# Patient Record
Sex: Male | Born: 1960 | Race: Black or African American | Hispanic: No | Marital: Single | Smoking: Current every day smoker
Health system: Southern US, Community
[De-identification: ages and names within clinical notes are randomized; demographics above are authoritative.]

## PROBLEM LIST (undated history)

## (undated) DIAGNOSIS — G4733 Obstructive sleep apnea (adult) (pediatric): Secondary | ICD-10-CM

## (undated) DIAGNOSIS — B1920 Unspecified viral hepatitis C without hepatic coma: Secondary | ICD-10-CM

## (undated) DIAGNOSIS — D696 Thrombocytopenia, unspecified: Secondary | ICD-10-CM

## (undated) DIAGNOSIS — E872 Acidosis, unspecified: Secondary | ICD-10-CM

## (undated) DIAGNOSIS — J189 Pneumonia, unspecified organism: Secondary | ICD-10-CM

## (undated) DIAGNOSIS — Z720 Tobacco use: Secondary | ICD-10-CM

## (undated) DIAGNOSIS — K8020 Calculus of gallbladder without cholecystitis without obstruction: Secondary | ICD-10-CM

## (undated) DIAGNOSIS — N183 Chronic kidney disease, stage 3 unspecified: Secondary | ICD-10-CM

## (undated) DIAGNOSIS — E119 Type 2 diabetes mellitus without complications: Secondary | ICD-10-CM

## (undated) DIAGNOSIS — K862 Cyst of pancreas: Secondary | ICD-10-CM

## (undated) DIAGNOSIS — B20 Human immunodeficiency virus [HIV] disease: Secondary | ICD-10-CM

## (undated) DIAGNOSIS — J449 Chronic obstructive pulmonary disease, unspecified: Secondary | ICD-10-CM

## (undated) DIAGNOSIS — E1140 Type 2 diabetes mellitus with diabetic neuropathy, unspecified: Secondary | ICD-10-CM

## (undated) DIAGNOSIS — Z789 Other specified health status: Secondary | ICD-10-CM

## (undated) DIAGNOSIS — G939 Disorder of brain, unspecified: Secondary | ICD-10-CM

## (undated) DIAGNOSIS — R4182 Altered mental status, unspecified: Secondary | ICD-10-CM

## (undated) DIAGNOSIS — R2681 Unsteadiness on feet: Secondary | ICD-10-CM

## (undated) DIAGNOSIS — D490 Neoplasm of unspecified behavior of digestive system: Secondary | ICD-10-CM

## (undated) DIAGNOSIS — R972 Elevated prostate specific antigen [PSA]: Secondary | ICD-10-CM

## (undated) DIAGNOSIS — F1491 Cocaine use, unspecified, in remission: Secondary | ICD-10-CM

## (undated) DIAGNOSIS — Z87898 Personal history of other specified conditions: Secondary | ICD-10-CM

## (undated) DIAGNOSIS — E1110 Type 2 diabetes mellitus with ketoacidosis without coma: Secondary | ICD-10-CM

## (undated) DIAGNOSIS — Z978 Presence of other specified devices: Secondary | ICD-10-CM

## (undated) DIAGNOSIS — N179 Acute kidney failure, unspecified: Secondary | ICD-10-CM

## (undated) DIAGNOSIS — I10 Essential (primary) hypertension: Secondary | ICD-10-CM

## (undated) DIAGNOSIS — Z21 Asymptomatic human immunodeficiency virus [HIV] infection status: Secondary | ICD-10-CM

## (undated) DIAGNOSIS — D649 Anemia, unspecified: Secondary | ICD-10-CM

## (undated) HISTORY — PX: NO PAST SURGERIES: SHX2092

---

## 1997-06-11 ENCOUNTER — Encounter: Admission: RE | Admit: 1997-06-11 | Discharge: 1997-06-11 | Payer: Self-pay | Admitting: Infectious Diseases

## 1997-08-02 ENCOUNTER — Encounter: Admission: RE | Admit: 1997-08-02 | Discharge: 1997-08-02 | Payer: Self-pay | Admitting: Internal Medicine

## 1997-10-19 ENCOUNTER — Encounter: Admission: RE | Admit: 1997-10-19 | Discharge: 1997-10-19 | Payer: Self-pay | Admitting: Internal Medicine

## 1998-05-30 ENCOUNTER — Ambulatory Visit (HOSPITAL_COMMUNITY): Admission: RE | Admit: 1998-05-30 | Discharge: 1998-05-30 | Payer: Self-pay | Admitting: Internal Medicine

## 1998-07-11 ENCOUNTER — Encounter: Admission: RE | Admit: 1998-07-11 | Discharge: 1998-07-11 | Payer: Self-pay | Admitting: Internal Medicine

## 1998-08-08 ENCOUNTER — Encounter: Admission: RE | Admit: 1998-08-08 | Discharge: 1998-08-08 | Payer: Self-pay | Admitting: Internal Medicine

## 1998-08-17 ENCOUNTER — Encounter: Admission: RE | Admit: 1998-08-17 | Discharge: 1998-08-17 | Payer: Self-pay | Admitting: Infectious Diseases

## 1998-08-24 ENCOUNTER — Encounter: Admission: RE | Admit: 1998-08-24 | Discharge: 1998-08-24 | Payer: Self-pay | Admitting: Internal Medicine

## 1998-09-19 ENCOUNTER — Encounter: Admission: RE | Admit: 1998-09-19 | Discharge: 1998-09-19 | Payer: Self-pay | Admitting: Internal Medicine

## 1998-09-19 ENCOUNTER — Ambulatory Visit (HOSPITAL_COMMUNITY): Admission: RE | Admit: 1998-09-19 | Discharge: 1998-09-19 | Payer: Self-pay | Admitting: Internal Medicine

## 1998-10-03 ENCOUNTER — Encounter: Admission: RE | Admit: 1998-10-03 | Discharge: 1998-10-03 | Payer: Self-pay | Admitting: Internal Medicine

## 1998-11-14 ENCOUNTER — Ambulatory Visit (HOSPITAL_COMMUNITY): Admission: RE | Admit: 1998-11-14 | Discharge: 1998-11-14 | Payer: Self-pay | Admitting: Internal Medicine

## 1998-11-14 ENCOUNTER — Encounter: Admission: RE | Admit: 1998-11-14 | Discharge: 1998-11-14 | Payer: Self-pay | Admitting: Internal Medicine

## 1999-03-06 ENCOUNTER — Ambulatory Visit (HOSPITAL_COMMUNITY): Admission: RE | Admit: 1999-03-06 | Discharge: 1999-03-06 | Payer: Self-pay | Admitting: Internal Medicine

## 1999-03-06 ENCOUNTER — Encounter: Admission: RE | Admit: 1999-03-06 | Discharge: 1999-03-06 | Payer: Self-pay | Admitting: Internal Medicine

## 1999-05-22 ENCOUNTER — Ambulatory Visit (HOSPITAL_COMMUNITY): Admission: RE | Admit: 1999-05-22 | Discharge: 1999-05-22 | Payer: Self-pay | Admitting: Internal Medicine

## 1999-05-22 ENCOUNTER — Encounter: Admission: RE | Admit: 1999-05-22 | Discharge: 1999-05-22 | Payer: Self-pay | Admitting: Internal Medicine

## 1999-06-05 ENCOUNTER — Encounter: Admission: RE | Admit: 1999-06-05 | Discharge: 1999-06-05 | Payer: Self-pay | Admitting: Internal Medicine

## 1999-06-27 ENCOUNTER — Encounter: Admission: RE | Admit: 1999-06-27 | Discharge: 1999-06-27 | Payer: Self-pay | Admitting: Internal Medicine

## 1999-08-28 ENCOUNTER — Encounter: Admission: RE | Admit: 1999-08-28 | Discharge: 1999-08-28 | Payer: Self-pay | Admitting: Internal Medicine

## 2002-06-09 ENCOUNTER — Encounter: Admission: RE | Admit: 2002-06-09 | Discharge: 2002-06-09 | Payer: Self-pay | Admitting: Infectious Diseases

## 2002-06-09 ENCOUNTER — Encounter: Payer: Self-pay | Admitting: Infectious Diseases

## 2002-09-22 ENCOUNTER — Encounter: Payer: Self-pay | Admitting: Infectious Diseases

## 2002-09-22 ENCOUNTER — Encounter: Admission: RE | Admit: 2002-09-22 | Discharge: 2002-09-22 | Payer: Self-pay | Admitting: Infectious Diseases

## 2002-09-22 ENCOUNTER — Ambulatory Visit (HOSPITAL_COMMUNITY): Admission: RE | Admit: 2002-09-22 | Discharge: 2002-09-22 | Payer: Self-pay | Admitting: Infectious Diseases

## 2006-04-05 ENCOUNTER — Ambulatory Visit (HOSPITAL_COMMUNITY): Admission: RE | Admit: 2006-04-05 | Discharge: 2006-04-05 | Payer: Self-pay | Admitting: Family Medicine

## 2008-12-06 ENCOUNTER — Ambulatory Visit: Payer: Self-pay | Admitting: Urology

## 2008-12-21 ENCOUNTER — Ambulatory Visit: Payer: Self-pay | Admitting: Specialist

## 2009-05-21 ENCOUNTER — Emergency Department (HOSPITAL_COMMUNITY): Admission: EM | Admit: 2009-05-21 | Discharge: 2009-05-21 | Payer: Self-pay | Admitting: Emergency Medicine

## 2009-07-18 ENCOUNTER — Encounter (HOSPITAL_COMMUNITY): Admission: RE | Admit: 2009-07-18 | Discharge: 2009-08-17 | Payer: Self-pay | Admitting: Orthopaedic Surgery

## 2011-10-05 DIAGNOSIS — B1920 Unspecified viral hepatitis C without hepatic coma: Secondary | ICD-10-CM | POA: Insufficient documentation

## 2012-01-20 DIAGNOSIS — R4182 Altered mental status, unspecified: Secondary | ICD-10-CM

## 2012-01-20 HISTORY — DX: Altered mental status, unspecified: R41.82

## 2012-01-22 ENCOUNTER — Emergency Department (HOSPITAL_COMMUNITY): Payer: Medicaid Other

## 2012-01-22 ENCOUNTER — Inpatient Hospital Stay (HOSPITAL_COMMUNITY)
Admission: EM | Admit: 2012-01-22 | Discharge: 2012-01-29 | DRG: 974 | Disposition: A | Discharge status: Home Health | Payer: Medicaid Other | Attending: Internal Medicine | Admitting: Internal Medicine

## 2012-01-22 ENCOUNTER — Encounter (HOSPITAL_COMMUNITY): Payer: Self-pay

## 2012-01-22 DIAGNOSIS — R29898 Other symptoms and signs involving the musculoskeletal system: Secondary | ICD-10-CM | POA: Diagnosis present

## 2012-01-22 DIAGNOSIS — J189 Pneumonia, unspecified organism: Principal | ICD-10-CM | POA: Diagnosis present

## 2012-01-22 DIAGNOSIS — G9389 Other specified disorders of brain: Secondary | ICD-10-CM | POA: Diagnosis present

## 2012-01-22 DIAGNOSIS — Z91199 Patient's noncompliance with other medical treatment and regimen due to unspecified reason: Secondary | ICD-10-CM

## 2012-01-22 DIAGNOSIS — G9340 Encephalopathy, unspecified: Secondary | ICD-10-CM | POA: Diagnosis present

## 2012-01-22 DIAGNOSIS — Z9119 Patient's noncompliance with other medical treatment and regimen: Secondary | ICD-10-CM

## 2012-01-22 DIAGNOSIS — B20 Human immunodeficiency virus [HIV] disease: Secondary | ICD-10-CM | POA: Diagnosis present

## 2012-01-22 DIAGNOSIS — N179 Acute kidney failure, unspecified: Secondary | ICD-10-CM | POA: Diagnosis present

## 2012-01-22 DIAGNOSIS — N189 Chronic kidney disease, unspecified: Secondary | ICD-10-CM | POA: Diagnosis present

## 2012-01-22 DIAGNOSIS — E876 Hypokalemia: Secondary | ICD-10-CM | POA: Diagnosis present

## 2012-01-22 DIAGNOSIS — D649 Anemia, unspecified: Secondary | ICD-10-CM | POA: Diagnosis present

## 2012-01-22 DIAGNOSIS — R4182 Altered mental status, unspecified: Secondary | ICD-10-CM | POA: Diagnosis present

## 2012-01-22 DIAGNOSIS — E872 Acidosis, unspecified: Secondary | ICD-10-CM

## 2012-01-22 DIAGNOSIS — B351 Tinea unguium: Secondary | ICD-10-CM | POA: Diagnosis present

## 2012-01-22 DIAGNOSIS — Z21 Asymptomatic human immunodeficiency virus [HIV] infection status: Secondary | ICD-10-CM | POA: Diagnosis present

## 2012-01-22 DIAGNOSIS — E43 Unspecified severe protein-calorie malnutrition: Secondary | ICD-10-CM | POA: Diagnosis present

## 2012-01-22 DIAGNOSIS — G939 Disorder of brain, unspecified: Secondary | ICD-10-CM | POA: Diagnosis present

## 2012-01-22 DIAGNOSIS — Z681 Body mass index (BMI) 19 or less, adult: Secondary | ICD-10-CM

## 2012-01-22 HISTORY — DX: Human immunodeficiency virus (HIV) disease: B20

## 2012-01-22 HISTORY — DX: Asymptomatic human immunodeficiency virus (hiv) infection status: Z21

## 2012-01-22 HISTORY — DX: Altered mental status, unspecified: R41.82

## 2012-01-22 HISTORY — DX: Acute kidney failure, unspecified: N17.9

## 2012-01-22 LAB — COMPREHENSIVE METABOLIC PANEL
ALT: 24 U/L (ref 0–53)
AST: 50 U/L — ABNORMAL HIGH (ref 0–37)
Alkaline Phosphatase: 65 U/L (ref 39–117)
Chloride: 110 mEq/L (ref 96–112)
GFR calc non Af Amer: 47 mL/min — ABNORMAL LOW (ref >90)
Glucose, Bld: 93 mg/dL (ref 70–99)
Potassium: 3.6 mEq/L (ref 3.5–5.1)
Total Bilirubin: 0.1 mg/dL — ABNORMAL LOW (ref 0.3–1.2)

## 2012-01-22 LAB — POCT I-STAT, CHEM 8
BUN: 29 mg/dL — ABNORMAL HIGH (ref 6–23)
Calcium, Ion: 1.15 mmol/L (ref 1.12–1.23)
Chloride: 113 mEq/L — ABNORMAL HIGH (ref 96–112)
Glucose, Bld: 87 mg/dL (ref 70–99)
HCT: 27 % — ABNORMAL LOW (ref 39.0–52.0)
Potassium: 3.6 mEq/L (ref 3.5–5.1)
Sodium: 142 mEq/L (ref 135–145)
TCO2: 19 mmol/L (ref 0–100)

## 2012-01-22 LAB — CBC
HCT: 27.6 % — ABNORMAL LOW (ref 39.0–52.0)
Hemoglobin: 9.4 g/dL — ABNORMAL LOW (ref 13.0–17.0)
MCH: 29.8 pg (ref 26.0–34.0)
MCV: 87.6 fL (ref 78.0–100.0)
Platelets: 125 10*3/uL — ABNORMAL LOW (ref 150–400)
RBC: 3.15 MIL/uL — ABNORMAL LOW (ref 4.22–5.81)
WBC: 2.9 10*3/uL — ABNORMAL LOW (ref 4.0–10.5)

## 2012-01-22 LAB — DIFFERENTIAL
Basophils Absolute: 0 10*3/uL (ref 0.0–0.1)
Eosinophils Absolute: 0 10*3/uL (ref 0.0–0.7)
Eosinophils Relative: 0 % (ref 0–5)
Neutro Abs: 1.5 10*3/uL — ABNORMAL LOW (ref 1.7–7.7)
Neutrophils Relative %: 52 % (ref 43–77)

## 2012-01-22 LAB — RAPID URINE DRUG SCREEN, HOSP PERFORMED
Barbiturates: NOT DETECTED
Benzodiazepines: NOT DETECTED
Cocaine: NOT DETECTED
Opiates: NOT DETECTED

## 2012-01-22 LAB — LACTIC ACID, PLASMA: Lactic Acid, Venous: 1.3 mmol/L (ref 0.5–2.2)

## 2012-01-22 LAB — GLUCOSE, CAPILLARY: Glucose-Capillary: 84 mg/dL (ref 70–99)

## 2012-01-22 MED ORDER — SODIUM CHLORIDE 0.9 % IJ SOLN
3.0000 mL | Freq: Two times a day (BID) | INTRAMUSCULAR | Status: DC
  Administered 2012-01-22 – 2012-01-29 (×7): 3 mL via INTRAVENOUS

## 2012-01-22 MED ORDER — SODIUM CHLORIDE 0.9 % IV SOLN: INTRAVENOUS | Status: DC

## 2012-01-22 MED ORDER — SODIUM CHLORIDE 0.9 % IV SOLN
INTRAVENOUS | Status: AC
  Administered 2012-01-22: 23:00:00 via INTRAVENOUS

## 2012-01-22 MED ORDER — SULFAMETHOXAZOLE-TRIMETHOPRIM 400-80 MG/5ML IV SOLN
340.0000 mg | Freq: Three times a day (TID) | INTRAVENOUS | Status: DC
  Administered 2012-01-23 – 2012-01-24 (×4): 340 mg via INTRAVENOUS
  Filled 2012-01-22 (×9): qty 21.3

## 2012-01-22 MED ORDER — LABETALOL HCL 5 MG/ML IV SOLN
5.0000 mg | INTRAVENOUS | Status: DC | PRN
  Administered 2012-01-23 – 2012-01-24 (×2): 5 mg via INTRAVENOUS
  Filled 2012-01-22 (×2): qty 4

## 2012-01-22 MED ORDER — CLONIDINE HCL 0.1 MG PO TABS
0.1000 mg | ORAL_TABLET | Freq: Once | ORAL | Status: AC
  Administered 2012-01-22: 0.1 mg via ORAL
  Filled 2012-01-22: qty 1

## 2012-01-22 MED ORDER — LEVOFLOXACIN IN D5W 750 MG/150ML IV SOLN
750.0000 mg | INTRAVENOUS | Status: DC
  Administered 2012-01-22 – 2012-01-24 (×2): 750 mg via INTRAVENOUS
  Filled 2012-01-22 (×3): qty 150

## 2012-01-22 MED ORDER — EFAVIRENZ-EMTRICITAB-TENOFOVIR 600-200-300 MG PO TABS
1.0000 | ORAL_TABLET | Freq: Every day | ORAL | Status: DC
  Administered 2012-01-22: 1 via ORAL
  Filled 2012-01-22 (×2): qty 1

## 2012-01-22 NOTE — ED Notes (Signed)
Sister states when she checked on her brother Sunday after church he was confused and his mouth was drawn. Also, complain of pain in right ankle today without injury

## 2012-01-22 NOTE — ED Notes (Signed)
[facility] called and received report, ETA of 10-15 minutes

## 2012-01-22 NOTE — ED Notes (Signed)
I called [facility] with bed assignment ([id number]).  Will send a truck.

## 2012-01-22 NOTE — ED Notes (Signed)
Patient transported to X-ray 

## 2012-01-22 NOTE — ED Notes (Signed)
Pt's sister at bedside, states another sister reported that pt had been confused and mouth drawn to side after church on Sunday

## 2012-01-22 NOTE — Progress Notes (Signed)
Spoke with Dr. Ignacia Palma at [facility] ED regarding transfer of this patient to [facility].  51 year old male with a history of HIV, noncompliant with medications presents with two-day history of worsening leg weakness, right greater than left. The patient's sister was with the patient and claims that the patient is confused. However, the EDP stated that patient was awake, alert, and able to provide a history.  CT brain without contrast showed bilateral thalamic, basal ganglia and periventricular hypodensities. There was also focal area of decreased attenuation in the mid pons region. The EDP consulted with a teleneurologist whom advised admission for MRI brain and neurology consult and possible ID consult.  The teleneurologist did not feel that an LP was imminently necessary at this time.  Patient was alert and also able to speak with teleneurologist.  Patient was afebrile and hemodynamically stable. In fact, patient was hypertensive with a blood pressure 179/107. Chest x-ray showed bronchitic type changes with mild bilateral perihilar infiltrates.  No abx given.  Patient did not have dyspnea or hypoxemia. EKG showed sinus rhythm. Patient was accepted for telemetry bed.  DTat

## 2012-01-22 NOTE — Progress Notes (Signed)
Patient arrived via [facility] from [facility]. Patient resting comfortably in room.  MD paged and notified of patient's arrival to unit. Nolon Nations

## 2012-01-22 NOTE — H&P (Signed)
Chief Complaint:  ams  HPI: 51 yo male hiv positive bought to [employer] ed for ams by his sister.  Pt mental status normal now.  He denies any fevers.  Has been having nonprod cough for several days.  No rashes.  No n/v/d.  He is complaining of mild le ankle edema that waxes/wanes but is resolved now.  No significant calf swelling.  No cp.  No sob.  Says he takes his hiv meds and is compliant but there is some mention elsewhere in chart that this is not the case.  Transferred here to ED with ct head showing multiple hypodense lesions and cxr with bilateral infiltrates.    Review of Systems:  O/w neg  Past Medical History: Past Medical History  Diagnosis Date  . HIV (human immunodeficiency virus infection)    History reviewed. No pertinent past surgical history.  Medications: Prior to Admission medications   Medication Sig Start Date End Date Taking? Authorizing Provider  efavirenz-emtricitabine-tenofovir (ATRIPLA) 600-200-300 MG per tablet Take 1 tablet by mouth at bedtime.   Yes Historical Provider, MD    Allergies:  No Known Allergies  Social History:  reports that he has been smoking.  He does not have any smokeless tobacco history on file. He reports that he uses illicit drugs (Cocaine). He reports that he does not drink alcohol.  Family History: History reviewed. No pertinent family history.  Physical Exam: Filed Vitals:   01/22/12 1704 01/22/12 1730 01/22/12 1745 01/22/12 1856  BP: 190/113  169/104 174/107  Pulse: 80 80 81 74  Temp:    97.7 F (36.5 C)  TempSrc:    Oral  Resp:    14  Height:      Weight:      SpO2: 100% 100% 100% 100%   General appearance: alert, cooperative and no distress Neck: no JVD and supple, symmetrical, trachea midline Lungs: clear to auscultation bilaterally Heart: regular rate and rhythm, S1, S2 normal, no murmur, click, rub or gallop Abdomen: soft, non-tender; bowel sounds normal; no masses,  no organomegaly Extremities: extremities  normal, atraumatic, no cyanosis or edema Pulses: 2+ and symmetric Skin: Skin color, texture, turgor normal. No rashes or lesions Neurologic: Grossly normal    Labs on Admission:   [facility] 01/22/12 1406 01/22/12 1335  NA 142 136  K 3.6 3.6  CL 113* 110  CO2 -- 21  GLUCOSE 87 93  BUN 29* 28*  CREATININE 1.60* 1.66*  CALCIUM -- 7.8*  MG -- --  PHOS -- --    Basename 01/22/12 1335  AST 50*  ALT 24  ALKPHOS 65  BILITOT 0.1*  PROT 7.9  ALBUMIN 1.6*     Basename 01/22/12 1335  CKTOTAL --  CKMB --  CKMBINDEX --  TROPONINI <0.30    Radiological Exams on Admission: Dg Chest 2 View  01/22/2012  *RADIOLOGY REPORT*  Clinical Data: Altered mental status, right leg weakness, history HIV  CHEST - 2 VIEW  Comparison: None  Findings: Borderline enlargement of cardiac silhouette. Mediastinal contours normal. Peribronchial thickening with suspected mild perihilar infiltrates. No segmental consolidation or pneumothorax. Tiny bibasilar effusions. Bones unremarkable.  IMPRESSION: Bronchitic changes with suspected mild bilateral perihilar infiltrates.   Original Report Authenticated By: Ulyses Southward, M.D.    Ct Head Wo Contrast  01/22/2012  *RADIOLOGY REPORT*  Clinical Data: Confusion.  Altered mental status. Symptoms began Sunday.  HIV positive.  Right leg weakness.  CT HEAD WITHOUT CONTRAST  Technique:  Contiguous axial images were obtained  from the base of the skull through the vertex without contrast.  Comparison: None.  Findings: There is no evidence for acute infarction, intracranial hemorrhage,   hydrocephalus, or extra-axial fluid. Premature atrophy affects the cerebral hemispheres and cerebellar white matter.  Hypodensities are noted in the bilateral thalami, left greater than right basal ganglia, and periventricular white matter. Cryptococcal infection of the brain can be associated with a somewhat similar pattern, although multiple lacunar infarcts are certainly a more common  occurrence in non-HIV patients.  There is a rectangular like area of decreased attenuation in the mid pons of uncertain significance.  Osmotic demyelination syndrome could explain this finding in the appropriate clinical setting versus an inflammatory, infectious, or ischemic etiology.    If there are no contraindications, MRI brain without and with contrast could be helpful in further evaluation.  Calvarium intact.  Clear sinuses and mastoids.  Dense lenticular opacities without visible thickening of the retina/choroid.  IMPRESSION: Bilateral thalamic, basal ganglia, and periventricular hypodensities in this HIV positive patient; opportunistic infection not excluded.  Focal area of decreased attenuation in the mid pons, also of uncertain significance.  See discussion above.  Premature cerebral and cerebellar atrophy.   Original Report Authenticated By: Davonna Belling, M.D.     Assessment/Plan  51 yo male hiv + with pna and multiple brain lesions  Principal Problem:  *PNA (pneumonia) Active Problems:  HIV (human immunodeficiency virus infection)  Brain lesion  Acute renal failure  Anemia  Altered mental status  Spoke to ID dr Orvan Falconer and will place pt on iv septra and levaquin tonight.  Will ck ua and get one set of blood cultures.  Obtain mri of brain to better look at lesions.  ivf overnight and repeat cr in am no prior baseline.  Obtain cd4 count and hiv viral load.  Anemia panel also.  Vss.  Id will see in am.  Mental status is clear now and no focal neurological deficits.  Full code.     Felisa Zechman A 01/22/2012, 9:15 PM

## 2012-01-22 NOTE — ED Provider Notes (Signed)
History   This chart was scribed for Carleene Cooper III, MD by Sofie Rower, ED Scribe. The patient was seen in room [facility] and the patient's care was started at 1:09PM.     CSN: [id number]  Arrival date & time 01/22/12  1221   First MD Initiated Contact with Patient 01/22/12 1309      Chief Complaint  Patient presents with  . Altered Mental Status    (Consider location/radiation/quality/duration/timing/severity/associated sxs/prior treatment) The history is provided by the patient and a relative. No language interpreter was used.    Jay Flores is a 51 y.o. male , with a hx of HIV, who presents to the Emergency Department complaining of sudden, progressively worsening, altered mental status, onset two days ago (01/20/12).  Associated symptoms include ankle pain located at the right ankle. The pt reports he has been having trouble ambulating and experiencing sudden episodes of his right leg and right ankle giving out from under him. The pt usually uses a walker to assist with his ambulation at home. The pt's relative informs she is concerned to due to the possibility of the pt having experienced a stroke. The pt reports he has experienced similar symptoms in the past, when he was diagnosed with HIV. The pt does have Medications to manage his HIV, however, he has been abstaining from taking his scheduled dosage.   The pt denies rash, difficulty urinating, and dysuria. Furthermore, the pt denies any pertinent surgical hx and allergies to medications.   The pt is a current everyday smoker, however, he does not drink alcohol. The pt does use cocaine.   Past Medical History  Diagnosis Date  . HIV (human immunodeficiency virus infection)     History reviewed. No pertinent past surgical history.  No family history on file.  History  Substance Use Topics  . Smoking status: Current Every Day Smoker  . Smokeless tobacco: Not on file  . Alcohol Use: No      Review of Systems   Genitourinary: Negative for dysuria and difficulty urinating.  Skin: Negative for rash.  Psychiatric/Behavioral: Positive for confusion.  All other systems reviewed and are negative.    Allergies  Review of patient's allergies indicates no known allergies.  Home Medications   Current Outpatient Rx  Name  Route  Sig  Dispense  Refill  . EFAVIRENZ-EMTRICITAB-TENOFOVIR 600-200-300 MG PO TABS   Oral   Take 1 tablet by mouth at bedtime.           BP 174/103  Pulse 88  Temp 97.6 F (36.4 C) (Oral)  Resp 21  Ht 5\' 11"  (1.803 m)  Wt 147 lb (66.679 kg)  BMI 20.50 kg/m2  SpO2 100%  Physical Exam  Nursing note and vitals reviewed. Constitutional: He is oriented to person, place, and time. He appears well-developed and well-nourished. No distress.       Emaciated.   HENT:  Head: Normocephalic and atraumatic.  Right Ear: Tympanic membrane and external ear normal.  Left Ear: Tympanic membrane and external ear normal.  Nose: Nose normal.  Eyes: Conjunctivae normal and EOM are normal. Pupils are equal, round, and reactive to light.  Neck: Normal range of motion. Neck supple. No tracheal deviation present.  Cardiovascular: Normal rate, regular rhythm and normal heart sounds.   Pulmonary/Chest: Effort normal and breath sounds normal. No respiratory distress.  Abdominal: Soft. Bowel sounds are normal. He exhibits no distension and no mass. There is no tenderness.  Musculoskeletal: Normal range of motion. He  exhibits no edema.       Onychomycosis of all toe nails, severe. Pt has bilateral, symmetric weakness in the lower extremities.   Neurological: He is alert and oriented to person, place, and time.  Skin: Skin is warm and dry.  Psychiatric: He has a normal mood and affect. His behavior is normal.    ED Course  Procedures (including critical care time)  DIAGNOSTIC STUDIES: Oxygen Saturation is 100% on room air, normal by my interpretation.    COORDINATION OF CARE:  1:34  PM- Treatment plan discussed with patient. Pt agrees with treatment.  1:52 PM- Recheck. Treatment plan concerning evaluation by neurologist discussed with patients sister. Pt's sister agrees with treatment.   2:24 PM Spoke to teleneurogist about Jay Flores.  2:54 PM  Results for orders placed during the hospital encounter of 01/22/12  PROTIME-INR      Component Value Range   Prothrombin Time 12.8  11.6 - 15.2 seconds   INR 0.97  0.00 - 1.49  APTT      Component Value Range   aPTT 43 (*) 24 - 37 seconds  CBC      Component Value Range   WBC 2.9 (*) 4.0 - 10.5 K/uL   RBC 3.15 (*) 4.22 - 5.81 MIL/uL   Hemoglobin 9.4 (*) 13.0 - 17.0 g/dL   HCT 16.1 (*) 09.6 - 04.5 %   MCV 87.6  78.0 - 100.0 fL   MCH 29.8  26.0 - 34.0 pg   MCHC 34.1  30.0 - 36.0 g/dL   RDW 40.9  81.1 - 91.4 %   Platelets 125 (*) 150 - 400 K/uL  DIFFERENTIAL      Component Value Range   Neutrophils Relative 52  43 - 77 %   Neutro Abs 1.5 (*) 1.7 - 7.7 K/uL   Lymphocytes Relative 37  12 - 46 %   Lymphs Abs 1.1  0.7 - 4.0 K/uL   Monocytes Relative 11  3 - 12 %   Monocytes Absolute 0.3  0.1 - 1.0 K/uL   Eosinophils Relative 0  0 - 5 %   Eosinophils Absolute 0.0  0.0 - 0.7 K/uL   Basophils Relative 0  0 - 1 %   Basophils Absolute 0.0  0.0 - 0.1 K/uL  COMPREHENSIVE METABOLIC PANEL      Component Value Range   Sodium 136  135 - 145 mEq/L   Potassium 3.6  3.5 - 5.1 mEq/L   Chloride 110  96 - 112 mEq/L   CO2 21  19 - 32 mEq/L   Glucose, Bld 93  70 - 99 mg/dL   BUN 28 (*) 6 - 23 mg/dL   Creatinine, Ser 7.82 (*) 0.50 - 1.35 mg/dL   Calcium 7.8 (*) 8.4 - 10.5 mg/dL   Total Protein 7.9  6.0 - 8.3 g/dL   Albumin 1.6 (*) 3.5 - 5.2 g/dL   AST 50 (*) 0 - 37 U/L   ALT 24  0 - 53 U/L   Alkaline Phosphatase 65  39 - 117 U/L   Total Bilirubin 0.1 (*) 0.3 - 1.2 mg/dL   GFR calc non Af Amer 47 (*) >90 mL/min   GFR calc Af Amer 54 (*) >90 mL/min  TROPONIN I      Component Value Range   Troponin I <0.30  <0.30 ng/mL   POCT I-STAT, CHEM 8      Component Value Range   Sodium 142  135 - 145 mEq/L  Potassium 3.6  3.5 - 5.1 mEq/L   Chloride 113 (*) 96 - 112 mEq/L   BUN 29 (*) 6 - 23 mg/dL   Creatinine, Ser 1.61 (*) 0.50 - 1.35 mg/dL   Glucose, Bld 87  70 - 99 mg/dL   Calcium, Ion 0.96  0.45 - 1.23 mmol/L   TCO2 19  0 - 100 mmol/L   Hemoglobin 9.2 (*) 13.0 - 17.0 g/dL   HCT 40.9 (*) 81.1 - 91.4 %  GLUCOSE, CAPILLARY      Component Value Range   Glucose-Capillary 84  70 - 99 mg/dL   Dg Chest 2 View  78/29/5621  *RADIOLOGY REPORT*  Clinical Data: Altered mental status, right leg weakness, history HIV  CHEST - 2 VIEW  Comparison: None  Findings: Borderline enlargement of cardiac silhouette. Mediastinal contours normal. Peribronchial thickening with suspected mild perihilar infiltrates. No segmental consolidation or pneumothorax. Tiny bibasilar effusions. Bones unremarkable.  IMPRESSION: Bronchitic changes with suspected mild bilateral perihilar infiltrates.   Original Report Authenticated By: Ulyses Southward, M.D.    Ct Head Wo Contrast  01/22/2012  *RADIOLOGY REPORT*  Clinical Data: Confusion.  Altered mental status. Symptoms began Sunday.  HIV positive.  Right leg weakness.  CT HEAD WITHOUT CONTRAST  Technique:  Contiguous axial images were obtained from the base of the skull through the vertex without contrast.  Comparison: None.  Findings: There is no evidence for acute infarction, intracranial hemorrhage,   hydrocephalus, or extra-axial fluid. Premature atrophy affects the cerebral hemispheres and cerebellar white matter.  Hypodensities are noted in the bilateral thalami, left greater than right basal ganglia, and periventricular white matter. Cryptococcal infection of the brain can be associated with a somewhat similar pattern, although multiple lacunar infarcts are certainly a more common occurrence in non-HIV patients.  There is a rectangular like area of decreased attenuation in the mid pons of uncertain  significance.  Osmotic demyelination syndrome could explain this finding in the appropriate clinical setting versus an inflammatory, infectious, or ischemic etiology.    If there are no contraindications, MRI brain without and with contrast could be helpful in further evaluation.  Calvarium intact.  Clear sinuses and mastoids.  Dense lenticular opacities without visible thickening of the retina/choroid.  IMPRESSION: Bilateral thalamic, basal ganglia, and periventricular hypodensities in this HIV positive patient; opportunistic infection not excluded.  Focal area of decreased attenuation in the mid pons, also of uncertain significance.  See discussion above.  Premature cerebral and cerebellar atrophy.   Original Report Authenticated By: Davonna Belling, M.D.        2:54 PM Awaiting teleneurology evaluation note.  Lab tests show low WBC of 2900 with normal diff, mild anemia with Hb 9,4 and Hct 27.6,mildly abnormal renal function.  3:07 PM Teleneurology consultant recommends admission, MRI brain with and without gadolinium.  Will call [facility] to admit him.  3:20PM- Phone consultation with Dr. Doristine Section, whom recommends the pt be evaluated by infectious disease.  3:33PM- Recheck. Treatment plan concerning transfer to [facility] discussed with pt. Pt agrees with treatment.   4:50 PM Case discussed with Catarina Hartshorn, M.D., who accepts pt in transfer to [facility] to a telemetry bed.   Will give Clonidine 0.1 mg po because of BP high, with diastolic 117 when I discussed his transfer with him.      1. Lower extremity weakness   2. HIV (human immunodeficiency virus infection)   3. Acute renal failure   4. Altered mental status   5.  Anemia   6. Brain lesion   7. PNA (pneumonia)     I personally performed the services described in this documentation, which was scribed in my presence. The recorded information has been reviewed and is accurate.  Osvaldo Human,  MD      Carleene Cooper III, MD 01/23/12 [phone]

## 2012-01-22 NOTE — ED Notes (Signed)
Patient transported to CT 

## 2012-01-22 NOTE — ED Notes (Signed)
MD at bedside. 

## 2012-01-22 NOTE — Progress Notes (Signed)
ANTIBIOTIC CONSULT NOTE - INITIAL  Pharmacy Consult for septra and levofloxacin Indication: PCP PNA with brain lesions  No Known Allergies  Patient Measurements: Height: 5\' 11"  (180.3 cm) Weight: 147 lb (66.679 kg) IBW/kg (Calculated) : 75.3    Vital Signs: Temp: 97.7 F (36.5 C) (12/17 1856) Temp src: Oral (12/17 1856) BP: 174/107 mmHg (12/17 1856) Pulse Rate: 74  (12/17 1856) Intake/Output from previous day:   Intake/Output from this shift:    Labs:  Basename 01/22/12 1406 01/22/12 1335  WBC -- 2.9*  HGB 9.2* 9.4*  PLT -- 125*  LABCREA -- --  CREATININE 1.60* 1.66*   Estimated Creatinine Clearance: 52.1 ml/min (by C-G formula based on Cr of 1.6). No results found for this basename: VANCOTROUGH:2,VANCOPEAK:2,VANCORANDOM:2,GENTTROUGH:2,GENTPEAK:2,GENTRANDOM:2,TOBRATROUGH:2,TOBRAPEAK:2,TOBRARND:2,AMIKACINPEAK:2,AMIKACINTROU:2,AMIKACIN:2, in the last 72 hours   Microbiology: No results found for this or any previous visit (from the past 720 hour(s)).  Medical History: Past Medical History  Diagnosis Date  . HIV (human immunodeficiency virus infection)     Medications:  Prescriptions prior to admission  Medication Sig Dispense Refill  . efavirenz-emtricitabine-tenofovir (ATRIPLA) 600-200-300 MG per tablet Take 1 tablet by mouth at bedtime.       Assessment: 51 yo M transferred from [facility] with PCP PNA with brain lesions, h/o HIV, noncompliant with meds.  Presented with 2 day history of worsening leg weakness.   Goal of Therapy: Appropriate dosing.  Plan:  - Bactrim ~5 mg/kg IV q8h --> 340 mg IV q8h - Levofloxacin 750 mg IV q24h - Follow up SCr, UOP, cultures, clinical course and adjust as clinically indicated  Kelsei Defino L. Illene Bolus, PharmD, BCPS Clinical Pharmacist Pager: [phone] Pharmacy: [phone] 01/22/2012 9:43 PM

## 2012-01-23 ENCOUNTER — Inpatient Hospital Stay (HOSPITAL_COMMUNITY): Payer: Medicaid Other

## 2012-01-23 ENCOUNTER — Encounter (HOSPITAL_COMMUNITY): Payer: Self-pay | Admitting: Internal Medicine

## 2012-01-23 DIAGNOSIS — E876 Hypokalemia: Secondary | ICD-10-CM | POA: Diagnosis present

## 2012-01-23 DIAGNOSIS — J189 Pneumonia, unspecified organism: Secondary | ICD-10-CM

## 2012-01-23 LAB — BASIC METABOLIC PANEL
BUN: 29 mg/dL — ABNORMAL HIGH (ref 6–23)
CO2: 18 mEq/L — ABNORMAL LOW (ref 19–32)
Calcium: 8 mg/dL — ABNORMAL LOW (ref 8.4–10.5)
Chloride: 106 mEq/L (ref 96–112)
Creatinine, Ser: 1.54 mg/dL — ABNORMAL HIGH (ref 0.50–1.35)

## 2012-01-23 LAB — CBC
HCT: 27 % — ABNORMAL LOW (ref 39.0–52.0)
MCH: 29.4 pg (ref 26.0–34.0)
MCHC: 33.7 g/dL (ref 30.0–36.0)
MCV: 87.4 fL (ref 78.0–100.0)
RDW: 14.5 % (ref 11.5–15.5)

## 2012-01-23 LAB — URINALYSIS, ROUTINE W REFLEX MICROSCOPIC
Bilirubin Urine: NEGATIVE
Glucose, UA: NEGATIVE mg/dL
Ketones, ur: NEGATIVE mg/dL
Protein, ur: >300 mg/dL — AB

## 2012-01-23 LAB — POCT I-STAT TROPONIN I: Troponin i, poc: 0.12 ng/mL (ref 0.00–0.08)

## 2012-01-23 LAB — IRON AND TIBC
Iron: 67 ug/dL (ref 42–135)
TIBC: 186 ug/dL — ABNORMAL LOW (ref 215–435)

## 2012-01-23 LAB — CRYPTOCOCCAL ANTIGEN

## 2012-01-23 LAB — LACTATE DEHYDROGENASE: LDH: 240 U/L (ref 94–250)

## 2012-01-23 LAB — VITAMIN B12: Vitamin B-12: 522 pg/mL (ref 211–911)

## 2012-01-23 LAB — FOLATE: Folate: >20 ng/mL

## 2012-01-23 LAB — FERRITIN: Ferritin: 209 ng/mL (ref 22–322)

## 2012-01-23 LAB — URINE MICROSCOPIC-ADD ON

## 2012-01-23 MED ORDER — LORAZEPAM 2 MG/ML IJ SOLN
2.0000 mg | Freq: Once | INTRAMUSCULAR | Status: DC
  Filled 2012-01-23: qty 1

## 2012-01-23 MED ORDER — GADOBENATE DIMEGLUMINE 529 MG/ML IV SOLN
11.0000 mL | Freq: Once | INTRAVENOUS | Status: AC
  Administered 2012-01-23: 11 mL via INTRAVENOUS

## 2012-01-23 MED ORDER — NICOTINE 14 MG/24HR TD PT24
14.0000 mg | MEDICATED_PATCH | TRANSDERMAL | Status: DC
  Administered 2012-01-23 – 2012-01-28 (×6): 14 mg via TRANSDERMAL
  Filled 2012-01-23 (×7): qty 1

## 2012-01-23 MED ORDER — AMLODIPINE BESYLATE 5 MG PO TABS
5.0000 mg | ORAL_TABLET | Freq: Every day | ORAL | Status: DC
  Administered 2012-01-23 – 2012-01-29 (×6): 5 mg via ORAL
  Filled 2012-01-23 (×8): qty 1

## 2012-01-23 MED ORDER — LORAZEPAM 2 MG/ML IJ SOLN
1.0000 mg | Freq: Once | INTRAMUSCULAR | Status: AC
  Administered 2012-01-23: 1 mg via INTRAVENOUS

## 2012-01-23 MED ORDER — POTASSIUM CHLORIDE CRYS ER 20 MEQ PO TBCR
40.0000 meq | EXTENDED_RELEASE_TABLET | Freq: Once | ORAL | Status: AC
  Administered 2012-01-23: 40 meq via ORAL
  Filled 2012-01-23: qty 2

## 2012-01-23 NOTE — Progress Notes (Signed)
[facility] PROGRESS NOTE  Jay Flores ZOX:[id number] DOB: 11/29/1960 DOA: 01/22/2012 PCP: No primary provider on file.  Assessment/Plan PNA (pneumonia) : per chest xray. Afebrile. WC 3.4. Non-toxic appearing. Blood cultures pending. Continue IV septra and levaquin day #2 per ID. Confirming ID consultation. Appreciate their assistance Active Problems:  HIV (human immunodeficiency virus infection) : Per ID Appreciate assistance. CD4 and hiv viral load pending.  Brain lesion : MRI brain pending.  Acute renal failure : likely related to decreased po intake. Creatinine trending down this am.  Appears somewhat dry. Will continue with gentle hydration. Hold any nephrotoxins. Recheck in am.  HTN: poor control. Unclear if on home meds. Given clonidine last evening. Will start norvasc 5 mg and monitor.  Anemia : likely related to #2. TIBC 186, folate and ferritin WNL. No s/sx active bleeding. Will monitor Encephalopathy: infectious vs. Inflammatory vs infarct. Somewhat improved this am. MRI pending. Continue antibiotics,  cryptococcal antigen pending.  Hypokalemia: mild. Will replete and recheck in am.   Code Status: full Family Communication:  Disposition Plan: home with family when stable   Consultants:  ID  Procedures:  none  Antibiotics:  Septra 01/22/12>>>  levaquin 01/22/12>>>  HPI/Subjective: Awake alert oriented to self only. Cooperative. Eating breakfast  Objective: Filed Vitals:   01/23/12 0000 01/23/12 0200 01/23/12 0400 01/23/12 0800  BP: 138/123 168/98 151/108 167/104  Pulse: 86 80  91  Temp:  98.1 F (36.7 C)  97.7 F (36.5 C)  TempSrc:      Resp:    18  Height:      Weight:   56.382 kg (124 lb 4.8 oz)   SpO2: 100% 100% 100% 95%    Intake/Output Summary (Last 24 hours) at 01/23/12 0857 Last data filed at 01/23/12 0826  Gross per 24 hour  Intake    605 ml  Output    801 ml  Net   -196 ml   Filed Weights   01/22/12 1225 01/23/12 0400   Weight: 66.679 kg (147 lb) 56.382 kg (124 lb 4.8 oz)    Exam:   General:  Awake alert NAD  Cardiovascular: RRR No MGR No LEE PPP  Respiratory: normal effort. Rhonchi right base and diminished on left base. No wheeze/rale  Abdomen: soft +BS non-tender to palpaation  Neuro: alert oriented to self only. Speech clear facial symmetry. Cranial nerve II-XII intact  Data Reviewed: Basic Metabolic Panel:  Lab 01/23/12 8119 01/22/12 1406 01/22/12 1335  NA 136 142 136  K 3.3* 3.6 3.6  CL 106 113* 110  CO2 18* -- 21  GLUCOSE 116* 87 93  BUN 29* 29* 28*  CREATININE 1.54* 1.60* 1.66*  CALCIUM 8.0* -- 7.8*  MG -- -- --  PHOS -- -- --   Liver Function Tests:  Lab 01/22/12 1335  AST 50*  ALT 24  ALKPHOS 65  BILITOT 0.1*  PROT 7.9  ALBUMIN 1.6*   No results found for this basename: LIPASE:5,AMYLASE:5 in the last 168 hours No results found for this basename: AMMONIA:5 in the last 168 hours CBC:  Lab 01/23/12 0420 01/22/12 1406 01/22/12 1335  WBC 3.4* -- 2.9*  NEUTROABS -- -- 1.5*  HGB 9.1* 9.2* 9.4*  HCT 27.0* 27.0* 27.6*  MCV 87.4 -- 87.6  PLT 120* -- 125*   Cardiac Enzymes:  Lab 01/22/12 1335  CKTOTAL --  CKMB --  CKMBINDEX --  TROPONINI <0.30   BNP (last 3 results) No results found for this basename: PROBNP:3 in the  last 8760 hours CBG:  Lab 01/23/12 0551 01/22/12 1428  GLUCAP 104* 84    No results found for this or any previous visit (from the past 240 hour(s)).   Studies: Dg Chest 2 View  01/22/2012  *RADIOLOGY REPORT*  Clinical Data: Altered mental status, right leg weakness, history HIV  CHEST - 2 VIEW  Comparison: None  Findings: Borderline enlargement of cardiac silhouette. Mediastinal contours normal. Peribronchial thickening with suspected mild perihilar infiltrates. No segmental consolidation or pneumothorax. Tiny bibasilar effusions. Bones unremarkable.  IMPRESSION: Bronchitic changes with suspected mild bilateral perihilar infiltrates.   Original  Report Authenticated By: Ulyses Southward, M.D.    Ct Head Wo Contrast  01/22/2012  *RADIOLOGY REPORT*  Clinical Data: Confusion.  Altered mental status. Symptoms began Sunday.  HIV positive.  Right leg weakness.  CT HEAD WITHOUT CONTRAST  Technique:  Contiguous axial images were obtained from the base of the skull through the vertex without contrast.  Comparison: None.  Findings: There is no evidence for acute infarction, intracranial hemorrhage,   hydrocephalus, or extra-axial fluid. Premature atrophy affects the cerebral hemispheres and cerebellar white matter.  Hypodensities are noted in the bilateral thalami, left greater than right basal ganglia, and periventricular white matter. Cryptococcal infection of the brain can be associated with a somewhat similar pattern, although multiple lacunar infarcts are certainly a more common occurrence in non-HIV patients.  There is a rectangular like area of decreased attenuation in the mid pons of uncertain significance.  Osmotic demyelination syndrome could explain this finding in the appropriate clinical setting versus an inflammatory, infectious, or ischemic etiology.    If there are no contraindications, MRI brain without and with contrast could be helpful in further evaluation.  Calvarium intact.  Clear sinuses and mastoids.  Dense lenticular opacities without visible thickening of the retina/choroid.  IMPRESSION: Bilateral thalamic, basal ganglia, and periventricular hypodensities in this HIV positive patient; opportunistic infection not excluded.  Focal area of decreased attenuation in the mid pons, also of uncertain significance.  See discussion above.  Premature cerebral and cerebellar atrophy.   Original Report Authenticated By: Davonna Belling, M.D.     Scheduled Meds:   . amLODipine  5 mg Oral Daily  . efavirenz-emtricitabine-tenofovir  1 tablet Oral QHS  . levofloxacin (LEVAQUIN) IV  750 mg Intravenous Q24H  . potassium chloride  40 mEq Oral Once  . sodium  chloride  3 mL Intravenous Q12H  . sulfamethoxazole-trimethoprim  340 mg Intravenous Q8H   Continuous Infusions:   . sodium chloride 75 mL/hr at 01/22/12 2256    Principal Problem:  *PNA (pneumonia) Active Problems:  HIV (human immunodeficiency virus infection)  Brain lesion  Acute renal failure  Anemia  Altered mental status    Time spent: 40 minutes    [facility]  [facility]  If 8PM-8AM, please contact night-coverage at [email or web address], password [facility] 01/23/2012, 8:57 AM  LOS: 1 day

## 2012-01-23 NOTE — Progress Notes (Signed)
Pt bp elevated. Pt asymptomatic. Np on call made aware. No new orders received. Will cont to monitor pt.

## 2012-01-23 NOTE — Consult Note (Signed)
[facility]    Date of Admission:  01/22/2012  Date of Consult:  01/23/2012  Reason for Consult: HIV/AIDS concern for CNS infection, PCP pneumonia Referring Physician: Dr. Ardyth Harps   HPI: Jay Flores is an 51 y.o. 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 Ninetta Lights 2004. The patient presented to [facility] after a two-week history of cough dyspnea on exertion and worsening weakness in his right lower extremity with foot drop and difficulty walking. He also had apparently become abruptly confused.  He was seen in emergent department at [facility] where CT scan of the brain WITHOUT contrast was performed and showed:  IMPRESSION:  Bilateral thalamic, basal ganglia, and periventricular  hypodensities i Focal area of decreased attenuation in the mid pons,    He also had CXR which showed  Bronchitic changes with suspected mild bilateral perihilar  infiltrates.   He was started on IV trimethoprim sulfamethoxazole as well as levofloxacin and to treat possible PCP pneumonia and/or any acquired pneumonia/bronchitis. He was transferred to [facility] for further workup and MRI which is pending. Other data we have so far shows a serum cryptococcal antigen which is negative.  Meds the patient has been out of care for nearly a decade. His CD4 dear was 120 previously his CD4 count when last checked at count was 200. I have little doubt that he has a very very low CD4 count this point in time. This up as an door for a myriad of infections or malignancies and I'm quite concerned about CNS pathology here. I spent greater than 60 minutes with the patient including greater than 50% of time in face to face counsel of the patient and in coordination of their care.    Past Medical History  Diagnosis Date  . HIV (human immunodeficiency virus infection)   . Altered mental status 01/20/2012  . Acute renal  failure     Past Surgical History  Procedure Date  . No past surgeries   ergies:   No Known Allergies   Medications: I have reviewed patients current medications as documented in Epic Anti-infectives     Start     Dose/Rate Route Frequency Ordered Stop   01/22/12 2300  sulfamethoxazole-trimethoprim (BACTRIM) 340 mg in dextrose 5 % 500 mL IVPB       340 mg 347.5 mL/hr over 90 Minutes Intravenous 3 times per day 01/22/12 2201     01/22/12 2300   levofloxacin (LEVAQUIN) IVPB 750 mg        750 mg 100 mL/hr over 90 Minutes Intravenous Every 24 hours 01/22/12 2201     01/22/12 2200   efavirenz-emtricitabine-tenofovir (ATRIPLA) 600-200-300 MG per tablet 1 tablet  Status:  Discontinued        1 tablet Oral Daily at bedtime 01/22/12 2118 01/23/12 1320          Social History:  reports that he has been smoking Cigarettes.  He has a 34 pack-year smoking history. He has never used smokeless tobacco. He reports that he uses illicit drugs (Cocaine). He reports that he does not drink alcohol.  History reviewed. No pertinent family history.  As in HPI and primary teams notes otherwise 12 point review of systems is negative  Blood pressure 167/104, pulse 91, temperature 97.7 F (36.5 C), temperature source Oral, resp. rate 18, height 5\' 11"  (1.803 m), weight 124 lb 4.8 oz (56.382 kg),  SpO2 95.00%. General: Alert and awake, oriented x3, not in any acute distress. HEENT: anicteric sclera, pupils reactive to light and accommodation, EOMI, oropharynx some coating on the tongue CVS regular rate, normal r,  no murmur rubs or gallops Chest: Few crackles at the bases but otherwise clear to auscultation bilaterally, Abdomen: soft nontender, nondistended, normal bowel sounds, Extremities: no  clubbing or edema noted bilaterally Skin: no rashes Neuro: Patient has 4 or 5 strength on the right hip flexion as well as flexion extension around the knee. Dorsiflexion is also diminished at 3-4/5 flexion 4-5  in the right strength on the left side is 5 out of 5 throughout the left lower tremor the. Upper x-ray strength is symmetrical. Cranial nerves II through XII are grossly intact and cerebellar function is intact.   Results for orders placed during the hospital encounter of 01/22/12 (from the past 48 hour(s))  PROTIME-INR     Status: Normal   Collection Time   01/22/12  1:35 PM      Component Value Range Comment   Prothrombin Time 12.8  11.6 - 15.2 seconds    INR 0.97  0.00 - 1.49   APTT     Status: Abnormal   Collection Time   01/22/12  1:35 PM      Component Value Range Comment   aPTT 43 (*) 24 - 37 seconds   CBC     Status: Abnormal   Collection Time   01/22/12  1:35 PM      Component Value Range Comment   WBC 2.9 (*) 4.0 - 10.5 K/uL    RBC 3.15 (*) 4.22 - 5.81 MIL/uL    Hemoglobin 9.4 (*) 13.0 - 17.0 g/dL    HCT 84.6 (*) 96.2 - 52.0 %    MCV 87.6  78.0 - 100.0 fL    MCH 29.8  26.0 - 34.0 pg    MCHC 34.1  30.0 - 36.0 g/dL    RDW 95.2  84.1 - 32.4 %    Platelets 125 (*) 150 - 400 K/uL   DIFFERENTIAL     Status: Abnormal   Collection Time   01/22/12  1:35 PM      Component Value Range Comment   Neutrophils Relative 52  43 - 77 %    Neutro Abs 1.5 (*) 1.7 - 7.7 K/uL    Lymphocytes Relative 37  12 - 46 %    Lymphs Abs 1.1  0.7 - 4.0 K/uL    Monocytes Relative 11  3 - 12 %    Monocytes Absolute 0.3  0.1 - 1.0 K/uL    Eosinophils Relative 0  0 - 5 %    Eosinophils Absolute 0.0  0.0 - 0.7 K/uL    Basophils Relative 0  0 - 1 %    Basophils Absolute 0.0  0.0 - 0.1 K/uL   COMPREHENSIVE METABOLIC PANEL     Status: Abnormal   Collection Time   01/22/12  1:35 PM      Component Value Range Comment   Sodium 136  135 - 145 mEq/L    Potassium 3.6  3.5 - 5.1 mEq/L    Chloride 110  96 - 112 mEq/L    CO2 21  19 - 32 mEq/L    Glucose, Bld 93  70 - 99 mg/dL    BUN 28 (*) 6 - 23 mg/dL    Creatinine, Ser 4.01 (*) 0.50 - 1.35 mg/dL    Calcium 7.8 (*) 8.4 - 10.5  mg/dL    Total Protein  7.9  6.0 - 8.3 g/dL    Albumin 1.6 (*) 3.5 - 5.2 g/dL    AST 50 (*) 0 - 37 U/L    ALT 24  0 - 53 U/L    Alkaline Phosphatase 65  39 - 117 U/L    Total Bilirubin 0.1 (*) 0.3 - 1.2 mg/dL    GFR calc non Af Amer 47 (*) >90 mL/min    GFR calc Af Amer 54 (*) >90 mL/min   TROPONIN I     Status: Normal   Collection Time   01/22/12  1:35 PM      Component Value Range Comment   Troponin I <0.30  <0.30 ng/mL   POCT I-STAT, CHEM 8     Status: Abnormal   Collection Time   01/22/12  2:06 PM      Component Value Range Comment   Sodium 142  135 - 145 mEq/L    Potassium 3.6  3.5 - 5.1 mEq/L    Chloride 113 (*) 96 - 112 mEq/L    BUN 29 (*) 6 - 23 mg/dL    Creatinine, Ser 7.84 (*) 0.50 - 1.35 mg/dL    Glucose, Bld 87  70 - 99 mg/dL    Calcium, Ion 6.96  2.95 - 1.23 mmol/L    TCO2 19  0 - 100 mmol/L    Hemoglobin 9.2 (*) 13.0 - 17.0 g/dL    HCT 28.4 (*) 13.2 - 52.0 %   GLUCOSE, CAPILLARY     Status: Normal   Collection Time   01/22/12  2:28 PM      Component Value Range Comment   Glucose-Capillary 84  70 - 99 mg/dL   URINE RAPID DRUG SCREEN (HOSP PERFORMED)     Status: Normal   Collection Time   01/22/12  2:35 PM      Component Value Range Comment   Opiates NONE DETECTED  NONE DETECTED    Cocaine NONE DETECTED  NONE DETECTED    Benzodiazepines NONE DETECTED  NONE DETECTED    Amphetamines NONE DETECTED  NONE DETECTED    Tetrahydrocannabinol NONE DETECTED  NONE DETECTED    Barbiturates NONE DETECTED  NONE DETECTED   VITAMIN B12     Status: Normal   Collection Time   01/22/12  9:50 PM      Component Value Range Comment   Vitamin B-12 522  211 - 911 pg/mL   FOLATE     Status: Normal   Collection Time   01/22/12  9:50 PM      Component Value Range Comment   Folate >20.0     IRON AND TIBC     Status: Abnormal   Collection Time   01/22/12  9:50 PM      Component Value Range Comment   Iron 67  42 - 135 ug/dL    TIBC 440 (*) 102 - 725 ug/dL    Saturation Ratios 36  20 - 55 %    UIBC  119 (*) 125 - 400 ug/dL   FERRITIN     Status: Normal   Collection Time   01/22/12  9:50 PM      Component Value Range Comment   Ferritin 209  22 - 322 ng/mL   RETICULOCYTES     Status: Abnormal   Collection Time   01/22/12  9:50 PM      Component Value Range Comment   Retic Ct Pct 1.4  0.4 -  3.1 %    RBC. 3.10 (*) 4.22 - 5.81 MIL/uL    Retic Count, Manual 43.4  19.0 - 186.0 K/uL   LACTIC ACID, PLASMA     Status: Normal   Collection Time   01/22/12  9:51 PM      Component Value Range Comment   Lactic Acid, Venous 1.3  0.5 - 2.2 mmol/L   BASIC METABOLIC PANEL     Status: Abnormal   Collection Time   01/23/12  4:20 AM      Component Value Range Comment   Sodium 136  135 - 145 mEq/L    Potassium 3.3 (*) 3.5 - 5.1 mEq/L    Chloride 106  96 - 112 mEq/L    CO2 18 (*) 19 - 32 mEq/L    Glucose, Bld 116 (*) 70 - 99 mg/dL    BUN 29 (*) 6 - 23 mg/dL    Creatinine, Ser 4.09 (*) 0.50 - 1.35 mg/dL    Calcium 8.0 (*) 8.4 - 10.5 mg/dL    GFR calc non Af Amer 51 (*) >90 mL/min    GFR calc Af Amer 59 (*) >90 mL/min   CBC     Status: Abnormal   Collection Time   01/23/12  4:20 AM      Component Value Range Comment   WBC 3.4 (*) 4.0 - 10.5 K/uL    RBC 3.09 (*) 4.22 - 5.81 MIL/uL    Hemoglobin 9.1 (*) 13.0 - 17.0 g/dL    HCT 81.1 (*) 91.4 - 52.0 %    MCV 87.4  78.0 - 100.0 fL    MCH 29.4  26.0 - 34.0 pg    MCHC 33.7  30.0 - 36.0 g/dL    RDW 78.2  95.6 - 21.3 %    Platelets 120 (*) 150 - 400 K/uL   GLUCOSE, CAPILLARY     Status: Abnormal   Collection Time   01/23/12  5:51 AM      Component Value Range Comment   Glucose-Capillary 104 (*) 70 - 99 mg/dL   URINALYSIS, ROUTINE W REFLEX MICROSCOPIC     Status: Abnormal   Collection Time   01/23/12  6:02 AM      Component Value Range Comment   Color, Urine YELLOW  YELLOW    APPearance CLOUDY (*) CLEAR    Specific Gravity, Urine 1.015  1.005 - 1.030    pH 6.0  5.0 - 8.0    Glucose, UA NEGATIVE  NEGATIVE mg/dL    Hgb urine dipstick LARGE  (*) NEGATIVE    Bilirubin Urine NEGATIVE  NEGATIVE    Ketones, ur NEGATIVE  NEGATIVE mg/dL    Protein, ur >086 (*) NEGATIVE mg/dL    Urobilinogen, UA 1.0  0.0 - 1.0 mg/dL    Nitrite NEGATIVE  NEGATIVE    Leukocytes, UA TRACE (*) NEGATIVE   URINE MICROSCOPIC-ADD ON     Status: Abnormal   Collection Time   01/23/12  6:02 AM      Component Value Range Comment   Squamous Epithelial / LPF RARE  RARE    WBC, UA 11-20  <3 WBC/hpf    RBC / HPF 3-6  <3 RBC/hpf    Bacteria, UA MANY (*) RARE    Casts GRANULAR CAST (*) NEGATIVE HYALINE CASTS  CRYPTOCOCCAL ANTIGEN     Status: Normal   Collection Time   01/23/12  9:01 AM      Component Value Range Comment   Crypto Ag NEGATIVE  NEGATIVE    Cryptococcal Ag Titer NOT INDICATED  NOT INDICATED    No results found for this basename: sdes, specrequest, cult, reptstatus   Dg Chest 2 View  01/22/2012  *RADIOLOGY REPORT*  Clinical Data: Altered mental status, right leg weakness, history HIV  CHEST - 2 VIEW  Comparison: None  Findings: Borderline enlargement of cardiac silhouette. Mediastinal contours normal. Peribronchial thickening with suspected mild perihilar infiltrates. No segmental consolidation or pneumothorax. Tiny bibasilar effusions. Bones unremarkable.  IMPRESSION: Bronchitic changes with suspected mild bilateral perihilar infiltrates.   Original Report Authenticated By: Ulyses Southward, M.D.    Ct Head Wo Contrast  01/22/2012  *RADIOLOGY REPORT*  Clinical Data: Confusion.  Altered mental status. Symptoms began Sunday.  HIV positive.  Right leg weakness.  CT HEAD WITHOUT CONTRAST  Technique:  Contiguous axial images were obtained from the base of the skull through the vertex without contrast.  Comparison: None.  Findings: There is no evidence for acute infarction, intracranial hemorrhage,   hydrocephalus, or extra-axial fluid. Premature atrophy affects the cerebral hemispheres and cerebellar white matter.  Hypodensities are noted in the bilateral  thalami, left greater than right basal ganglia, and periventricular white matter. Cryptococcal infection of the brain can be associated with a somewhat similar pattern, although multiple lacunar infarcts are certainly a more common occurrence in non-HIV patients.  There is a rectangular like area of decreased attenuation in the mid pons of uncertain significance.  Osmotic demyelination syndrome could explain this finding in the appropriate clinical setting versus an inflammatory, infectious, or ischemic etiology.    If there are no contraindications, MRI brain without and with contrast could be helpful in further evaluation.  Calvarium intact.  Clear sinuses and mastoids.  Dense lenticular opacities without visible thickening of the retina/choroid.  IMPRESSION: Bilateral thalamic, basal ganglia, and periventricular hypodensities in this HIV positive patient; opportunistic infection not excluded.  Focal area of decreased attenuation in the mid pons, also of uncertain significance.  See discussion above.  Premature cerebral and cerebellar atrophy.   Original Report Authenticated By: Davonna Belling, M.D.      No results found for this or any previous visit (from the past 720 hour(s)).   Impression/Recommendation  79 old man with HIV and AIDS who is been out of care for nearly a decade now with new neurological findings with right leg and foot weakness new findings on noncontrasted CT of the brain and also with pulmonary complaints of dyspnea and cough currently being treated for PCP pneumonia/require pneumonia.  #1 brain lesions: This patient is certainly at risk for multiple opportunistic infections including cryptococcal meningitis though his coccal antigen is negative in serum. He is certain he also at risk for toxoplasmosis, CMV infection, p.m. L. due to JC virus as well as primary CNS lymphoma. Finally syphilis could also be the differential here.  --I. agree with an MRI of the brain with contrast as  this could be very informative --Patient will also ultimately need a lumbar puncture with the following to be sent:  --CSF cell count and differential  --CSF protein and glucose  --CSF stat cryptococcal antigen  --CSF for toxoplasma PCR --CSF for CMV by PCR --CSF for EBV virus by PCR --CSF for JC virus by PCR --CSF VDRL --CSF HSV and VZV PCR  #2 ? PCP vs CAP: --check LDH in serum and if pt makes sputum sputum for PCP DFA --agree with continue TMP/SMX and fluoroquinlone for now   #3 HIV/AIDS: --HIV genotype and viral load  CD4 count --EC Atripla as the patient was not taking this and it has a low barrier to resistance and would be undesirable antiviral in this particular patient --Also need to hold off on initiating antiretroviral therapy doing no if we are dealing with an opportunistic infection which might preclude immediate antiretroviral therapy. --Make sure he had [place] otherwise would need emergency ADAP   #4 OI: --pt will need weekly azithromycin for M avium prophylaxis as well  Thank you so much for this interesting consult  [facility] [phone] (pager) [phone] (office) 01/23/2012, 1:55 PM  Paulette Blanch Dam 01/23/2012, 1:55 PM

## 2012-01-23 NOTE — Progress Notes (Signed)
Utilization review completed.  P.J. Ardie Mclennan,RN,BSN Case Manager [phone]  

## 2012-01-23 NOTE — Progress Notes (Signed)
Patient seen and examined. Agree with note by Toya Smothers, NP. Patient was admitted with some mild confusion. Does have a h/o HIV. CT showed some hypodense lesions that may represent opportunistic infection (MRI pending to further characterize). CXR with possible PNA. Agree with levaquin and septra (?PCP) for now. Await results of MRI, CD4 count and viral load. Check PCP smear. ID to see in consultation. ARF improving with IVF. Has been started on norvasc for elevated BPs. Will continue to follow.  Peggye Pitt, MD [facility] Pager: [phone]

## 2012-01-23 NOTE — Progress Notes (Signed)
Pt woke up and is refusing  Antibiotics, vitals to be taken and refusing to wear his tele box. Pt was explained the importance of these interventions. Np on call made aware. Will cont to monitor pt.

## 2012-01-23 NOTE — Evaluation (Signed)
Physical Therapy Evaluation Patient Details Name: Jay Flores MRN: [id number] DOB: 1960/08/15 Today's Date: 01/23/2012 Time: 4098-1191 PT Time Calculation (min): 34 min  PT Assessment / Plan / Recommendation Clinical Impression  pt admitted with progressive weakness.  Determined to have PNA and MR showing wome supiciou density changes suggestive of infarct vs infection.  Pt can benefit from PT to maximize I.    PT Assessment  Patient needs continued PT services    Follow Up Recommendations  CIR    Does the patient have the potential to tolerate intense rehabilitation      Barriers to Discharge        Equipment Recommendations  Other (comment) (TBD post acute)    Recommendations for Other Services Rehab consult   Frequency Min 3X/week    Precautions / Restrictions     Pertinent Vitals/Pain       Mobility  Bed Mobility Bed Mobility: Rolling Right;Right Sidelying to Sit;Sitting - Scoot to [place] of Bed;Sit to Supine Rolling Right: 5: Supervision Right Sidelying to Sit: 5: Supervision;HOB flat Sitting - Scoot to Edge of Bed: 5: Supervision Sit to Supine: 5: Supervision;HOB flat Details for Bed Mobility Assistance: struggle to get legs onto the floor while coordinating trunK Transfers Transfers: Sit to Stand;Stand to Sit Sit to Stand: 3: Mod assist;With upper extremity assist;From bed;From toilet Stand to Sit: 4: Min assist;With upper extremity assist;To bed Details for Transfer Assistance: vc's for safe hand placement; truncal support and lifting assist to come to full stand Ambulation/Gait Ambulation/Gait Assistance: 4: Min assist;3: Mod assist Ambulation Distance (Feet): 25 Feet Assistive device: Rolling walker Ambulation/Gait Assistance Details: HP gait on R with shuffle, significantly decr heel/toe patten.  tendency toward retropulsion. Gait Pattern: Step-to pattern;Step-through pattern;Decreased step length - right;Decreased stance time - right;Decreased  stride length;Shuffle;Narrow base of support;Trunk flexed Gait velocity: slow, deliberate cadence Wheelchair Mobility Wheelchair Mobility: No    Shoulder Instructions     Exercises     PT Diagnosis: Difficulty walking;Generalized weakness;Acute pain (hemiparesis)  PT Problem List: Decreased strength;Decreased activity tolerance;Decreased balance;Decreased mobility;Decreased coordination;Decreased knowledge of use of DME;Impaired sensation PT Treatment Interventions: DME instruction;Gait training;Stair training;Functional mobility training;Therapeutic activities;Balance training;Neuromuscular re-education;Patient/family education   PT Goals Acute Rehab PT Goals PT Goal Formulation: With patient Time For Goal Achievement: 01/23/12 Potential to Achieve Goals: Good Pt will go Supine/Side to Sit: Independently PT Goal: Supine/Side to Sit - Progress: Goal set today Pt will go Sit to Stand: with supervision PT Goal: Sit to Stand - Progress: Goal set today Pt will Transfer Bed to Chair/Chair to Bed: with supervision PT Transfer Goal: Bed to Chair/Chair to Bed - Progress: Goal set today Pt will Ambulate: 51 - 150 feet;with supervision;with least restrictive assistive device PT Goal: Ambulate - Progress: Goal set today  Visit Information  Last PT Received On: 01/23/12 Assistance Needed: +1    Subjective Data  Subjective: I wasn't too bad more than 2 weeks ago. Patient Stated Goal: back home with sisters help as needed   Prior Functioning  Home Living Lives With: Other (Comment) (sister who works, another sis next door can assist) Available Help at Discharge: Family Type of Home: House Home Access: Stairs to enter Secretary/administrator of Steps: several Entrance Stairs-Rails: Left;Right Home Layout: Two level Alternate Level Stairs-Number of Steps: flight Alternate Level Stairs-Rails: Left;Right Bathroom Shower/Tub: Engineer, manufacturing systems: Standard Home Adaptive  Equipment: Paediatric nurse with back;Straight cane Prior Function Level of Independence: Independent with assistive device(s) Able to Take Stairs?: Yes Driving:  No Communication Communication: No difficulties    Cognition  Arousal/Alertness: Awake/alert Orientation Level: Appears intact for tasks assessed Behavior During Session: [facility] for tasks performed    Extremity/Trunk Assessment Right Lower Extremity Assessment RLE ROM/Strength/Tone: Deficits RLE ROM/Strength/Tone Deficits: muscles are slow to contract to command or stimulus.  grossly extension is 4-/5 and flextion is 3+5 RLE Sensation:  (generally WFL) RLE Coordination: Deficits RLE Coordination Deficits: Slow to respond--paretic in nature Left Lower Extremity Assessment LLE ROM/Strength/Tone: [facility] for tasks assessed;Deficits LLE ROM/Strength/Tone Deficits: generally weak at 4/5 LLE Sensation: Deficits LLE Sensation Deficits: light touch distally diminished LLE Coordination: Deficits LLE Coordination Deficits: slower to respond to command, but more coordinated than RLE Trunk Assessment Trunk Assessment: Other exceptions Trunk Exceptions: Pt unable to hold midline against a perturbation forw, back or l/R.  Has difficulty returning to midline without UE assist   Balance Balance Balance Assessed: Yes Static Sitting Balance Static Sitting - Balance Support: No upper extremity supported;Feet supported Static Sitting - Level of Assistance: 4: Min assist;5: Stand by assistance Static Sitting - Comment/# of Minutes: working on managing challenges to balance Dynamic Sitting Balance Dynamic Sitting - Balance Support: During functional activity;Feet supported;No upper extremity supported Dynamic Sitting - Level of Assistance: 4: Min assist Dynamic Standing Balance Dynamic Standing - Balance Support: During functional activity;Bilateral upper extremity supported Dynamic Standing - Level of Assistance: 4: Min assist  End of Session  PT - End of Session Equipment Utilized During Treatment: Gait belt Activity Tolerance: Patient tolerated treatment well;Patient limited by fatigue Patient left: in bed;with call bell/phone within reach Nurse Communication: Mobility status  GP     Josiephine Simao, [name] 01/23/2012, 6:55 PM  01/23/2012  Greenwood Bing, PT [phone] (pager)

## 2012-01-24 DIAGNOSIS — G039 Meningitis, unspecified: Secondary | ICD-10-CM

## 2012-01-24 LAB — BASIC METABOLIC PANEL
Calcium: 7.9 mg/dL — ABNORMAL LOW (ref 8.4–10.5)
GFR calc non Af Amer: 39 mL/min — ABNORMAL LOW (ref >90)
Glucose, Bld: 97 mg/dL (ref 70–99)
Sodium: 135 mEq/L (ref 135–145)

## 2012-01-24 LAB — HIV-1 RNA ULTRAQUANT REFLEX TO GENTYP+
HIV 1 RNA Quant: 47313 copies/mL — ABNORMAL HIGH (ref <20)
HIV-1 RNA Quant, Log: 4.67 {Log} — ABNORMAL HIGH (ref <1.30)

## 2012-01-24 LAB — URINE CULTURE

## 2012-01-24 LAB — CBC
MCH: 28.8 pg (ref 26.0–34.0)
Platelets: 128 10*3/uL — ABNORMAL LOW (ref 150–400)
RBC: 3.19 MIL/uL — ABNORMAL LOW (ref 4.22–5.81)

## 2012-01-24 LAB — RPR: RPR Ser Ql: NONREACTIVE

## 2012-01-24 LAB — HIV-1 RNA QUANT-NO REFLEX-BLD: HIV-1 RNA Quant, Log: 4.66 {Log} — ABNORMAL HIGH (ref <1.30)

## 2012-01-24 MED ORDER — AZITHROMYCIN 600 MG PO TABS
1200.0000 mg | ORAL_TABLET | ORAL | Status: DC
  Administered 2012-01-24: 1200 mg via ORAL
  Filled 2012-01-24: qty 2

## 2012-01-24 MED ORDER — LEVOFLOXACIN IN D5W 750 MG/150ML IV SOLN
750.0000 mg | INTRAVENOUS | Status: AC
  Administered 2012-01-25: 750 mg via INTRAVENOUS
  Filled 2012-01-24: qty 150

## 2012-01-24 MED ORDER — DEXTROSE 5 % IV SOLN
320.0000 mg | Freq: Three times a day (TID) | INTRAVENOUS | Status: DC
  Administered 2012-01-24 – 2012-01-25 (×4): 320 mg via INTRAVENOUS
  Filled 2012-01-24 (×9): qty 20

## 2012-01-24 MED ORDER — LIDOCAINE HCL (PF) 1 % IJ SOLN
30.0000 mL | Freq: Once | INTRAMUSCULAR | Status: AC
  Administered 2012-01-24: 15 mL via INTRADERMAL
  Filled 2012-01-24: qty 30

## 2012-01-24 MED ORDER — MORPHINE SULFATE 2 MG/ML IJ SOLN
2.0000 mg | Freq: Once | INTRAMUSCULAR | Status: AC
  Administered 2012-01-24: 4 mg via INTRAVENOUS
  Filled 2012-01-24: qty 2

## 2012-01-24 MED ORDER — SODIUM CHLORIDE 0.9 % IV SOLN
INTRAVENOUS | Status: DC
  Administered 2012-01-24: 12:00:00 via INTRAVENOUS

## 2012-01-24 NOTE — Progress Notes (Signed)
[facility] PROGRESS NOTE  Jay Flores BJY:[id number] DOB: [birth year] DOA: 01/22/2012 PCP: No primary provider on file.  Assessment/Plan: PNA (pneumonia) : per chest xray. Afebrile. WC 3.4. Non-toxic appearing. Blood cultures pending. Continue IV septra and levaquin day #3 per ID.  Appreciate their assistance  Active Problems:  HIV (human immunodeficiency virus infection)/AIDS : Per ID. Appreciate assistance. CD4 level 80.  hiv viral load pending. Cryptococcal antigen negative. Will add azithromycin 1200mg  weekly.   Brain lesion : MRI brain report pending.   Acute renal failure : Possibly more of chronic component related to uncontrolled HTN. Creatinine trending up this am.  Hold any nephrotoxins. Recheck in am.   HTN: poor control. Slight improvement with norvasc. Will continue to monitor.   Anemia : likely related to #2. TIBC 186, folate and ferritin WNL. No s/sx active bleeding. Will monitor   Encephalopathy: infectious vs. Inflammatory vs infarct. Improved.  MRI results pending. Continue antibiotics, cryptococcal antigen negative  Hypokalemia: mild. Will repleted and resolved this am. Will monitor  Code Status: full Family Communication:  Disposition Plan: home when ready   Consultants:  ID  Procedures:  none  Antibiotics: Septra 01/22/12>>>  levaquin 01/22/12>>> Azithromycin 01/23/12>>>>> weekly  HPI/Subjective: Sitting up in bed eating breakfast. Oriented to self/place. Somewhat sob with eating. Denies pain/discomfort  Objective: Filed Vitals:   01/23/12 2217 01/24/12 0000 01/24/12 0004 01/24/12 0400  BP: 158/96 174/113 172/114 152/89  Pulse:  95  87  Temp:  97.8 F (36.6 C)  97.5 F (36.4 C)  TempSrc:  Oral  Oral  Resp:  18  20  Height:      Weight:    56.019 kg (123 lb 8 oz)  SpO2:  100%  96%    Intake/Output Summary (Last 24 hours) at 01/24/12 0759 Last data filed at 01/24/12 0865  Gross per 24 hour  Intake 2907.5 ml  Output   1101 ml   Net 1806.5 ml   Filed Weights   01/22/12 1225 01/23/12 0400 01/24/12 0400  Weight: 66.679 kg (147 lb) 56.382 kg (124 lb 4.8 oz) 56.019 kg (123 lb 8 oz)    Exam:   General:  Awake alert NAD  Cardiovascular: RRR No MGR No LEE PPP  Respiratory: mild increased work of breathing with small activity i.e. Eating,standing. BS with rhonchi right and diminished on left. No wheeze/rale.  Abdomen: flat soft +BS non-tender to palpation  Neuro: speech clear. Cranial nerve II-XII intact. UE strength 5/5. Lower extremity strength right 4/5 left 5/5. Moderate foot drop  Data Reviewed: Basic Metabolic Panel:  Lab 01/24/12 7846 01/23/12 0420 01/22/12 1406 01/22/12 1335  NA 135 136 142 136  K 4.0 3.3* 3.6 3.6  CL 110 106 113* 110  CO2 18* 18* -- 21  GLUCOSE 97 116* 87 93  BUN 23 29* 29* 28*  CREATININE 1.92* 1.54* 1.60* 1.66*  CALCIUM 7.9* 8.0* -- 7.8*  MG -- -- -- --  PHOS -- -- -- --   Liver Function Tests:  Lab 01/22/12 1335  AST 50*  ALT 24  ALKPHOS 65  BILITOT 0.1*  PROT 7.9  ALBUMIN 1.6*   No results found for this basename: LIPASE:5,AMYLASE:5 in the last 168 hours No results found for this basename: AMMONIA:5 in the last 168 hours CBC:  Lab 01/24/12 0607 01/23/12 0420 01/22/12 1406 01/22/12 1335  WBC 4.6 3.4* -- 2.9*  NEUTROABS -- -- -- 1.5*  HGB 9.2* 9.1* 9.2* 9.4*  HCT 27.5* 27.0* 27.0* 27.6*  MCV  86.2 87.4 -- 87.6  PLT 128* 120* -- 125*   Cardiac Enzymes:  Lab 01/22/12 1335  CKTOTAL --  CKMB --  CKMBINDEX --  TROPONINI <0.30   BNP (last 3 results) No results found for this basename: PROBNP:3 in the last 8760 hours CBG:  Lab 01/23/12 0551 01/22/12 1428  GLUCAP 104* 84    No results found for this or any previous visit (from the past 240 hour(s)).   Studies: Dg Chest 2 View  01/22/2012  *RADIOLOGY REPORT*  Clinical Data: Altered mental status, right leg weakness, history HIV  CHEST - 2 VIEW  Comparison: None  Findings: Borderline enlargement of  cardiac silhouette. Mediastinal contours normal. Peribronchial thickening with suspected mild perihilar infiltrates. No segmental consolidation or pneumothorax. Tiny bibasilar effusions. Bones unremarkable.  IMPRESSION: Bronchitic changes with suspected mild bilateral perihilar infiltrates.   Original Report Authenticated By: Ulyses Southward, M.D.    Ct Head Wo Contrast  01/22/2012  *RADIOLOGY REPORT*  Clinical Data: Confusion.  Altered mental status. Symptoms began Sunday.  HIV positive.  Right leg weakness.  CT HEAD WITHOUT CONTRAST  Technique:  Contiguous axial images were obtained from the base of the skull through the vertex without contrast.  Comparison: None.  Findings: There is no evidence for acute infarction, intracranial hemorrhage,   hydrocephalus, or extra-axial fluid. Premature atrophy affects the cerebral hemispheres and cerebellar white matter.  Hypodensities are noted in the bilateral thalami, left greater than right basal ganglia, and periventricular white matter. Cryptococcal infection of the brain can be associated with a somewhat similar pattern, although multiple lacunar infarcts are certainly a more common occurrence in non-HIV patients.  There is a rectangular like area of decreased attenuation in the mid pons of uncertain significance.  Osmotic demyelination syndrome could explain this finding in the appropriate clinical setting versus an inflammatory, infectious, or ischemic etiology.    If there are no contraindications, MRI brain without and with contrast could be helpful in further evaluation.  Calvarium intact.  Clear sinuses and mastoids.  Dense lenticular opacities without visible thickening of the retina/choroid.  IMPRESSION: Bilateral thalamic, basal ganglia, and periventricular hypodensities in this HIV positive patient; opportunistic infection not excluded.  Focal area of decreased attenuation in the mid pons, also of uncertain significance.  See discussion above.  Premature  cerebral and cerebellar atrophy.   Original Report Authenticated By: Davonna Belling, M.D.     Scheduled Meds:   . amLODipine  5 mg Oral Daily  . levofloxacin (LEVAQUIN) IV  750 mg Intravenous Q24H  . nicotine  14 mg Transdermal Q24H  . sodium chloride  3 mL Intravenous Q12H  . sulfamethoxazole-trimethoprim  340 mg Intravenous Q8H   Continuous Infusions:   Principal Problem:  *PNA (pneumonia) Active Problems:  HIV (human immunodeficiency virus infection)  Brain lesion  Acute renal failure  Anemia  Altered mental status  Hypokalemia    Time spent: 30 minutes    [facility] M  [facility]  If 8PM-8AM, please contact night-coverage at [email or web address], password [facility] 01/24/2012, 7:59 AM  LOS: 2 days

## 2012-01-24 NOTE — Progress Notes (Signed)
Rehab Admissions Coordinator Note:  Patient was screened by Trish Mage for appropriateness for an Inpatient Acute Rehab Consult.  At this time, we are recommending Inpatient Rehab consult.  Trish Mage 01/24/2012, 9:01 AM  I can be reached at [phone].

## 2012-01-24 NOTE — Clinical Social Work Placement (Signed)
     Clinical Social Work Department CLINICAL SOCIAL WORK PLACEMENT NOTE 01/24/2012  Patient:  Jay Flores, Jay Flores  Account Number:  [id number] Admit date:  01/22/2012  Clinical Social Worker:  Margaree Mackintosh  Date/time:  01/24/2012 12:00 M  Clinical Social Work is seeking post-discharge placement for this patient at the following level of care:   SKILLED NURSING   (*CSW will update this form in Epic as items are completed)   01/24/2012  Patient/family provided with [facility] Department of Clinical Social Works list of facilities offering this level of care within the geographic area requested by the patient (or if unable, by the patients family).  01/24/2012  Patient/family informed of their freedom to choose among providers that offer the needed level of care, that participate in Medicare, Medicaid or managed care program needed by the patient, have an available bed and are willing to accept the patient.  01/24/2012  Patient/family informed of [facility] ownership interest in [facility], as well as of the fact that they are under no obligation to receive care at this facility.  PASARR submitted to EDS on 01/24/2012 PASARR number received from EDS on 01/24/2012  FL2 transmitted to all facilities in geographic area requested by pt/family on  01/24/2012 FL2 transmitted to all facilities within larger geographic area on   Patient informed that his/her managed care company has contracts with or will negotiate with  certain facilities, including the following:     Patient/family informed of bed offers received:   Patient chooses bed at  Physician recommends and patient chooses bed at    Patient to be transferred to  on   Patient to be transferred to facility by   The following physician request were entered in Epic:   Additional Comments:

## 2012-01-24 NOTE — Progress Notes (Addendum)
Patient seen and examined. Agree with note by Toya Smothers, NP. Patient with HIV/AIDS, CD4 count of 80. MRI Brain with multiple lesions that look suspicious for CMV or crypto (serum crypto Ag was negative). Lesions look less typical for toxo and CNS lymphoma. ID will attempt LP today to send for various serologies. Continue Bactrim/Levaquin for coverage of CAP/?PCP. Appreciate ID input. I have added 1200 mg/week of azithromycin for MAC prophylaxis.  ARF is worsened today. Will place on IVF. Consider further workup if no improvement with hydration.  PT has recommended CIR. Will hold on CIR consult for now as patient is not medically ready for DC at this point, but will order as we get closer to medical stability.  Peggye Pitt, MD [facility] Pager: [phone]

## 2012-01-24 NOTE — Clinical Social Work Psychosocial (Signed)
     Clinical Social Work Department BRIEF PSYCHOSOCIAL ASSESSMENT 01/24/2012  Patient:  Jay Flores, Jay Flores     Account Number:  [id number]     Admit date:  01/22/2012  Clinical Social Worker:  Margaree Mackintosh  Date/Time:  01/24/2012 09:49 AM  Referred by:  CSW  Date Referred:  01/24/2012 Referred for  SNF Placement   Other Referral:   Interview type:  Family Other interview type:    PSYCHOSOCIAL DATA Living Status:  FAMILY Admitted from facility:   Level of care:   Primary support name:  Jay Flores:[id number] Primary support relationship to patient:  SIBLING Degree of support available:   Adequate.    CURRENT CONCERNS Current Concerns  Post-Acute Placement   Other Concerns:    SOCIAL WORK ASSESSMENT / PLAN Clinical Social Worker staffed case during progresion.  CSW noted pt currently not fully alert and oriented.  CIR is currently being considered for dc options.  CSW phoned pt's sister who is listed in chart.  CSW introduced self, explained role, and provided support.  CSW provided active listening.  Sister reports the plan is CIR or home as pt has another sister who is a CNA and is planning on staying wiht pt.  Sister states she will call CSW if any questions or concerns arise.  CSW to sign off at this time, please re consult if needed.   Assessment/plan status:  Information/Referral to [facility] Other assessment/ plan:   Information/referral to community resources:   SNF  CIR  HH    PATIENTS/FAMILYS RESPONSE TO PLAN OF CARE: Pt currently unable to fully participate due to medical condition.  Sister was pleasant and engaged in conversation.  Sister thanked CSW for intervention.

## 2012-01-24 NOTE — Evaluation (Signed)
Occupational Therapy Evaluation Patient Details Name: Jay Flores MRN: [id number] DOB: November 29, 1960 Today's Date: 01/24/2012 Time: 4098-1191 OT Time Calculation (min): 19 min  OT Assessment / Plan / Recommendation Clinical Impression  51 yo male HIV positive bought to [employer] ed for AMS by his sister. Pt with pneumonia. MRI performed and revealed: Multiple foci of restricted diffusion as described within the white matter regions, of the thalami, and the left paracentral pons. The differential diagnosis includes focal ischemic lesions, septic emboli, or a typical infection including cryptococcus or CMV. Pt presents as mildly confused and with strength, sensation, balance and cognitive deficits which impact pt's safety and independence with ADLs. Pt will benefit from skilled OT in the acute setting to maximize I with ADL and ADL mobility prior to d/c. Recommend CIR for d/c plan.    OT Assessment  Patient needs continued OT Services    Follow Up Recommendations  CIR    Barriers to Discharge      Equipment Recommendations   (TBD)    Recommendations for Other Services Rehab consult  Frequency  Min 2X/week    Precautions / Restrictions Precautions Precautions: Fall   Pertinent Vitals/Pain Pt denies any pain but reports SOB. Pt 02 sats remained stable (97%-100%). HR did increase from 90's to 110's with activity.    ADL  Grooming: Wash/dry face;Minimal assistance Where Assessed - Grooming: Supported standing (UE support on chair) Upper Body Bathing: Minimal assistance Where Assessed - Upper Body Bathing: Supported sitting Lower Body Bathing: Moderate assistance Where Assessed - Lower Body Bathing: Supported sit to stand;Supported standing Lower Body Dressing: Moderate assistance Where Assessed - Lower Body Dressing: Supported sit to Pharmacist, hospital: Minimal assistance Statistician Method: Sit to Barista: Bedside commode Toileting - Clothing  Manipulation and Hygiene: Maximal assistance Where Assessed - Engineer, mining and Hygiene: Standing Equipment Used: Gait belt Transfers/Ambulation Related to ADLs: Min A sit to stand from bed x2. Mod A to weight shift lt/rt as pt with strong right lateral lean. Unsafe to attempt ambulation with +1 this eval ADL Comments: Pt with strong right lean. Required Mod/Max assist to weight shift left, pt unable to maintain greater than 5sec.     OT Diagnosis: Generalized weakness;Cognitive deficits  OT Problem List: Decreased activity tolerance;Decreased strength;Impaired balance (sitting and/or standing);Decreased coordination;Decreased cognition;Decreased safety awareness;Decreased knowledge of use of DME or AE;Decreased knowledge of precautions;Impaired UE functional use;Impaired sensation OT Treatment Interventions: Self-care/ADL training;DME and/or AE instruction;Therapeutic activities;Cognitive remediation/compensation;Patient/family education;Balance training   OT Goals Acute Rehab OT Goals OT Goal Formulation: With patient Time For Goal Achievement: 02/07/12 Potential to Achieve Goals: Good ADL Goals Pt Will Perform Grooming: Standing at sink;Sitting at sink;with supervision ADL Goal: Grooming - Progress: Goal set today Pt Will Perform Upper Body Dressing: Independently;Sitting, bed;Sitting, chair ADL Goal: Upper Body Dressing - Progress: Goal set today Pt Will Perform Lower Body Dressing: with supervision;Sit to stand from chair;Sit to stand from bed ADL Goal: Lower Body Dressing - Progress: Goal set today Pt Will Transfer to Toilet: with supervision;Ambulation;with DME ADL Goal: Toilet Transfer - Progress: Goal set today Pt Will Perform Toileting - Clothing Manipulation: with supervision;Standing ADL Goal: Toileting - Clothing Manipulation - Progress: Goal set today Pt Will Perform Toileting - Hygiene: Independently;Sitting on 3-in-1 or toilet ADL Goal: Toileting - Hygiene  - Progress: Goal set today Additional ADL Goal #1: Pt will perform dynamic standing balance activities with supervision greater than in prep for ADLs. ADL Goal: Additional Goal #  1 - Progress: Goal set today  Visit Information  Last OT Received On: 01/24/12 Assistance Needed: +2    Subjective Data  Subjective: My legs are feeling pretty weak today Patient Stated Goal: Return home   Prior Functioning     Home Living Lives With: Other (Comment) (sister who works, another sis next door can assist) Available Help at Discharge: Family Type of Home: House Home Access: Stairs to enter Secretary/administrator of Steps: several Entrance Stairs-Rails: Left;Right Home Layout: Two level Alternate Level Stairs-Number of Steps: flight Alternate Level Stairs-Rails: Left;Right Bathroom Shower/Tub: Engineer, manufacturing systems: Standard Home Adaptive Equipment: Paediatric nurse with back;Straight cane Prior Function Level of Independence: Independent with assistive device(s) Able to Take Stairs?: Yes Driving: No Communication Communication: No difficulties Dominant Hand: Left         Vision/Perception     Cognition  Arousal/Alertness: Awake/alert Orientation Level: Disoriented to;Time Behavior During Session: WFL for tasks performed    Extremity/Trunk Assessment Right Upper Extremity Assessment RUE ROM/Strength/Tone: Deficits RUE ROM/Strength/Tone Deficits: grossly 4/5; slow to respond to command Left Upper Extremity Assessment LUE ROM/Strength/Tone: Deficits LUE ROM/Strength/Tone Deficits: grossly 4+/5 LUE Sensation Deficits: denies any change in sensation     Mobility Bed Mobility Bed Mobility: Supine to Sit Supine to Sit: 4: Min guard;With rails;HOB elevated Details for Bed Mobility Assistance: difficulty bringing legs to floor while coordinating trunk      Shoulder Instructions     Exercise     Balance Static Standing Balance Static Standing - Balance  Support: Bilateral upper extremity supported Static Standing - Level of Assistance: 2: Max assist;3: Mod assist Static Standing - Comment/# of Minutes: pt with strong right lateral lean. Mod/Max to bring to midline and to increase LLE weight bearing.    End of Session OT - End of Session Equipment Utilized During Treatment: Gait belt Activity Tolerance: Patient limited by fatigue Patient left: in bed;with call bell/phone within reach;with bed alarm set Nurse Communication: Mobility status  GO     Nashiya Disbrow 01/24/2012, 12:08 PM

## 2012-01-24 NOTE — Procedures (Signed)
    [facility]   Risks and benefits of the procedure were explained to the patient and he consented to a lumbar puncture.  Patient was positioned in appropriate "fetal position" L5 vertebra and L4 vertebra and L3 identified  Area was prepped and draped in usual sterile fashion. Topical Betadine was applied to clean the area  1% lidocaine was obliterated to the skin and subcutaneous and deeper tissues to achieve anesthesia  2 mg morphine was given at beginning of procedure  Approximately 8 different attempts were made to obtain spinal fluid using his anatomy as guide. None of these attempts were successful in removing spinal fluid.  Additional 2 mg of morphine was given during the procedure  Estimated low blood loss minimal  Complications none  Last interventional radiology to perform a fluoroscopically guided lumbar puncture with approximately 28-30 mL of spinal fluid obtained and sent for studies listed in my note today. Also like an opening pressure in closing pressure documented.

## 2012-01-24 NOTE — Progress Notes (Signed)
[facility]  Day # 3 antibiotics Day # 3 levaquin Day # 3 IV TMP/SMX  Subjective: No new complaints   Antibiotics:  Anti-infectives     Start     Dose/Rate Route Frequency Ordered Stop   01/24/12 1000   azithromycin (ZITHROMAX) tablet 1,200 mg        1,200 mg Oral Weekly 01/24/12 0813     01/22/12 2300  sulfamethoxazole-trimethoprim (BACTRIM) 340 mg in dextrose 5 % 500 mL IVPB       340 mg 347.5 mL/hr over 90 Minutes Intravenous 3 times per day 01/22/12 2201     01/22/12 2300   levofloxacin (LEVAQUIN) IVPB 750 mg        750 mg 100 mL/hr over 90 Minutes Intravenous Every 24 hours 01/22/12 2201     01/22/12 2200   efavirenz-emtricitabine-tenofovir (ATRIPLA) 600-200-300 MG per tablet 1 tablet  Status:  Discontinued        1 tablet Oral Daily at bedtime 01/22/12 2118 01/23/12 1320          Medications: Scheduled Meds:   . amLODipine  5 mg Oral Daily  . azithromycin  1,200 mg Oral Weekly  . levofloxacin (LEVAQUIN) IV  750 mg Intravenous Q24H  . lidocaine  30 mL Intradermal Once  .  morphine injection  2-4 mg Intravenous Once  . nicotine  14 mg Transdermal Q24H  . sodium chloride  3 mL Intravenous Q12H  . sulfamethoxazole-trimethoprim  340 mg Intravenous Q8H   Continuous Infusions:  PRN Meds:.labetalol   Objective: Weight change: -23 lb 8 oz (-10.66 kg)  Intake/Output Summary (Last 24 hours) at 01/24/12 1011 Last data filed at 01/24/12 7829  Gross per 24 hour  Intake 2547.5 ml  Output    700 ml  Net 1847.5 ml   Blood pressure 152/89, pulse 87, temperature 97.5 F (36.4 C), temperature source Oral, resp. rate 20, height 5\' 11"  (1.803 m), weight 123 lb 8 oz (56.019 kg), SpO2 97.00%. Temp:  [97.5 F (36.4 C)-98.3 F (36.8 C)] 97.5 F (36.4 C) (12/19 0400) Pulse Rate:  [87-99] 87  (12/19 0400) Resp:  [18-20] 20  (12/19 0400) BP: (110-177)/(71-114) 152/89 mmHg (12/19 0400) SpO2:  [96 %-100 %] 97 % (12/19 0800) Weight:  [123 lb 8  oz (56.019 kg)] 123 lb 8 oz (56.019 kg) (12/19 0400)  Physical Exam: General: Alert and awake, oriented x3, not in any acute distress.  HEENT: anicteric sclera, pupils reactive to light and accommodation, EOMI, oropharynx some coating on the tongue CVS regular rate, normal r, no murmur rubs or gallops  Chest: Few crackles at the bases but otherwise clear to auscultation bilaterally, Abdomen: soft nontender, nondistended, normal bowel sounds,  Extremities: no clubbing or edema noted bilaterally  Skin: no rashes  Neuro: Patient has 4 or 5 strength on the right hip flexion as well as flexion extension around the knee. Dorsiflexion is also diminished at 3-4/5 flexion 4-5 in the right strength on the left side is 5 out of 5 throughout the left lower tremor the. Upper x-ray strength is symmetrical. Cranial nerves II through XII are grossly intact and cerebellar function is intact.  Lab Results:  [facility] 01/24/12 0607 01/23/12 0420  WBC 4.6 3.4*  HGB 9.2* 9.1*  HCT 27.5* 27.0*  PLT 128* 120*    BMET  Basename 01/24/12 0607 01/23/12 0420  NA 135 136  K 4.0 3.3*  CL 110 106  CO2 18* 18*  GLUCOSE 97 116*  BUN 23 29*  CREATININE 1.92* 1.54*  CALCIUM 7.9* 8.0*    Micro Results: Recent Results (from the past 240 hour(s))  CULTURE, BLOOD (ROUTINE X 2)     Status: Normal (Preliminary result)   Collection Time   01/22/12  9:42 PM      Component Value Range Status Comment   Specimen Description BLOOD RIGHT HAND   Final    Special Requests BOTTLES DRAWN AEROBIC AND ANAEROBIC 10CC   Final    Culture  Setup Time 01/23/2012 04:17   Final    Culture     Final    Value:        BLOOD CULTURE RECEIVED NO GROWTH TO DATE CULTURE WILL BE HELD FOR 5 DAYS BEFORE ISSUING A FINAL NEGATIVE REPORT   Report Status PENDING   Incomplete   CULTURE, BLOOD (ROUTINE X 2)     Status: Normal (Preliminary result)   Collection Time   01/22/12  9:52 PM      Component Value Range Status Comment   Specimen  Description BLOOD LEFT FOREARM   Final    Special Requests BOTTLES DRAWN AEROBIC AND ANAEROBIC 10CC   Final    Culture  Setup Time 01/23/2012 04:17   Final    Culture     Final    Value:        BLOOD CULTURE RECEIVED NO GROWTH TO DATE CULTURE WILL BE HELD FOR 5 DAYS BEFORE ISSUING A FINAL NEGATIVE REPORT   Report Status PENDING   Incomplete     Studies/Results: Dg Chest 2 View  01/22/2012  *RADIOLOGY REPORT*  Clinical Data: Altered mental status, right leg weakness, history HIV  CHEST - 2 VIEW  Comparison: None  Findings: Borderline enlargement of cardiac silhouette. Mediastinal contours normal. Peribronchial thickening with suspected mild perihilar infiltrates. No segmental consolidation or pneumothorax. Tiny bibasilar effusions. Bones unremarkable.  IMPRESSION: Bronchitic changes with suspected mild bilateral perihilar infiltrates.   Original Report Authenticated By: Ulyses Southward, M.D.    Ct Head Wo Contrast  01/22/2012  *RADIOLOGY REPORT*  Clinical Data: Confusion.  Altered mental status. Symptoms began Sunday.  HIV positive.  Right leg weakness.  CT HEAD WITHOUT CONTRAST  Technique:  Contiguous axial images were obtained from the base of the skull through the vertex without contrast.  Comparison: None.  Findings: There is no evidence for acute infarction, intracranial hemorrhage,   hydrocephalus, or extra-axial fluid. Premature atrophy affects the cerebral hemispheres and cerebellar white matter.  Hypodensities are noted in the bilateral thalami, left greater than right basal ganglia, and periventricular white matter. Cryptococcal infection of the brain can be associated with a somewhat similar pattern, although multiple lacunar infarcts are certainly a more common occurrence in non-HIV patients.  There is a rectangular like area of decreased attenuation in the mid pons of uncertain significance.  Osmotic demyelination syndrome could explain this finding in the appropriate clinical setting versus an  inflammatory, infectious, or ischemic etiology.    If there are no contraindications, MRI brain without and with contrast could be helpful in further evaluation.  Calvarium intact.  Clear sinuses and mastoids.  Dense lenticular opacities without visible thickening of the retina/choroid.  IMPRESSION: Bilateral thalamic, basal ganglia, and periventricular hypodensities in this HIV positive patient; opportunistic infection not excluded.  Focal area of decreased attenuation in the mid pons, also of uncertain significance.  See discussion above.  Premature cerebral and cerebellar atrophy.   Original Report Authenticated By: Davonna Belling, M.D.    Mr Maxine Glenn  Head Wo Contrast  01/24/2012  *RADIOLOGY REPORT*  Clinical Data:  HIV.  Altered mental status.  Abnormal CT of the head.  MRI HEAD WITHOUT AND WITH CONTRAST MRA HEAD WITHOUT CONTRAST  Technique:  Multiplanar, multiecho pulse sequences of the brain and surrounding structures were obtained without and with intravenous contrast.  Angiographic images of the head were obtained using MRA technique without contrast.  Contrast: 11mL MULTIHANCE GADOBENATE DIMEGLUMINE 529 MG/ML IV SOLN  Comparison:  CT head without contrast 01/22/2012.  MRI HEAD WITHOUT AND WITH CONTRAST  Findings:  The diffusion weighted images demonstrate multiple foci of restricted diffusion within the thalamus on the right, the posterior internal capsule on the left, the left splenium of the corpus callosum and the posterior right periventricular white matter. A punctate diffusion abnormalities present within the left paracentral pons.  There is a focus of T1 shortening along the posterior sylvian fissure compatible with focal hemorrhage.  An adjacent area of more remote hemorrhage is noted as well.  The patient has extensive atrophy and white matter disease, advanced for age. Multiple T2 hyperintensities are present within the basal ganglia bilaterally, in addition to the areas of restricted diffusion.  The  postcontrast images demonstrate no pathologic enhancement.  T1 shortening is noted in the same area of the posterior right sylvian fissure. The ventricles are of normal size.  No fluid levels are evident.  Flow is present in the major intracranial arteries.  The globes and orbits are intact.  Mild mucosal thickening is present in the anterior ethmoid air cells with minimal mucosal thickening in the frontal and maxillary sinuses bilaterally.  The sphenoid sinuses are clear.  The mastoid air cells are mostly clear as well.  IMPRESSION:  1.  Multiple foci of restricted diffusion as described within the white matter regions, of the thalami, and the left paracentral pons. The differential diagnosis includes focal ischemic lesions, septic emboli, or a typical infection including cryptococcus or CMV.  Cryptococcal or CMV infection should be highly considered in this patient with depressed means system.  The lack of enhancement argues against septic emboli, fungal infection, or primary CNS lymphoma.  These are atypical for primary CNS lymphoma lesions as well.  2.  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 3.  Focal T1 shortening along the right posterior sylvian fissure compatible with focal hemorrhage.  An adjacent area of more remote hemorrhage is noted. 4.  Minimal sinus disease.  MRA HEAD  Findings: Mild irregularities noted throughout the internal carotid arteries bilaterally without a focal stenosis.  Although this may in part be exaggerated by patient motion, this suggests some degree of vasculitis.  The right A1 segment is dominant.  Small and medium vessel irregularity is noted bilaterally.  There is narrowing of the inferior M3 branches bilaterally.  The left vertebral artery is the dominant vessel.  A moderate focal stenosis is present at the dural margin of the left vertebral artery.  The left posterior cerebral artery is of fetal type. Similar medium and small vessel  irregularity is present.  IMPRESSION:  1.  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 2.  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  Calcifications are present on the CT scan.  This could be the source of embolic disease or ischemic disease. 3.  Moderate stenoses within the inferior M3 three branches bilaterally.   Original Report Authenticated By: Marin Roberts, M.D.  Mr Laqueta Jean Wo Contrast  01/24/2012  *RADIOLOGY REPORT*  Clinical Data:  HIV.  Altered mental status.  Abnormal CT of the head.  MRI HEAD WITHOUT AND WITH CONTRAST MRA HEAD WITHOUT CONTRAST  Technique:  Multiplanar, multiecho pulse sequences of the brain and surrounding structures were obtained without and with intravenous contrast.  Angiographic images of the head were obtained using MRA technique without contrast.  Contrast: 11mL MULTIHANCE GADOBENATE DIMEGLUMINE 529 MG/ML IV SOLN  Comparison:  CT head without contrast 01/22/2012.  MRI HEAD WITHOUT AND WITH CONTRAST  Findings:  The diffusion weighted images demonstrate multiple foci of restricted diffusion within the thalamus on the right, the posterior internal capsule on the left, the left splenium of the corpus callosum and the posterior right periventricular white matter. A punctate diffusion abnormalities present within the left paracentral pons.  There is a focus of T1 shortening along the posterior sylvian fissure compatible with focal hemorrhage.  An adjacent area of more remote hemorrhage is noted as well.  The patient has extensive atrophy and white matter disease, advanced for age. Multiple T2 hyperintensities are present within the basal ganglia bilaterally, in addition to the areas of restricted diffusion.  The postcontrast images demonstrate no pathologic enhancement.  T1 shortening is noted in the same area of the posterior right sylvian fissure. The ventricles are of normal size.  No fluid  levels are evident.  Flow is present in the major intracranial arteries.  The globes and orbits are intact.  Mild mucosal thickening is present in the anterior ethmoid air cells with minimal mucosal thickening in the frontal and maxillary sinuses bilaterally.  The sphenoid sinuses are clear.  The mastoid air cells are mostly clear as well.  IMPRESSION:  1.  Multiple foci of restricted diffusion as described within the white matter regions, of the thalami, and the left paracentral pons. The differential diagnosis includes focal ischemic lesions, septic emboli, or a typical infection including cryptococcus or CMV.  Cryptococcal or CMV infection should be highly considered in this patient with depressed means system.  The lack of enhancement argues against septic emboli, fungal infection, or primary CNS lymphoma.  These are atypical for primary CNS lymphoma lesions as well.  2.  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 3.  Focal T1 shortening along the right posterior sylvian fissure compatible with focal hemorrhage.  An adjacent area of more remote hemorrhage is noted. 4.  Minimal sinus disease.  MRA HEAD  Findings: Mild irregularities noted throughout the internal carotid arteries bilaterally without a focal stenosis.  Although this may in part be exaggerated by patient motion, this suggests some degree of vasculitis.  The right A1 segment is dominant.  Small and medium vessel irregularity is noted bilaterally.  There is narrowing of the inferior M3 branches bilaterally.  The left vertebral artery is the dominant vessel.  A moderate focal stenosis is present at the dural margin of the left vertebral artery.  The left posterior cerebral artery is of fetal type. Similar medium and small vessel irregularity is present.  IMPRESSION:  1.  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 2.  Moderate stenosis of the dominant left vertebral  artery at the dural margin.  Calcifications are present on the CT scan.  This could be the source of embolic disease or ischemic disease. 3.  Moderate stenoses within the inferior M3 three branches bilaterally.   Original Report Authenticated By: Marin Roberts, M.D.  Assessment/Plan: DAEKWON BESWICK is a 51 y.o. 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    #1 brain lesions: This patient is certainly at risk for multiple opportunistic infections including cryptococcal meningitis though his coccal antigen is negative in serum. He is certain he also at risk for toxoplasmosis, CMV infection, p.m. L. due to JC virus as well as primary CNS lymphoma. Finally syphilis could also be the differential here.  MRI sounds less like classic Toxo or primary CNS lymphoma and lesions could even be related to ischemia. He needs ID workup for above and will need LP with large volume LP . I will come back and attempt at approx 130 to 2pm this afternoon  With LP for:  --CSF cell count and differential  --CSF protein and glucose  --CSF stat cryptococcal antigen  --CSF for toxoplasma PCR  --CSF for CMV by PCR  --CSF for EBV virus by PCR  --CSF for JC virus by PCR  --CSF VDRL  --CSF HSV and VZV PCR   #2 ? PCP vs CAP: LDH normal. Ch --check PCP DFA  --agree with continue TMP/SMX and fluoroquinlone for now  --check legionella ag  #3 HIV/AIDS: CD4 80 --HIV genotype and viral load  --DC'd Atripla as the patient was not taking this and it has a low barrier to resistance and would be undesirable antiviral in this particular patient  --Also need to hold off on initiating antiretroviral therapy doing no if we are dealing with an opportunistic infection  which might preclude immediate antiretroviral therapy.  --Make sure he [place] otherwise would need emergency ADAP   #4 OI:  --pt will need weekly azithromycin for M avium prophylaxis as well  Thank you so much for this interesting consult     LOS: 2 days   Acey Lav 01/24/2012, 10:11 AM

## 2012-01-24 NOTE — Progress Notes (Signed)
Pharmacy Consult for septra and levofloxacin  Indication: PCP PNA with brain lesions   Assessment:  51 yo M with HIV, on D#2 bactrim and levaquin for PNA vs. PCP PNA. AFR, scr up 1.54 > 1.92, est. crcl 36 ml/min. Bactrim 5mg /kg Q8, which is appropriate for renal function. Levaquin needs to be renally adjusted. CT showed some hypodense lesions, CD4 count = 80, crypto antigen neg, viral load. PCP smear, toxo antibody are pending. added azithro for MAC prophylaxis. Plan for LP today.   Goal of Therapy: Appropriate dosing.   Plan:  - Continue Bactrim ~5 mg/kg IV q8h --> 340 mg IV q8h  - Change Levofloxacin to 750 mg IV q48h  - Follow up SCr, UOP, cultures, clinical course and adjust as clinically indicated

## 2012-01-25 ENCOUNTER — Inpatient Hospital Stay (HOSPITAL_COMMUNITY): Payer: Medicaid Other

## 2012-01-25 LAB — BASIC METABOLIC PANEL
BUN: 17 mg/dL (ref 6–23)
CO2: 17 mEq/L — ABNORMAL LOW (ref 19–32)
Calcium: 7.8 mg/dL — ABNORMAL LOW (ref 8.4–10.5)
Creatinine, Ser: 1.84 mg/dL — ABNORMAL HIGH (ref 0.50–1.35)
GFR calc non Af Amer: 41 mL/min — ABNORMAL LOW (ref >90)
Glucose, Bld: 98 mg/dL (ref 70–99)
Sodium: 134 mEq/L — ABNORMAL LOW (ref 135–145)

## 2012-01-25 LAB — CBC
HCT: 26.7 % — ABNORMAL LOW (ref 39.0–52.0)
Hemoglobin: 9.3 g/dL — ABNORMAL LOW (ref 13.0–17.0)
MCH: 29.9 pg (ref 26.0–34.0)
MCHC: 34.8 g/dL (ref 30.0–36.0)
MCV: 85.9 fL (ref 78.0–100.0)
RBC: 3.11 MIL/uL — ABNORMAL LOW (ref 4.22–5.81)

## 2012-01-25 LAB — CSF CELL COUNT WITH DIFFERENTIAL
Tube #: 3
WBC, CSF: 2 /mm3 (ref 0–5)

## 2012-01-25 LAB — PROTEIN AND GLUCOSE, CSF
Glucose, CSF: 47 mg/dL (ref 43–76)
Total  Protein, CSF: 38 mg/dL (ref 15–45)

## 2012-01-25 LAB — GRAM STAIN

## 2012-01-25 LAB — LEGIONELLA ANTIGEN, URINE: Legionella Antigen, Urine: NEGATIVE

## 2012-01-25 MED ORDER — SULFAMETHOXAZOLE-TMP DS 800-160 MG PO TABS
2.0000 | ORAL_TABLET | Freq: Two times a day (BID) | ORAL | Status: DC
  Administered 2012-01-25 – 2012-01-29 (×8): 2 via ORAL
  Filled 2012-01-25 (×9): qty 2

## 2012-01-25 NOTE — Progress Notes (Addendum)
Patient seen and examined. Agree with note by Toya Smothers, NP. Await LP results. Appreciate ID assistance. Await labs today to follow renal function.  Peggye Pitt, MD [facility] Pager: [phone]

## 2012-01-25 NOTE — Progress Notes (Signed)
OT Cancellation Note  Patient Details Name: Jay Flores MRN: [id number] DOB: [birth year]   Cancelled Treatment:   Attempted to see patient for OT this PM.  Patient with bed rest order therefore, will attempt to see again early next week.  Veda Arrellano 01/25/2012, 1:58 PM

## 2012-01-25 NOTE — Progress Notes (Signed)
[facility]  Day # 4 antibiotics Day # 4 levaquin Day # 4 IV TMP/SMX  Subjective: No new complaints   Antibiotics:  Anti-infectives     Start     Dose/Rate Route Frequency Ordered Stop   01/25/12 2200   levofloxacin (LEVAQUIN) IVPB 750 mg        750 mg 100 mL/hr over 90 Minutes Intravenous Every 48 hours 01/24/12 1256 01/27/12 2159   01/25/12 2200  sulfamethoxazole-trimethoprim (BACTRIM DS) 800-160 MG per tablet 2 tablet       2 tablet Oral Every 12 hours 01/25/12 1546     01/24/12 1415  sulfamethoxazole-trimethoprim (BACTRIM) 320 mg in dextrose 5 % 500 mL IVPB       320 mg 346.7 mL/hr over 90 Minutes Intravenous 3 times per day 01/24/12 1403     01/24/12 1000   azithromycin (ZITHROMAX) tablet 1,200 mg        1,200 mg Oral Weekly 01/24/12 0813     01/22/12 2300   sulfamethoxazole-trimethoprim (BACTRIM) 340 mg in dextrose 5 % 500 mL IVPB  Status:  Discontinued        340 mg 347.5 mL/hr over 90 Minutes Intravenous 3 times per day 01/22/12 2201 01/24/12 1403   01/22/12 2300   levofloxacin (LEVAQUIN) IVPB 750 mg  Status:  Discontinued        750 mg 100 mL/hr over 90 Minutes Intravenous Every 24 hours 01/22/12 2201 01/24/12 1256   01/22/12 2200   efavirenz-emtricitabine-tenofovir (ATRIPLA) 600-200-300 MG per tablet 1 tablet  Status:  Discontinued        1 tablet Oral Daily at bedtime 01/22/12 2118 01/23/12 1320          Medications: Scheduled Meds:    . amLODipine  5 mg Oral Daily  . azithromycin  1,200 mg Oral Weekly  . levofloxacin (LEVAQUIN) IV  750 mg Intravenous Q48H  . nicotine  14 mg Transdermal Q24H  . sodium chloride  3 mL Intravenous Q12H  . sulfamethoxazole-trimethoprim  2 tablet Oral Q12H  . sulfamethoxazole-trimethoprim  320 mg Intravenous Q8H   Continuous Infusions:    . sodium chloride 75 mL/hr at 01/24/12 1140   PRN Meds:.labetalol   Objective: Weight change: -1 lb 8 oz (-0.68 kg)  Intake/Output Summary (Last  24 hours) at 01/25/12 1546 Last data filed at 01/25/12 1300  Gross per 24 hour  Intake   3370 ml  Output   3475 ml  Net   -105 ml   Blood pressure 137/86, pulse 89, temperature 98.6 F (37 C), temperature source Oral, resp. rate 18, height 5\' 11"  (1.803 m), weight 122 lb (55.339 kg), SpO2 98.00%. Temp:  [97.3 F (36.3 C)-98.6 F (37 C)] 98.6 F (37 C) (12/20 1413) Pulse Rate:  [89-97] 89  (12/20 1413) Resp:  [18] 18  (12/20 1413) BP: (137-157)/(82-99) 137/86 mmHg (12/20 1413) SpO2:  [97 %-98 %] 98 % (12/20 1413) Weight:  [122 lb (55.339 kg)] 122 lb (55.339 kg) (12/20 0500)  Physical Exam: General: Alert and awake, oriented x3, not in any acute distress.  HEENT: anicteric sclera, pupils reactive to light and accommodation, EOMI, oropharynx some coating on the tongue CVS regular rate, normal r, no murmur rubs or gallops  Chest: Few crackles at the bases but otherwise clear to auscultation bilaterally, Abdomen: soft nontender, nondistended, normal bowel sounds,  Extremities: no clubbing or edema noted bilaterally  Skin: no rashes  Neuro: Patient has 4 or 5 strength on the  right hip flexion as well as flexion extension around the knee. Dorsiflexion is also diminished at 3-4/5 flexion 4-5 in the right strength on the left side is 5 out of 5 throughout the left lower tremor the. Upper x-ray strength is symmetrical. Cranial nerves II through XII are grossly intact and cerebellar function is intact.  Lab Results:  Basename 01/25/12 0800 01/24/12 0607  WBC 4.7 4.6  HGB 9.3* 9.2*  HCT 26.7* 27.5*  PLT 125* 128*    BMET  Basename 01/25/12 0800 01/24/12 0607  NA 134* 135  K 4.1 4.0  CL 107 110  CO2 17* 18*  GLUCOSE 98 97  BUN 17 23  CREATININE 1.84* 1.92*  CALCIUM 7.8* 7.9*    Micro Results: Recent Results (from the past 240 hour(s))  CULTURE, BLOOD (ROUTINE X 2)     Status: Normal (Preliminary result)   Collection Time   01/22/12  9:42 PM      Component Value Range Status  Comment   Specimen Description BLOOD RIGHT HAND   Final    Special Requests BOTTLES DRAWN AEROBIC AND ANAEROBIC 10CC   Final    Culture  Setup Time 01/23/2012 04:17   Final    Culture     Final    Value:        BLOOD CULTURE RECEIVED NO GROWTH TO DATE CULTURE WILL BE HELD FOR 5 DAYS BEFORE ISSUING A FINAL NEGATIVE REPORT   Report Status PENDING   Incomplete   CULTURE, BLOOD (ROUTINE X 2)     Status: Normal (Preliminary result)   Collection Time   01/22/12  9:52 PM      Component Value Range Status Comment   Specimen Description BLOOD LEFT FOREARM   Final    Special Requests BOTTLES DRAWN AEROBIC AND ANAEROBIC 10CC   Final    Culture  Setup Time 01/23/2012 04:17   Final    Culture     Final    Value:        BLOOD CULTURE RECEIVED NO GROWTH TO DATE CULTURE WILL BE HELD FOR 5 DAYS BEFORE ISSUING A FINAL NEGATIVE REPORT   Report Status PENDING   Incomplete   URINE CULTURE     Status: Normal   Collection Time   01/23/12  6:02 AM      Component Value Range Status Comment   Specimen Description URINE, RANDOM   Final    Special Requests NONE   Final    Culture  Setup Time 01/23/2012 20:19   Final    Colony Count 20,OOO COLONIES/ML   Final    Culture     Final    Value: Multiple bacterial morphotypes present, none predominant. Suggest appropriate recollection if clinically indicated.   Report Status 01/24/2012 FINAL   Final   GRAM STAIN     Status: Normal   Collection Time   01/25/12  9:30 AM      Component Value Range Status Comment   Specimen Description CSF   Final    Special Requests Immunocompromised   Final    Gram Stain     Final    Value: CYTOSPIN PREP     WBC PRESENT, PREDOMINANTLY MONONUCLEAR     NO ORGANISMS SEEN   Report Status 01/25/2012 FINAL   Final     Studies/Results: Mr Jay Flores Wo Contrast  01/24/2012  *RADIOLOGY REPORT*  Clinical Data:  HIV.  Altered mental status.  Abnormal CT of the head.  MRI HEAD WITHOUT AND WITH  CONTRAST MRA HEAD WITHOUT CONTRAST   Technique:  Multiplanar, multiecho pulse sequences of the brain and surrounding structures were obtained without and with intravenous contrast.  Angiographic images of the head were obtained using MRA technique without contrast.  Contrast: 11mL MULTIHANCE GADOBENATE DIMEGLUMINE 529 MG/ML IV SOLN  Comparison:  CT head without contrast 01/22/2012.  MRI HEAD WITHOUT AND WITH CONTRAST  Findings:  The diffusion weighted images demonstrate multiple foci of restricted diffusion within the thalamus on the right, the posterior internal capsule on the left, the left splenium of the corpus callosum and the posterior right periventricular white matter. A punctate diffusion abnormalities present within the left paracentral pons.  There is a focus of T1 shortening along the posterior sylvian fissure compatible with focal hemorrhage.  An adjacent area of more remote hemorrhage is noted as well.  The patient has extensive atrophy and white matter disease, advanced for age. Multiple T2 hyperintensities are present within the basal ganglia bilaterally, in addition to the areas of restricted diffusion.  The postcontrast images demonstrate no pathologic enhancement.  T1 shortening is noted in the same area of the posterior right sylvian fissure. The ventricles are of normal size.  No fluid levels are evident.  Flow is present in the major intracranial arteries.  The globes and orbits are intact.  Mild mucosal thickening is present in the anterior ethmoid air cells with minimal mucosal thickening in the frontal and maxillary sinuses bilaterally.  The sphenoid sinuses are clear.  The mastoid air cells are mostly clear as well.  IMPRESSION:  1.  Multiple foci of restricted diffusion as described within the white matter regions, of the thalami, and the left paracentral pons. The differential diagnosis includes focal ischemic lesions, septic emboli, or a typical infection including cryptococcus or CMV.  Cryptococcal or CMV infection should  be highly considered in this patient with depressed means system.  The lack of enhancement argues against septic emboli, fungal infection, or primary CNS lymphoma.  These are atypical for primary CNS lymphoma lesions as well.  2.  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 3.  Focal T1 shortening along the right posterior sylvian fissure compatible with focal hemorrhage.  An adjacent area of more remote hemorrhage is noted. 4.  Minimal sinus disease.  MRA HEAD  Findings: Mild irregularities noted throughout the internal carotid arteries bilaterally without a focal stenosis.  Although this may in part be exaggerated by patient motion, this suggests some degree of vasculitis.  The right A1 segment is dominant.  Small and medium vessel irregularity is noted bilaterally.  There is narrowing of the inferior M3 branches bilaterally.  The left vertebral artery is the dominant vessel.  A moderate focal stenosis is present at the dural margin of the left vertebral artery.  The left posterior cerebral artery is of fetal type. Similar medium and small vessel irregularity is present.  IMPRESSION:  1.  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 2.  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  Calcifications are present on the CT scan.  This could be the source of embolic disease or ischemic disease. 3.  Moderate stenoses within the inferior M3 three branches bilaterally.   Original Report Authenticated By: Marin Roberts, M.D.    Mr Laqueta Jean Wo Contrast  01/24/2012  *RADIOLOGY REPORT*  Clinical Data:  HIV.  Altered mental status.  Abnormal CT of the head.  MRI HEAD  WITHOUT AND WITH CONTRAST MRA HEAD WITHOUT CONTRAST  Technique:  Multiplanar, multiecho pulse sequences of the brain and surrounding structures were obtained without and with intravenous contrast.  Angiographic images of the head were  obtained using MRA technique without contrast.  Contrast: 11mL MULTIHANCE GADOBENATE DIMEGLUMINE 529 MG/ML IV SOLN  Comparison:  CT head without contrast 01/22/2012.  MRI HEAD WITHOUT AND WITH CONTRAST  Findings:  The diffusion weighted images demonstrate multiple foci of restricted diffusion within the thalamus on the right, the posterior internal capsule on the left, the left splenium of the corpus callosum and the posterior right periventricular white matter. A punctate diffusion abnormalities present within the left paracentral pons.  There is a focus of T1 shortening along the posterior sylvian fissure compatible with focal hemorrhage.  An adjacent area of more remote hemorrhage is noted as well.  The patient has extensive atrophy and white matter disease, advanced for age. Multiple T2 hyperintensities are present within the basal ganglia bilaterally, in addition to the areas of restricted diffusion.  The postcontrast images demonstrate no pathologic enhancement.  T1 shortening is noted in the same area of the posterior right sylvian fissure. The ventricles are of normal size.  No fluid levels are evident.  Flow is present in the major intracranial arteries.  The globes and orbits are intact.  Mild mucosal thickening is present in the anterior ethmoid air cells with minimal mucosal thickening in the frontal and maxillary sinuses bilaterally.  The sphenoid sinuses are clear.  The mastoid air cells are mostly clear as well.  IMPRESSION:  1.  Multiple foci of restricted diffusion as described within the white matter regions, of the thalami, and the left paracentral pons. The differential diagnosis includes focal ischemic lesions, septic emboli, or a typical infection including cryptococcus or CMV.  Cryptococcal or CMV infection should be highly considered in this patient with depressed means system.  The lack of enhancement argues against septic emboli, fungal infection, or primary CNS lymphoma.  These are  atypical for primary CNS lymphoma lesions as well.  2.  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 3.  Focal T1 shortening along the right posterior sylvian fissure compatible with focal hemorrhage.  An adjacent area of more remote hemorrhage is noted. 4.  Minimal sinus disease.  MRA HEAD  Findings: Mild irregularities noted throughout the internal carotid arteries bilaterally without a focal stenosis.  Although this may in part be exaggerated by patient motion, this suggests some degree of vasculitis.  The right A1 segment is dominant.  Small and medium vessel irregularity is noted bilaterally.  There is narrowing of the inferior M3 branches bilaterally.  The left vertebral artery is the dominant vessel.  A moderate focal stenosis is present at the dural margin of the left vertebral artery.  The left posterior cerebral artery is of fetal type. Similar medium and small vessel irregularity is present.  IMPRESSION:  1.  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 2.  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  Calcifications are present on the CT scan.  This could be the source of embolic disease or ischemic disease. 3.  Moderate stenoses within the inferior M3 three branches bilaterally.   Original Report Authenticated By: Marin Roberts, M.D.    Dg Fluoro Guide Lumbar Puncture  01/25/2012  *RADIOLOGY REPORT*  Clinical Data:  CNS infection.  DIAGNOSTIC LUMBAR PUNCTURE UNDER FLUOROSCOPIC GUIDANCE  Fluoroscopy  time:  0.42 minutes.  Technique:  Informed consent was obtained from the patient prior to the procedure, including potential complications of headache, allergy, and pain.   With the patient prone, the lower back was prepped with Betadine.  1% Lidocaine was used for local anesthesia. Lumbar puncture was performed at the L2-3 level using a 20 gauge needle with return of slightly cloudy  CSF.  21 ml of CSF were obtained for laboratory studies.  The patient tolerated the procedure well and there were no apparent complications.  IMPRESSION: Fluoroscopic guided lumbar puncture with 21 ml of slightly cloudy CSF obtained for laboratory evaluation.   Original Report Authenticated By: Rudie Meyer, M.D.       Assessment/Plan: Jay Flores is a 51 y.o. 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    #1 brain lesions: This patient is certainly at risk for multiple opportunistic infections including cryptococcal meningitis though his coccal antigen is negative in serum. He is certain he also at risk for toxoplasmosis, CMV infection, p.m. L. due to JC virus as well as primary CNS lymphoma. Finally syphilis could also be the differential here.  MRI sounds less like classic Toxo or primary CNS lymphoma and lesions could even be related to ischemia. LP with many RBC few WBC. I do not see CSF protein or glucose. His CSF crypto ag negative. CSF for  for toxoplasma PCR , CMV by PCR  EBV virus by PCR  JC virus by PCR -CSF VDRL  CSF HSV and VZV PCR ALL pending  I think he will need placement in Rehab vs SNF due to his leg weakness for PT and further care.    #2 ? PCP vs CAP: LDH normal. Ch --check PCP DFA  --agree with continue TMP/SMX BUT CHANGE TO ORAL --give last dose of levaquin tonight   #3 HIV/AIDS: CD4 80 --HIV genotype pending --I would like to make sure that he does NOT have an obvious CNS OI before starting ARVs. --I would like to place him on Tivicay and either truvada or epzicom. His renal function has been worsening since admission but is stablizing   --Make sure he had [place]icaid otherwise would need emergency ADAP   #4 OI:  contnue azithromycin weekly for prophlaxis.  Dr. Orvan Falconer available on the weekend for questions.      LOS: 3 days   Acey Lav 01/25/2012, 3:46 PM

## 2012-01-25 NOTE — Progress Notes (Signed)
Physical Therapy Treatment Patient Details Name: Jay Flores MRN: [id number] DOB: 07-Nov-1960 Today's Date: 01/25/2012 Time: 2130-8657 PT Time Calculation (min): 24 min  PT Assessment / Plan / Recommendation Comments on Treatment Session  Gait more ataxic and halted today with pt not as able to control his balance.     Follow Up Recommendations  CIR     Does the patient have the potential to tolerate intense rehabilitation     Barriers to Discharge        Equipment Recommendations       Recommendations for Other Services    Frequency Min 3X/week   Plan Discharge plan remains appropriate;Frequency remains appropriate    Precautions / Restrictions Precautions Precautions: Fall   Pertinent Vitals/Pain     Mobility  Bed Mobility Bed Mobility: Supine to Sit;Sit to Supine Rolling Right: 5: Supervision Supine to Sit: 6: Modified independent (Device/Increase time);HOB elevated Sit to Supine: 6: Modified independent (Device/Increase time) Transfers Transfers: Sit to Stand;Stand to Sit Sit to Stand: 4: Min assist Stand to Sit: 4: Min assist Details for Transfer Assistance: vc's for safe hand placement; truncal support and lifting assist Ambulation/Gait Ambulation/Gait Assistance: 3: Mod assist Ambulation Distance (Feet): 40 Feet Assistive device: 1 person hand held assist (iv pole) Ambulation/Gait Assistance Details: ataxic gait with narrowed BOS (stepping on his feet).  Not as able to do heel/toe pattern Gait Pattern: Step-to pattern;Step-through pattern;Decreased step length - right;Decreased step length - left;Decreased stride length;Ataxic Wheelchair Mobility Wheelchair Mobility: No    Exercises     PT Diagnosis:    PT Problem List:   PT Treatment Interventions:     PT Goals Acute Rehab PT Goals PT Goal Formulation: With patient Potential to Achieve Goals: Good PT Goal: Supine/Side to Sit - Progress: Progressing toward goal PT Goal: Sit to Stand - Progress:  Progressing toward goal PT Transfer Goal: Bed to Chair/Chair to Bed - Progress: Progressing toward goal PT Goal: Ambulate - Progress: Not met  Visit Information  Last PT Received On: 01/25/12 Assistance Needed: +2 (safety)    Subjective Data  Subjective: It hard today, I must be stiff   Cognition  Overall Cognitive Status: Appears within functional limits for tasks assessed/performed Arousal/Alertness: Awake/alert Behavior During Session: [facility] for tasks performed    Balance  Static Sitting Balance Static Sitting - Balance Support: No upper extremity supported;Feet supported Static Sitting - Level of Assistance: 5: Stand by assistance Static Standing Balance Static Standing - Balance Support: Right upper extremity supported;Left upper extremity supported;During functional activity Static Standing - Level of Assistance: 3: Mod assist  End of Session PT - End of Session Equipment Utilized During Treatment: Gait belt Activity Tolerance: Patient tolerated treatment well;Patient limited by fatigue Patient left: in bed;with call bell/phone within reach Nurse Communication: Mobility status   GP     [name], [name] 01/25/2012, 4:54 PM  01/25/2012  [name] Bing, PT [phone] (pager)

## 2012-01-25 NOTE — Progress Notes (Signed)
[facility] PROGRESS NOTE  Jay Flores AOZ:[id number] DOB: 1960-08-05 DOA: 01/22/2012 PCP: No primary provider on file.  Assessment/Plan: PNA (pneumonia) : per chest xray. CAP/?PCP. Pt remains afebrile. WC 4.6 Non-toxic appearing. Blood cultures no growth to date.  Continue IV septra and levaquin day #4 per ID. Appreciate their assistance  Active Problems:   HIV (human immunodeficiency virus infection)/AIDS : Per ID. Appreciate assistance. CD4 level 80. MRI brain with several lesions suspicious for CMV or crypto (serum crypto Ag negative) Planning LP per IR today. continue azithromycin 1200mg  weekly for MAC prophylaxis.    Brain lesion : see #2.   Acute renal failure : bmet pending.  Hold any nephrotoxins. If no improvement will work up further.    HTN:  Slight improvement with norvasc. SBP ranger 149-160. DBP ranger 82-102. Will continue to monitor.   Anemia : likely related to #2. TIBC 186, folate and ferritin WNL. No s/sx active bleeding. Will monitor   Encephalopathy: infectious vs. Inflammatory vs infarct. Improved. MRI with lesions in brain. See #2. Continue antibiotics, cryptococcal antigen negative . Pt cooperative, follows commands. Stable  Hypokalemia: mild. Repleted and resolved. bmet pending today.   Code Status: full Family Communication:  Disposition Plan: ? CIR when medically stable.   Consultants:  ID  Procedures:  LP 01/24/12  Antibiotics: Septra 01/22/12>>>  levaquin 01/22/12>>>  Azithromycin 01/23/12>>>>> weekly     HPI/Subjective: Lying in bed eyes closed. Easily aroused. NAD Objective: Filed Vitals:   01/24/12 1438 01/24/12 1524 01/24/12 2100 01/25/12 0500  BP: 149/97 160/102 156/97 148/82  Pulse:  107 96 97  Temp:  97.8 F (36.6 C) 97.7 F (36.5 C) 97.3 F (36.3 C)  TempSrc:  Oral Oral Oral  Resp:  18    Height:      Weight:    55.339 kg (122 lb)  SpO2:  96% 97% 98%    Intake/Output Summary (Last 24 hours) at 01/25/12  0710 Last data filed at 01/25/12 9629  Gross per 24 hour  Intake 2903.75 ml  Output   2775 ml  Net 128.75 ml   Filed Weights   01/23/12 0400 01/24/12 0400 01/25/12 0500  Weight: 56.382 kg (124 lb 4.8 oz) 56.019 kg (123 lb 8 oz) 55.339 kg (122 lb)    Exam:   General:  Easily aroused. Oriented to self and place  Cardiovascular: RRR No MGR No LEE  Respiratory: mild increased work of breathing with exertion. BS with diffuse rhonchi. No wheeze/crackles  Abdomen: soft + BS Non-tender to palpation  Data Reviewed: Basic Metabolic Panel:  Lab 01/24/12 5284 01/23/12 0420 01/22/12 1406 01/22/12 1335  NA 135 136 142 136  K 4.0 3.3* 3.6 3.6  CL 110 106 113* 110  CO2 18* 18* -- 21  GLUCOSE 97 116* 87 93  BUN 23 29* 29* 28*  CREATININE 1.92* 1.54* 1.60* 1.66*  CALCIUM 7.9* 8.0* -- 7.8*  MG -- -- -- --  PHOS -- -- -- --   Liver Function Tests:  Lab 01/22/12 1335  AST 50*  ALT 24  ALKPHOS 65  BILITOT 0.1*  PROT 7.9  ALBUMIN 1.6*   No results found for this basename: LIPASE:5,AMYLASE:5 in the last 168 hours No results found for this basename: AMMONIA:5 in the last 168 hours CBC:  Lab 01/24/12 0607 01/23/12 0420 01/22/12 1406 01/22/12 1335  WBC 4.6 3.4* -- 2.9*  NEUTROABS -- -- -- 1.5*  HGB 9.2* 9.1* 9.2* 9.4*  HCT 27.5* 27.0* 27.0* 27.6*  MCV  86.2 87.4 -- 87.6  PLT 128* 120* -- 125*   Cardiac Enzymes:  Lab 01/22/12 1335  CKTOTAL --  CKMB --  CKMBINDEX --  TROPONINI <0.30   BNP (last 3 results) No results found for this basename: PROBNP:3 in the last 8760 hours CBG:  Lab 01/23/12 0551 01/22/12 1428  GLUCAP 104* 84    Recent Results (from the past 240 hour(s))  CULTURE, BLOOD (ROUTINE X 2)     Status: Normal (Preliminary result)   Collection Time   01/22/12  9:42 PM      Component Value Range Status Comment   Specimen Description BLOOD RIGHT HAND   Final    Special Requests BOTTLES DRAWN AEROBIC AND ANAEROBIC 10CC   Final    Culture  Setup Time  01/23/2012 04:17   Final    Culture     Final    Value:        BLOOD CULTURE RECEIVED NO GROWTH TO DATE CULTURE WILL BE HELD FOR 5 DAYS BEFORE ISSUING A FINAL NEGATIVE REPORT   Report Status PENDING   Incomplete   CULTURE, BLOOD (ROUTINE X 2)     Status: Normal (Preliminary result)   Collection Time   01/22/12  9:52 PM      Component Value Range Status Comment   Specimen Description BLOOD LEFT FOREARM   Final    Special Requests BOTTLES DRAWN AEROBIC AND ANAEROBIC 10CC   Final    Culture  Setup Time 01/23/2012 04:17   Final    Culture     Final    Value:        BLOOD CULTURE RECEIVED NO GROWTH TO DATE CULTURE WILL BE HELD FOR 5 DAYS BEFORE ISSUING A FINAL NEGATIVE REPORT   Report Status PENDING   Incomplete   URINE CULTURE     Status: Normal   Collection Time   01/23/12  6:02 AM      Component Value Range Status Comment   Specimen Description URINE, RANDOM   Final    Special Requests NONE   Final    Culture  Setup Time 01/23/2012 20:19   Final    Colony Count 20,OOO COLONIES/ML   Final    Culture     Final    Value: Multiple bacterial morphotypes present, none predominant. Suggest appropriate recollection if clinically indicated.   Report Status 01/24/2012 FINAL   Final      Studies: Mr Jay Flores UJ Contrast  01/24/2012  *RADIOLOGY REPORT*  Clinical Data:  HIV.  Altered mental status.  Abnormal CT of the head.  MRI HEAD WITHOUT AND WITH CONTRAST MRA HEAD WITHOUT CONTRAST  Technique:  Multiplanar, multiecho pulse sequences of the brain and surrounding structures were obtained without and with intravenous contrast.  Angiographic images of the head were obtained using MRA technique without contrast.  Contrast: 11mL MULTIHANCE GADOBENATE DIMEGLUMINE 529 MG/ML IV SOLN  Comparison:  CT head without contrast 01/22/2012.  MRI HEAD WITHOUT AND WITH CONTRAST  Findings:  The diffusion weighted images demonstrate multiple foci of restricted diffusion within the thalamus on the right, the posterior  internal capsule on the left, the left splenium of the corpus callosum and the posterior right periventricular white matter. A punctate diffusion abnormalities present within the left paracentral pons.  There is a focus of T1 shortening along the posterior sylvian fissure compatible with focal hemorrhage.  An adjacent area of more remote hemorrhage is noted as well.  The patient has extensive atrophy and white matter  disease, advanced for age. Multiple T2 hyperintensities are present within the basal ganglia bilaterally, in addition to the areas of restricted diffusion.  The postcontrast images demonstrate no pathologic enhancement.  T1 shortening is noted in the same area of the posterior right sylvian fissure. The ventricles are of normal size.  No fluid levels are evident.  Flow is present in the major intracranial arteries.  The globes and orbits are intact.  Mild mucosal thickening is present in the anterior ethmoid air cells with minimal mucosal thickening in the frontal and maxillary sinuses bilaterally.  The sphenoid sinuses are clear.  The mastoid air cells are mostly clear as well.  IMPRESSION:  1.  Multiple foci of restricted diffusion as described within the white matter regions, of the thalami, and the left paracentral pons. The differential diagnosis includes focal ischemic lesions, septic emboli, or a typical infection including cryptococcus or CMV.  Cryptococcal or CMV infection should be highly considered in this patient with depressed means system.  The lack of enhancement argues against septic emboli, fungal infection, or primary CNS lymphoma.  These are atypical for primary CNS lymphoma lesions as well.  2.  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 3.  Focal T1 shortening along the right posterior sylvian fissure compatible with focal hemorrhage.  An adjacent area of more remote hemorrhage is noted. 4.  Minimal sinus disease.  MRA HEAD  Findings: Mild  irregularities noted throughout the internal carotid arteries bilaterally without a focal stenosis.  Although this may in part be exaggerated by patient motion, this suggests some degree of vasculitis.  The right A1 segment is dominant.  Small and medium vessel irregularity is noted bilaterally.  There is narrowing of the inferior M3 branches bilaterally.  The left vertebral artery is the dominant vessel.  A moderate focal stenosis is present at the dural margin of the left vertebral artery.  The left posterior cerebral artery is of fetal type. Similar medium and small vessel irregularity is present.  IMPRESSION:  1.  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 2.  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  Calcifications are present on the CT scan.  This could be the source of embolic disease or ischemic disease. 3.  Moderate stenoses within the inferior M3 three branches bilaterally.   Original Report Authenticated By: Marin Roberts, M.D.    Mr Laqueta Jean Wo Contrast  01/24/2012  *RADIOLOGY REPORT*  Clinical Data:  HIV.  Altered mental status.  Abnormal CT of the head.  MRI HEAD WITHOUT AND WITH CONTRAST MRA HEAD WITHOUT CONTRAST  Technique:  Multiplanar, multiecho pulse sequences of the brain and surrounding structures were obtained without and with intravenous contrast.  Angiographic images of the head were obtained using MRA technique without contrast.  Contrast: 11mL MULTIHANCE GADOBENATE DIMEGLUMINE 529 MG/ML IV SOLN  Comparison:  CT head without contrast 01/22/2012.  MRI HEAD WITHOUT AND WITH CONTRAST  Findings:  The diffusion weighted images demonstrate multiple foci of restricted diffusion within the thalamus on the right, the posterior internal capsule on the left, the left splenium of the corpus callosum and the posterior right periventricular white matter. A punctate diffusion abnormalities present within the left  paracentral pons.  There is a focus of T1 shortening along the posterior sylvian fissure compatible with focal hemorrhage.  An adjacent area of more remote hemorrhage is noted as well.  The patient has extensive atrophy  and white matter disease, advanced for age. Multiple T2 hyperintensities are present within the basal ganglia bilaterally, in addition to the areas of restricted diffusion.  The postcontrast images demonstrate no pathologic enhancement.  T1 shortening is noted in the same area of the posterior right sylvian fissure. The ventricles are of normal size.  No fluid levels are evident.  Flow is present in the major intracranial arteries.  The globes and orbits are intact.  Mild mucosal thickening is present in the anterior ethmoid air cells with minimal mucosal thickening in the frontal and maxillary sinuses bilaterally.  The sphenoid sinuses are clear.  The mastoid air cells are mostly clear as well.  IMPRESSION:  1.  Multiple foci of restricted diffusion as described within the white matter regions, of the thalami, and the left paracentral pons. The differential diagnosis includes focal ischemic lesions, septic emboli, or a typical infection including cryptococcus or CMV.  Cryptococcal or CMV infection should be highly considered in this patient with depressed means system.  The lack of enhancement argues against septic emboli, fungal infection, or primary CNS lymphoma.  These are atypical for primary CNS lymphoma lesions as well.  2.  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 3.  Focal T1 shortening along the right posterior sylvian fissure compatible with focal hemorrhage.  An adjacent area of more remote hemorrhage is noted. 4.  Minimal sinus disease.  MRA HEAD  Findings: Mild irregularities noted throughout the internal carotid arteries bilaterally without a focal stenosis.  Although this may in part be exaggerated by patient motion, this suggests some  degree of vasculitis.  The right A1 segment is dominant.  Small and medium vessel irregularity is noted bilaterally.  There is narrowing of the inferior M3 branches bilaterally.  The left vertebral artery is the dominant vessel.  A moderate focal stenosis is present at the dural margin of the left vertebral artery.  The left posterior cerebral artery is of fetal type. Similar medium and small vessel irregularity is present.  IMPRESSION:  1.  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 2.  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  Calcifications are present on the CT scan.  This could be the source of embolic disease or ischemic disease. 3.  Moderate stenoses within the inferior M3 three branches bilaterally.   Original Report Authenticated By: Marin Roberts, M.D.     Scheduled Meds:   . amLODipine  5 mg Oral Daily  . azithromycin  1,200 mg Oral Weekly  . levofloxacin (LEVAQUIN) IV  750 mg Intravenous Q48H  . nicotine  14 mg Transdermal Q24H  . sodium chloride  3 mL Intravenous Q12H  . sulfamethoxazole-trimethoprim  320 mg Intravenous Q8H   Continuous Infusions:   . sodium chloride 75 mL/hr at 01/24/12 1140    Principal Problem:  *PNA (pneumonia) Active Problems:  HIV (human immunodeficiency virus infection)  Brain lesion  Acute renal failure  Anemia  Altered mental status  Hypokalemia    Time spent: 30 minutes    [facility] M  [facility]  If 8PM-8AM, please contact night-coverage at [email or web address], password [facility] 01/25/2012, 7:10 AM  LOS: 3 days

## 2012-01-26 LAB — CBC
MCH: 29.4 pg (ref 26.0–34.0)
MCHC: 33.8 g/dL (ref 30.0–36.0)
MCV: 86.9 fL (ref 78.0–100.0)
Platelets: 123 10*3/uL — ABNORMAL LOW (ref 150–400)
RBC: 3.13 MIL/uL — ABNORMAL LOW (ref 4.22–5.81)

## 2012-01-26 LAB — BASIC METABOLIC PANEL
CO2: 16 mEq/L — ABNORMAL LOW (ref 19–32)
Calcium: 7.9 mg/dL — ABNORMAL LOW (ref 8.4–10.5)
Chloride: 108 mEq/L (ref 96–112)
Glucose, Bld: 81 mg/dL (ref 70–99)
Potassium: 4.4 mEq/L (ref 3.5–5.1)
Sodium: 134 mEq/L — ABNORMAL LOW (ref 135–145)

## 2012-01-26 NOTE — Progress Notes (Signed)
Pt has been refusing telemetry monitoring for most of the night. When the monitor is on, pt in NSR in the 90's. MD notified that pt is refusing tele at this time.  Harless Litten, RN 01/26/12

## 2012-01-26 NOTE — Progress Notes (Signed)
[facility]             Progress Note   Subjective: No complaints.  Objective: Vital signs in last 24 hours: Temp:  [98.3 F (36.8 C)-98.6 F (37 C)] 98.3 F (36.8 C) (12/20 2100) Pulse Rate:  [89-94] 94  (12/20 2100) Resp:  [18] 18  (12/20 1413) BP: (130-149)/(80-105) 130/80 mmHg (12/21 1024) SpO2:  [98 %-100 %] 100 % (12/20 2100) Weight:  [55.112 kg (121 lb 8 oz)] 55.112 kg (121 lb 8 oz) (12/20 2100) Weight change: -0.227 kg (-8 oz) Last BM Date: 01/23/12  Intake/Output from previous day: 12/20 0701 - 12/21 0700 In: 240 [P.O.:240] Out: 700 [Urine:700] Total I/O In: 200 [P.O.:200] Out: 700 [Urine:700]   Physical Exam: General: Alert, awake, pleasant. HEENT: No bruits, no goiter. Heart: Regular rate and rhythm, without murmurs, rubs, gallops. Lungs: Clear to auscultation bilaterally. Abdomen: Soft, nontender, nondistended, positive bowel sounds. Extremities: No clubbing cyanosis or edema with positive pedal pulses.    Lab Results: Basic Metabolic Panel:  Basename 01/26/12 0513 01/25/12 0800  NA 134* 134*  K 4.4 4.1  CL 108 107  CO2 16* 17*  GLUCOSE 81 98  BUN 23 17  CREATININE 2.00* 1.84*  CALCIUM 7.9* 7.8*  MG -- --  PHOS -- --   CBC:  Basename 01/26/12 0513 01/25/12 0800  WBC 4.1 4.7  NEUTROABS -- --  HGB 9.2* 9.3*  HCT 27.2* 26.7*  MCV 86.9 85.9  PLT 123* 125*   Urine Drug Screen: Drugs of Abuse     Component Value Date/Time   LABOPIA NONE DETECTED 01/22/2012 1435   COCAINSCRNUR NONE DETECTED 01/22/2012 1435   LABBENZ NONE DETECTED 01/22/2012 1435   AMPHETMU NONE DETECTED 01/22/2012 1435   THCU NONE DETECTED 01/22/2012 1435   LABBARB NONE DETECTED 01/22/2012 1435     Recent Results (from the past 240 hour(s))  CULTURE, BLOOD (ROUTINE X 2)     Status: Normal (Preliminary result)   Collection Time   01/22/12  9:42 PM      Component Value Range Status Comment   Specimen Description BLOOD RIGHT HAND   Final    Special  Requests BOTTLES DRAWN AEROBIC AND ANAEROBIC 10CC   Final    Culture  Setup Time 01/23/2012 04:17   Final    Culture     Final    Value:        BLOOD CULTURE RECEIVED NO GROWTH TO DATE CULTURE WILL BE HELD FOR 5 DAYS BEFORE ISSUING A FINAL NEGATIVE REPORT   Report Status PENDING   Incomplete   CULTURE, BLOOD (ROUTINE X 2)     Status: Normal (Preliminary result)   Collection Time   01/22/12  9:52 PM      Component Value Range Status Comment   Specimen Description BLOOD LEFT FOREARM   Final    Special Requests BOTTLES DRAWN AEROBIC AND ANAEROBIC 10CC   Final    Culture  Setup Time 01/23/2012 04:17   Final    Culture     Final    Value:        BLOOD CULTURE RECEIVED NO GROWTH TO DATE CULTURE WILL BE HELD FOR 5 DAYS BEFORE ISSUING A FINAL NEGATIVE REPORT   Report Status PENDING   Incomplete   URINE CULTURE     Status: Normal   Collection Time   01/23/12  6:02 AM      Component Value Range Status Comment   Specimen Description URINE, RANDOM   Final  Special Requests NONE   Final    Culture  Setup Time 01/23/2012 20:19   Final    Colony Count 20,OOO COLONIES/ML   Final    Culture     Final    Value: Multiple bacterial morphotypes present, none predominant. Suggest appropriate recollection if clinically indicated.   Report Status 01/24/2012 FINAL   Final   CSF CULTURE     Status: Normal (Preliminary result)   Collection Time   01/25/12  9:30 AM      Component Value Range Status Comment   Specimen Description CSF   Final    Special Requests Immunocompromised   Final    Gram Stain     Final    Value: MODERATE WBC PRESENT, PREDOMINANTLY MONONUCLEAR     NO ORGANISMS SEEN   Culture PENDING   Incomplete    Report Status PENDING   Incomplete   GRAM STAIN     Status: Normal   Collection Time   01/25/12  9:30 AM      Component Value Range Status Comment   Specimen Description CSF   Final    Special Requests Immunocompromised   Final    Gram Stain     Final    Value: CYTOSPIN PREP      WBC PRESENT, PREDOMINANTLY MONONUCLEAR     NO ORGANISMS SEEN   Report Status 01/25/2012 FINAL   Final     Studies/Results: Dg Fluoro Guide Lumbar Puncture  01/25/2012  *RADIOLOGY REPORT*  Clinical Data:  CNS infection.  DIAGNOSTIC LUMBAR PUNCTURE UNDER FLUOROSCOPIC GUIDANCE  Fluoroscopy time:  0.42 minutes.  Technique:  Informed consent was obtained from the patient prior to the procedure, including potential complications of headache, allergy, and pain.   With the patient prone, the lower back was prepped with Betadine.  1% Lidocaine was used for local anesthesia. Lumbar puncture was performed at the L2-3 level using a 20 gauge needle with return of slightly cloudy CSF.  21 ml of CSF were obtained for laboratory studies.  The patient tolerated the procedure well and there were no apparent complications.  IMPRESSION: Fluoroscopic guided lumbar puncture with 21 ml of slightly cloudy CSF obtained for laboratory evaluation.   Original Report Authenticated By: Rudie Meyer, M.D.     Medications: Scheduled Meds:   . amLODipine  5 mg Oral Daily  . azithromycin  1,200 mg Oral Weekly  . nicotine  14 mg Transdermal Q24H  . sodium chloride  3 mL Intravenous Q12H  . sulfamethoxazole-trimethoprim  2 tablet Oral Q12H   Continuous Infusions:   . sodium chloride 75 mL/hr at 01/24/12 1140   PRN Meds:.labetalol  Assessment/Plan:  Principal Problem:  *PNA (pneumonia) Active Problems:  HIV (human immunodeficiency virus infection)  Brain lesion  Acute renal failure  Anemia  Altered mental status  Hypokalemia   CAP/?PCP PNA -Continue levaquin/Bactrim. -Stop Levaquin after 5 days. (today) -PCP smear ordered.  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 -Initial LP studies look ok, but need to await final serologies. -Appreciate ID input.  HIV/AIDS -CD4 count 80. -HAART management as per ID. -On weekly azithro for MAC prophylaxis.  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 -Will continue to follow.  Disposition -Pt/OT recs CIR. -Will order consult. -He is not medically ready for DC yet.   Time spent coordinating care: 25 minutes.   LOS: 4 days   HERNANDEZ ACOSTA,ESTELA  [facility] Pager: [phone] 01/26/2012, 11:12 AM

## 2012-01-27 DIAGNOSIS — E872 Acidosis: Secondary | ICD-10-CM

## 2012-01-27 LAB — HERPES SIMPLEX VIRUS(HSV) DNA BY PCR
HSV 1 DNA: NOT DETECTED
HSV 2 DNA: NOT DETECTED

## 2012-01-27 LAB — BASIC METABOLIC PANEL
Calcium: 8 mg/dL — ABNORMAL LOW (ref 8.4–10.5)
GFR calc Af Amer: 44 mL/min — ABNORMAL LOW (ref >90)
GFR calc non Af Amer: 38 mL/min — ABNORMAL LOW (ref >90)
Potassium: 4.4 mEq/L (ref 3.5–5.1)
Sodium: 133 mEq/L — ABNORMAL LOW (ref 135–145)

## 2012-01-27 NOTE — Progress Notes (Signed)
[facility]             Progress Note   Subjective: No complaints.  Objective: Vital signs in last 24 hours: Temp:  [97.5 F (36.4 C)-97.6 F (36.4 C)] 97.6 F (36.4 C) (12/22 0500) Pulse Rate:  [60-85] 78  (12/22 0500) Resp:  [12-18] 16  (12/22 0500) BP: (107-149)/(70-99) 130/70 mmHg (12/22 0952) SpO2:  [94 %-100 %] 97 % (12/22 0500) Weight:  [54.386 kg (119 lb 14.4 oz)] 54.386 kg (119 lb 14.4 oz) (12/22 0500) Weight change: -0.726 kg (-1 lb 9.6 oz) Last BM Date: 01/26/12  Intake/Output from previous day: 12/21 0701 - 12/22 0700 In: 800 [P.O.:800] Out: 1300 [Urine:1300] Total I/O In: -  Out: 300 [Urine:300]   Physical Exam: General: Alert, awake, pleasant. HEENT: No bruits, no goiter. Heart: Regular rate and rhythm, without murmurs, rubs, gallops. Lungs: Clear to auscultation bilaterally. Abdomen: Soft, nontender, nondistended, positive bowel sounds. Extremities: No clubbing cyanosis or edema with positive pedal pulses.    Lab Results: Basic Metabolic Panel:  Basename 01/27/12 0450 01/26/12 0513  NA 133* 134*  K 4.4 4.4  CL 107 108  CO2 16* 16*  GLUCOSE 81 81  BUN 25* 23  CREATININE 1.97* 2.00*  CALCIUM 8.0* 7.9*  MG -- --  PHOS -- --   CBC:  Basename 01/26/12 0513 01/25/12 0800  WBC 4.1 4.7  NEUTROABS -- --  HGB 9.2* 9.3*  HCT 27.2* 26.7*  MCV 86.9 85.9  PLT 123* 125*   Urine Drug Screen: Drugs of Abuse     Component Value Date/Time   LABOPIA NONE DETECTED 01/22/2012 1435   COCAINSCRNUR NONE DETECTED 01/22/2012 1435   LABBENZ NONE DETECTED 01/22/2012 1435   AMPHETMU NONE DETECTED 01/22/2012 1435   THCU NONE DETECTED 01/22/2012 1435   LABBARB NONE DETECTED 01/22/2012 1435     Recent Results (from the past 240 hour(s))  CULTURE, BLOOD (ROUTINE X 2)     Status: Normal (Preliminary result)   Collection Time   01/22/12  9:42 PM      Component Value Range Status Comment   Specimen Description BLOOD RIGHT HAND   Final    Special Requests BOTTLES DRAWN AEROBIC AND ANAEROBIC 10CC   Final    Culture  Setup Time 01/23/2012 04:17   Final    Culture     Final    Value:        BLOOD CULTURE RECEIVED NO GROWTH TO DATE CULTURE WILL BE HELD FOR 5 DAYS BEFORE ISSUING A FINAL NEGATIVE REPORT   Report Status PENDING   Incomplete   CULTURE, BLOOD (ROUTINE X 2)     Status: Normal (Preliminary result)   Collection Time   01/22/12  9:52 PM      Component Value Range Status Comment   Specimen Description BLOOD LEFT FOREARM   Final    Special Requests BOTTLES DRAWN AEROBIC AND ANAEROBIC 10CC   Final    Culture  Setup Time 01/23/2012 04:17   Final    Culture     Final    Value:        BLOOD CULTURE RECEIVED NO GROWTH TO DATE CULTURE WILL BE HELD FOR 5 DAYS BEFORE ISSUING A FINAL NEGATIVE REPORT   Report Status PENDING   Incomplete   URINE CULTURE     Status: Normal   Collection Time   01/23/12  6:02 AM      Component Value Range Status Comment   Specimen Description URINE, RANDOM  Final    Special Requests NONE   Final    Culture  Setup Time 01/23/2012 20:19   Final    Colony Count 20,OOO COLONIES/ML   Final    Culture     Final    Value: Multiple bacterial morphotypes present, none predominant. Suggest appropriate recollection if clinically indicated.   Report Status 01/24/2012 FINAL   Final   CSF CULTURE     Status: Normal (Preliminary result)   Collection Time   01/25/12  9:30 AM      Component Value Range Status Comment   Specimen Description CSF   Final    Special Requests Immunocompromised   Final    Gram Stain     Final    Value: MODERATE WBC PRESENT, PREDOMINANTLY MONONUCLEAR     NO ORGANISMS SEEN   Culture NO GROWTH 1 DAY   Final    Report Status PENDING   Incomplete   GRAM STAIN     Status: Normal   Collection Time   01/25/12  9:30 AM      Component Value Range Status Comment   Specimen Description CSF   Final    Special Requests Immunocompromised   Final    Gram Stain     Final    Value: CYTOSPIN  PREP     WBC PRESENT, PREDOMINANTLY MONONUCLEAR     NO ORGANISMS SEEN   Report Status 01/25/2012 FINAL   Final     Studies/Results: No results found.  Medications: Scheduled Meds:    . amLODipine  5 mg Oral Daily  . azithromycin  1,200 mg Oral Weekly  . nicotine  14 mg Transdermal Q24H  . sodium chloride  3 mL Intravenous Q12H  . sulfamethoxazole-trimethoprim  2 tablet Oral Q12H   Continuous Infusions:    . sodium chloride 75 mL/hr at 01/24/12 1140   PRN Meds:.labetalol  Assessment/Plan:  Principal Problem:  *PNA (pneumonia) Active Problems:  HIV (human immunodeficiency virus infection)  Brain lesion  Acute renal failure  Anemia  Altered mental status  Hypokalemia   CAP/?PCP PNA -Has completed a course of levaquin for CAP. -Remains on bactrim for ?PCP. -PCP smear ordered. -Rest of cx data remains negative to date.  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 -Initial LP studies look ok, but need to await final serologies. -Neg studies so far: crypto CSF, VDRL CSF, HSV CSF, toxo IgM low, IgG high (does not indicate active infection). -Appreciate ID input. -Discussed case today with Dr. Orvan Falconer. Will likely need to be started on HAART soon, but will need placement.  HIV/AIDS -CD4 count 80. -Not on HAART yet. -On weekly azithro for MAC prophylaxis. -Is also on therapeutic bactrim to cover possibly PCP and toxo.  ARF/?CKD/Metabolic Acidosis -I am starting to believe he may have CKD from his HIV as his Cr seems to be hanging around 1.8-2.0. -Will continue to follow. -Will need OP renal follow up.  Disposition -Pt/OT recs CIR. -Will order consult. -Will also ask SW to start working on SNF in case CIR fails.   Time spent coordinating care: 35 minutes.   LOS: 5 days   [facility] Pager: [phone] 01/27/2012, 10:53 AM

## 2012-01-28 LAB — BASIC METABOLIC PANEL
Chloride: 108 mEq/L (ref 96–112)
GFR calc Af Amer: 41 mL/min — ABNORMAL LOW (ref >90)
GFR calc non Af Amer: 35 mL/min — ABNORMAL LOW (ref >90)
Potassium: 4.9 mEq/L (ref 3.5–5.1)
Sodium: 131 mEq/L — ABNORMAL LOW (ref 135–145)

## 2012-01-28 LAB — CBC
HCT: 29.7 % — ABNORMAL LOW (ref 39.0–52.0)
Hemoglobin: 10 g/dL — ABNORMAL LOW (ref 13.0–17.0)
MCHC: 33.7 g/dL (ref 30.0–36.0)
WBC: 4.1 10*3/uL (ref 4.0–10.5)

## 2012-01-28 LAB — CSF CULTURE W GRAM STAIN: Culture: NO GROWTH

## 2012-01-28 MED ORDER — SULFAMETHOXAZOLE-TMP DS 800-160 MG PO TABS: 2.0000 | ORAL_TABLET | Freq: Two times a day (BID) | ORAL | Status: DC

## 2012-01-28 MED ORDER — AZITHROMYCIN 600 MG PO TABS: 1200.0000 mg | ORAL_TABLET | ORAL | Status: DC

## 2012-01-28 MED ORDER — ABACAVIR SULFATE-LAMIVUDINE 600-300 MG PO TABS: 1.0000 | ORAL_TABLET | Freq: Every day | ORAL | Status: DC

## 2012-01-28 MED ORDER — AMLODIPINE BESYLATE 5 MG PO TABS: 5.0000 mg | ORAL_TABLET | Freq: Every day | ORAL | Status: DC

## 2012-01-28 MED ORDER — EMTRICITABINE-TENOFOVIR DF 200-300 MG PO TABS
1.0000 | ORAL_TABLET | ORAL | Status: DC
  Administered 2012-01-28: 1 via ORAL
  Filled 2012-01-28: qty 1

## 2012-01-28 MED ORDER — DOLUTEGRAVIR SODIUM 50 MG PO TABS
50.0000 mg | ORAL_TABLET | Freq: Every day | ORAL | Status: DC
  Administered 2012-01-28 – 2012-01-29 (×2): 50 mg via ORAL
  Filled 2012-01-28 (×2): qty 1

## 2012-01-28 NOTE — Progress Notes (Signed)
[facility]  Day # 7 antibiotics Day #7  TMP/SMX  Subjective: No new complaints   Antibiotics:  Anti-infectives     Start     Dose/Rate Route Frequency Ordered Stop   01/28/12 1100   emtricitabine-tenofovir (TRUVADA) 200-300 MG per tablet 1 tablet        1 tablet Oral Every 48 hours 01/28/12 0955     01/28/12 1100   Dolutegravir Sodium TABS 50 mg        50 mg Oral Daily 01/28/12 1017     01/28/12 1000   abacavir-lamiVUDine (EPZICOM) 600-300 MG per tablet 1 tablet  Status:  Discontinued        1 tablet Oral Daily 01/28/12 1000 01/28/12 1000   01/28/12 0000   azithromycin (ZITHROMAX) 600 MG tablet        1,200 mg Oral Weekly 01/28/12 0948     01/28/12 0000  sulfamethoxazole-trimethoprim (BACTRIM DS) 800-160 MG per tablet       2 tablet Oral Every 12 hours 01/28/12 0948     01/25/12 2200   levofloxacin (LEVAQUIN) IVPB 750 mg        750 mg 100 mL/hr over 90 Minutes Intravenous Every 48 hours 01/24/12 1256 01/25/12 2352   01/25/12 2200  sulfamethoxazole-trimethoprim (BACTRIM DS) 800-160 MG per tablet 2 tablet       2 tablet Oral Every 12 hours 01/25/12 1546     01/24/12 1415   sulfamethoxazole-trimethoprim (BACTRIM) 320 mg in dextrose 5 % 500 mL IVPB  Status:  Discontinued        320 mg 346.7 mL/hr over 90 Minutes Intravenous 3 times per day 01/24/12 1403 01/25/12 1559   01/24/12 1000   azithromycin (ZITHROMAX) tablet 1,200 mg        1,200 mg Oral Weekly 01/24/12 0813     01/22/12 2300   sulfamethoxazole-trimethoprim (BACTRIM) 340 mg in dextrose 5 % 500 mL IVPB  Status:  Discontinued        340 mg 347.5 mL/hr over 90 Minutes Intravenous 3 times per day 01/22/12 2201 01/24/12 1403   01/22/12 2300   levofloxacin (LEVAQUIN) IVPB 750 mg  Status:  Discontinued        750 mg 100 mL/hr over 90 Minutes Intravenous Every 24 hours 01/22/12 2201 01/24/12 1256   01/22/12 2200   efavirenz-emtricitabine-tenofovir (ATRIPLA) 600-200-300 MG per tablet 1  tablet  Status:  Discontinued        1 tablet Oral Daily at bedtime 01/22/12 2118 01/23/12 1320          Medications: Scheduled Meds:    . amLODipine  5 mg Oral Daily  . azithromycin  1,200 mg Oral Weekly  . Dolutegravir Sodium  50 mg Oral Daily  . emtricitabine-tenofovir  1 tablet Oral Q48H  . nicotine  14 mg Transdermal Q24H  . sodium chloride  3 mL Intravenous Q12H  . sulfamethoxazole-trimethoprim  2 tablet Oral Q12H   Continuous Infusions:    . sodium chloride 75 mL/hr at 01/24/12 1140   PRN Meds:.labetalol   Objective: Weight change: 1.6 oz (0.045 kg)  Intake/Output Summary (Last 24 hours) at 01/28/12 1629 Last data filed at 01/28/12 1328  Gross per 24 hour  Intake    240 ml  Output    500 ml  Net   -260 ml   Blood pressure 108/68, pulse 95, temperature 98.3 F (36.8 C), temperature source Oral, resp. rate 16, height 5\' 11"  (1.803 m), weight 120 lb (  54.432 kg), SpO2 99.00%. Temp:  [97.7 F (36.5 C)-98.3 F (36.8 C)] 98.3 F (36.8 C) (12/23 1325) Pulse Rate:  [62-95] 95  (12/23 1545) Resp:  [16] 16  (12/23 1325) BP: (108-134)/(59-80) 108/68 mmHg (12/23 1325) SpO2:  [96 %-99 %] 99 % (12/23 1545) Weight:  [120 lb (54.432 kg)] 120 lb (54.432 kg) (12/23 0500)  Physical Exam: General: Alert and awake, oriented x3, not in any acute distress.  HEENT: anicteric sclera, pupils reactive to light and accommodation, EOMI, oropharynx some coating on the tongue CVS regular rate, normal r, no murmur rubs or gallops  Chest: Few crackles at the bases but otherwise clear to auscultation bilaterally, Abdomen: soft nontender, nondistended, normal bowel sounds,  Extremities: no clubbing or edema noted bilaterally  Skin: no rashes  Neuro: Patient has 4 or 5 strength on the right hip flexion as well as flexion extension around the knee. Dorsiflexion is also diminished at 3-4/5 flexion 4-5 in the right strength on the left side is 5 out of 5 throughout the left lower tremor the.  Upper x-ray strength is symmetrical. Cranial nerves II through XII are grossly intact and cerebellar function is intact.  Lab Results:  [facility] 01/28/12 0530 01/26/12 0513  WBC 4.1 4.1  HGB 10.0* 9.2*  HCT 29.7* 27.2*  PLT 138* 123*    BMET  Basename 01/28/12 0530 01/27/12 0450  NA 131* 133*  K 4.9 4.4  CL 108 107  CO2 15* 16*  GLUCOSE 87 81  BUN 29* 25*  CREATININE 2.08* 1.97*  CALCIUM 8.5 8.0*    Micro Results: Recent Results (from the past 240 hour(s))  CULTURE, BLOOD (ROUTINE X 2)     Status: Normal (Preliminary result)   Collection Time   01/22/12  9:42 PM      Component Value Range Status Comment   Specimen Description BLOOD RIGHT HAND   Final    Special Requests BOTTLES DRAWN AEROBIC AND ANAEROBIC 10CC   Final    Culture  Setup Time 01/23/2012 04:17   Final    Culture     Final    Value:        BLOOD CULTURE RECEIVED NO GROWTH TO DATE CULTURE WILL BE HELD FOR 5 DAYS BEFORE ISSUING A FINAL NEGATIVE REPORT   Report Status PENDING   Incomplete   CULTURE, BLOOD (ROUTINE X 2)     Status: Normal (Preliminary result)   Collection Time   01/22/12  9:52 PM      Component Value Range Status Comment   Specimen Description BLOOD LEFT FOREARM   Final    Special Requests BOTTLES DRAWN AEROBIC AND ANAEROBIC 10CC   Final    Culture  Setup Time 01/23/2012 04:17   Final    Culture     Final    Value:        BLOOD CULTURE RECEIVED NO GROWTH TO DATE CULTURE WILL BE HELD FOR 5 DAYS BEFORE ISSUING A FINAL NEGATIVE REPORT   Report Status PENDING   Incomplete   URINE CULTURE     Status: Normal   Collection Time   01/23/12  6:02 AM      Component Value Range Status Comment   Specimen Description URINE, RANDOM   Final    Special Requests NONE   Final    Culture  Setup Time 01/23/2012 20:19   Final    Colony Count 20,OOO COLONIES/ML   Final    Culture     Final    Value:  Multiple bacterial morphotypes present, none predominant. Suggest appropriate recollection if clinically  indicated.   Report Status 01/24/2012 FINAL   Final   CSF CULTURE     Status: Normal   Collection Time   01/25/12  9:30 AM      Component Value Range Status Comment   Specimen Description CSF   Final    Special Requests Immunocompromised   Final    Gram Stain     Final    Value: MODERATE WBC PRESENT, PREDOMINANTLY MONONUCLEAR     NO ORGANISMS SEEN   Culture NO GROWTH 3 DAYS   Final    Report Status 01/28/2012 FINAL   Final   GRAM STAIN     Status: Normal   Collection Time   01/25/12  9:30 AM      Component Value Range Status Comment   Specimen Description CSF   Final    Special Requests Immunocompromised   Final    Gram Stain     Final    Value: CYTOSPIN PREP     WBC PRESENT, PREDOMINANTLY MONONUCLEAR     NO ORGANISMS SEEN   Report Status 01/25/2012 FINAL   Final     Studies/Results: No results found.    Assessment/Plan: Jay MINNER is a 51 y.o. 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    #1 brain lesions: This patient is certainly at risk for multiple opportunistic infections including cryptococcal meningitis though his coccal antigen is negative in serum. He is certain he also at risk for toxoplasmosis, CMV infection, p.m. L. due to JC virus as well as primary CNS lymphoma.MRI sounds less like classic Toxo or primary CNS lymphoma and lesions could even be related to ischemia. LP with many RBC few WBC. I do not see CSF protein or glucose. His CSF crypto ag, HSV VDRL negative.  Serum Toxo IgG positive (IgM fwiw --not much negative),   CSF for  for toxoplasma PCR pending , VZV PCR pending  I DONT SEE THAT CMV by PCR is being run    --apparently he was evaluated for Rehab and now is refusing. I think pt should go to some  type of structured care such as SNF if he refuses rehab inpatient   #2 ? PCP vs CAP: LDH normal. Ch --continue bactrim and complete 21 day course then one tablet daily for prophylaxis   #3 HIV/AIDS: CD4 80 --HIV genotype pending as is HLA [id number]  It appears pt has been started on daily Tivicay and QOD truvada --he will need close followup as an outpt in [facility], He is at risk for IRIS   --Make sure he had [place]icaid otherwise would need emergency ADAP   #4 OI: contnue azithromycin weekly for prophlaxis.  Dr. Orvan Falconer available on the weekend for questions.      LOS: 6 days   Acey Lav 01/28/2012, 4:29 PM

## 2012-01-28 NOTE — Consult Note (Signed)
Physical Medicine and Rehabilitation Consult Reason for Consult: Pneumonia/HIV Referring Physician: [facility]   HPI: Jay Flores is a 51 y.o. right-handed male with history of HIV had not been seen for nearly a decade having last seen Dr. Ninetta Lights in 2004. Patient independent with a cane prior to admission living with his sister. Patient presented [facility] 01/22/2012 with a two-week history of cough, dyspnea on exertion and  weakness in the lower extremity with foot drop as well as altered mental status. Cranial CT scan showed multiple hypodense lesions in chest x-ray with bilateral infiltrates. Patient was placed on broad-spectrum antibiotics. There was questionable compliance to HIV meds prior to admission. CD4 count of 80 on admission. MRI of the brain later completed showing multiple foci of her prescription to diffusion within the white matter regions of the thalami and left paracentral pons. MRA of the head with moderate stenosis of the dominant left vertebral reactive dural margin. Infectious disease was consulted. Lumbar puncture completed with full results pending.HAART to be resumed per infectious disease. Patient did complete a course of Levaquin for CAP. Close monitoring of renal function at baseline creatinine 1.66-2.00. Physical and occupational therapy evaluations completed ongoing patient noted to have ataxic gait. Recommendations have been made for physical medicine rehabilitation consult to consider inpatient rehabilitation services  Patient states that he doesn't feel much different than usual. He states his virus has caused some leg problems for him and uses a cane at home. He is with his sister except when she works she goes to his mother's place  Review of Systems  Constitutional: Positive for chills.  Respiratory: Positive for cough.   Musculoskeletal: Positive for myalgias and back pain.  Neurological: Positive for weakness.  All other systems reviewed and are negative.   Past  Medical History  Diagnosis Date  . HIV (human immunodeficiency virus infection)   . Altered mental status 01/20/2012  . Acute renal failure    Past Surgical History  Procedure Date  . No past surgeries    History reviewed. No pertinent family history. Social History:  reports that he has been smoking Cigarettes.  He has a 34 pack-year smoking history. He has never used smokeless tobacco. He reports that he uses illicit drugs (Cocaine). He reports that he does not drink alcohol. Allergies: No Known Allergies Medications Prior to Admission  Medication Sig Dispense Refill  . efavirenz-emtricitabine-tenofovir (ATRIPLA) 600-200-300 MG per tablet Take 1 tablet by mouth at bedtime.        Home: Home Living Lives With: Other (Comment) (sister who works, another sis next door can assist) Available Help at Discharge: Family Type of Home: House Home Access: Stairs to enter Secretary/administrator of Steps: several Entrance Stairs-Rails: Left;Right Home Layout: Two level Alternate Level Stairs-Number of Steps: flight Alternate Level Stairs-Rails: Left;Right Bathroom Shower/Tub: Engineer, manufacturing systems: Standard Home Adaptive Equipment: Paediatric nurse with back;Straight cane  Functional History: Prior Function Able to Take Stairs?: Yes Driving: No Functional Status:  Mobility: Bed Mobility Bed Mobility: Supine to Sit;Sit to Supine Rolling Right: 5: Supervision Right Sidelying to Sit: 5: Supervision;HOB flat Supine to Sit: 6: Modified independent (Device/Increase time);HOB elevated Sitting - Scoot to Edge of Bed: 5: Supervision Sit to Supine: 6: Modified independent (Device/Increase time) Transfers Transfers: Sit to Stand;Stand to Sit Sit to Stand: 4: Min assist Stand to Sit: 4: Min assist Ambulation/Gait Ambulation/Gait Assistance: 3: Mod assist Ambulation Distance (Feet): 40 Feet Assistive device: 1 person hand held assist (iv pole) Ambulation/Gait Assistance Details:  ataxic  gait with narrowed BOS (stepping on his feet).  Not as able to do heel/toe pattern Gait Pattern: Step-to pattern;Step-through pattern;Decreased step length - right;Decreased step length - left;Decreased stride length;Ataxic Gait velocity: slow, deliberate cadence Wheelchair Mobility Wheelchair Mobility: No  ADL: ADL Grooming: Wash/dry face;Minimal assistance Where Assessed - Grooming: Supported standing (UE support on chair) Upper Body Bathing: Minimal assistance Where Assessed - Upper Body Bathing: Supported sitting Lower Body Bathing: Moderate assistance Where Assessed - Lower Body Bathing: Supported sit to stand;Supported standing Lower Body Dressing: Moderate assistance Where Assessed - Lower Body Dressing: Supported sit to Pharmacist, hospital: Minimal assistance Toilet Transfer Method: Sit to Barista: Bedside commode Equipment Used: Gait belt Transfers/Ambulation Related to ADLs: Min A sit to stand from bed x2. Mod A to weight shift lt/rt as pt with strong right lateral lean. Unsafe to attempt ambulation with +1 this eval ADL Comments: Pt with strong right lean. Required Mod/Max assist to weight shift left, pt unable to maintain greater than 5sec.   Cognition: Cognition Arousal/Alertness: Awake/alert Orientation Level: Oriented X4 Cognition Overall Cognitive Status: Appears within functional limits for tasks assessed/performed Arousal/Alertness: Awake/alert Orientation Level: Disoriented to;Time Behavior During Session: WFL for tasks performed  Blood pressure 134/80, pulse 81, temperature 98.1 F (36.7 C), temperature source Oral, resp. rate 16, height 5\' 11"  (1.803 m), weight 54.432 kg (120 lb), SpO2 97.00%. Physical Exam  Vitals reviewed. Constitutional: He is oriented to person, place, and time.       51 year old black male appearing older than stated age.  HENT:  Head: Normocephalic.  Eyes:       Pupils round and reactive to light   Neck: Neck supple. No thyromegaly present.  Cardiovascular: Normal rate and regular rhythm.   Pulmonary/Chest:       Decreased breath sounds at the bases but clear to auscultation  Abdominal: Soft. Bowel sounds are normal. He exhibits no distension.  Neurological: He is alert and oriented to person, place, and time.       Follows three-step commands  Skin: Skin is warm and dry.  Psychiatric: He has a normal mood and affect.  Motor strength is 4/5 in bilateral deltoid, biceps, triceps, grip, hip flexor, and ankle dorsiflexor plantar flexor knee extensors are 4 minus do to bilateral hamstring contractures Sensation is reported as normal to light touch in both lower extremity is Extremities are cachectic Standing balance dynamic not tested patient refuses to walk. Sit to stand is supervision needs to hold onto IV pole to maintain balance  No results found for this or any previous visit (from the past 24 hour(s)). No results found.  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 1. Does the need for close, 24 hr/day medical supervision in concert with the patient's rehab needs make it unreasonable for this patient to be served in a less intensive setting? Potentially 2. Co-Morbidities requiring supervision/potential complications: Pneumonia, HIV, acute renal failure 3. Due to bladder management, bowel management, safety, skin/wound care, disease management, medication administration and patient education, does the patient require 24 hr/day rehab nursing? Potentially 4. Does the patient require coordinated care of a physician, rehab nurse, PT (1-2 hrs/day, 5 days/week) and OT (1-2 hrs/day, 5 days/week) to address physical and functional deficits in the context of the above medical diagnosis(es)? Potentially Addressing deficits in the following areas: balance, endurance, locomotion, strength, transferring, bathing, dressing and  toileting 5. Can the patient actively participate in an intensive therapy program of  at least 3 hrs of therapy per day at least 5 days per week? patient states that he does not want to do any walking 6. The potential for patient to make measurable gains while on inpatient rehab is poor 7. Anticipated functional outcomes upon discharge from inpatient rehab are Supervision to minimal cyst with PT, Supervision min with OT, Not applicable with SLP. 8. Estimated rehab length of stay to reach the above functional goals is: Not applicable 9. Does the patient have adequate social supports to accommodate these discharge functional goals? Yes 10. Anticipated D/C setting: Home 11. Anticipated post D/C treatments: HH therapy 12. Overall Rehab/Functional Prognosis: fair  RECOMMENDATIONS: This patient's condition is appropriate for continued rehabilitative care in the following setting: [facility] Patient has agreed to participate in recommended program. No Note that insurance prior authorization may be required for reimbursement for recommended care.  Comment:Patient is refusing therapy program. He may be close to his baseline level. He does have evidence of chronic knee contractures do to chronic immobility    01/28/2012

## 2012-01-28 NOTE — Progress Notes (Signed)
Occupational Therapy Treatment Patient Details Name: Jay Flores MRN: [id number] DOB: 27-Feb-1960 Today's Date: 01/28/2012 Time: 4098-1191 OT Time Calculation (min): 20 min  OT Assessment / Plan / Recommendation Comments on Treatment Session Pt still with increased LE weakness and decreased balance.  Currently needs use of a RW for increased support and min assist.  He has refused CIR so definately feel if he goes home he will need 24 hour supervision and continued HHOT.    Follow Up Recommendations  Home health OT    Barriers to Discharge       Equipment Recommendations  Other (comment) (RW)    Recommendations for Other Services    Frequency Min 2X/week   Plan Discharge plan needs to be updated    Precautions / Restrictions Precautions Precautions: Fall Restrictions Weight Bearing Restrictions: No   Pertinent Vitals/Pain HR 95 BPM, O2 sats 98% on room air    ADL  Lower Body Dressing: Performed;Other (comment);Minimal assistance (Pt donned shoes in sitting.) Where Assessed - Lower Body Dressing: Supported sit to stand Toilet Transfer: Mining engineer Method: Other (comment) (ambulating with RW) Toilet Transfer Equipment: Other (comment) (to bedside chair and to EOB) Transfers/Ambulation Related to ADLs: Pt able to perform functional transfers and sit to stand with min assist using a RW. ADL Comments: Pt with decreased efficiency with mobility.  Demonstrates increased difficulty advancing his LEs for adequate step length as well.  Pt declined any selfcare tasks at the sink or toileting tasks.  Agreed to get up and walk with therapy this session.    OT Goals ADL Goals ADL Goal: Lower Body Dressing - Progress: Progressing toward goals ADL Goal: Toilet Transfer - Progress: Progressing toward goals ADL Goal: Toileting - Clothing Manipulation - Progress: Progressing toward goals ADL Goal: Toileting - Hygiene - Progress: Progressing toward  goals ADL Goal: Additional Goal #1 - Progress: Progressing toward goals  Visit Information  Last OT Received On: 01/28/12 Assistance Needed: +1    Subjective Data  Subjective: Pt reports his legs being weak. Patient Stated Goal: To go home      Cognition  Overall Cognitive Status: Appears within functional limits for tasks assessed/performed Arousal/Alertness: Awake/alert Orientation Level: Appears intact for tasks assessed Behavior During Session: [facility] for tasks performed    Mobility  Shoulder Instructions Bed Mobility Bed Mobility: Supine to Sit;Sit to Supine Supine to Sit: 5: Supervision;HOB elevated Sit to Supine: 5: Supervision;HOB flat Transfers Transfers: Sit to Stand Sit to Stand: 4: Min assist;With upper extremity assist;From bed Stand to Sit: 4: Min assist;Without upper extremity assist;To bed          Balance Balance Balance Assessed: Yes Dynamic Sitting Balance Dynamic Sitting - Level of Assistance: 4: Min assist Dynamic Standing Balance Dynamic Standing - Balance Support: Right upper extremity supported;Left upper extremity supported Dynamic Standing - Level of Assistance: 4: Min assist;Other (comment) (with functional use of the RW)   End of Session OT - End of Session Equipment Utilized During Treatment: Gait belt Activity Tolerance: Patient limited by fatigue Patient left: in bed;with call bell/phone within reach    [facility] OTR/L Pager number [phone] 01/28/2012, 4:00 PM

## 2012-01-28 NOTE — Progress Notes (Signed)
Physical Therapy Treatment Patient Details Name: Jay Flores MRN: [id number] DOB: January 11, 1961 Today's Date: 01/28/2012 Time: 9629-5284 PT Time Calculation (min): 17 min  PT Assessment / Plan / Recommendation Comments on Treatment Session  Better balance today, requiring less assist with RW.    Follow Up Recommendations  Home health PT;Supervision/Assistance - 24 hour     Does the patient have the potential to tolerate intense rehabilitation     Barriers to Discharge        Equipment Recommendations  Rolling walker with 5" wheels    Recommendations for Other Services    Frequency Min 3X/week   Plan Discharge plan needs to be updated;Frequency remains appropriate;Equipment recommendations need to be updated    Precautions / Restrictions Precautions Precautions: Fall Restrictions Weight Bearing Restrictions: No   Pertinent Vitals/Pain     Mobility  Bed Mobility Bed Mobility: Not assessed Supine to Sit: 5: Supervision;HOB elevated Sit to Supine: 5: Supervision;HOB flat Transfers Transfers: Sit to Stand;Stand to Sit Sit to Stand: 4: Min guard;With upper extremity assist;With armrests;From chair/3-in-1 Stand to Sit: 4: Min guard;With upper extremity assist;To bed Details for Transfer Assistance: Verbal cues for safe hand placement.  Assist for safety only. Ambulation/Gait Ambulation/Gait Assistance: 4: Min guard Ambulation Distance (Feet): 175 Feet Assistive device: Rolling walker Ambulation/Gait Assistance Details: Verbal cues to stay close to RW and stand upright.  Difficulty with swing phase of RLE - dragging leg to advance.  Better gait pattern with RW.  Gait Pattern: Step-to pattern;Decreased step length - left;Decreased stance time - right;Ataxic;Trunk flexed Gait velocity: slow, deliberate cadence      PT Goals Acute Rehab PT Goals PT Goal: Sit to Stand - Progress: Progressing toward goal PT Transfer Goal: Bed to Chair/Chair to Bed - Progress: Progressing  toward goal PT Goal: Ambulate - Progress: Progressing toward goal  Visit Information  Last PT Received On: 01/28/12 Assistance Needed: +1    Subjective Data  Subjective: "This (walking) makes me tired"   Cognition  Overall Cognitive Status: Appears within functional limits for tasks assessed/performed Arousal/Alertness: Awake/alert Orientation Level: Appears intact for tasks assessed Behavior During Session: [facility] for tasks performed    Balance  Balance Balance Assessed: Yes Dynamic Sitting Balance Dynamic Sitting - Level of Assistance: 4: Min assist Dynamic Standing Balance Dynamic Standing - Balance Support: Right upper extremity supported;Left upper extremity supported Dynamic Standing - Level of Assistance: 4: Min assist;Other (comment) (with functional use of the RW)  End of Session PT - End of Session Equipment Utilized During Treatment: Gait belt Activity Tolerance: Patient tolerated treatment well Patient left: in bed;with call bell/phone within reach (sitting at EOB) Nurse Communication: Mobility status   GP     Vena Austria 01/28/2012, 4:44 PM Durenda Hurt. Renaldo Fiddler, [facility] Acute Rehab Services Pager [phone]

## 2012-01-28 NOTE — Progress Notes (Signed)
Clinical Social Worker met with pt at bedside.  CSW reviewed dc options-pt to dc home with home health.  CSW to sign off, please re consult if needed.   Angelia Mould, MSW, [place] [phone]

## 2012-01-28 NOTE — Progress Notes (Signed)
Patient does not want inpt rehab at this time. I will alert RN CM. [phone]

## 2012-01-28 NOTE — Discharge Summary (Addendum)
Physician Discharge Summary  GARRISON MICHIE ZOX:[id number] DOB: September 06, 1960 DOA: 01/22/2012  PCP: No primary provider on file.  Admit date: 01/22/2012 Discharge date: 01/29/2012  Time spent: 60 minutes  Recommendations for Outpatient Follow-up:  Follow up with renal in 3 weeks.  Discharge Diagnoses:  Principal Problem:  *PNA (pneumonia) Active Problems:  HIV (human immunodeficiency virus infection)  Brain lesion  Acute renal failure  Anemia  Altered mental status  Hypokalemia  Metabolic acidosis Severe protein malnutrition  Discharge Condition: stable.  Diet recommendation: regular diet  Filed Weights   01/27/12 0500 01/28/12 0500 01/29/12 8119  Weight: 54.386 kg (119 lb 14.4 oz) 54.432 kg (120 lb) 51.256 kg (113 lb)    History of present illness:  51 yo male hiv positive bought to [facility] ed for ams by his sister. Pt mental status normal now. He denies any fevers. Has been having nonprod cough for several days. No rashes. No n/v/d. He is complaining of mild le ankle edema that waxes/wanes but is resolved now. No significant calf swelling. No cp. No sob. Says he takes his hiv meds and is compliant but there is some mention elsewhere in chart that this is not the case. Transferred here to ED with ct head showing multiple hypodense lesions and cxr with bilateral infiltrates.    Hospital Course:  PNA (pneumonia) (01/22/2012) - Completed a course of levaquin for CAP.  - Remains on empiric treatment bactrim for PCP.  - PCP smear ordered.  - Rest of cx data remains negative to date.   HIV (human immunodeficiency virus infection) (01/22/2012) - resume HAART.  - genotyping pending follow up as an outpatient.   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  - Initial LP studies look : traumatic, 2 WBC, glucose 47 and protein 38.  - Neg studies Serology: crypto CSF, VDRL CSF, HSV CSF, toxo IgM low, IgG high.  - Will likely need  need placement.  Patient refused CIR and SNF. Will like to go home.  ARF/CKD/Metabolic Acidosis  - ? CKD from his HIV as his Cr seems to be hanging around 1.8-2.0, despite IV fluids. No previous Cr to compsre.  - Will need OP renal follow up as an outpatient.   Hypokalemia (01/23/2012) - resolved. With replete ? Decrease intravascular vol   Procedures:  none  Consultations:  ID  Discharge Exam: Filed Vitals:   01/28/12 1325 01/28/12 1545 01/28/12 2035 01/29/12 0613  BP: 108/68  134/76 130/74  Pulse: 94 95 81 80  Temp: 98.3 F (36.8 C)  97.8 F (36.6 C) 97.9 F (36.6 C)  TempSrc: Oral  Oral Oral  Resp: 16  18   Height:      Weight:    51.256 kg (113 lb)  SpO2: 99% 99% 97% 99%    General: A & O x3, feels great Cardiovascular: RRR Respiratory: good air movement CTA  Discharge Instructions      Discharge Orders    Future Orders Please Complete By Expires   Diet - low sodium heart healthy      Increase activity slowly          Medication List     As of 01/29/2012  8:11 AM    STOP taking these medications         efavirenz-emtricitabine-tenofovir 600-200-300 MG per tablet   Commonly known as: ATRIPLA      TAKE these medications         amLODipine 5  MG tablet   Commonly known as: NORVASC   Take 1 tablet (5 mg total) by mouth daily.      azithromycin 600 MG tablet   Commonly known as: ZITHROMAX   Take 2 tablets (1,200 mg total) by mouth once a week.      Dolutegravir Sodium 50 MG Tabs   Take 1 tablet (50 mg total) by mouth daily.      emtricitabine-tenofovir 200-300 MG per tablet   Commonly known as: TRUVADA   Take 1 tablet by mouth every other day.      sulfamethoxazole-trimethoprim 800-160 MG per tablet   Commonly known as: BACTRIM DS   Take 2 tablets by mouth every 12 (twelve) hours.         Follow-up Information    Follow up with Acey Lav, MD. In 1 week. (hospital follow up)    Contact information:   [address] [place] [address] [phone]           The results of significant diagnostics from this hospitalization (including imaging, microbiology, ancillary and laboratory) are listed below for reference.    Significant Diagnostic Studies: Dg Chest 2 View  01/22/2012  *RADIOLOGY REPORT*  Clinical Data: Altered mental status, right leg weakness, history HIV  CHEST - 2 VIEW  Comparison: None  Findings: Borderline enlargement of cardiac silhouette. Mediastinal contours normal. Peribronchial thickening with suspected mild perihilar infiltrates. No segmental consolidation or pneumothorax. Tiny bibasilar effusions. Bones unremarkable.  IMPRESSION: Bronchitic changes with suspected mild bilateral perihilar infiltrates.   Original Report Authenticated By: Ulyses Southward, M.D.    Ct Head Wo Contrast  01/22/2012  *RADIOLOGY REPORT*  Clinical Data: Confusion.  Altered mental status. Symptoms began Sunday.  HIV positive.  Right leg weakness.  CT HEAD WITHOUT CONTRAST  Technique:  Contiguous axial images were obtained from the base of the skull through the vertex without contrast.  Comparison: None.  Findings: There is no evidence for acute infarction, intracranial hemorrhage,   hydrocephalus, or extra-axial fluid. Premature atrophy affects the cerebral hemispheres and cerebellar white matter.  Hypodensities are noted in the bilateral thalami, left greater than right basal ganglia, and periventricular white matter. Cryptococcal infection of the brain can be associated with a somewhat similar pattern, although multiple lacunar infarcts are certainly a more common occurrence in non-HIV patients.  There is a rectangular like area of decreased attenuation in the mid pons of uncertain significance.  Osmotic demyelination syndrome could explain this finding in the appropriate clinical setting versus an inflammatory, infectious, or ischemic etiology.    If there are no contraindications, MRI brain  without and with contrast could be helpful in further evaluation.  Calvarium intact.  Clear sinuses and mastoids.  Dense lenticular opacities without visible thickening of the retina/choroid.  IMPRESSION: Bilateral thalamic, basal ganglia, and periventricular hypodensities in this HIV positive patient; opportunistic infection not excluded.  Focal area of decreased attenuation in the mid pons, also of uncertain significance.  See discussion above.  Premature cerebral and cerebellar atrophy.   Original Report Authenticated By: Davonna Belling, M.D.    Mr [facility] Wo Contrast  01/24/2012  *RADIOLOGY REPORT*  Clinical Data:  HIV.  Altered mental status.  Abnormal CT of the head.  MRI HEAD WITHOUT AND WITH CONTRAST MRA HEAD WITHOUT CONTRAST  Technique:  Multiplanar, multiecho pulse sequences of the brain and surrounding structures were obtained without and with intravenous contrast.  Angiographic images of the head were obtained using  MRA technique without contrast.  Contrast: 11mL MULTIHANCE GADOBENATE DIMEGLUMINE 529 MG/ML IV SOLN  Comparison:  CT head without contrast 01/22/2012.  MRI HEAD WITHOUT AND WITH CONTRAST  Findings:  The diffusion weighted images demonstrate multiple foci of restricted diffusion within the thalamus on the right, the posterior internal capsule on the left, the left splenium of the corpus callosum and the posterior right periventricular white matter. A punctate diffusion abnormalities present within the left paracentral pons.  There is a focus of T1 shortening along the posterior sylvian fissure compatible with focal hemorrhage.  An adjacent area of more remote hemorrhage is noted as well.  The patient has extensive atrophy and white matter disease, advanced for age. Multiple T2 hyperintensities are present within the basal ganglia bilaterally, in addition to the areas of restricted diffusion.  The postcontrast images demonstrate no pathologic enhancement.  T1 shortening is noted in the same  area of the posterior right sylvian fissure. The ventricles are of normal size.  No fluid levels are evident.  Flow is present in the major intracranial arteries.  The globes and orbits are intact.  Mild mucosal thickening is present in the anterior ethmoid air cells with minimal mucosal thickening in the frontal and maxillary sinuses bilaterally.  The sphenoid sinuses are clear.  The mastoid air cells are mostly clear as well.  IMPRESSION:  1.  Multiple foci of restricted diffusion as described within the white matter regions, of the thalami, and the left paracentral pons. The differential diagnosis includes focal ischemic lesions, septic emboli, or a typical infection including cryptococcus or CMV.  Cryptococcal or CMV infection should be highly considered in this patient with depressed means system.  The lack of enhancement argues against septic emboli, fungal infection, or primary CNS lymphoma.  These are atypical for primary CNS lymphoma lesions as well.  2.  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 3.  Focal T1 shortening along the right posterior sylvian fissure compatible with focal hemorrhage.  An adjacent area of more remote hemorrhage is noted. 4.  Minimal sinus disease.  MRA HEAD  Findings: Mild irregularities noted throughout the internal carotid arteries bilaterally without a focal stenosis.  Although this may in part be exaggerated by patient motion, this suggests some degree of vasculitis.  The right A1 segment is dominant.  Small and medium vessel irregularity is noted bilaterally.  There is narrowing of the inferior M3 branches bilaterally.  The left vertebral artery is the dominant vessel.  A moderate focal stenosis is present at the dural margin of the left vertebral artery.  The left posterior cerebral artery is of fetal type. Similar medium and small vessel irregularity is present.  IMPRESSION:  1.  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 2.  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  Calcifications are present on the CT scan.  This could be the source of embolic disease or ischemic disease. 3.  Moderate stenoses within the inferior M3 three branches bilaterally.   Original Report Authenticated By: Marin Roberts, M.D.    Mr [facility] Wo Contrast  01/24/2012  *RADIOLOGY REPORT*  Clinical Data:  HIV.  Altered mental status.  Abnormal CT of the head.  MRI HEAD WITHOUT AND WITH CONTRAST MRA HEAD WITHOUT CONTRAST  Technique:  Multiplanar, multiecho pulse sequences of the brain and surrounding structures were obtained without and with intravenous contrast.  Angiographic images of the head  were obtained using MRA technique without contrast.  Contrast: 11mL MULTIHANCE GADOBENATE DIMEGLUMINE 529 MG/ML IV SOLN  Comparison:  CT head without contrast 01/22/2012.  MRI HEAD WITHOUT AND WITH CONTRAST  Findings:  The diffusion weighted images demonstrate multiple foci of restricted diffusion within the thalamus on the right, the posterior internal capsule on the left, the left splenium of the corpus callosum and the posterior right periventricular white matter. A punctate diffusion abnormalities present within the left paracentral pons.  There is a focus of T1 shortening along the posterior sylvian fissure compatible with focal hemorrhage.  An adjacent area of more remote hemorrhage is noted as well.  The patient has extensive atrophy and white matter disease, advanced for age. Multiple T2 hyperintensities are present within the basal ganglia bilaterally, in addition to the areas of restricted diffusion.  The postcontrast images demonstrate no pathologic enhancement.  T1 shortening is noted in the same area of the posterior right sylvian fissure. The ventricles are of normal size.  No fluid levels are evident.  Flow is present in the major intracranial arteries.  The globes and orbits  are intact.  Mild mucosal thickening is present in the anterior ethmoid air cells with minimal mucosal thickening in the frontal and maxillary sinuses bilaterally.  The sphenoid sinuses are clear.  The mastoid air cells are mostly clear as well.  IMPRESSION:  1.  Multiple foci of restricted diffusion as described within the white matter regions, of the thalami, and the left paracentral pons. The differential diagnosis includes focal ischemic lesions, septic emboli, or a typical infection including cryptococcus or CMV.  Cryptococcal or CMV infection should be highly considered in this patient with depressed means system.  The lack of enhancement argues against septic emboli, fungal infection, or primary CNS lymphoma.  These are atypical for primary CNS lymphoma lesions as well.  2.  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 3.  Focal T1 shortening along the right posterior sylvian fissure compatible with focal hemorrhage.  An adjacent area of more remote hemorrhage is noted. 4.  Minimal sinus disease.  MRA HEAD  Findings: Mild irregularities noted throughout the internal carotid arteries bilaterally without a focal stenosis.  Although this may in part be exaggerated by patient motion, this suggests some degree of vasculitis.  The right A1 segment is dominant.  Small and medium vessel irregularity is noted bilaterally.  There is narrowing of the inferior M3 branches bilaterally.  The left vertebral artery is the dominant vessel.  A moderate focal stenosis is present at the dural margin of the left vertebral artery.  The left posterior cerebral artery is of fetal type. Similar medium and small vessel irregularity is present.  IMPRESSION:  1.  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 2.  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  Calcifications are present on the CT scan.  This could be the source  of embolic disease or ischemic disease. 3.  Moderate stenoses within the inferior M3 three branches bilaterally.   Original Report Authenticated By: Marin Roberts, M.D.    Dg Fluoro Guide Lumbar Puncture  01/25/2012  *RADIOLOGY REPORT*  Clinical Data:  CNS infection.  DIAGNOSTIC LUMBAR PUNCTURE UNDER FLUOROSCOPIC GUIDANCE  Fluoroscopy time:  0.42 minutes.  Technique:  Informed consent was obtained from the patient prior to the procedure, including potential complications of headache, allergy, and pain.   With the patient prone, the lower  back was prepped with Betadine.  1% Lidocaine was used for local anesthesia. Lumbar puncture was performed at the L2-3 level using a 20 gauge needle with return of slightly cloudy CSF.  21 ml of CSF were obtained for laboratory studies.  The patient tolerated the procedure well and there were no apparent complications.  IMPRESSION: Fluoroscopic guided lumbar puncture with 21 ml of slightly cloudy CSF obtained for laboratory evaluation.   Original Report Authenticated By: Rudie Meyer, M.D.     Microbiology: Recent Results (from the past 240 hour(s))  CULTURE, BLOOD (ROUTINE X 2)     Status: Normal   Collection Time   01/22/12  9:42 PM      Component Value Range Status Comment   Specimen Description BLOOD RIGHT HAND   Final    Special Requests BOTTLES DRAWN AEROBIC AND ANAEROBIC 10CC   Final    Culture  Setup Time 01/23/2012 04:17   Final    Culture NO GROWTH 5 DAYS   Final    Report Status 01/29/2012 FINAL   Final   CULTURE, BLOOD (ROUTINE X 2)     Status: Normal   Collection Time   01/22/12  9:52 PM      Component Value Range Status Comment   Specimen Description BLOOD LEFT FOREARM   Final    Special Requests BOTTLES DRAWN AEROBIC AND ANAEROBIC 10CC   Final    Culture  Setup Time 01/23/2012 04:17   Final    Culture NO GROWTH 5 DAYS   Final    Report Status 01/29/2012 FINAL   Final   URINE CULTURE     Status: Normal   Collection Time   01/23/12   6:02 AM      Component Value Range Status Comment   Specimen Description URINE, RANDOM   Final    Special Requests NONE   Final    Culture  Setup Time 01/23/2012 20:19   Final    Colony Count 20,OOO COLONIES/ML   Final    Culture     Final    Value: Multiple bacterial morphotypes present, none predominant. Suggest appropriate recollection if clinically indicated.   Report Status 01/24/2012 FINAL   Final   CSF CULTURE     Status: Normal   Collection Time   01/25/12  9:30 AM      Component Value Range Status Comment   Specimen Description CSF   Final    Special Requests Immunocompromised   Final    Gram Stain     Final    Value: MODERATE WBC PRESENT, PREDOMINANTLY MONONUCLEAR     NO ORGANISMS SEEN   Culture NO GROWTH 3 DAYS   Final    Report Status 01/28/2012 FINAL   Final   GRAM STAIN     Status: Normal   Collection Time   01/25/12  9:30 AM      Component Value Range Status Comment   Specimen Description CSF   Final    Special Requests Immunocompromised   Final    Gram Stain     Final    Value: CYTOSPIN PREP     WBC PRESENT, PREDOMINANTLY MONONUCLEAR     NO ORGANISMS SEEN   Report Status 01/25/2012 FINAL   Final      Labs: Basic Metabolic Panel:  Lab 01/28/12 1610 01/27/12 0450 01/26/12 0513 01/25/12 0800 01/24/12 0607  NA 131* 133* 134* 134* 135  K 4.9 4.4 4.4 4.1 4.0  CL 108 107 108 107 110  CO2 15* 16* 16* 17* 18*  GLUCOSE 87 81 81 98 97  BUN 29* 25* 23 17 23   CREATININE 2.08* 1.97* 2.00* 1.84* 1.92*  CALCIUM 8.5 8.0* 7.9* 7.8* 7.9*  MG -- -- -- -- --  PHOS -- -- -- -- --   Liver Function Tests:  Lab 01/22/12 1335  AST 50*  ALT 24  ALKPHOS 65  BILITOT 0.1*  PROT 7.9  ALBUMIN 1.6*   No results found for this basename: LIPASE:5,AMYLASE:5 in the last 168 hours No results found for this basename: AMMONIA:5 in the last 168 hours CBC:  Lab 01/28/12 0530 01/26/12 0513 01/25/12 0800 01/24/12 0607 01/23/12 0420 01/22/12 1335  WBC 4.1 4.1 4.7 4.6 3.4* --   NEUTROABS -- -- -- -- -- 1.5*  HGB 10.0* 9.2* 9.3* 9.2* 9.1* --  HCT 29.7* 27.2* 26.7* 27.5* 27.0* --  MCV 87.4 86.9 85.9 86.2 87.4 --  PLT 138* 123* 125* 128* 120* --   Cardiac Enzymes:  Lab 01/22/12 1335  CKTOTAL --  CKMB --  CKMBINDEX --  TROPONINI <0.30   BNP: BNP (last 3 results) No results found for this basename: PROBNP:3 in the last 8760 hours CBG:  Lab 01/23/12 0551 01/22/12 1428  GLUCAP 104* 84    Signed:  FELIZ ORTIZ, ABRAHAM  [facility] 01/29/2012, 8:11 AM

## 2012-01-28 NOTE — Progress Notes (Addendum)
[facility] PROGRESS NOTE Interim History: 51 yo male hiv positive bought to [employer] [facility] ed for ams by his sister. Pt mental status normal now. He denies any fevers. Has been having nonprod cough for several days. No rashes. No n/v/d. He is complaining of mild le ankle edema that waxes/wanes but is resolved now. No significant calf swelling. No cp. No sob. Says he takes his hiv meds and is compliant but there is some mention elsewhere in chart that this is not the case. Transferred here to ED with ct head showing multiple hypodense lesions and cxr with bilateral infiltrates.   Assessment/Plan: PNA (pneumonia) (01/22/2012) - Completed a course of levaquin for CAP.  - Remains on empiric treatment bactrim for PCP.  - PCP smear ordered.  -Rest of cx data remains negative to date.  HIV (human immunodeficiency virus infection) (01/22/2012) - resume HAART. - genotyping pending follow up as an outpatient.  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 - Initial LP studies look : traumatic, 2 WBC, glucose 47 and protein 38. - Neg studies Serology: crypto CSF, VDRL CSF, HSV CSF, toxo IgM low, IgG high. - Will likely need need placement. - CIR vs SNF.  ARF/CKD/Metabolic Acidosis  - ? CKD from his HIV as his Cr seems to be hanging around 1.8-2.0, despite IV fluids. No previous Cr to compsre. - Will need OP renal follow up as an outpatient.  Hypokalemia (01/23/2012) - resolved. With replete ? Decrease intravascular vol.   Code Status: full Disposition Plan: home 1 day.   Consultants:  ID  Procedures:  LP as above.  Antibiotics: Day  6 levaquin  Day  4 oral TMP/SMX   HPI/Subjective: No complains  Objective: Filed Vitals:   01/27/12 0952 01/27/12 1400 01/27/12 2100 01/28/12 0500  BP: 130/70 123/66 115/59 134/80  Pulse:  82 62 81  Temp:  98.2 F (36.8 C) 97.7 F (36.5 C) 98.1 F (36.7 C)  TempSrc:      Resp:  17 16 16    Height:      Weight:    54.432 kg (120 lb)  SpO2:  100% 96% 97%    Intake/Output Summary (Last 24 hours) at 01/28/12 0854 Last data filed at 01/28/12 0842  Gross per 24 hour  Intake    120 ml  Output    500 ml  Net   -380 ml   Filed Weights   01/25/12 2100 01/27/12 0500 01/28/12 0500  Weight: 55.112 kg (121 lb 8 oz) 54.386 kg (119 lb 14.4 oz) 54.432 kg (120 lb)    Exam:  General: Alert, awake, oriented x3, in no acute distress.  HEENT: No bruits, no goiter.  Heart: Regular rate and rhythm, without murmurs, rubs, gallops.  Lungs: Good air movement, clear to auscultation Abdomen: Soft, nontender, nondistended, positive bowel sounds.    Data Reviewed: Basic Metabolic Panel:  Lab 01/28/12 8657 01/27/12 0450 01/26/12 0513 01/25/12 0800 01/24/12 0607  NA 131* 133* 134* 134* 135  K 4.9 4.4 4.4 4.1 4.0  CL 108 107 108 107 110  CO2 15* 16* 16* 17* 18*  GLUCOSE 87 81 81 98 97  BUN 29* 25* 23 17 23   CREATININE 2.08* 1.97* 2.00* 1.84* 1.92*  CALCIUM 8.5 8.0* 7.9* 7.8* 7.9*  MG -- -- -- -- --  PHOS -- -- -- -- --   Liver Function Tests:  Lab 01/22/12 1335  AST 50*  ALT 24  ALKPHOS 65  BILITOT 0.1*  PROT 7.9  ALBUMIN 1.6*   No results found for this basename: LIPASE:5,AMYLASE:5 in the last 168 hours No results found for this basename: AMMONIA:5 in the last 168 hours CBC:  Lab 01/28/12 0530 01/26/12 0513 01/25/12 0800 01/24/12 0607 01/23/12 0420 01/22/12 1335  WBC 4.1 4.1 4.7 4.6 3.4* --  NEUTROABS -- -- -- -- -- 1.5*  HGB 10.0* 9.2* 9.3* 9.2* 9.1* --  HCT 29.7* 27.2* 26.7* 27.5* 27.0* --  MCV 87.4 86.9 85.9 86.2 87.4 --  PLT 138* 123* 125* 128* 120* --   Cardiac Enzymes:  Lab 01/22/12 1335  CKTOTAL --  CKMB --  CKMBINDEX --  TROPONINI <0.30   BNP (last 3 results) No results found for this basename: PROBNP:3 in the last 8760 hours CBG:  Lab 01/23/12 0551 01/22/12 1428  GLUCAP 104* 84    Recent Results (from the past 240 hour(s))  CULTURE, BLOOD  (ROUTINE X 2)     Status: Normal (Preliminary result)   Collection Time   01/22/12  9:42 PM      Component Value Range Status Comment   Specimen Description BLOOD RIGHT HAND   Final    Special Requests BOTTLES DRAWN AEROBIC AND ANAEROBIC 10CC   Final    Culture  Setup Time 01/23/2012 04:17   Final    Culture     Final    Value:        BLOOD CULTURE RECEIVED NO GROWTH TO DATE CULTURE WILL BE HELD FOR 5 DAYS BEFORE ISSUING A FINAL NEGATIVE REPORT   Report Status PENDING   Incomplete   CULTURE, BLOOD (ROUTINE X 2)     Status: Normal (Preliminary result)   Collection Time   01/22/12  9:52 PM      Component Value Range Status Comment   Specimen Description BLOOD LEFT FOREARM   Final    Special Requests BOTTLES DRAWN AEROBIC AND ANAEROBIC 10CC   Final    Culture  Setup Time 01/23/2012 04:17   Final    Culture     Final    Value:        BLOOD CULTURE RECEIVED NO GROWTH TO DATE CULTURE WILL BE HELD FOR 5 DAYS BEFORE ISSUING A FINAL NEGATIVE REPORT   Report Status PENDING   Incomplete   URINE CULTURE     Status: Normal   Collection Time   01/23/12  6:02 AM      Component Value Range Status Comment   Specimen Description URINE, RANDOM   Final    Special Requests NONE   Final    Culture  Setup Time 01/23/2012 20:19   Final    Colony Count 20,OOO COLONIES/ML   Final    Culture     Final    Value: Multiple bacterial morphotypes present, none predominant. Suggest appropriate recollection if clinically indicated.   Report Status 01/24/2012 FINAL   Final   CSF CULTURE     Status: Normal (Preliminary result)   Collection Time   01/25/12  9:30 AM      Component Value Range Status Comment   Specimen Description CSF   Final    Special Requests Immunocompromised   Final    Gram Stain     Final    Value: MODERATE WBC PRESENT, PREDOMINANTLY MONONUCLEAR     NO ORGANISMS SEEN   Culture NO GROWTH 2 DAYS   Final    Report Status PENDING   Incomplete   GRAM STAIN     Status: Normal  Collection Time    01/25/12  9:30 AM      Component Value Range Status Comment   Specimen Description CSF   Final    Special Requests Immunocompromised   Final    Gram Stain     Final    Value: CYTOSPIN PREP     WBC PRESENT, PREDOMINANTLY MONONUCLEAR     NO ORGANISMS SEEN   Report Status 01/25/2012 FINAL   Final      Studies: No results found.  Scheduled Meds:   . amLODipine  5 mg Oral Daily  . azithromycin  1,200 mg Oral Weekly  . nicotine  14 mg Transdermal Q24H  . sodium chloride  3 mL Intravenous Q12H  . sulfamethoxazole-trimethoprim  2 tablet Oral Q12H   Continuous Infusions:   . sodium chloride 75 mL/hr at 01/24/12 1140     FELIZ Rosine Beat  [facility] Pager [phone]. If 8PM-8AM, please contact night-coverage at [email or web address], password [facility] 01/28/2012, 8:54 AM  LOS: 6 days

## 2012-01-29 DIAGNOSIS — J189 Pneumonia, unspecified organism: Secondary | ICD-10-CM

## 2012-01-29 DIAGNOSIS — E872 Acidosis: Secondary | ICD-10-CM

## 2012-01-29 DIAGNOSIS — B20 Human immunodeficiency virus [HIV] disease: Secondary | ICD-10-CM

## 2012-01-29 LAB — CULTURE, BLOOD (ROUTINE X 2): Culture: NO GROWTH

## 2012-01-29 LAB — JC VIRUS, PCR CSF: JC Virus PCR, CSF: NOT DETECTED

## 2012-01-29 LAB — HIV-1 GENOTYPR PLUS

## 2012-01-29 LAB — TOXOPLASMA GONDII, PCR

## 2012-01-29 MED ORDER — AZITHROMYCIN 600 MG PO TABS: 1200.0000 mg | ORAL_TABLET | ORAL | Status: DC

## 2012-01-29 MED ORDER — EMTRICITABINE-TENOFOVIR DF 200-300 MG PO TABS: 1.0000 | ORAL_TABLET | ORAL | Status: DC

## 2012-01-29 MED ORDER — SULFAMETHOXAZOLE-TMP DS 800-160 MG PO TABS: 2.0000 | ORAL_TABLET | Freq: Two times a day (BID) | ORAL | Status: DC

## 2012-01-29 MED ORDER — DOLUTEGRAVIR SODIUM 50 MG PO TABS: 50.0000 mg | ORAL_TABLET | Freq: Every day | ORAL | Status: DC

## 2012-01-29 NOTE — Progress Notes (Signed)
[facility]   Day # 8 antibiotics Day #8  TMP/SMX  Subjective: No new complaints   Antibiotics:  Anti-infectives     Start     Dose/Rate Route Frequency Ordered Stop   01/29/12 0000   Dolutegravir Sodium 50 MG TABS  Status:  Discontinued        50 mg Oral Daily 01/29/12 0803 01/29/12    01/29/12 0000   emtricitabine-tenofovir (TRUVADA) 200-300 MG per tablet  Status:  Discontinued        1 tablet Oral Every 48 hours 01/29/12 0803 01/29/12    01/29/12 0000   azithromycin (ZITHROMAX) 600 MG tablet        1,200 mg Oral Weekly 01/29/12 0807     01/29/12 0000   Dolutegravir Sodium 50 MG TABS        50 mg Oral Daily 01/29/12 0807     01/29/12 0000  sulfamethoxazole-trimethoprim (BACTRIM DS) 800-160 MG per tablet       2 tablet Oral Every 12 hours 01/29/12 0807     01/29/12 0000   emtricitabine-tenofovir (TRUVADA) 200-300 MG per tablet        1 tablet Oral Every 48 hours 01/29/12 0807     01/28/12 1100   emtricitabine-tenofovir (TRUVADA) 200-300 MG per tablet 1 tablet        1 tablet Oral Every 48 hours 01/28/12 0955     01/28/12 1100   Dolutegravir Sodium TABS 50 mg        50 mg Oral Daily 01/28/12 1017     01/28/12 1000   abacavir-lamiVUDine (EPZICOM) 600-300 MG per tablet 1 tablet  Status:  Discontinued        1 tablet Oral Daily 01/28/12 1000 01/28/12 1000   01/28/12 0000   azithromycin (ZITHROMAX) 600 MG tablet  Status:  Discontinued        1,200 mg Oral Weekly 01/28/12 0948 01/29/12    01/28/12 0000   sulfamethoxazole-trimethoprim (BACTRIM DS) 800-160 MG per tablet  Status:  Discontinued        2 tablet Oral Every 12 hours 01/28/12 0948 01/29/12    01/25/12 2200   levofloxacin (LEVAQUIN) IVPB 750 mg        750 mg 100 mL/hr over 90 Minutes Intravenous Every 48 hours 01/24/12 1256 01/25/12 2352   01/25/12 2200  sulfamethoxazole-trimethoprim (BACTRIM DS) 800-160 MG per tablet 2 tablet       2 tablet Oral Every 12 hours 01/25/12 1546     01/24/12 1415   sulfamethoxazole-trimethoprim (BACTRIM) 320 mg in dextrose 5 % 500 mL IVPB  Status:  Discontinued        320 mg 346.7 mL/hr over 90 Minutes Intravenous 3 times per day 01/24/12 1403 01/25/12 1559   01/24/12 1000   azithromycin (ZITHROMAX) tablet 1,200 mg        1,200 mg Oral Weekly 01/24/12 0813     01/22/12 2300   sulfamethoxazole-trimethoprim (BACTRIM) 340 mg in dextrose 5 % 500 mL IVPB  Status:  Discontinued        340 mg 347.5 mL/hr over 90 Minutes Intravenous 3 times per day 01/22/12 2201 01/24/12 1403   01/22/12 2300   levofloxacin (LEVAQUIN) IVPB 750 mg  Status:  Discontinued        750 mg 100 mL/hr over 90 Minutes Intravenous Every 24 hours 01/22/12 2201 01/24/12 1256   01/22/12 2200   efavirenz-emtricitabine-tenofovir (ATRIPLA) 600-200-300 MG per tablet 1 tablet  Status:  Discontinued  1 tablet Oral Daily at bedtime 01/22/12 2118 01/23/12 1320          Medications: Scheduled Meds:    . amLODipine  5 mg Oral Daily  . azithromycin  1,200 mg Oral Weekly  . Dolutegravir Sodium  50 mg Oral Daily  . emtricitabine-tenofovir  1 tablet Oral Q48H  . nicotine  14 mg Transdermal Q24H  . sodium chloride  3 mL Intravenous Q12H  . sulfamethoxazole-trimethoprim  2 tablet Oral Q12H   Continuous Infusions:    . sodium chloride 75 mL/hr at 01/24/12 1140   PRN Meds:.labetalol   Objective: Weight change: -7 lb (-3.175 kg)  Intake/Output Summary (Last 24 hours) at 01/29/12 1114 Last data filed at 01/29/12 0800  Gross per 24 hour  Intake    360 ml  Output    400 ml  Net    -40 ml   Blood pressure 130/74, pulse 80, temperature 97.9 F (36.6 C), temperature source Oral, resp. rate 18, height 5\' 11"  (1.803 m), weight 113 lb (51.256 kg), SpO2 99.00%. Temp:  [97.8 F (36.6 C)-98.3 F (36.8 C)] 97.9 F (36.6 C) (12/24 4098) Pulse Rate:  [80-95] 80  (12/24 0613) Resp:  [16-18] 18  (12/23 2035) BP: (108-134)/(68-76) 130/74 mmHg (12/24 0613) SpO2:  [97  %-99 %] 99 % (12/24 1191) Weight:  [113 lb (51.256 kg)] 113 lb (51.256 kg) (12/24 4782)  Physical Exam: General: Alert and awake, oriented x3, not in any acute distress.  HEENT: anicteric sclera, pupils reactive to light and accommodation, EOMI, oropharynx some coating on the tongue CVS regular rate, normal r, no murmur rubs or gallops  Chest: Few crackles at the bases but otherwise clear to auscultation bilaterally, Abdomen: soft nontender, nondistended, normal bowel sounds,  Extremities: no clubbing or edema noted bilaterally  Skin: no rashes  Neuro: Patient has 4 or 5 strength on the right hip flexion as well as flexion extension around the knee. Dorsiflexion is also diminished at 3-4/5 flexion 4-5 in the right strength on the left side is 5 out of 5 throughout the left lower tremor the. Upper x-ray strength is symmetrical. Cranial nerves II through XII are grossly intact and cerebellar function is intact.  Lab Results:  [facility] 01/28/12 0530  WBC 4.1  HGB 10.0*  HCT 29.7*  PLT 138*    BMET  Basename 01/28/12 0530 01/27/12 0450  NA 131* 133*  K 4.9 4.4  CL 108 107  CO2 15* 16*  GLUCOSE 87 81  BUN 29* 25*  CREATININE 2.08* 1.97*  CALCIUM 8.5 8.0*    Micro Results: Recent Results (from the past 240 hour(s))  CULTURE, BLOOD (ROUTINE X 2)     Status: Normal   Collection Time   01/22/12  9:42 PM      Component Value Range Status Comment   Specimen Description BLOOD RIGHT HAND   Final    Special Requests BOTTLES DRAWN AEROBIC AND ANAEROBIC 10CC   Final    Culture  Setup Time 01/23/2012 04:17   Final    Culture NO GROWTH 5 DAYS   Final    Report Status 01/29/2012 FINAL   Final   CULTURE, BLOOD (ROUTINE X 2)     Status: Normal   Collection Time   01/22/12  9:52 PM      Component Value Range Status Comment   Specimen Description BLOOD LEFT FOREARM   Final    Special Requests BOTTLES DRAWN AEROBIC AND ANAEROBIC 10CC   Final  Culture  Setup Time 01/23/2012 04:17   Final      Culture NO GROWTH 5 DAYS   Final    Report Status 01/29/2012 FINAL   Final   URINE CULTURE     Status: Normal   Collection Time   01/23/12  6:02 AM      Component Value Range Status Comment   Specimen Description URINE, RANDOM   Final    Special Requests NONE   Final    Culture  Setup Time 01/23/2012 20:19   Final    Colony Count 20,OOO COLONIES/ML   Final    Culture     Final    Value: Multiple bacterial morphotypes present, none predominant. Suggest appropriate recollection if clinically indicated.   Report Status 01/24/2012 FINAL   Final   CSF CULTURE     Status: Normal   Collection Time   01/25/12  9:30 AM      Component Value Range Status Comment   Specimen Description CSF   Final    Special Requests Immunocompromised   Final    Gram Stain     Final    Value: MODERATE WBC PRESENT, PREDOMINANTLY MONONUCLEAR     NO ORGANISMS SEEN   Culture NO GROWTH 3 DAYS   Final    Report Status 01/28/2012 FINAL   Final   GRAM STAIN     Status: Normal   Collection Time   01/25/12  9:30 AM      Component Value Range Status Comment   Specimen Description CSF   Final    Special Requests Immunocompromised   Final    Gram Stain     Final    Value: CYTOSPIN PREP     WBC PRESENT, PREDOMINANTLY MONONUCLEAR     NO ORGANISMS SEEN   Report Status 01/25/2012 FINAL   Final     Studies/Results: No results found.    Assessment/Plan: Jay Flores is a 51 y.o. 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    #1 brain lesions: This patient is certainly at risk for multiple opportunistic infections including cryptococcal meningitis though his coccal antigen is negative in serum. He is certain he also at risk for toxoplasmosis, CMV infection, p.m. L.  due to JC virus as well as primary CNS lymphoma.MRI sounds less like classic Toxo or primary CNS lymphoma and lesions could even be related to ischemia. LP with many RBC few WBC. I do not see CSF protein or glucose. His CSF crypto ag, HSV VDRL negative.  Serum Toxo IgG positive (IgM fwiw --not much negative),   CSF for  for toxoplasma PCR pending , VZV PCR pending  CMV was not run, I will have this added   --he is now apparently being dc to his sisters house, and is neurologically stable   #2 ? PCP vs CAP: LDH normal. Ch --continue bactrim and complete 21 day course then one tablet daily for prophylaxis he may have a bit of an RTA given bicarb now to 15   #3 HIV/AIDS: CD4 80 --HIV genotype pending as is HLA [id number]  -- has been started on daily Tivicay and QOD truvada --he will need close followup as an outpt in [facility], He is at risk for IRIS --I worry  GREATLY about his ability to adhere to meds  I spent greater than 45 minutes with the patient including greater than 50% of time in face to face counsel of the patient and in coordination of their care.  His contact number  Is [phone]     #4 OI: contnue azithromycin weekly for prophlaxis.      LOS: 7 days   Acey Lav 01/29/2012, 11:14 AM

## 2012-01-31 LAB — MISCELLANEOUS TEST: Miscellaneous Test Results: NOT DETECTED

## 2012-02-04 LAB — CMV (CYTOMEGALOVIRUS) DNA ULTRAQUANT, PCR: CMV DNA Quant: <200 copies/mL (ref <200)

## 2012-02-08 ENCOUNTER — Inpatient Hospital Stay: Payer: Medicaid Other | Admitting: Internal Medicine

## 2012-02-08 ENCOUNTER — Telehealth: Payer: Self-pay | Admitting: *Deleted

## 2012-02-08 LAB — HLA B*5701: HLA B 5701: NEGATIVE

## 2012-02-08 NOTE — Telephone Encounter (Signed)
Printed Demographic information, viral load and cd4 count for referral for [employer].  Hand delivered referral to [place].

## 2012-02-19 DIAGNOSIS — R531 Weakness: Secondary | ICD-10-CM | POA: Insufficient documentation

## 2012-02-19 DIAGNOSIS — I10 Essential (primary) hypertension: Secondary | ICD-10-CM | POA: Insufficient documentation

## 2013-10-30 ENCOUNTER — Ambulatory Visit (INDEPENDENT_AMBULATORY_CARE_PROVIDER_SITE_OTHER): Payer: Medicaid Other | Admitting: Podiatry

## 2013-10-30 ENCOUNTER — Encounter: Payer: Self-pay | Admitting: Podiatry

## 2013-10-30 VITALS — BP 122/77 | HR 76 | Resp 16 | Ht 70.0 in | Wt 155.0 lb

## 2013-10-30 DIAGNOSIS — B351 Tinea unguium: Secondary | ICD-10-CM

## 2013-10-30 DIAGNOSIS — L600 Ingrowing nail: Secondary | ICD-10-CM

## 2013-10-30 NOTE — Progress Notes (Signed)
   Subjective:    Patient ID: Jay Flores, male    DOB: 10-03-1960, 53 y.o.   MRN: [id number]  HPI Comments: i need my toenails trimmed. My toenails do not hurt. They are getting worse. No one takes care of my toenails.     Review of Systems  All other systems reviewed and are negative.      Objective:   Physical Exam        Assessment & Plan:

## 2013-10-30 NOTE — Progress Notes (Signed)
Subjective:     Patient ID: Jay Flores, male   DOB: [birth year], 53 y.o.   MRN: [id number]  HPI patient presents stating I have severely thickened nailbeds that I have not cut for years on both feet   Review of Systems  All other systems reviewed and are negative.      Objective:   Physical Exam  Nursing note and vitals reviewed. Constitutional: He is oriented to person, place, and time.  Cardiovascular: Intact distal pulses.   Musculoskeletal: Normal range of motion.  Neurological: He is oriented to person, place, and time.  Skin: Skin is warm and dry.   neurovascular status was found to be intact but diminished with dryness of the skin noted and diminished range of motion subtalar midtarsal joint with severe thickness of nailbeds 1 through 5 left over right with inability to wear shoe gear secondary to length of nails and pain     Assessment:     Severe mycotic nail infection 1-5 of both feet    Plan:     H&P and conditions discussed with him and caregiver. Debridement of nailbeds 1-5 both feet with no iatrogenic bleeding noted and will be repeated in 6 months earlier if necessary

## 2014-04-30 ENCOUNTER — Other Ambulatory Visit: Payer: Medicaid Other

## 2014-05-11 ENCOUNTER — Ambulatory Visit (INDEPENDENT_AMBULATORY_CARE_PROVIDER_SITE_OTHER): Payer: Medicaid Other | Admitting: Podiatry

## 2014-05-11 DIAGNOSIS — M79676 Pain in unspecified toe(s): Secondary | ICD-10-CM

## 2014-05-11 DIAGNOSIS — B351 Tinea unguium: Secondary | ICD-10-CM

## 2014-05-11 NOTE — Progress Notes (Signed)
Subjective: 54 y.o.-year-old male returns the office today for painful, elongated, thickened toenails which he is unable to cut himself. Denies any redness or drainage around the nails. Denies any acute changes since last appointment and no new complaints today. Denies any systemic complaints such as fevers, chills, nausea, vomiting.   Objective: AAO 3, NAD DP/PT pulses palpable, CRT less than 3 seconds Protective sensation intact with Simms Weinstein monofilament, Achilles tendon reflex intact.  Nails hypertrophic, dystrophic, elongated, brittle, discolored 10. There is tenderness overlying the nails 1-5 bilaterally. There is no surrounding erythema or drainage along the nail sites. No open lesions or pre-ulcerative lesions are identified. No other areas of tenderness bilateral lower extremities. No overlying edema, erythema, increased warmth. No pain with calf compression, swelling, warmth, erythema.  Assessment: Patient presents with symptomatic onychomycosis  Plan: -Treatment options including alternatives, risks, complications were discussed -Nails sharply debrided 10 without complication/bleeding. -Discussed daily foot inspection. If there are any changes, to call the office immediately.  -Follow-up in 3 months or sooner if any problems are to arise. In the meantime, encouraged to call the office with any questions, concerns, changes symptoms.

## 2014-08-17 ENCOUNTER — Ambulatory Visit: Payer: Medicaid Other | Admitting: Podiatry

## 2014-08-19 ENCOUNTER — Ambulatory Visit: Payer: Medicaid Other | Admitting: Podiatry

## 2014-09-02 ENCOUNTER — Ambulatory Visit (INDEPENDENT_AMBULATORY_CARE_PROVIDER_SITE_OTHER): Payer: Medicaid Other | Admitting: Podiatry

## 2014-09-02 DIAGNOSIS — M79676 Pain in unspecified toe(s): Secondary | ICD-10-CM | POA: Diagnosis not present

## 2014-09-02 DIAGNOSIS — B351 Tinea unguium: Secondary | ICD-10-CM

## 2014-09-02 NOTE — Progress Notes (Signed)
Subjective: 54 y.o.-year-old male returns the office today for painful, elongated, thickened toenails which he is unable to cut himself. Denies any redness or drainage around the nails. Denies any acute changes since last appointment and no new complaints today. Denies any systemic complaints such as fevers, chills, nausea, vomiting.   Objective: AAO 3, NAD DP/PT pulses palpable, CRT less than 3 seconds Protective sensation intact with Simms Weinstein monofilament, Achilles tendon reflex intact.  Nails hypertrophic, dystrophic, elongated, brittle, discolored 10. There is tenderness overlying the nails 1-5 bilaterally. There is no surrounding erythema or drainage along the nail sites. No open lesions or pre-ulcerative lesions are identified. No other areas of tenderness bilateral lower extremities. No overlying edema, erythema, increased warmth. No pain with calf compression, swelling, warmth, erythema.  Assessment: Patient presents with symptomatic onychomycosis  Plan: -Treatment options including alternatives, risks, complications were discussed -Nails sharply debrided 10 without complication/bleeding. -Discussed daily foot inspection. If there are any changes, to call the office immediately.  -Follow-up in 3 months or sooner if any problems are to arise. In the meantime, encouraged to call the office with any questions, concerns, changes symptoms.   Ovid Curd, DPM

## 2014-12-09 ENCOUNTER — Ambulatory Visit: Payer: Medicaid Other

## 2015-01-10 ENCOUNTER — Other Ambulatory Visit: Payer: Self-pay | Admitting: Infectious Diseases

## 2015-01-10 DIAGNOSIS — N183 Chronic kidney disease, stage 3 unspecified: Secondary | ICD-10-CM

## 2015-01-10 DIAGNOSIS — B182 Chronic viral hepatitis C: Secondary | ICD-10-CM

## 2015-01-10 DIAGNOSIS — B20 Human immunodeficiency virus [HIV] disease: Secondary | ICD-10-CM

## 2015-01-10 DIAGNOSIS — I10 Essential (primary) hypertension: Secondary | ICD-10-CM

## 2015-01-17 ENCOUNTER — Ambulatory Visit: Payer: Medicaid Other

## 2015-04-18 ENCOUNTER — Ambulatory Visit
Admission: RE | Admit: 2015-04-18 | Discharge: 2015-04-18 | Disposition: A | Discharge status: Home / Self Care | Payer: Medicaid Other | Source: Ambulatory Visit | Attending: Infectious Diseases | Admitting: Infectious Diseases

## 2015-04-18 DIAGNOSIS — Z9889 Other specified postprocedural states: Secondary | ICD-10-CM | POA: Insufficient documentation

## 2015-04-18 DIAGNOSIS — K8020 Calculus of gallbladder without cholecystitis without obstruction: Secondary | ICD-10-CM | POA: Diagnosis not present

## 2015-04-18 DIAGNOSIS — B20 Human immunodeficiency virus [HIV] disease: Secondary | ICD-10-CM

## 2015-04-18 DIAGNOSIS — I10 Essential (primary) hypertension: Secondary | ICD-10-CM | POA: Diagnosis present

## 2015-04-18 DIAGNOSIS — K760 Fatty (change of) liver, not elsewhere classified: Secondary | ICD-10-CM | POA: Insufficient documentation

## 2015-04-18 DIAGNOSIS — Z21 Asymptomatic human immunodeficiency virus [HIV] infection status: Secondary | ICD-10-CM | POA: Insufficient documentation

## 2015-04-18 DIAGNOSIS — N183 Chronic kidney disease, stage 3 unspecified: Secondary | ICD-10-CM

## 2015-04-18 DIAGNOSIS — B182 Chronic viral hepatitis C: Secondary | ICD-10-CM

## 2015-05-03 ENCOUNTER — Encounter: Payer: Self-pay | Admitting: Podiatry

## 2015-05-03 ENCOUNTER — Ambulatory Visit (INDEPENDENT_AMBULATORY_CARE_PROVIDER_SITE_OTHER): Payer: Medicaid Other | Admitting: Podiatry

## 2015-05-03 DIAGNOSIS — B351 Tinea unguium: Secondary | ICD-10-CM

## 2015-05-03 DIAGNOSIS — M79676 Pain in unspecified toe(s): Secondary | ICD-10-CM

## 2015-05-03 NOTE — Progress Notes (Signed)
Patient ID: Dianne DunBernard A Maharaj, male   DOB: 06/14/1960, 55 y.o.   MRN: [id number]  Subjective: 55 y.o.-year-old male returns the office today for painful, elongated, thickened toenails which he is unable to cut himself. Denies any redness or drainage around the nails. Denies any acute changes since last appointment and no new complaints today. Denies any systemic complaints such as fevers, chills, nausea, vomiting.   Objective: AAO 3, NAD DP/PT pulses palpable, CRT less than 3 seconds Protective sensation intact with Simms Weinstein monofilament, Achilles tendon reflex intact.  Nails are very hypertrophic, dystrophic, elongated, brittle, discolored 10. The nails are VERY LONG. There is tenderness overlying the nails 1-5 bilaterally. There is no surrounding erythema or drainage along the nail sites. No open lesions or pre-ulcerative lesions are identified. No other areas of tenderness bilateral lower extremities. No overlying edema, erythema, increased warmth. No pain with calf compression, swelling, warmth, erythema.  Assessment: Patient presents with symptomatic onychomycosis  Plan: -Treatment options including alternatives, risks, complications were discussed -Nails sharply debrided 10 without complication/bleeding. -Discussed daily foot inspection. If there are any changes, to call the office immediately.  -Follow-up in 3 months or sooner if any problems are to arise. In the meantime, encouraged to call the office with any questions, concerns, changes symptoms.   Ovid CurdMatthew Wagoner, DPM

## 2015-08-04 ENCOUNTER — Encounter: Payer: Self-pay | Admitting: Podiatry

## 2015-08-04 ENCOUNTER — Ambulatory Visit: Payer: Medicaid Other | Admitting: Podiatry

## 2015-12-19 DIAGNOSIS — M62838 Other muscle spasm: Secondary | ICD-10-CM | POA: Insufficient documentation

## 2015-12-19 DIAGNOSIS — R292 Abnormal reflex: Secondary | ICD-10-CM | POA: Insufficient documentation

## 2015-12-19 DIAGNOSIS — R262 Difficulty in walking, not elsewhere classified: Secondary | ICD-10-CM | POA: Insufficient documentation

## 2015-12-20 ENCOUNTER — Other Ambulatory Visit: Payer: Self-pay | Admitting: Infectious Diseases

## 2015-12-20 DIAGNOSIS — R9089 Other abnormal findings on diagnostic imaging of central nervous system: Secondary | ICD-10-CM

## 2015-12-20 DIAGNOSIS — R531 Weakness: Secondary | ICD-10-CM

## 2015-12-20 DIAGNOSIS — M62838 Other muscle spasm: Secondary | ICD-10-CM

## 2015-12-20 DIAGNOSIS — G939 Disorder of brain, unspecified: Secondary | ICD-10-CM

## 2016-01-03 ENCOUNTER — Ambulatory Visit
Admission: RE | Admit: 2016-01-03 | Discharge: 2016-01-03 | Disposition: A | Discharge status: Home / Self Care | Payer: Medicaid Other | Source: Ambulatory Visit | Attending: Infectious Diseases | Admitting: Infectious Diseases

## 2016-01-03 DIAGNOSIS — G939 Disorder of brain, unspecified: Secondary | ICD-10-CM

## 2016-01-03 DIAGNOSIS — R531 Weakness: Secondary | ICD-10-CM

## 2016-01-03 DIAGNOSIS — R9089 Other abnormal findings on diagnostic imaging of central nervous system: Secondary | ICD-10-CM | POA: Insufficient documentation

## 2016-01-03 DIAGNOSIS — M62838 Other muscle spasm: Secondary | ICD-10-CM | POA: Diagnosis not present

## 2016-01-03 DIAGNOSIS — G319 Degenerative disease of nervous system, unspecified: Secondary | ICD-10-CM | POA: Insufficient documentation

## 2016-01-03 MED ORDER — GADOBENATE DIMEGLUMINE 529 MG/ML IV SOLN
15.0000 mL | Freq: Once | INTRAVENOUS | Status: AC | PRN
  Administered 2016-01-03: 14 mL via INTRAVENOUS

## 2016-01-18 DIAGNOSIS — R29898 Other symptoms and signs involving the musculoskeletal system: Secondary | ICD-10-CM | POA: Insufficient documentation

## 2016-04-16 ENCOUNTER — Encounter: Payer: Self-pay | Admitting: Podiatry

## 2016-04-16 ENCOUNTER — Ambulatory Visit (INDEPENDENT_AMBULATORY_CARE_PROVIDER_SITE_OTHER): Payer: Medicaid Other | Admitting: Podiatry

## 2016-04-16 DIAGNOSIS — B351 Tinea unguium: Secondary | ICD-10-CM

## 2016-04-16 DIAGNOSIS — M79676 Pain in unspecified toe(s): Secondary | ICD-10-CM

## 2016-04-16 NOTE — Progress Notes (Signed)
Complaint:  Visit Type: Patient returns to my office for continued preventative foot care services. Complaint: Patient states" my nails have grown long and thick and become painful to walk and wear shoes" . The patient presents for preventative foot care services. No changes to ROS  Podiatric Exam: Vascular: dorsalis pedis and posterior tibial pulses are palpable bilateral. Capillary return is immediate. Temperature gradient is WNL. Skin turgor WNL  Sensorium: Normal Semmes Weinstein monofilament test. Normal tactile sensation bilaterally. Nail Exam: Pt has thick disfigured discolored nails with subungual debris noted bilateral entire nail hallux through fifth toenails Ulcer Exam: There is no evidence of ulcer or pre-ulcerative changes or infection. Orthopedic Exam: Muscle tone and strength are WNL. No limitations in general ROM. No crepitus or effusions noted. Foot type and digits show no abnormalities. Bony prominences are unremarkable. Skin: No Porokeratosis. No infection or ulcers  Diagnosis:  Onychomycosis, , Pain in right toe, pain in left toes  Treatment & Plan Procedures and Treatment: Consent by patient was obtained for treatment procedures. The patient understood the discussion of treatment and procedures well. All questions were answered thoroughly reviewed. Debridement of mycotic and hypertrophic toenails, 1 through 5 bilateral and clearing of subungual debris. No ulceration, no infection noted.  Return Visit-Office Procedure: Patient instructed to return to the office for a follow up visit 3 months   for continued evaluation and treatment.    Jacqueline Spofford DPM 

## 2016-07-23 ENCOUNTER — Ambulatory Visit (INDEPENDENT_AMBULATORY_CARE_PROVIDER_SITE_OTHER): Payer: Medicaid Other | Admitting: Podiatry

## 2016-07-23 DIAGNOSIS — M79676 Pain in unspecified toe(s): Secondary | ICD-10-CM

## 2016-07-23 DIAGNOSIS — B351 Tinea unguium: Secondary | ICD-10-CM | POA: Diagnosis not present

## 2016-07-23 DIAGNOSIS — Q828 Other specified congenital malformations of skin: Secondary | ICD-10-CM

## 2016-07-23 NOTE — Progress Notes (Signed)
Complaint:  Visit Type: Patient returns to my office for continued preventative foot care services. Complaint: Patient states" my nails have grown long and thick and become painful to walk and wear shoes"  The patient presents for preventative foot care services. No changes to ROS.  He says his calluses are very painful when he walks.  Podiatric Exam: Vascular: dorsalis pedis and posterior tibial pulses are palpable bilateral. Capillary return is immediate. Temperature gradient is WNL. Skin turgor WNL  Sensorium: Normal Semmes Weinstein monofilament test. Normal tactile sensation bilaterally. Nail Exam: Pt has thick disfigured discolored nails with subungual debris noted bilateral entire nail hallux through fifth toenails Ulcer Exam: There is no evidence of ulcer or pre-ulcerative changes or infection. Orthopedic Exam: Muscle tone and strength are WNL. No limitations in general ROM. No crepitus or effusions noted. Foot type and digits show no abnormalities. Bony prominences are unremarkable. 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 The patient understood the discussion of treatment and procedures well. All questions were answered thoroughly reviewed. Debridement of mycotic and hypertrophic toenails, 1 through 5 bilateral and clearing of subungual debris. No ulceration, no infection noted.  Return Visit-Office Procedure: Patient instructed to return to the office for a follow up visit 10 weeks for continued evaluation and treatment.    Helane GuntherGregory Tyaisha Cullom DPM

## 2016-09-07 DIAGNOSIS — B20 Human immunodeficiency virus [HIV] disease: Secondary | ICD-10-CM | POA: Insufficient documentation

## 2016-09-27 ENCOUNTER — Ambulatory Visit: Payer: Medicaid Other | Admitting: Podiatry

## 2016-10-11 ENCOUNTER — Ambulatory Visit (INDEPENDENT_AMBULATORY_CARE_PROVIDER_SITE_OTHER): Payer: Medicaid Other | Admitting: Podiatry

## 2016-10-11 DIAGNOSIS — M79675 Pain in left toe(s): Secondary | ICD-10-CM

## 2016-10-11 DIAGNOSIS — Q828 Other specified congenital malformations of skin: Secondary | ICD-10-CM

## 2016-10-11 DIAGNOSIS — M79674 Pain in right toe(s): Secondary | ICD-10-CM

## 2016-10-11 DIAGNOSIS — B351 Tinea unguium: Secondary | ICD-10-CM | POA: Diagnosis not present

## 2016-10-11 NOTE — Progress Notes (Signed)
Complaint:  Visit Type: Patient returns to my office for continued preventative foot care services. Complaint: Patient states" my nails have grown long and thick and become painful to walk and wear shoes"  The patient presents for preventative foot care services. No changes to ROS.  He says his calluses are very painful when he walks.  Podiatric Exam: Vascular: dorsalis pedis and posterior tibial pulses are palpable bilateral. Capillary return is immediate. Temperature gradient is WNL. Skin turgor WNL  Sensorium: Normal Semmes Weinstein monofilament test. Normal tactile sensation bilaterally. Nail Exam: Pt has thick disfigured discolored nails with subungual debris noted bilateral entire nail hallux through fifth toenails.  Nail self avulsed fourth toe left foot..  No infection noted. Ulcer Exam: There is no evidence of ulcer or pre-ulcerative changes or infection. Orthopedic Exam: Muscle tone and strength are WNL. No limitations in general ROM. No crepitus or effusions noted. Foot type and digits show no abnormalities. Bony prominences are unremarkable. 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 The patient understood the discussion of treatment and procedures well. All questions were answered thoroughly reviewed. Debridement of mycotic and hypertrophic toenails, 1 through 5 bilateral and clearing of subungual debris. No ulceration, no infection noted.  Return Visit-Office Procedure: Patient instructed to return to the office for a follow up visit 10 weeks for continued evaluation and treatment.    Helane GuntherGregory Chardae Mulkern DPM

## 2017-01-10 ENCOUNTER — Encounter: Payer: Self-pay | Admitting: Podiatry

## 2017-01-10 ENCOUNTER — Ambulatory Visit: Payer: Medicaid Other | Admitting: Podiatry

## 2017-01-10 DIAGNOSIS — M79674 Pain in right toe(s): Secondary | ICD-10-CM | POA: Diagnosis not present

## 2017-01-10 DIAGNOSIS — B351 Tinea unguium: Secondary | ICD-10-CM | POA: Diagnosis not present

## 2017-01-10 DIAGNOSIS — M79675 Pain in left toe(s): Secondary | ICD-10-CM

## 2017-01-10 NOTE — Progress Notes (Signed)
Complaint:  Visit Type: Patient returns to my office for continued preventative foot care services. Complaint: Patient states" my nails have grown long and thick and become painful to walk and wear shoes"  The patient presents for preventative foot care services. No changes to ROS.  He says his calluses are very painful when he walks.  Podiatric Exam: Vascular: dorsalis pedis and posterior tibial pulses are palpable bilateral. Capillary return is immediate. Temperature gradient is WNL. Skin turgor WNL  Sensorium: Normal Semmes Weinstein monofilament test. Normal tactile sensation bilaterally. Nail Exam: Pt has thick disfigured discolored nails with subungual debris noted bilateral entire nail hallux through fifth toenails.  Nail self avulsed fourth toe left foot..  No infection noted. Ulcer Exam: There is no evidence of ulcer or pre-ulcerative changes or infection. Orthopedic Exam: Muscle tone and strength are WNL. No limitations in general ROM. No crepitus or effusions noted. Foot type and digits show no abnormalities. Bony prominences are unremarkable. Skin:   Asymptomatic porokeratosis sub 5  B/L No infection or ulcers  Diagnosis:  Onychomycosis, , Pain in right toe, pain in left toes,    Treatment & Plan Procedures and Treatment: Consent by patient was obtained for treatment procedures. The patient understood the discussion of treatment and procedures well. All questions were answered thoroughly reviewed. Debridement of mycotic and hypertrophic toenails, 1 through 5 bilateral and clearing of subungual debris. No ulceration, no infection noted. DSD applied fourth toe left foot. Return Visit-Office Procedure: Patient instructed to return to the office for a follow up visit 10 weeks for continued evaluation and treatment.    Helane GuntherGregory Jaman Aro DPM

## 2017-03-21 ENCOUNTER — Ambulatory Visit (INDEPENDENT_AMBULATORY_CARE_PROVIDER_SITE_OTHER): Payer: Medicaid Other | Admitting: Podiatry

## 2017-03-21 ENCOUNTER — Encounter: Payer: Self-pay | Admitting: Podiatry

## 2017-03-21 DIAGNOSIS — M79675 Pain in left toe(s): Secondary | ICD-10-CM

## 2017-03-21 DIAGNOSIS — Q828 Other specified congenital malformations of skin: Secondary | ICD-10-CM

## 2017-03-21 DIAGNOSIS — M79674 Pain in right toe(s): Secondary | ICD-10-CM

## 2017-03-21 DIAGNOSIS — B351 Tinea unguium: Secondary | ICD-10-CM | POA: Diagnosis not present

## 2017-03-21 NOTE — Progress Notes (Signed)
Complaint:  Visit Type: Patient returns to my office for continued preventative foot care services. Complaint: Patient states" my nails have grown long and thick and become painful to walk and wear shoes"  The patient presents for preventative foot care services. No changes to ROS.  He says his calluses are very painful when he walks.  Podiatric Exam: Vascular: dorsalis pedis and posterior tibial pulses are palpable bilateral. Capillary return is immediate. Temperature gradient is WNL. Skin turgor WNL  Sensorium: Normal Semmes Weinstein monofilament test. Normal tactile sensation bilaterally. Nail Exam: Pt has thick disfigured discolored nails with subungual debris noted bilateral entire nail hallux through fifth toenails.  Nail self avulsed fourth toe left foot..  No infection noted. Ulcer Exam: There is no evidence of ulcer or pre-ulcerative changes or infection. Orthopedic Exam: Muscle tone and strength are WNL. No limitations in general ROM. No crepitus or effusions noted. Foot type and digits show no abnormalities. Bony prominences are unremarkable. Skin:   Asymptomatic porokeratosis sub 5  B/L No infection or ulcers  Diagnosis:  Onychomycosis, , Pain in right toe, pain in left toes,    Treatment & Plan Procedures and Treatment: Consent by patient was obtained for treatment procedures. The patient understood the discussion of treatment and procedures well. All questions were answered thoroughly reviewed. Debridement of mycotic and hypertrophic toenails, 1 through 5 bilateral and clearing of subungual debris. No ulceration, no infection noted.  Return Visit-Office Procedure: Patient instructed to return to the office for a follow up visit 10 weeks for continued evaluation and treatment.    Helane GuntherGregory Horatio Bertz DPM

## 2017-03-29 ENCOUNTER — Other Ambulatory Visit: Payer: Self-pay

## 2017-03-29 ENCOUNTER — Encounter: Payer: Self-pay | Admitting: Emergency Medicine

## 2017-03-29 ENCOUNTER — Inpatient Hospital Stay
Admission: EM | Admit: 2017-03-29 | Discharge: 2017-04-01 | DRG: 637 | Disposition: A | Discharge status: Home / Self Care | Payer: Medicaid Other | Attending: Internal Medicine | Admitting: Internal Medicine

## 2017-03-29 DIAGNOSIS — E871 Hypo-osmolality and hyponatremia: Secondary | ICD-10-CM | POA: Diagnosis present

## 2017-03-29 DIAGNOSIS — E1100 Type 2 diabetes mellitus with hyperosmolarity without nonketotic hyperglycemic-hyperosmolar coma (NKHHC): Secondary | ICD-10-CM | POA: Diagnosis present

## 2017-03-29 DIAGNOSIS — I129 Hypertensive chronic kidney disease with stage 1 through stage 4 chronic kidney disease, or unspecified chronic kidney disease: Secondary | ICD-10-CM | POA: Diagnosis present

## 2017-03-29 DIAGNOSIS — R809 Proteinuria, unspecified: Secondary | ICD-10-CM | POA: Diagnosis present

## 2017-03-29 DIAGNOSIS — N170 Acute kidney failure with tubular necrosis: Secondary | ICD-10-CM | POA: Diagnosis present

## 2017-03-29 DIAGNOSIS — E860 Dehydration: Secondary | ICD-10-CM | POA: Diagnosis present

## 2017-03-29 DIAGNOSIS — N183 Chronic kidney disease, stage 3 (moderate): Secondary | ICD-10-CM | POA: Diagnosis present

## 2017-03-29 DIAGNOSIS — R748 Abnormal levels of other serum enzymes: Secondary | ICD-10-CM | POA: Diagnosis present

## 2017-03-29 DIAGNOSIS — B20 Human immunodeficiency virus [HIV] disease: Secondary | ICD-10-CM | POA: Diagnosis present

## 2017-03-29 DIAGNOSIS — M6282 Rhabdomyolysis: Secondary | ICD-10-CM | POA: Diagnosis present

## 2017-03-29 DIAGNOSIS — E872 Acidosis, unspecified: Secondary | ICD-10-CM

## 2017-03-29 DIAGNOSIS — E1140 Type 2 diabetes mellitus with diabetic neuropathy, unspecified: Secondary | ICD-10-CM | POA: Diagnosis present

## 2017-03-29 DIAGNOSIS — E1110 Type 2 diabetes mellitus with ketoacidosis without coma: Secondary | ICD-10-CM | POA: Diagnosis present

## 2017-03-29 DIAGNOSIS — F17210 Nicotine dependence, cigarettes, uncomplicated: Secondary | ICD-10-CM | POA: Diagnosis present

## 2017-03-29 DIAGNOSIS — E876 Hypokalemia: Secondary | ICD-10-CM | POA: Diagnosis present

## 2017-03-29 DIAGNOSIS — B1920 Unspecified viral hepatitis C without hepatic coma: Secondary | ICD-10-CM | POA: Diagnosis present

## 2017-03-29 DIAGNOSIS — Z23 Encounter for immunization: Secondary | ICD-10-CM | POA: Diagnosis not present

## 2017-03-29 DIAGNOSIS — Z993 Dependence on wheelchair: Secondary | ICD-10-CM | POA: Diagnosis not present

## 2017-03-29 DIAGNOSIS — E875 Hyperkalemia: Secondary | ICD-10-CM | POA: Diagnosis present

## 2017-03-29 LAB — CBC WITH DIFFERENTIAL/PLATELET
Basophils Absolute: 0 10*3/uL (ref 0–0.1)
Basophils Relative: 0 %
EOS ABS: 0 10*3/uL (ref 0–0.7)
EOS PCT: 0 %
HCT: 45.8 % (ref 40.0–52.0)
Hemoglobin: 14.9 g/dL (ref 13.0–18.0)
LYMPHS ABS: 0.7 10*3/uL — AB (ref 1.0–3.6)
Lymphocytes Relative: 8 %
MCH: 33.4 pg (ref 26.0–34.0)
MCHC: 32.6 g/dL (ref 32.0–36.0)
MCV: 102.5 fL — ABNORMAL HIGH (ref 80.0–100.0)
Monocytes Absolute: 0.3 10*3/uL (ref 0.2–1.0)
Monocytes Relative: 3 %
Neutro Abs: 7.6 10*3/uL — ABNORMAL HIGH (ref 1.4–6.5)
Neutrophils Relative %: 89 %
Platelets: 180 10*3/uL (ref 150–440)
RBC: 4.47 MIL/uL (ref 4.40–5.90)
RDW: 13.2 % (ref 11.5–14.5)
WBC: 8.6 10*3/uL (ref 3.8–10.6)

## 2017-03-29 LAB — BASIC METABOLIC PANEL
ANION GAP: 16 — AB (ref 5–15)
Anion gap: 16 — ABNORMAL HIGH (ref 5–15)
BUN: 55 mg/dL — AB (ref 6–20)
BUN: 48 mg/dL — AB (ref 6–20)
CALCIUM: 8.4 mg/dL — AB (ref 8.9–10.3)
CALCIUM: 8.3 mg/dL — AB (ref 8.9–10.3)
CO2: 11 mmol/L — ABNORMAL LOW (ref 22–32)
CO2: 14 mmol/L — ABNORMAL LOW (ref 22–32)
CREATININE: 3.06 mg/dL — AB (ref 0.61–1.24)
Chloride: 104 mmol/L (ref 101–111)
Chloride: 111 mmol/L (ref 101–111)
Creatinine, Ser: 2.58 mg/dL — ABNORMAL HIGH (ref 0.61–1.24)
GFR calc Af Amer: 25 mL/min — ABNORMAL LOW (ref >60)
GFR calc Af Amer: 31 mL/min — ABNORMAL LOW (ref >60)
GFR calc non Af Amer: 26 mL/min — ABNORMAL LOW (ref >60)
GFR, EST NON AFRICAN AMERICAN: 21 mL/min — AB (ref >60)
GLUCOSE: 718 mg/dL — AB (ref 65–99)
GLUCOSE: 336 mg/dL — AB (ref 65–99)
Potassium: 4.5 mmol/L (ref 3.5–5.1)
Potassium: 3.2 mmol/L — ABNORMAL LOW (ref 3.5–5.1)
Sodium: 131 mmol/L — ABNORMAL LOW (ref 135–145)
Sodium: 141 mmol/L (ref 135–145)

## 2017-03-29 LAB — COMPREHENSIVE METABOLIC PANEL
ALK PHOS: 156 U/L — AB (ref 38–126)
ALT: 57 U/L (ref 17–63)
AST: 43 U/L — ABNORMAL HIGH (ref 15–41)
Albumin: 2.9 g/dL — ABNORMAL LOW (ref 3.5–5.0)
Anion gap: 20 — ABNORMAL HIGH (ref 5–15)
BUN: 56 mg/dL — ABNORMAL HIGH (ref 6–20)
CALCIUM: 8.4 mg/dL — AB (ref 8.9–10.3)
CO2: 8 mmol/L — AB (ref 22–32)
CREATININE: 3.24 mg/dL — AB (ref 0.61–1.24)
Chloride: 96 mmol/L — ABNORMAL LOW (ref 101–111)
GFR calc non Af Amer: 20 mL/min — ABNORMAL LOW (ref >60)
GFR, EST AFRICAN AMERICAN: 23 mL/min — AB (ref >60)
GLUCOSE: 1112 mg/dL — AB (ref 65–99)
Potassium: 5.6 mmol/L — ABNORMAL HIGH (ref 3.5–5.1)
SODIUM: 124 mmol/L — AB (ref 135–145)
Total Bilirubin: 1.3 mg/dL — ABNORMAL HIGH (ref 0.3–1.2)
Total Protein: 7.9 g/dL (ref 6.5–8.1)

## 2017-03-29 LAB — CBC
HCT: 45.3 % (ref 40.0–52.0)
Hemoglobin: 15.1 g/dL (ref 13.0–18.0)
MCH: 32.8 pg (ref 26.0–34.0)
MCHC: 33.3 g/dL (ref 32.0–36.0)
MCV: 98.6 fL (ref 80.0–100.0)
Platelets: 173 10*3/uL (ref 150–440)
RBC: 4.6 MIL/uL (ref 4.40–5.90)
RDW: 12.6 % (ref 11.5–14.5)
WBC: 9.5 10*3/uL (ref 3.8–10.6)

## 2017-03-29 LAB — URINALYSIS, COMPLETE (UACMP) WITH MICROSCOPIC
Bilirubin Urine: NEGATIVE
Glucose, UA: >=500 mg/dL — AB
Ketones, ur: 20 mg/dL — AB
Leukocytes, UA: NEGATIVE
NITRITE: NEGATIVE
Protein, ur: 100 mg/dL — AB
SPECIFIC GRAVITY, URINE: 1.022 (ref 1.005–1.030)
pH: 5 (ref 5.0–8.0)

## 2017-03-29 LAB — GLUCOSE, CAPILLARY
GLUCOSE-CAPILLARY: 188 mg/dL — AB (ref 65–99)
GLUCOSE-CAPILLARY: 189 mg/dL — AB (ref 65–99)
GLUCOSE-CAPILLARY: 247 mg/dL — AB (ref 65–99)
GLUCOSE-CAPILLARY: 355 mg/dL — AB (ref 65–99)
Glucose-Capillary: >600 mg/dL (ref 65–99)
Glucose-Capillary: >600 mg/dL (ref 65–99)
Glucose-Capillary: 525 mg/dL (ref 65–99)
Glucose-Capillary: 330 mg/dL — ABNORMAL HIGH (ref 65–99)

## 2017-03-29 LAB — BLOOD GAS, VENOUS
Acid-base deficit: 17.8 mmol/L — ABNORMAL HIGH (ref 0.0–2.0)
Bicarbonate: 8.4 mmol/L — ABNORMAL LOW (ref 20.0–28.0)
O2 Saturation: 79.2 %
PH VEN: 7.19 — AB (ref 7.250–7.430)
Patient temperature: 37
pCO2, Ven: 22 mmHg — ABNORMAL LOW (ref 44.0–60.0)
pO2, Ven: 55 mmHg — ABNORMAL HIGH (ref 32.0–45.0)

## 2017-03-29 LAB — INFLUENZA PANEL BY PCR (TYPE A & B)
INFLAPCR: NEGATIVE
Influenza B By PCR: NEGATIVE

## 2017-03-29 LAB — TROPONIN I: Troponin I: 0.03 ng/mL (ref <0.03)

## 2017-03-29 LAB — CK: Total CK: 2226 U/L — ABNORMAL HIGH (ref 49–397)

## 2017-03-29 LAB — MRSA PCR SCREENING: MRSA BY PCR: NEGATIVE

## 2017-03-29 MED ORDER — SODIUM CHLORIDE 0.9 % IV SOLN
INTRAVENOUS | Status: DC
  Administered 2017-03-29: 5.4 [IU]/h via INTRAVENOUS
  Filled 2017-03-29: qty 1

## 2017-03-29 MED ORDER — INSULIN ASPART 100 UNIT/ML ~~LOC~~ SOLN
5.0000 [IU] | Freq: Once | SUBCUTANEOUS | Status: AC
  Administered 2017-03-29: 5 [IU] via INTRAVENOUS
  Filled 2017-03-29: qty 1

## 2017-03-29 MED ORDER — SODIUM CHLORIDE 0.9 % IV SOLN
INTRAVENOUS | Status: DC
  Administered 2017-03-29: 18:00:00 via INTRAVENOUS

## 2017-03-29 MED ORDER — SODIUM BICARBONATE 8.4 % IV SOLN
50.0000 meq | Freq: Once | INTRAVENOUS | Status: AC
  Administered 2017-03-29: 50 meq via INTRAVENOUS
  Filled 2017-03-29: qty 50

## 2017-03-29 MED ORDER — SODIUM CHLORIDE 0.9 % IV BOLUS (SEPSIS)
1000.0000 mL | Freq: Once | INTRAVENOUS | Status: AC
  Administered 2017-03-29: 1000 mL via INTRAVENOUS

## 2017-03-29 MED ORDER — SODIUM CHLORIDE 0.9 % IV SOLN
INTRAVENOUS | Status: DC
  Administered 2017-03-29: 10.8 [IU]/h via INTRAVENOUS
  Administered 2017-03-30: 4.5 [IU]/h via INTRAVENOUS
  Filled 2017-03-29 (×4): qty 1

## 2017-03-29 MED ORDER — HEPARIN SODIUM (PORCINE) 5000 UNIT/ML IJ SOLN
5000.0000 [IU] | Freq: Three times a day (TID) | INTRAMUSCULAR | Status: DC
  Administered 2017-03-29 – 2017-04-01 (×9): 5000 [IU] via SUBCUTANEOUS
  Filled 2017-03-29 (×9): qty 1

## 2017-03-29 MED ORDER — POTASSIUM CHLORIDE 10 MEQ/100ML IV SOLN
10.0000 meq | INTRAVENOUS | Status: AC
  Filled 2017-03-29 (×2): qty 100

## 2017-03-29 MED ORDER — DEXTROSE-NACL 5-0.45 % IV SOLN
INTRAVENOUS | Status: DC
  Administered 2017-03-29 – 2017-03-30 (×2): via INTRAVENOUS

## 2017-03-29 MED ORDER — EMTRICITABINE-TENOFOVIR AF 200-25 MG PO TABS
1.0000 | ORAL_TABLET | Freq: Every day | ORAL | Status: DC
  Administered 2017-03-29 – 2017-04-01 (×4): 1 via ORAL
  Filled 2017-03-29 (×4): qty 1

## 2017-03-29 MED ORDER — AZITHROMYCIN 500 MG PO TABS
1250.0000 mg | ORAL_TABLET | ORAL | Status: DC
  Administered 2017-03-29: 1250 mg via ORAL
  Filled 2017-03-29: qty 2.5

## 2017-03-29 MED ORDER — SODIUM CHLORIDE 0.9 % IV SOLN: INTRAVENOUS | Status: AC

## 2017-03-29 MED ORDER — AMLODIPINE BESYLATE 5 MG PO TABS
5.0000 mg | ORAL_TABLET | Freq: Every day | ORAL | Status: DC
  Administered 2017-03-30 – 2017-03-31 (×2): 5 mg via ORAL
  Filled 2017-03-29 (×2): qty 1

## 2017-03-29 MED ORDER — HEPARIN SODIUM (PORCINE) 5000 UNIT/ML IJ SOLN
5000.0000 [IU] | Freq: Three times a day (TID) | INTRAMUSCULAR | Status: DC
  Administered 2017-03-29: 5000 [IU] via SUBCUTANEOUS
  Filled 2017-03-29: qty 1

## 2017-03-29 NOTE — ED Notes (Signed)
Date and time results received: 03/29/17 1440 (use smartphrase ".now" to insert current time)  Test: Troponin I, Glucose Critical Value: 0.03, 1112 respectively  Name of Provider Notified: Dr. Darnelle CatalanMalinda  Orders Received? Or Actions Taken?: Orders Received - See Orders for details

## 2017-03-29 NOTE — H&P (Signed)
[facility] - [place] at [facility]   PATIENT NAME: Jay Flores    MR#:  [id number]  DATE OF BIRTH:  Jun 22, 1960  DATE OF ADMISSION:  03/29/2017  PRIMARY CARE PHYSICIAN: Mick Sell, MD   REQUESTING/REFERRING PHYSICIAN:   CHIEF COMPLAINT:   Chief Complaint  Patient presents with  . Weakness    HISTORY OF PRESENT ILLNESS: Arling Cerone  is a 57 y.o. male with a known history of HIV disease, hepatitis C presented to the emergency room with generalized weakness.  Patient feels dry and dehydrated and he has pain in the lower extremities especially in the calves.  No complaints of any chest pain, shortness of breath.  He was evaluated in the emergency room blood sugars elevated, anion gap is also elevated.  Patient has not been taking his medication for diabetes.  Started on IV insulin drip in the emergency room and CK level was also elevated.  Patient started on IV fluids for rhabdomyolysis.  PAST MEDICAL HISTORY:   Past Medical History:  Diagnosis Date  . Acute renal failure (HCC)   . Altered mental status 01/20/2012  . HIV (human immunodeficiency virus infection) (HCC)     PAST SURGICAL HISTORY:  Past Surgical History:  Procedure Laterality Date  . NO PAST SURGERIES      SOCIAL HISTORY:  Social History   Tobacco Use  . Smoking status: Current Every Day Smoker    Packs/day: 1.00    Years: 34.00    Pack years: 34.00    Types: Cigarettes  . Smokeless tobacco: Never Used  Substance Use Topics  . Alcohol use: No    FAMILY HISTORY:  Family History  Problem Relation Age of Onset  . Diabetes Mellitus II Mother     DRUG ALLERGIES: No Known Allergies  REVIEW OF SYSTEMS:   CONSTITUTIONAL: No fever, has weakness.  EYES: No blurred or double vision.  EARS, NOSE, AND THROAT: No tinnitus or ear pain.  RESPIRATORY: No cough, shortness of breath, wheezing or hemoptysis.  CARDIOVASCULAR: No chest pain, orthopnea, edema.   GASTROINTESTINAL: No nausea, vomiting, diarrhea or abdominal pain.  GENITOURINARY: No dysuria, hematuria.  ENDOCRINE: No polyuria, nocturia,  HEMATOLOGY: No anemia, easy bruising or bleeding SKIN: No rash or lesion. MUSCULOSKELETAL: No joint pain or arthritis.   Has pain in legs NEUROLOGIC: No tingling, numbness, weakness.  PSYCHIATRY: No anxiety or depression.   MEDICATIONS AT HOME:  Prior to Admission medications   Medication Sig Start Date End Date Taking? Authorizing Provider  amLODipine (NORVASC) 5 MG tablet Take 1 tablet (5 mg total) by mouth daily. Patient not taking: Reported on 03/29/2017 01/28/12   Marinda Elk, MD  azithromycin (ZITHROMAX) 600 MG tablet Take 2 tablets (1,200 mg total) by mouth once a week. Patient not taking: Reported on 03/29/2017 01/29/12   Marinda Elk, MD  emtricitabine-tenofovir (TRUVADA) 200-300 MG per tablet Take 1 tablet by mouth every other day. Patient not taking: Reported on 03/29/2017 01/29/12   Marinda Elk, MD  sulfamethoxazole-trimethoprim (BACTRIM DS) 800-160 MG per tablet Take 2 tablets by mouth every 12 (twelve) hours. Patient not taking: Reported on 03/29/2017 01/29/12   Marinda Elk, MD      PHYSICAL EXAMINATION:   VITAL SIGNS: Blood pressure (!) 160/92, pulse 89, temperature 97.6 F (36.4 C), temperature source Oral, resp. rate 18, height 5\' 9"  (1.753 m), weight 68 kg (150 lb), SpO2 96 %.  GENERAL:  57 y.o.-year-old patient lying in the bed with  no acute distress.  EYES: Pupils equal, round, reactive to light and accommodation. No scleral icterus. Extraocular muscles intact.  HEENT: Head atraumatic, normocephalic. Oropharynx dry and nasopharynx clear.  NECK:  Supple, no jugular venous distention. No thyroid enlargement, no tenderness.  LUNGS: Normal breath sounds bilaterally, no wheezing, rales,rhonchi or crepitation. No use of accessory muscles of respiration.  CARDIOVASCULAR: S1, S2 normal. No murmurs,  rubs, or gallops.  ABDOMEN: Soft, nontender, nondistended. Bowel sounds present. No organomegaly or mass.  EXTREMITIES: No pedal edema, cyanosis, or clubbing.  NEUROLOGIC: Cranial nerves II through XII are intact. Muscle strength 5/5 in all extremities. Sensation intact. Gait not checked.  PSYCHIATRIC: The patient is alert and oriented x 3.  SKIN: No obvious rash, lesion, or ulcer.   LABORATORY PANEL:   CBC Recent Labs  Lab 03/29/17 1315  WBC 8.6  HGB 14.9  HCT 45.8  PLT 180  MCV 102.5*  MCH 33.4  MCHC 32.6  RDW 13.2  LYMPHSABS 0.7*  MONOABS 0.3  EOSABS 0.0  BASOSABS 0.0   ------------------------------------------------------------------------------------------------------------------  Chemistries  Recent Labs  Lab 03/29/17 1315  NA 124*  K 5.6*  CL 96*  CO2 8*  GLUCOSE 1,112*  BUN 56*  CREATININE 3.24*  CALCIUM 8.4*  AST 43*  ALT 57  ALKPHOS 156*  BILITOT 1.3*   ------------------------------------------------------------------------------------------------------------------ estimated creatinine clearance is 24.8 mL/min (A) (by C-G formula based on SCr of 3.24 mg/dL (H)). ------------------------------------------------------------------------------------------------------------------ No results for input(s): TSH, T4TOTAL, T3FREE, THYROIDAB in the last 72 hours.  Invalid input(s): FREET3   Coagulation profile No results for input(s): INR, PROTIME in the last 168 hours. ------------------------------------------------------------------------------------------------------------------- No results for input(s): DDIMER in the last 72 hours. -------------------------------------------------------------------------------------------------------------------  Cardiac Enzymes Recent Labs  Lab 03/29/17 1315  TROPONINI 0.03*   ------------------------------------------------------------------------------------------------------------------ Invalid input(s):  POCBNP  ---------------------------------------------------------------------------------------------------------------  Urinalysis    Component Value Date/Time   COLORURINE STRAW (A) 03/29/2017 1315   APPEARANCEUR CLEAR (A) 03/29/2017 1315   LABSPEC 1.022 03/29/2017 1315   PHURINE 5.0 03/29/2017 1315   GLUCOSEU >=500 (A) 03/29/2017 1315   HGBUR LARGE (A) 03/29/2017 1315   BILIRUBINUR NEGATIVE 03/29/2017 1315   KETONESUR 20 (A) 03/29/2017 1315   PROTEINUR 100 (A) 03/29/2017 1315   UROBILINOGEN 1.0 01/23/2012 0602   NITRITE NEGATIVE 03/29/2017 1315   LEUKOCYTESUR NEGATIVE 03/29/2017 1315     RADIOLOGY: No results found.  EKG: Orders placed or performed during the hospital encounter of 03/29/17  . ED EKG  . ED EKG    IMPRESSION AND PLAN: 57 year old male patient with history of HIV disease, hepatitis C, diabetes mellitus, renal insufficiency presented to the emergency room with elevated blood sugar and pain in the lower extremities.  Admitting diagnosis 1 diabetic ketoacidosis. 2.  Rhabdomyolysis 3.  Dehydration 4.  Hyperkalemia 5.  Chronic kidney disease 6.  HIV disease 7.  Hepatitis C Treatment plan Admit patient to ICU IV insulin drip for blood sugar control Aggressive IV fluid hydration Monitor electrolytes Monitor renal function Intensivist consult  All the records are reviewed and case discussed with ED provider. Management plans discussed with the patient, family and they are in agreement.  CODE STATUS:FULL CODE Code Status History    Date Active Date Inactive Code Status Order ID Comments User Context   01/22/2012 21:18 01/29/2012 16:08 Full Code [id number]  Tarry Kosavid, Rachal, MD Inpatient       TOTAL CRITICAL CARE TIME TAKING CARE OF THIS PATIENT: 57 minutes.    Ihor AustinPavan Chizaram Latino M.D on 03/29/2017 at 3:28 PM  Between 7am to 6pm - Pager - [phone]  After 6pm go to [email or web address] - password [email or web address] [facility]  [facility]  Office   [phone]  CC: Primary care physician; Mick Sell, MD

## 2017-03-29 NOTE — Consult Note (Signed)
[facility] [place] [facility] Consultation    ASSESSMENT/PLAN   DKA.  Placed on DKA protocol, insulin infusion, fluid resuscitation  HIV disease.  Patient is noted to be on Truvada but not actively taking.  Unknown infectious disease follow-up.  Unknown CD4 count and viral load will check  Hyponatremia.  Hyponatremia most likely secondary to elevated blood sugar, will improve as glucose is brought down  Renal insufficiency.  Renal failure multifactorial to include dehydration, rhabdomyolysis.  Being fluid rehydrated, will follow closely if does not improve will obtain renal ultrasound  Acid-base derangement.  Acid-base derangement anion gap of 20, delta gap of 8, calculated starting bicarb of 16 consistent with initial non-anion gap metabolic acidosis and subsequent anion gap metabolic acidosis, arterial blood gas not obtained for complete interpretation  Rhabdomyolysis.  CK is elevated at 2326.  Will place on volume resuscitation to include bicarbonate  Positive troponin.  Elevated troponin may be secondary to renal failure.  No ischemic changes noted on EKG, will obtain echocardiography   INTAKE / OUTPUT:  Intake/Output Summary (Last 24 hours) at 03/29/2017 1717 Last data filed at 03/29/2017 1542 Gross per 24 hour  Intake 1000 ml  Output -  Net 1000 ml    Name: Jay Flores MRN: [id number] DOB: 06/08/1960    ADMISSION DATE:  03/29/2017 CONSULTATION DATE: 03/29/2017  REFERRING MD :  Dr. Tobi Bastos  CHIEF COMPLAINT:     HISTORY OF PRESENT ILLNESS: Jay Flores is a 57 year old gentleman with a past medical history remarkable for renal insufficiency altered mental status, HIV disease, hepatitis C, presented to the emergency room with generalized weakness.  Patient feels dry and dehydrated. He was evaluated in the emergency room blood sugars elevated at 1112, elevated anion gap of 20, sodium is 124, BUN/creatinine is 56/3.24 with a CK of 2226. Patient was given saline  resuscitation, started on insulin infusion and transferred to the intensive care unit.  PAST MEDICAL HISTORY :  Past Medical History:  Diagnosis Date  . Acute renal failure (HCC)   . Altered mental status 01/20/2012  . HIV (human immunodeficiency virus infection) (HCC)    Past Surgical History:  Procedure Laterality Date  . NO PAST SURGERIES     Prior to Admission medications   Medication Sig Start Date End Date Taking? Authorizing Provider  amLODipine (NORVASC) 5 MG tablet Take 1 tablet (5 mg total) by mouth daily. Patient not taking: Reported on 03/29/2017 01/28/12   Marinda Elk, MD  azithromycin (ZITHROMAX) 600 MG tablet Take 2 tablets (1,200 mg total) by mouth once a week. Patient not taking: Reported on 03/29/2017 01/29/12   Marinda Elk, MD  emtricitabine-tenofovir (TRUVADA) 200-300 MG per tablet Take 1 tablet by mouth every other day. Patient not taking: Reported on 03/29/2017 01/29/12   Marinda Elk, MD  sulfamethoxazole-trimethoprim (BACTRIM DS) 800-160 MG per tablet Take 2 tablets by mouth every 12 (twelve) hours. Patient not taking: Reported on 03/29/2017 01/29/12   Marinda Elk, MD   No Known Allergies  FAMILY HISTORY:  Family History  Problem Relation Age of Onset  . Diabetes Mellitus II Mother    SOCIAL HISTORY:  reports that he has been smoking cigarettes.  He has a 34.00 pack-year smoking history. he has never used smokeless tobacco. He reports that he does not drink alcohol or use drugs.  REVIEW OF SYSTEMS:   Constitutional: Feels well. Cardiovascular: No chest pain.  Pulmonary: Denies dyspnea.   The remainder of systems were reviewed and were  found to be negative other than what is documented in the HPI.    VITAL SIGNS: Temp:  [97.6 F (36.4 C)-97.8 F (36.6 C)] 97.8 F (36.6 C) (02/22 1705) Pulse Rate:  [89-96] 89 (02/22 1705) Resp:  [16-22] 22 (02/22 1705) BP: (127-160)/(91-94) 127/91 (02/22 1705) SpO2:  [96 %-100 %]  100 % (02/22 1705) Weight:  [64.4 kg (141 lb 15.6 oz)-68 kg (150 lb)] 64.4 kg (141 lb 15.6 oz) (02/22 1700) HEMODYNAMICS:  INTAKE / OUTPUT:  Intake/Output Summary (Last 24 hours) at 03/29/2017 1717 Last data filed at 03/29/2017 1542 Gross per 24 hour  Intake 1000 ml  Output -  Net 1000 ml    Physical Examination:   VS: BP (!) 127/91 (BP Location: Right Arm)   Pulse 89   Temp 97.8 F (36.6 C) (Oral)   Resp (!) 22   Ht 5\' 9"  (1.753 m)   Wt 64.4 kg (141 lb 15.6 oz)   SpO2 100%   BMI 20.97 kg/m   General Appearance: No distress  Neuro:without focal findings, mental status, speech normal,. HEENT: Trachea is midline, no thyromegaly appreciated, no accessory muscle utilization noted Pulmonary: normal breath sounds., diaphragmatic excursion normal. CardiovascularNormal S1,S2.  No m/r/g.    Abdomen: Benign, Soft, non-tender, No masses, hepatosplenomegaly, No lymphadenopathy Skin:   warm, no rashes, no ecchymosis  Extremities: normal, no cyanosis, clubbing, no edema, warm with normal capillary refill.    LABS: Reviewed   LABORATORY PANEL:   CBC Recent Labs  Lab 03/29/17 1315  WBC 8.6  HGB 14.9  HCT 45.8  PLT 180    Chemistries  Recent Labs  Lab 03/29/17 1315  NA 124*  K 5.6*  CL 96*  CO2 8*  GLUCOSE 1,112*  BUN 56*  CREATININE 3.24*  CALCIUM 8.4*  AST 43*  ALT 57  ALKPHOS 156*  BILITOT 1.3*    Recent Labs  Lab 03/29/17 1325 03/29/17 1546 03/29/17 1659  GLUCAP >600* >600* >600*   No results for input(s): PHART, PCO2ART, PO2ART in the last 168 hours. Recent Labs  Lab 03/29/17 1315  AST 43*  ALT 57  ALKPHOS 156*  BILITOT 1.3*  ALBUMIN 2.9*    Cardiac Enzymes Recent Labs  Lab 03/29/17 1315  TROPONINI 0.03*    RADIOLOGY:  No results found.    Tora KindredJohn Meylin Stenzel, DO  03/29/2017, 5:17 PM

## 2017-03-29 NOTE — ED Provider Notes (Signed)
[facility] Emergency Department Provider Note   ____________________________________________   First MD Initiated Contact with Patient 03/29/17 1317     (approximate)  I have reviewed the triage vital signs and the nursing notes.   HISTORY  Chief Complaint Weakness    HPI Jay Flores is a 57 y.o. male Who complains of generalized weakness for 2 days. He denies any fever coughing like that. He does have a history of HIV hepatitis C hypertension. EMS report CBG was reading high. Patient does not have a history of diabetes that he knows about. He is taking all his medications including his HIV meds in his cell count is good. he just feels weak and tired. He says he is urinating a lot and his mouth feels dry.   Past Medical History:  Diagnosis Date  . Acute renal failure (HCC)   . Altered mental status 01/20/2012  . HIV (human immunodeficiency virus infection) [facility](HCC)     Patient Active Problem List   Diagnosis Date Noted  . Metabolic acidosis 01/27/2012  . Hypokalemia 01/23/2012  . PNA (pneumonia) 01/22/2012  . HIV (human immunodeficiency virus infection) (HCC) 01/22/2012  . Brain lesion 01/22/2012  . Acute renal failure (HCC) 01/22/2012  . Anemia 01/22/2012  . Altered mental status 01/22/2012    Past Surgical History:  Procedure Laterality Date  . NO PAST SURGERIES      Prior to Admission medications   Medication Sig Start Date End Date Taking? Authorizing Provider  abacavir-dolutegravir-lamiVUDine (TRIUMEQ) 600-50-300 MG tablet Take by mouth. 02/03/16   [provider]  amLODipine (NORVASC) 5 MG tablet Take 1 tablet (5 mg total) by mouth daily. 01/28/12   Marinda ElkFeliz Ortiz, Abraham, MD  azithromycin (ZITHROMAX) 600 MG tablet Take 2 tablets (1,200 mg total) by mouth once a week. 01/29/12   Marinda ElkFeliz Ortiz, Abraham, MD  buPROPion (ZYBAN) 150 MG 12 hr tablet Take by mouth.    [provider]  emtricitabine-tenofovir (TRUVADA)  200-300 MG per tablet Take 1 tablet by mouth every other day. 01/29/12   Marinda ElkFeliz Ortiz, Abraham, MD  sulfamethoxazole-trimethoprim (BACTRIM DS) 800-160 MG per tablet Take 2 tablets by mouth every 12 (twelve) hours. 01/29/12   Marinda ElkFeliz Ortiz, Abraham, MD    Allergies Patient has no known allergies.  History reviewed. No pertinent family history.  Social History Social History   Tobacco Use  . Smoking status: Current Every Day Smoker    Packs/day: 1.00    Years: 34.00    Pack years: 34.00    Types: Cigarettes  . Smokeless tobacco: Never Used  Substance Use Topics  . Alcohol use: No  . Drug use: No    Comment: 01/22/2012 "last used cocaine ~ 2012"    Review of Systems  Constitutional: No fever/chills Eyes: No visual changes. ENT: No sore throat. Cardiovascular: Denies chest pain. Respiratory: Denies shortness of breath. Gastrointestinal: No abdominal pain.  No nausea, no vomiting.  No diarrhea.  No constipation. Genitourinary: Negative for dysuria. Musculoskeletal: Negative for back pain. Skin: Negative for rash. Neurological: Negative for headaches, focal weakness   ____________________________________________   PHYSICAL EXAM:  VITAL SIGNS: ED Triage Vitals  Enc Vitals Group     BP 03/29/17 1311 (!) 160/92     Pulse Rate 03/29/17 1311 89     Resp 03/29/17 1311 18     Temp 03/29/17 1311 97.6 F (36.4 C)     Temp Source 03/29/17 1311 Oral     SpO2 03/29/17 1311 96 %  Weight 03/29/17 1312 150 lb (68 kg)     Height 03/29/17 1312 5\' 9"  (1.753 m)     Head Circumference --      Peak Flow --      Pain Score --      Pain Loc --      Pain Edu? --      Excl. in GC? --     Constitutional: Alert and oriented. Well appearing and in no acute distress. Eyes: Conjunctivae are normal.  Head: Atraumatic. Nose: No congestion/rhinnorhea. Mouth/Throat: Mucous membranes are moist.  Oropharynx non-erythematous. Neck: No stridor.   Cardiovascular: Normal rate, regular rhythm.  Grossly normal heart sounds.  Good peripheral circulation. Respiratory: Normal respiratory effort.  No retractions. Lungs CTAB. Gastrointestinal: Soft and nontender. No distention. No abdominal bruits. No CVA tenderness. Musculoskeletal: No lower extremity tenderness nor edema.  No joint effusions. Neurologic:  Normal speech and language. No gross focal neurologic deficits are appreciated. No gait instability. Skin:  Skin is warm, dry and intact. No rash noted. Psychiatric: Mood and affect are normal. Speech and behavior are normal.  ____________________________________________   LABS (all labs ordered are listed, but only abnormal results are displayed)  Labs Reviewed  URINALYSIS, COMPLETE (UACMP) WITH MICROSCOPIC - Abnormal; Notable for the following components:      Result Value   Color, Urine STRAW (*)    APPearance CLEAR (*)    Glucose, UA >=500 (*)    Hgb urine dipstick LARGE (*)    Ketones, ur 20 (*)    Protein, ur 100 (*)    Bacteria, UA RARE (*)    Squamous Epithelial / LPF 0-5 (*)    All other components within normal limits  COMPREHENSIVE METABOLIC PANEL - Abnormal; Notable for the following components:   Sodium 124 (*)    Potassium 5.6 (*)    Chloride 96 (*)    CO2 8 (*)    Glucose, Bld 1,112 (*)    BUN 56 (*)    Creatinine, Ser 3.24 (*)    Calcium 8.4 (*)    Albumin 2.9 (*)    AST 43 (*)    Alkaline Phosphatase 156 (*)    Total Bilirubin 1.3 (*)    GFR calc non Af Amer 20 (*)    GFR calc Af Amer 23 (*)    Anion gap 20 (*)    All other components within normal limits  TROPONIN I - Abnormal; Notable for the following components:   Troponin I 0.03 (*)    All other components within normal limits  CBC WITH DIFFERENTIAL/PLATELET - Abnormal; Notable for the following components:   MCV 102.5 (*)    Neutro Abs 7.6 (*)    Lymphs Abs 0.7 (*)    All other components within normal limits  BLOOD GAS, VENOUS - Abnormal; Notable for the following components:   pH, Ven  7.19 (*)    pCO2, Ven 22 (*)    pO2, Ven 55.0 (*)    Bicarbonate 8.4 (*)    Acid-base deficit 17.8 (*)    All other components within normal limits  CK - Abnormal; Notable for the following components:   Total CK 2,226 (*)    All other components within normal limits  GLUCOSE, CAPILLARY - Abnormal; Notable for the following components:   Glucose-Capillary >600 (*)    All other components within normal limits  INFLUENZA PANEL BY PCR (TYPE A & B)  CBG MONITORING, ED  CBG MONITORING, ED   ____________________________________________  EKG EKG read and interpreted by me shows normal sinus rhythm rate of 89 normal axis no acute ST-T wave changes  ____________________________________________  RADIOLOGY  ED MD interpretation:    Official radiology report(s): No results found.  ____________________________________________   PROCEDURES  Procedure(s) performed:  Procedures  Critical Care performed:  critical care time involving monitoring labs checking on the patient and discussing things with the patient one half hour ____________________________________________   INITIAL IMPRESSION / ASSESSMENT AND PLAN / ED COURSE  patient has CK greater than 2000. He has a a glucose greater than 1000 he is acidotic hyponatremic from the diabetes we will give him fluids him on an insulin drip and put him in the hospital as he is seriously ill.         ____________________________________________   FINAL CLINICAL IMPRESSION(S) / ED DIAGNOSES  Final diagnoses:  Hyperosmolar non-ketotic state in patient with type 2 diabetes mellitus (HCC)  Acidosis     ED Discharge Orders    None       Note:  This document was prepared using Dragon voice recognition software and may include unintentional dictation errors.    Arnaldo Natal, MD 03/29/17 1452

## 2017-03-29 NOTE — ED Triage Notes (Signed)
[facility] c/o generalized weakness x2 days, worsening today. EMS report CBG meter reading "high." Pt denies PMH diabetes. Does have hx, HIV, Hep C, HTN. EMS also report fruity odor to breath.

## 2017-03-30 LAB — BASIC METABOLIC PANEL
ANION GAP: 7 (ref 5–15)
Anion gap: 10 (ref 5–15)
Anion gap: 9 (ref 5–15)
Anion gap: 8 (ref 5–15)
BUN: 42 mg/dL — ABNORMAL HIGH (ref 6–20)
BUN: 29 mg/dL — ABNORMAL HIGH (ref 6–20)
BUN: 39 mg/dL — AB (ref 6–20)
BUN: 32 mg/dL — AB (ref 6–20)
CALCIUM: 8.2 mg/dL — AB (ref 8.9–10.3)
CALCIUM: 8.1 mg/dL — AB (ref 8.9–10.3)
CO2: 18 mmol/L — ABNORMAL LOW (ref 22–32)
CO2: 17 mmol/L — AB (ref 22–32)
CO2: 19 mmol/L — AB (ref 22–32)
CO2: 16 mmol/L — AB (ref 22–32)
CREATININE: 2.08 mg/dL — AB (ref 0.61–1.24)
Calcium: 8.3 mg/dL — ABNORMAL LOW (ref 8.9–10.3)
Calcium: 8.3 mg/dL — ABNORMAL LOW (ref 8.9–10.3)
Chloride: 115 mmol/L — ABNORMAL HIGH (ref 101–111)
Chloride: 114 mmol/L — ABNORMAL HIGH (ref 101–111)
Chloride: 115 mmol/L — ABNORMAL HIGH (ref 101–111)
Chloride: 114 mmol/L — ABNORMAL HIGH (ref 101–111)
Creatinine, Ser: 2.25 mg/dL — ABNORMAL HIGH (ref 0.61–1.24)
Creatinine, Ser: 2.01 mg/dL — ABNORMAL HIGH (ref 0.61–1.24)
Creatinine, Ser: 1.7 mg/dL — ABNORMAL HIGH (ref 0.61–1.24)
GFR calc Af Amer: 40 mL/min — ABNORMAL LOW (ref >60)
GFR calc Af Amer: 36 mL/min — ABNORMAL LOW (ref >60)
GFR calc Af Amer: 41 mL/min — ABNORMAL LOW (ref >60)
GFR calc non Af Amer: 44 mL/min — ABNORMAL LOW (ref >60)
GFR, EST AFRICAN AMERICAN: 51 mL/min — AB (ref >60)
GFR, EST NON AFRICAN AMERICAN: 31 mL/min — AB (ref >60)
GFR, EST NON AFRICAN AMERICAN: 34 mL/min — AB (ref >60)
GFR, EST NON AFRICAN AMERICAN: 36 mL/min — AB (ref >60)
GLUCOSE: 177 mg/dL — AB (ref 65–99)
GLUCOSE: 184 mg/dL — AB (ref 65–99)
GLUCOSE: 171 mg/dL — AB (ref 65–99)
Glucose, Bld: 171 mg/dL — ABNORMAL HIGH (ref 65–99)
Potassium: 2.8 mmol/L — ABNORMAL LOW (ref 3.5–5.1)
Potassium: 3.5 mmol/L (ref 3.5–5.1)
Potassium: 3.4 mmol/L — ABNORMAL LOW (ref 3.5–5.1)
Potassium: 3.1 mmol/L — ABNORMAL LOW (ref 3.5–5.1)
SODIUM: 139 mmol/L (ref 135–145)
Sodium: 142 mmol/L (ref 135–145)
Sodium: 142 mmol/L (ref 135–145)
Sodium: 139 mmol/L (ref 135–145)

## 2017-03-30 LAB — GLUCOSE, CAPILLARY
GLUCOSE-CAPILLARY: 136 mg/dL — AB (ref 65–99)
GLUCOSE-CAPILLARY: 164 mg/dL — AB (ref 65–99)
GLUCOSE-CAPILLARY: 152 mg/dL — AB (ref 65–99)
GLUCOSE-CAPILLARY: 288 mg/dL — AB (ref 65–99)
GLUCOSE-CAPILLARY: 161 mg/dL — AB (ref 65–99)
GLUCOSE-CAPILLARY: 164 mg/dL — AB (ref 65–99)
GLUCOSE-CAPILLARY: 148 mg/dL — AB (ref 65–99)
GLUCOSE-CAPILLARY: 130 mg/dL — AB (ref 65–99)
GLUCOSE-CAPILLARY: 202 mg/dL — AB (ref 65–99)
Glucose-Capillary: 147 mg/dL — ABNORMAL HIGH (ref 65–99)
Glucose-Capillary: 148 mg/dL — ABNORMAL HIGH (ref 65–99)
Glucose-Capillary: 163 mg/dL — ABNORMAL HIGH (ref 65–99)
Glucose-Capillary: 166 mg/dL — ABNORMAL HIGH (ref 65–99)
Glucose-Capillary: 172 mg/dL — ABNORMAL HIGH (ref 65–99)
Glucose-Capillary: 173 mg/dL — ABNORMAL HIGH (ref 65–99)
Glucose-Capillary: 179 mg/dL — ABNORMAL HIGH (ref 65–99)
Glucose-Capillary: 194 mg/dL — ABNORMAL HIGH (ref 65–99)
Glucose-Capillary: 232 mg/dL — ABNORMAL HIGH (ref 65–99)

## 2017-03-30 LAB — CBC
HEMATOCRIT: 38.7 % — AB (ref 40.0–52.0)
Hemoglobin: 13.3 g/dL (ref 13.0–18.0)
MCH: 32.9 pg (ref 26.0–34.0)
MCHC: 34.3 g/dL (ref 32.0–36.0)
MCV: 95.9 fL (ref 80.0–100.0)
Platelets: 149 10*3/uL — ABNORMAL LOW (ref 150–440)
RBC: 4.04 MIL/uL — ABNORMAL LOW (ref 4.40–5.90)
RDW: 12.5 % (ref 11.5–14.5)
WBC: 6.1 10*3/uL (ref 3.8–10.6)

## 2017-03-30 LAB — PHOSPHORUS: PHOSPHORUS: 2.7 mg/dL (ref 2.5–4.6)

## 2017-03-30 LAB — CK
CK TOTAL: 1091 U/L — AB (ref 49–397)
Total CK: 2123 U/L — ABNORMAL HIGH (ref 49–397)
Total CK: 1625 U/L — ABNORMAL HIGH (ref 49–397)

## 2017-03-30 LAB — MAGNESIUM: Magnesium: 1.9 mg/dL (ref 1.7–2.4)

## 2017-03-30 MED ORDER — SODIUM CHLORIDE 0.9 % IV SOLN
INTRAVENOUS | Status: DC
  Administered 2017-03-30 – 2017-04-01 (×2): via INTRAVENOUS

## 2017-03-30 MED ORDER — INFLUENZA VAC SPLIT QUAD 0.5 ML IM SUSY
0.5000 mL | PREFILLED_SYRINGE | INTRAMUSCULAR | Status: AC
  Administered 2017-03-31: 0.5 mL via INTRAMUSCULAR
  Filled 2017-03-30: qty 0.5

## 2017-03-30 MED ORDER — PNEUMOCOCCAL VAC POLYVALENT 25 MCG/0.5ML IJ INJ
0.5000 mL | INJECTION | INTRAMUSCULAR | Status: AC
Start: 2017-03-31 — End: 2017-03-31
  Administered 2017-03-31: 0.5 mL via INTRAMUSCULAR
  Filled 2017-03-30: qty 0.5

## 2017-03-30 MED ORDER — POTASSIUM CHLORIDE CRYS ER 20 MEQ PO TBCR
40.0000 meq | EXTENDED_RELEASE_TABLET | Freq: Two times a day (BID) | ORAL | Status: AC
  Administered 2017-03-30 (×2): 40 meq via ORAL
  Filled 2017-03-30 (×2): qty 2

## 2017-03-30 MED ORDER — LIVING WELL WITH DIABETES BOOK
Freq: Once | Status: AC
  Administered 2017-03-30: 15:00:00
  Filled 2017-03-30: qty 1

## 2017-03-30 MED ORDER — INSULIN ASPART 100 UNIT/ML ~~LOC~~ SOLN
0.0000 [IU] | Freq: Three times a day (TID) | SUBCUTANEOUS | Status: DC
  Administered 2017-03-30: 1 [IU] via SUBCUTANEOUS
  Administered 2017-03-31: 7 [IU] via SUBCUTANEOUS
  Administered 2017-03-31: 9 [IU] via SUBCUTANEOUS
  Administered 2017-03-31: 7 [IU] via SUBCUTANEOUS
  Administered 2017-04-01: 5 [IU] via SUBCUTANEOUS
  Administered 2017-04-01: 3 [IU] via SUBCUTANEOUS
  Filled 2017-03-30 (×6): qty 1

## 2017-03-30 MED ORDER — INSULIN DETEMIR 100 UNIT/ML ~~LOC~~ SOLN
8.0000 [IU] | Freq: Every day | SUBCUTANEOUS | Status: DC
  Administered 2017-03-30: 8 [IU] via SUBCUTANEOUS
  Filled 2017-03-30: qty 0.08

## 2017-03-30 MED ORDER — POTASSIUM CHLORIDE 10 MEQ/100ML IV SOLN
10.0000 meq | INTRAVENOUS | Status: AC
  Administered 2017-03-30 (×5): 10 meq via INTRAVENOUS
  Filled 2017-03-30 (×5): qty 100

## 2017-03-30 NOTE — Progress Notes (Signed)
[facility] Critical Care Medicine Progess Note    SYNOPSIS   Mr. Jay Flores is a 57 year old gentleman with a past medical history remarkable for renal insufficiency altered mental status, HIV disease, hepatitis C, presented to the emergency room with generalized weakness.He was evaluated in the emergency room blood sugars elevated at 1112, elevated anion gap of 20, sodium is 124, BUN/creatinine is 56/3.24 with a CK of 2226.   ASSESSMENT/PLAN   DKA.  Placed on DKA protocol, insulin infusion, fluid resuscitation  HIV disease.  Patient is noted to be on Truvada but not actively taking. Pending CD4 count and viral load  Renal insufficiency.  Renal failure multifactorial to include dehydration, rhabdomyolysis. Hopefully BUN/creatinine Plateau. Is on saline resuscitation, has received bicarbonate, pending CK from this morning  Rhabdomyolysis.  CK is elevated at 2326.  Pending am CK   INTAKE / OUTPUT:  Intake/Output Summary (Last 24 hours) at 03/30/2017 0926 Last data filed at 03/30/2017 0850 Gross per 24 hour  Intake 3783.4 ml  Output 1415 ml  Net 2368.4 ml     Name: Jay Flores A Irigoyen MRN: [id number] DOB: 05/02/1960    ADMISSION DATE:  03/29/2017  SUBJECTIVE:   Patient feels well, without any complaints, pending transition off of insulin infusion, oral diet.   VITAL SIGNS: Temp:  [97.5 F (36.4 C)-98.6 F (37 C)] 98.1 F (36.7 C) (02/23 0800) Pulse Rate:  [72-96] 78 (02/23 0800) Resp:  [14-29] 20 (02/23 0800) BP: (127-167)/(70-102) 167/91 (02/23 0800) SpO2:  [96 %-100 %] 100 % (02/23 0800) Weight:  [64.4 kg (141 lb 15.6 oz)-68 kg (150 lb)] 64.4 kg (141 lb 15.6 oz) (02/22 1700)  PHYSICAL EXAMINATION: Physical Examination:   VS: BP (!) 167/91 (BP Location: Right Arm)   Pulse 78   Temp 98.1 F (36.7 C) (Oral)   Resp 20   Ht 5\' 9"  (1.753 m)   Wt 64.4 kg (141 lb 15.6 oz)   SpO2 100%   BMI 20.97 kg/m   General Appearance: No distress  Neuro:without focal  findings, mental status normal. HEENT: PERRLA, EOM intact. Pulmonary: normal breath sounds   CardiovascularNormal S1,S2.  No m/r/g.   Abdomen: Benign, Soft, non-tender. Skin:   warm, no rashes, no ecchymosis  Extremities: normal, no cyanosis, clubbing.    LABORATORY PANEL:   CBC Recent Labs  Lab 03/30/17 0528  WBC 6.1  HGB 13.3  HCT 38.7*  PLT 149*    Chemistries  Recent Labs  Lab 03/29/17 1315  03/30/17 0528  NA 124*   < > 142  K 5.6*   < > 3.5  CL 96*   < > 114*  CO2 8*   < > 19*  GLUCOSE 1,112*   < > 177*  BUN 56*   < > 39*  CREATININE 3.24*   < > 2.25*  CALCIUM 8.4*   < > 8.3*  MG  --   --  1.9  PHOS  --   --  2.7  AST 43*  --   --   ALT 57  --   --   ALKPHOS 156*  --   --   BILITOT 1.3*  --   --    < > = values in this interval not displayed.    Recent Labs  Lab 03/30/17 0349 03/30/17 0450 03/30/17 0559 03/30/17 0702 03/30/17 0805 03/30/17 0907  GLUCAP 163* 166* 173* 152* 148* 194*   No results for input(s): PHART, PCO2ART, PO2ART in the last 168 hours. Recent Labs  Lab 03/29/17 1315  AST 43*  ALT 57  ALKPHOS 156*  BILITOT 1.3*  ALBUMIN 2.9*    Cardiac Enzymes Recent Labs  Lab 03/29/17 1315  TROPONINI 0.03*    RADIOLOGY:  No results found.   Tora Kindred, DO  03/30/2017

## 2017-03-30 NOTE — Plan of Care (Addendum)
Patient alert. Remains on Glucose stabilizer. Patient using BSC with assist. Very weak when standing. No acute concerns this shift. Able to make needs known.

## 2017-03-30 NOTE — Progress Notes (Signed)
[employer] [facility] [place] [facility]   PATIENT NAME: Jay PizzaBernard Courville    MR#:  [id number]  DATE OF BIRTH:  01/29/1961  SUBJECTIVE:  CHIEF COMPLAINT:   Chief Complaint  Patient presents with  . Weakness   - Came in with elevated blood sugars, new onset diabetes. Feels much better, remains on insulin drip. -Complains of neuropathy pain in both feet  REVIEW OF SYSTEMS:  Review of Systems  Constitutional: Negative for chills, fever and malaise/fatigue.  HENT: Negative for congestion, hearing loss and nosebleeds.   Eyes: Negative for blurred vision and double vision.  Respiratory: Negative for cough, shortness of breath and wheezing.   Cardiovascular: Negative for chest pain, palpitations and leg swelling.  Gastrointestinal: Positive for nausea. Negative for abdominal pain, constipation, diarrhea and vomiting.  Genitourinary: Negative for dysuria.  Musculoskeletal: Positive for myalgias.  Neurological: Positive for weakness. Negative for dizziness, speech change, seizures and headaches.  Psychiatric/Behavioral: Negative for depression.    DRUG ALLERGIES:  No Known Allergies  VITALS:  Blood pressure (!) 167/91, pulse 78, temperature 98.1 F (36.7 C), temperature source Oral, resp. rate 20, height 5\' 9"  (1.753 m), weight 64.4 kg (141 lb 15.6 oz), SpO2 100 %.  PHYSICAL EXAMINATION:  Physical Exam  GENERAL:  57 y.o.-year-old patient lying in the bed with no acute distress.  EYES: Pupils equal, round, reactive to light and accommodation. No scleral icterus. Extraocular muscles intact.  HEENT: Head atraumatic, normocephalic. Oropharynx and nasopharynx clear.  NECK:  Supple, no jugular venous distention. No thyroid enlargement, no tenderness.  LUNGS: Normal breath sounds bilaterally, no wheezing, rales,rhonchi or crepitation. No use of accessory muscles of respiration.  CARDIOVASCULAR: S1, S2 normal. No murmurs, rubs, or gallops.  ABDOMEN: Soft, nontender,  nondistended. Bowel sounds present. No organomegaly or mass.  EXTREMITIES: No pedal edema, cyanosis, or clubbing.  NEUROLOGIC: Cranial nerves II through XII are intact. Muscle strength 5/5 in all extremities. Sensation intact. Gait not checked.  PSYCHIATRIC: The patient is alert and oriented x 3.  SKIN: No obvious rash, lesion, or ulcer.    LABORATORY PANEL:   CBC Recent Labs  Lab 03/30/17 0528  WBC 6.1  HGB 13.3  HCT 38.7*  PLT 149*   ------------------------------------------------------------------------------------------------------------------  Chemistries  Recent Labs  Lab 03/29/17 1315  03/30/17 0528  NA 124*   < > 142  K 5.6*   < > 3.5  CL 96*   < > 114*  CO2 8*   < > 19*  GLUCOSE 1,112*   < > 177*  BUN 56*   < > 39*  CREATININE 3.24*   < > 2.25*  CALCIUM 8.4*   < > 8.3*  MG  --   --  1.9  AST 43*  --   --   ALT 57  --   --   ALKPHOS 156*  --   --   BILITOT 1.3*  --   --    < > = values in this interval not displayed.   ------------------------------------------------------------------------------------------------------------------  Cardiac Enzymes Recent Labs  Lab 03/29/17 1315  TROPONINI 0.03*   ------------------------------------------------------------------------------------------------------------------  RADIOLOGY:  No results found.  EKG:   Orders placed or performed during the hospital encounter of 03/29/17  . ED EKG  . ED EKG    ASSESSMENT AND PLAN:   57 year old male with past medical history significant for HIV on HAART, hepatitis C presents to hospital secondary to weakness and nausea and noted to have new onset diabetes  1.  New onset diabetes mellitus with ketoacidosis-on insulin drip, bicarbonate levels are still low. Anion gap is closed. -Transition to subcutaneous insulin. -a1c pending -Place on a low-carb diet when able to -Diabetes coordinator and education prior to discharge.  2. Acute renal failure-known history of CK  D, baseline creatinine of 1.7. Has CKD stage III. -pre renal causes and ATN on presentation. Now improving renal function. -If not improving, will check renal ultrasound. Urine with significant glucosuria and proteinuria -Start ARB or ACE inhibitor with his proteinuria  3. Hypertension-currently on Norvasc. Switch to oral prior to discharge due to diabetes  4. HIV- follows with ID specialist. On HAART Known history of HIV dementia, HIV encephalopathy with psychomotor slowing. Evaluated by neurology about a year ago for lower extremity weakness.  5. DVT prophylaxis on subcutaneous heparin  Patient is from a group home. Has had trouble with walking. In the process of getting a wheelchair as outpatient   All the records are reviewed and case discussed with Care Management/Social Workerr. Management plans discussed with the patient, family and they are in agreement.  CODE STATUS: Full code  TOTAL TIME TAKING CARE OF THIS PATIENT: 38 minutes.   POSSIBLE D/C IN 2 DAYS, DEPENDING ON CLINICAL CONDITION.   Enid Baas M.D on 03/30/2017 at 8:56 AM  Between 7am to 6pm - Pager - [phone]  After 6pm go to [email or web address] - Social research officer, government  [facility]  Office  [phone]  CC: Primary care physician; Mick Sell, MD

## 2017-03-30 NOTE — Progress Notes (Signed)
Patient continues on glucose stabilizer for DKA,  Anion gap 9, CO2 19.  Patient alert.  Good urine output. No s/s of hyperglycemia. Patient able to have drinks and food per NP.  BP up and down this shift. Will continue to monitor.

## 2017-03-30 NOTE — Progress Notes (Signed)
Inpatient Diabetes Program Recommendations  AACE/ADA: New Consensus Statement on Inpatient Glycemic Control (2015)  Target Ranges:  Prepandial:   less than 140 mg/dL      Peak postprandial:   less than 180 mg/dL (1-2 hours)      Critically ill patients:  140 - 180 mg/dL   Review of Glycemic Control  Diabetes history: New diagnosis this admission Current orders for Inpatient glycemic control: IV insulin gtt per DKA order set  Inpatient Diabetes Program Recommendations:    Consider c-peptide level due to patient physical presentation. A1c level pending, renal function still elevated but slowly improving, BMET due anytime now. Patient to transition off IV insulin when glucose and Anion Gap is WNL and acidosis is cleared with CO2 normalized.  When patient meets criteria to come off IV insulin Consider placing patient on Levemir 8 units to overlap with IV insulin 2 hours, and using the regular Glycemic Control AC/HS order set Novolog Sensitive Correction 0-9 units tid.  Will speak with patient when A1c results. Suggest ambulatory referral for the lifestyle center for further DM education. Will order the living well with Diabetes educational booklet.  RN staff to reinforce DM education survival skills as DM coordinator is not on campus over the weekend. Will touch base with patient over the phone this weekend as acidosis clears, A1c results, and as patient feels well enough to talk.  RN teach patient DM survival skills before discharge: s/s hypoglycemia and treatment s/s hyperglycemia and treatment A1c results, goal is 7% or less (150 avg glucose) New medications they will be on Check glucose and how often (1-2 times for oral meds, 3-4 times a day with insulin) Teach how to administer insulin with each insulin administration dose scheduled Watching carb intake (45-60 grams/meal, 15 g/snack for females, 60-75 g/meal, 15-30 g/snack for males), plate method, watching beverage  options. Exercising 30 minutes 5 days a week   Thanks,  Christena DeemShannon Tyliyah Mcmeekin RN, MSN, BC-ADM, [facility] Inpatient Diabetes Coordinator Team Pager [phone] (8a-5p)

## 2017-03-31 LAB — MAGNESIUM: MAGNESIUM: 1.7 mg/dL (ref 1.7–2.4)

## 2017-03-31 LAB — BASIC METABOLIC PANEL
ANION GAP: 9 (ref 5–15)
BUN: 25 mg/dL — ABNORMAL HIGH (ref 6–20)
CALCIUM: 8.1 mg/dL — AB (ref 8.9–10.3)
CO2: 16 mmol/L — AB (ref 22–32)
CREATININE: 1.66 mg/dL — AB (ref 0.61–1.24)
Chloride: 113 mmol/L — ABNORMAL HIGH (ref 101–111)
GFR calc Af Amer: 52 mL/min — ABNORMAL LOW (ref >60)
GFR calc non Af Amer: 45 mL/min — ABNORMAL LOW (ref >60)
Glucose, Bld: 355 mg/dL — ABNORMAL HIGH (ref 65–99)
POTASSIUM: 4.3 mmol/L (ref 3.5–5.1)
Sodium: 138 mmol/L (ref 135–145)

## 2017-03-31 LAB — GLUCOSE, CAPILLARY
GLUCOSE-CAPILLARY: 314 mg/dL — AB (ref 65–99)
Glucose-Capillary: 412 mg/dL — ABNORMAL HIGH (ref 65–99)
Glucose-Capillary: 339 mg/dL — ABNORMAL HIGH (ref 65–99)
Glucose-Capillary: 245 mg/dL — ABNORMAL HIGH (ref 65–99)

## 2017-03-31 LAB — CK: CK TOTAL: 507 U/L — AB (ref 49–397)

## 2017-03-31 LAB — PHOSPHORUS: PHOSPHORUS: 1.7 mg/dL — AB (ref 2.5–4.6)

## 2017-03-31 MED ORDER — INSULIN DETEMIR 100 UNIT/ML ~~LOC~~ SOLN
8.0000 [IU] | Freq: Two times a day (BID) | SUBCUTANEOUS | Status: DC
  Administered 2017-03-31: 8 [IU] via SUBCUTANEOUS
  Filled 2017-03-31 (×2): qty 0.08

## 2017-03-31 MED ORDER — INSULIN DETEMIR 100 UNIT/ML ~~LOC~~ SOLN: 15.0000 [IU] | Freq: Every day | SUBCUTANEOUS | Status: DC

## 2017-03-31 MED ORDER — INSULIN ASPART 100 UNIT/ML ~~LOC~~ SOLN
4.0000 [IU] | Freq: Three times a day (TID) | SUBCUTANEOUS | Status: DC
Start: 2017-03-31 — End: 2017-04-01
  Administered 2017-03-31 – 2017-04-01 (×5): 4 [IU] via SUBCUTANEOUS
  Filled 2017-03-31 (×5): qty 1

## 2017-03-31 MED ORDER — K PHOS MONO-SOD PHOS DI & MONO 155-852-130 MG PO TABS
500.0000 mg | ORAL_TABLET | Freq: Two times a day (BID) | ORAL | Status: DC
  Administered 2017-03-31 – 2017-04-01 (×3): 500 mg via ORAL
  Filled 2017-03-31 (×4): qty 2

## 2017-03-31 MED ORDER — INSULIN GLARGINE 100 UNIT/ML ~~LOC~~ SOLN
8.0000 [IU] | Freq: Once | SUBCUTANEOUS | Status: AC
  Administered 2017-03-31: 8 [IU] via SUBCUTANEOUS
  Filled 2017-03-31: qty 0.08

## 2017-03-31 MED ORDER — LOSARTAN POTASSIUM 50 MG PO TABS
50.0000 mg | ORAL_TABLET | Freq: Every day | ORAL | Status: DC
  Administered 2017-04-01: 50 mg via ORAL
  Filled 2017-03-31: qty 1

## 2017-03-31 NOTE — Clinical Social Work Note (Signed)
Clinical Social Work Assessment  Patient Details  Name: Jay Flores MRN: [id number] Date of Birth: [birth year]  Date of referral:  03/31/17               Reason for consult:  Discharge Planning                Permission sought to share information with:  Facility Art therapist granted to share information::  Yes, Verbal Permission Granted  Name::        Agency::     Relationship::     Contact Information:     Housing/Transportation Living arrangements for the past 2 months:  Group Home Source of Information:  Patient Patient Interpreter Needed:  None Criminal Activity/Legal Involvement Pertinent to Current Situation/Hospitalization:  No - Comment as needed Significant Relationships:  Siblings Lives with:  Facility Resident Do you feel safe going back to the place where you live?  Yes Need for family participation in patient care:  No (Coment)  Care giving concerns:  Patient resides at [facility] group home.   Social Worker assessment / plan:  CSW met with patient this morning and explained role and purpose of visit. Patient was alert and oriented X4 at time of assessment. Patient very willing to answer questions. He states that he has been at [facility] for 2 months. He states that he likes it there and wishes to return when time. Patient states that he uses a wheelchair but he also ambulates. Patient currently with no concerns or questions at this time. CSW will touch base with Lavada Mesi, the family care home owner.  Patient's nurse voiced to [place] that it did not appear that patient was on any HIV medications for his diagnosis. CSW advised that Dr. Ola Spurr is listed as his primary care physician and that a consult perhaps should be requested for Dr. Ola Spurr (also Infectious Disease physician).  Employment status:  Disabled (Comment on whether or not currently receiving Disability) Insurance information:  Medicaid In [place] [facility]  Recommendations:    Information / Referral to community resources:     Patient/Family's Response to care:  Patient expressed appreciation for CSW visit.  Patient/Family's Understanding of and Emotional Response to Diagnosis, Current Treatment, and Prognosis:  Patient is a very pleasant man who was engaged in conversation with CSW.   Emotional Assessment Appearance:  Appears stated age Attitude/Demeanor/Rapport:  (pleasant and cooperative) Affect (typically observed):  Calm, Pleasant, Happy Orientation:  Oriented to Self, Oriented to Place, Oriented to  Time, Oriented to Situation Alcohol / Substance use:  Not Applicable Psych involvement (Current and /or in the community):  No (Comment)  Discharge Needs  Concerns to be addressed:  No discharge needs identified Readmission within the last 30 days:  No Current discharge risk:  None Barriers to Discharge:  No Barriers Identified   Shela Leff, LCSW 03/31/2017, 10:25 AM

## 2017-03-31 NOTE — Progress Notes (Signed)
Inpatient Diabetes Program Recommendations  AACE/ADA: New Consensus Statement on Inpatient Glycemic Control (2015)  Target Ranges:  Prepandial:   less than 140 mg/dL      Peak postprandial:   less than 180 mg/dL (1-2 hours)      Critically ill patients:  140 - 180 mg/dL   Review of Glycemic Control  Diabetes history: New Diagnosis this admission Current orders for Inpatient glycemic control: Levemir 8 units BID, Novolog Sensitive Correction 0-9 units tid + Novolog 4 units tid meal coverage tid  Inpatient Diabetes Program Recommendations:    Agree with insulin increase as glucose trends increased this am to 400 range. A1c still pending. Will recheck back today to see if its resulted.   RN staff to continue with DM teaching per previous note yesterday 2/23.  DM survival skills, insulin administration and glucose testing. Patient should have the living well with diabetes educational booklet and be reviewing its contents.  Thanks,  Christena DeemShannon Tamana Hatfield RN, MSN, BC-ADM, [facility] Inpatient Diabetes Coordinator Team Pager [phone] (8a-5p)

## 2017-03-31 NOTE — Plan of Care (Signed)
Patient is alert and oriented. No longer on Glucose stabilizer.  CBGs AC/HS. No s/sx's of hyper/hypoglycemia noted.  All medications taken and tolerated without difficulty.  Able to make needs known.  [facility].  No acute distress noted.  Will continue to monitor.

## 2017-03-31 NOTE — Progress Notes (Signed)
[employer] [facility] [facility] [place] [facility]   PATIENT NAME: Jay Flores    MR#:  [id number]  DATE OF BIRTH:  1960-05-18  SUBJECTIVE:  CHIEF COMPLAINT:   Chief Complaint  Patient presents with  . Weakness   - Sugars remain elevated, off insulin drip and started on Lantus last night -Feeling better. Has neuropathy in both legs  REVIEW OF SYSTEMS:  Review of Systems  Constitutional: Negative for chills, fever and malaise/fatigue.  HENT: Negative for congestion, hearing loss and nosebleeds.   Eyes: Negative for blurred vision and double vision.  Respiratory: Negative for cough, shortness of breath and wheezing.   Cardiovascular: Negative for chest pain, palpitations and leg swelling.  Gastrointestinal: Negative for abdominal pain, constipation, diarrhea, nausea and vomiting.  Genitourinary: Negative for dysuria.  Musculoskeletal: Positive for myalgias.  Neurological: Positive for weakness. Negative for dizziness, speech change, seizures and headaches.  Psychiatric/Behavioral: Negative for depression.    DRUG ALLERGIES:  No Known Allergies  VITALS:  Blood pressure (!) 157/99, pulse (!) 108, temperature 98.9 F (37.2 C), temperature source Oral, resp. rate 18, height 5\' 9"  (1.753 m), weight 64.4 kg (141 lb 15.6 oz), SpO2 99 %.  PHYSICAL EXAMINATION:  Physical Exam  GENERAL:  57 y.o.-year-old patient lying in the bed with no acute distress.  EYES: Pupils equal, round, reactive to light and accommodation. No scleral icterus. Extraocular muscles intact.  HEENT: Head atraumatic, normocephalic. Oropharynx and nasopharynx clear.  NECK:  Supple, no jugular venous distention. No thyroid enlargement, no tenderness.  LUNGS: Normal breath sounds bilaterally, no wheezing, rales,rhonchi or crepitation. No use of accessory muscles of respiration.  CARDIOVASCULAR: S1, S2 normal. No murmurs, rubs, or gallops.  ABDOMEN: Soft, nontender, nondistended. Bowel sounds present. No  organomegaly or mass.  EXTREMITIES: No pedal edema, cyanosis, or clubbing.  NEUROLOGIC: Cranial nerves II through XII are intact. Muscle strength 5/5 in all extremities. Sensation intact. Gait not checked.  PSYCHIATRIC: The patient is alert and oriented x 3.  SKIN: No obvious rash, lesion, or ulcer.    LABORATORY PANEL:   CBC Recent Labs  Lab 03/30/17 0528  WBC 6.1  HGB 13.3  HCT 38.7*  PLT 149*   ------------------------------------------------------------------------------------------------------------------  Chemistries  Recent Labs  Lab 03/29/17 1315  03/31/17 0531  NA 124*   < > 138  K 5.6*   < > 4.3  CL 96*   < > 113*  CO2 8*   < > 16*  GLUCOSE 1,112*   < > 355*  BUN 56*   < > 25*  CREATININE 3.24*   < > 1.66*  CALCIUM 8.4*   < > 8.1*  MG  --    < > 1.7  AST 43*  --   --   ALT 57  --   --   ALKPHOS 156*  --   --   BILITOT 1.3*  --   --    < > = values in this interval not displayed.   ------------------------------------------------------------------------------------------------------------------  Cardiac Enzymes Recent Labs  Lab 03/29/17 1315  TROPONINI 0.03*   ------------------------------------------------------------------------------------------------------------------  RADIOLOGY:  No results found.  EKG:   Orders placed or performed during the hospital encounter of 03/29/17  . ED EKG  . ED EKG    ASSESSMENT AND PLAN:   57 year old male with past medical history significant for HIV on HAART, hepatitis C presents to hospital secondary to weakness and nausea and noted to have new onset diabetes  1. New onset diabetes mellitus  with ketoacidosis-on insulin drip, bicarbonate levels are still low. Anion gap is closed. -Transition to subcutaneous insulin. Started on Lantus twice a day and NovoLog pre-meals, also sliding scale insulin -a1c pending -Place on a low-carb diet  -Diabetes coordinator and education prior to discharge.  2. Acute  renal failure-known history of CK D, baseline creatinine of 1.7. Has CKD stage III. -pre renal causes and ATN on presentation. Now improving renal function. -Start ARB or ACE inhibitor with his proteinuria at discharge  3. Hypertension-currently on Norvasc. Switch to oral ARB or ACEI prior to discharge due to diabetes  4. HIV- follows with ID specialist. On HAART Known history of HIV dementia, HIV encephalopathy with psychomotor slowing. Evaluated by neurology about a year ago for lower extremity weakness.  5. DVT prophylaxis on subcutaneous heparin  Patient is from a group home. Has had trouble with walking. In the process of getting a wheelchair as outpatient   All the records are reviewed and case discussed with Care Management/Social Workerr. Management plans discussed with the patient, family and they are in agreement.  CODE STATUS: Full code  TOTAL TIME TAKING CARE OF THIS PATIENT: 36 minutes.   POSSIBLE D/C IN 1-2 DAYS, DEPENDING ON CLINICAL CONDITION.   Enid BaasKALISETTI,Alyria Krack M.D on 03/31/2017 at 3:27 PM  Between 7am to 6pm - Pager - [phone]  After 6pm go to [email or web address] - Social research officer, governmentpassword EPAS [facility]  [facility]  Office  [phone]  CC: Primary care physician; Mick SellFitzgerald, David P, MD

## 2017-03-31 NOTE — Progress Notes (Signed)
[facility]  Critical Care Medicine Progess Note    SYNOPSIS   Jay Flores is a 57 year old gentleman with a past medical history remarkable for renal insufficiency altered mental status, HIV disease, hepatitis C, presented to the emergency room with generalized weakness.He was evaluated in the emergency room blood sugars elevated at 1112, elevated anion gap of 20, sodium is 124, BUN/creatinine is 56/3.24 with a CK of 2226.   ASSESSMENT/PLAN   DKA.   has resolved, weaned off of IV insulin. Tolerating oral diet well. Anion gap of 9, blood sugar was elevated, given an additional dose of Lantus and changed every 12. Pending repeat blood sugar  HIV disease.   placed on ART. Pending CD4 count and viral load  Renal insufficiency.   has significantly improved   Rhabdomyolysis.  CK is elevated at 507.    Pending floor transfer  INTAKE / OUTPUT:  Intake/Output Summary (Last 24 hours) at 03/31/2017 1014 Last data filed at 03/31/2017 1012 Gross per 24 hour  Intake 3375.97 ml  Output 2240 ml  Net 1135.97 ml     Name: Jay Flores MRN: [id number] DOB: Dec 14, 1960    ADMISSION DATE:  03/29/2017  SUBJECTIVE:   Patient feels well, without any complaints, pending transition off of insulin infusion, oral diet.   VITAL SIGNS: Temp:  [98.2 F (36.8 C)-99.4 F (37.4 C)] 98.9 F (37.2 C) (02/24 0800) Pulse Rate:  [71-93] 93 (02/24 0800) Resp:  [14-25] 21 (02/24 0800) BP: (126-163)/(77-107) 156/96 (02/24 0800) SpO2:  [94 %-100 %] 100 % (02/24 0800)  PHYSICAL EXAMINATION: Physical Examination:   VS: BP (!) 156/96 (BP Location: Right Arm)   Pulse 93   Temp 98.9 F (37.2 C) (Oral)   Resp (!) 21   Ht 5\' 9"  (1.753 m)   Wt 64.4 kg (141 lb 15.6 oz)   SpO2 100%   BMI 20.97 kg/m   General Appearance: No distress  Neuro:without focal findings, mental status normal. HEENT: PERRLA, EOM intact. Pulmonary: normal breath sounds   CardiovascularNormal S1,S2.  No m/r/g.   Abdomen:  Benign, Soft, non-tender. Skin:   warm, no rashes, no ecchymosis  Extremities: normal, no cyanosis, clubbing.    LABORATORY PANEL:   CBC Recent Labs  Lab 03/30/17 0528  WBC 6.1  HGB 13.3  HCT 38.7*  PLT 149*    Chemistries  Recent Labs  Lab 03/29/17 1315  03/31/17 0531  NA 124*   < > 138  K 5.6*   < > 4.3  CL 96*   < > 113*  CO2 8*   < > 16*  GLUCOSE 1,112*   < > 355*  BUN 56*   < > 25*  CREATININE 3.24*   < > 1.66*  CALCIUM 8.4*   < > 8.1*  MG  --    < > 1.7  PHOS  --    < > 1.7*  AST 43*  --   --   ALT 57  --   --   ALKPHOS 156*  --   --   BILITOT 1.3*  --   --    < > = values in this interval not displayed.    Recent Labs  Lab 03/30/17 1411 03/30/17 1506 03/30/17 1611 03/30/17 1716 03/30/17 2139 03/31/17 0737  GLUCAP 172* 164* 148* 130* 202* 412*   No results for input(s): PHART, PCO2ART, PO2ART in the last 168 hours. Recent Labs  Lab 03/29/17 1315  AST 43*  ALT 57  ALKPHOS 156*  BILITOT 1.3*  ALBUMIN 2.9*    Cardiac Enzymes Recent Labs  Lab 03/29/17 1315  TROPONINI 0.03*    RADIOLOGY:  No results found.   Tora KindredJohn Ondria Oswald, DO  03/31/2017

## 2017-04-01 LAB — HELPER T-LYMPH-CD4 (ARMC ONLY)
% CD 4 POS. LYMPH.: 12.9 % — AB (ref 30.8–58.5)
ABSOLUTE CD 4 HELPER: 258 /uL — AB (ref 359–1519)
BASOS ABS: 0 10*3/uL (ref 0.0–0.2)
Basos: 0 %
EOS (ABSOLUTE): 0 10*3/uL (ref 0.0–0.4)
EOS: 0 %
Hematocrit: 44.8 % (ref 37.5–51.0)
Hemoglobin: 14.8 g/dL (ref 13.0–17.7)
IMMATURE GRANULOCYTES: 1 %
Immature Grans (Abs): 0.1 10*3/uL (ref 0.0–0.1)
Lymphocytes Absolute: 2 10*3/uL (ref 0.7–3.1)
Lymphs: 21 %
MCH: 32.7 pg (ref 26.6–33.0)
MCHC: 33 g/dL (ref 31.5–35.7)
MCV: 99 fL — ABNORMAL HIGH (ref 79–97)
MONOS ABS: 0.5 10*3/uL (ref 0.1–0.9)
Monocytes: 5 %
NEUTROS PCT: 73 %
Neutrophils Absolute: 7 10*3/uL (ref 1.4–7.0)
PLATELETS: 184 10*3/uL (ref 150–379)
RBC: 4.53 x10E6/uL (ref 4.14–5.80)
RDW: 13.4 % (ref 12.3–15.4)
WBC: 9.6 10*3/uL (ref 3.4–10.8)

## 2017-04-01 LAB — BASIC METABOLIC PANEL
ANION GAP: 6 (ref 5–15)
Anion gap: 7 (ref 5–15)
BUN: 21 mg/dL — ABNORMAL HIGH (ref 6–20)
BUN: 19 mg/dL (ref 6–20)
CHLORIDE: 106 mmol/L (ref 101–111)
CHLORIDE: 108 mmol/L (ref 101–111)
CO2: 18 mmol/L — AB (ref 22–32)
CO2: 17 mmol/L — ABNORMAL LOW (ref 22–32)
CREATININE: 1.5 mg/dL — AB (ref 0.61–1.24)
Calcium: 7.3 mg/dL — ABNORMAL LOW (ref 8.9–10.3)
Calcium: 7.4 mg/dL — ABNORMAL LOW (ref 8.9–10.3)
Creatinine, Ser: 1.47 mg/dL — ABNORMAL HIGH (ref 0.61–1.24)
GFR calc Af Amer: >60 mL/min (ref >60)
GFR calc non Af Amer: 51 mL/min — ABNORMAL LOW (ref >60)
GFR, EST AFRICAN AMERICAN: 59 mL/min — AB (ref >60)
GFR, EST NON AFRICAN AMERICAN: 52 mL/min — AB (ref >60)
GLUCOSE: 276 mg/dL — AB (ref 65–99)
Glucose, Bld: 268 mg/dL — ABNORMAL HIGH (ref 65–99)
POTASSIUM: 2.8 mmol/L — AB (ref 3.5–5.1)
POTASSIUM: 3.8 mmol/L (ref 3.5–5.1)
SODIUM: 131 mmol/L — AB (ref 135–145)
Sodium: 131 mmol/L — ABNORMAL LOW (ref 135–145)

## 2017-04-01 LAB — GLUCOSE, CAPILLARY
GLUCOSE-CAPILLARY: 269 mg/dL — AB (ref 65–99)
GLUCOSE-CAPILLARY: 198 mg/dL — AB (ref 65–99)
Glucose-Capillary: 214 mg/dL — ABNORMAL HIGH (ref 65–99)

## 2017-04-01 LAB — CBC WITH DIFFERENTIAL/PLATELET
Basophils Absolute: 0 10*3/uL (ref 0–0.1)
Basophils Relative: 0 %
EOS ABS: 0 10*3/uL (ref 0–0.7)
Eosinophils Relative: 0 %
HEMATOCRIT: 35.9 % — AB (ref 40.0–52.0)
HEMOGLOBIN: 12.3 g/dL — AB (ref 13.0–18.0)
LYMPHS ABS: 2.1 10*3/uL (ref 1.0–3.6)
LYMPHS PCT: 34 %
MCH: 32.7 pg (ref 26.0–34.0)
MCHC: 34.4 g/dL (ref 32.0–36.0)
MCV: 95.3 fL (ref 80.0–100.0)
MONOS PCT: 8 %
Monocytes Absolute: 0.5 10*3/uL (ref 0.2–1.0)
NEUTROS ABS: 3.4 10*3/uL (ref 1.4–6.5)
NEUTROS PCT: 58 %
Platelets: 104 10*3/uL — ABNORMAL LOW (ref 150–440)
RBC: 3.76 MIL/uL — AB (ref 4.40–5.90)
RDW: 12.7 % (ref 11.5–14.5)
WBC: 6 10*3/uL (ref 3.8–10.6)

## 2017-04-01 LAB — MISC LABCORP TEST (SEND OUT): LABCORP TEST CODE: 550630

## 2017-04-01 LAB — MAGNESIUM: Magnesium: 1.6 mg/dL — ABNORMAL LOW (ref 1.7–2.4)

## 2017-04-01 LAB — HEMOGLOBIN A1C
Hgb A1c MFr Bld: 11.2 % — ABNORMAL HIGH (ref 4.8–5.6)
Mean Plasma Glucose: 275 mg/dL

## 2017-04-01 LAB — PHOSPHORUS: Phosphorus: 3.1 mg/dL (ref 2.5–4.6)

## 2017-04-01 MED ORDER — AZITHROMYCIN 600 MG PO TABS: 1200.0000 mg | ORAL_TABLET | ORAL | 0 refills | Status: DC

## 2017-04-01 MED ORDER — INSULIN ASPART PROT & ASPART (70-30 MIX) 100 UNIT/ML ~~LOC~~ SUSP: 15.0000 [IU] | Freq: Two times a day (BID) | SUBCUTANEOUS | 11 refills | Status: DC

## 2017-04-01 MED ORDER — LOSARTAN POTASSIUM 50 MG PO TABS: 50.0000 mg | ORAL_TABLET | Freq: Every day | ORAL | 3 refills | Status: DC

## 2017-04-01 MED ORDER — ADULT MULTIVITAMIN W/MINERALS CH: 1.0000 | ORAL_TABLET | Freq: Every day | ORAL | Status: DC

## 2017-04-01 MED ORDER — PREMIER PROTEIN SHAKE: 11.0000 [oz_av] | Freq: Two times a day (BID) | ORAL | Status: DC

## 2017-04-01 MED ORDER — INSULIN DETEMIR 100 UNIT/ML ~~LOC~~ SOLN
12.0000 [IU] | Freq: Two times a day (BID) | SUBCUTANEOUS | Status: DC
  Administered 2017-04-01: 12 [IU] via SUBCUTANEOUS
  Filled 2017-04-01 (×3): qty 0.12

## 2017-04-01 MED ORDER — POTASSIUM CHLORIDE CRYS ER 20 MEQ PO TBCR
40.0000 meq | EXTENDED_RELEASE_TABLET | ORAL | Status: AC
  Administered 2017-04-01 (×2): 40 meq via ORAL
  Filled 2017-04-01 (×2): qty 2

## 2017-04-01 MED ORDER — ADULT MULTIVITAMIN W/MINERALS CH: 1.0000 | ORAL_TABLET | Freq: Every day | ORAL | 3 refills | Status: DC

## 2017-04-01 MED ORDER — INSULIN STARTER KIT- PEN NEEDLES (ENGLISH)
1.0000 | Freq: Once | Status: AC
  Administered 2017-04-01: 1
  Filled 2017-04-01: qty 1

## 2017-04-01 MED ORDER — INSULIN DETEMIR 100 UNIT/ML ~~LOC~~ SOLN: 11.0000 [IU] | Freq: Two times a day (BID) | SUBCUTANEOUS | Status: DC

## 2017-04-01 MED ORDER — ABACAVIR-DOLUTEGRAVIR-LAMIVUD 600-50-300 MG PO TABS: 1.0000 | ORAL_TABLET | Freq: Every day | ORAL | 3 refills | Status: DC

## 2017-04-01 MED ORDER — INSULIN DETEMIR 100 UNIT/ML ~~LOC~~ SOLN: 15.0000 [IU] | Freq: Two times a day (BID) | SUBCUTANEOUS | 11 refills | Status: DC

## 2017-04-01 MED ORDER — MAGNESIUM OXIDE 400 (241.3 MG) MG PO TABS
800.0000 mg | ORAL_TABLET | Freq: Once | ORAL | Status: AC
  Administered 2017-04-01: 800 mg via ORAL
  Filled 2017-04-01: qty 2

## 2017-04-01 MED ORDER — EMTRICITABINE-TENOFOVIR DF 200-300 MG PO TABS: 1.0000 | ORAL_TABLET | ORAL | 1 refills | Status: DC

## 2017-04-01 MED ORDER — BLOOD GLUCOSE MONITOR KIT: PACK | 0 refills | Status: DC

## 2017-04-01 MED ORDER — "INSULIN SYRINGE-NEEDLE U-100 31G X 15/64"" 0.5 ML MISC": 100.0000 | Freq: Two times a day (BID) | 3 refills | Status: DC

## 2017-04-01 NOTE — Progress Notes (Signed)
Initial Nutrition Assessment  DOCUMENTATION CODES:   Not applicable  INTERVENTION:   Premier Protein BID, each supplement provides 160 kcal and 30 grams of protein.   MVI daily   Liberalize diet   NUTRITION DIAGNOSIS:   Increased nutrient needs related to chronic illness(HIV) as evidenced by increased estimated needs from protein.  GOAL:   Patient will meet greater than or equal to 90% of their needs  MONITOR:   PO intake, Supplement acceptance, Weight trends, Labs, I & O's, Skin  REASON FOR ASSESSMENT:   Consult Diet education  ASSESSMENT:   57 year old male with past medical history significant for HIV on HAART, hepatitis C presents to hospital secondary to weakness and nausea and noted to have new onset diabetes   Met with pt in room today. Pt reports good appetite and oral intake at baseline. Pt lives in a group home where he is provided three meals a day. Pt reports that he is unable to choose what food items he receives at meals; he just gets served what's available. Pt reports that he does not get served sweets very often but he does get sodas. RD provided basic diabetes education today that mainly focused on limiting sweets and sodas. Encouraged pt to drink water instead of soda. Pt is currently eating 50-75% of meals. RD will order Premier Protein to help pt meet his estimated protein needs. Pt reports that he is weight stable; there is no recent weight history in chart to confirm. Pt noted to have severe muscle wasting in his BLE; pt is wheelchair bound at baseline. Pt noted to have low P and K today; monitor and replete per MD discretion.    Medications reviewed and include: azithromycin, heparin, insulin, K Phos, NaCl '@75ml' /hr   Labs reviewed: Na 131(L), K 2.8(L), BUN 21(H), creat 1.5(H), Ca 7.3(L) P 1.7(L), Mg 1.7 wnl- 2/24 CK 507(H)- 2/24 cbgs- 355, 269 x 24 hrs AIC 11.2(H)- 2/23  Nutrition-Focused physical exam completed. Findings are no fat depletion, no  muscle depletion, and no edema.   Diet Order:  Diet - low sodium heart healthy Diet Carb Modified Diet Carb Modified Fluid consistency: Thin; Room service appropriate? Yes  EDUCATION NEEDS:   Education needs have been addressed  Skin:  Reviewed RN Assessment  Last BM:  2/25- type 4  Height:   Ht Readings from Last 1 Encounters:  03/29/17 '5\' 9"'  (1.753 m)    Weight:   Wt Readings from Last 1 Encounters:  03/29/17 141 lb 15.6 oz (64.4 kg)    Ideal Body Weight:  72.7 kg  BMI:  Body mass index is 20.97 kg/m.  Estimated Nutritional Needs:   Kcal:  1900-2200kcal/day   Protein:  83-96g/day   Fluid:  >1.9L/day   Koleen Distance MS, RD, LDN Pager #[phone] After Hours Pager: [phone]

## 2017-04-01 NOTE — Progress Notes (Signed)
Pt cleared for discharge. IV removed, pt dressed, belongings gathered. Diabetes management education given. Using teachback method, pt verbalized signs that his blood sugar is high or low. Pt has successfully given himself 2 insulin injections with little help. AVS printed. Lawanda at Group home contacted. Pt will be picked up by Onalee Huaavid from their transportation service.

## 2017-04-01 NOTE — Progress Notes (Signed)
Patient pleasant resting overnight, no complaints or request for needs. Orders received for patient transfer, patient transported by wheelchair.  See CHL for further updates.

## 2017-04-01 NOTE — NC FL2 (Signed)
[place] LEVEL OF CARE SCREENING TOOL     IDENTIFICATION  Patient Name: Jay Flores Birthdate: 1960-10-17 Sex: male Admission Date (Current Location): 03/29/2017  [place] and [place] Number:  Engineering geologist and Address:  [facility], [address], [place], [place] [address]      Provider Number: [id number]  Attending Physician Name and Address:  Gladstone Lighter, MD  Relative Name and Phone Number:       Current Level of Care: Hospital Recommended Level of Care: [facility] Prior Approval Number:    Date Approved/Denied:   PASRR Number:    Discharge Plan: (group home)    Current Diagnoses: Patient Active Problem List   Diagnosis Date Noted  . DKA (diabetic ketoacidoses) ([place]) 03/29/2017  . Metabolic acidosis 91/47/8295  . Hypokalemia 01/23/2012  . PNA (pneumonia) 01/22/2012  . HIV (human immunodeficiency virus infection) ([place]) 01/22/2012  . Brain lesion 01/22/2012  . Acute renal failure ([place]) 01/22/2012  . Anemia 01/22/2012  . Altered mental status 01/22/2012    Orientation RESPIRATION BLADDER Height & Weight     Self, Time, Situation, Place  Normal Continent Weight: 141 lb 15.6 oz (64.4 kg) Height:  '5\' 9"'  (175.3 cm)  BEHAVIORAL SYMPTOMS/MOOD NEUROLOGICAL BOWEL NUTRITION STATUS  (none) (none) Continent Diet(heart healthy/carb modified)  AMBULATORY STATUS COMMUNICATION OF NEEDS Skin   Limited Assist(uses wheelchair as well) Verbally Normal                       Personal Care Assistance Level of Assistance              Functional Limitations Info  (no issues)          SPECIAL CARE FACTORS FREQUENCY                       Contractures Contractures Info: Not present    Additional Factors Info                  DISCHARGE MEDICATIONS:   Allergies as of 04/01/2017   No Known Allergies             Medication List     STOP taking these medications   amLODipine 5  MG tablet Commonly known as:  NORVASC   azithromycin 600 MG tablet Commonly known as:  ZITHROMAX   emtricitabine-tenofovir 200-300 MG tablet Commonly known as:  TRUVADA   sulfamethoxazole-trimethoprim 800-160 MG per tablet Commonly known as:  BACTRIM DS     TAKE these medications   abacavir-dolutegravir-lamiVUDine 600-50-300 MG tablet Commonly known as:  TRIUMEQ Take 1 tablet by mouth daily.   blood glucose meter kit and supplies Kit Dispense based on patient and insurance preference. Use up to two to four times daily as directed. (FOR ICD-9 250.00, 250.01). Fasting sugars and evening sugars   insulin aspart protamine- aspart (70-30) 100 UNIT/ML injection Commonly known as:  NOVOLOG MIX 70/30 Inject 0.15 mLs (15 Units total) into the skin 2 (two) times daily with a meal.   Insulin Syringe-Needle U-100 31G X 15/64" 0.5 ML Misc Commonly known as:  BD INSULIN SYRINGE ULTRAFINE 100 Devices by Does not apply route 2 (two) times daily. Use to inject insulin as directed   losartan 50 MG tablet Commonly known as:  COZAAR Take 1 tablet (50 mg total) by mouth daily. Start taking on:  04/02/2017   multivitamin with minerals Tabs tablet Take 1 tablet by mouth  daily.          Shela Leff, LCSW

## 2017-04-01 NOTE — Care Management (Signed)
Patient to discharge back to group home today.  Patient has RW and WC at the group home.  PT has assessed the patient and recommends home health PT. Patient agreeable to services and does not have preference of agency.  CSW has spoken with group home and their preference of agency is [facility].  Referral has been made to MalottKimberly with [facility].  Cala BradfordKimberly notified that patient would require education and reinforcement of diabetes information.  Requested home health orders from MD.  [facility] signing off.

## 2017-04-01 NOTE — Discharge Summary (Signed)
[place] at [place] NAME: Jay Flores    MR#:  [id number]  DATE OF BIRTH:  1960-08-05  DATE OF ADMISSION:  03/29/2017   ADMITTING PHYSICIAN: Saundra Shelling, MD  DATE OF DISCHARGE: 04/01/17  PRIMARY CARE PHYSICIAN: Leonel Ramsay, MD   ADMISSION DIAGNOSIS:   Acidosis [E87.2] Hyperosmolar non-ketotic state in patient with type 2 diabetes mellitus ([place]) [E11.01]  DISCHARGE DIAGNOSIS:   Active Problems:   DKA (diabetic ketoacidoses) ([place])   SECONDARY DIAGNOSIS:   Past Medical History:  Diagnosis Date  . Acute renal failure ([place])   . Altered mental status 01/20/2012  . HIV (human immunodeficiency virus infection) [facility])     HOSPITAL COURSE:   57 year old male with past medical history significant for HIV on HAART, hepatitis C presents to hospital secondary to weakness and nausea and noted to have new onset diabetes  1. New onset diabetes mellitus with ketoacidosis- was started on insulin drip- now converted to BID novolog based on diabetes coordinator input as it will give him basal coverage and meal coverage as well. -  sugars are in 200 range. -a1c of 11.2 - on a low-carb diet  -Diabetes coordinator and education prior to discharge.  2. Acute renal failure-known history of CKD, baseline creatinine of 1.7. Has CKD stage III. -pre renal causes and ATN on presentation. Now improved renal function. -Started  losartan with his proteinuria   3. Hypertension-on Norvasc and losartan  4. HIV- follows with ID specialist. On HAART- last CD4 count >300 Only on triumeq for HIV, not on any prophylaxis Treated for Hep C and last viral load was iundetectable Known history of HIV dementia, HIV encephalopathy with psychomotor slowing. Evaluated by neurology about a year ago for lower extremity weakness. -Continue outpatient follow-up with ID  5. Hypokalemia-replaced prior to discharge  Patient is from a group home.  Wheelchair bound at baseline   DISCHARGE CONDITIONS:   Guarded  CONSULTS OBTAINED:   Treatment Team:  Hermelinda Dellen, DO Leonel Ramsay, MD  DRUG ALLERGIES:   No Known Allergies DISCHARGE MEDICATIONS:   Allergies as of 04/01/2017   No Known Allergies     Medication List    STOP taking these medications   amLODipine 5 MG tablet Commonly known as:  NORVASC   azithromycin 600 MG tablet Commonly known as:  ZITHROMAX   emtricitabine-tenofovir 200-300 MG tablet Commonly known as:  TRUVADA   sulfamethoxazole-trimethoprim 800-160 MG per tablet Commonly known as:  BACTRIM DS     TAKE these medications   abacavir-dolutegravir-lamiVUDine 600-50-300 MG tablet Commonly known as:  TRIUMEQ Take 1 tablet by mouth daily.   blood glucose meter kit and supplies Kit Dispense based on patient and insurance preference. Use up to two to four times daily as directed. (FOR ICD-9 250.00, 250.01). Fasting sugars and evening sugars   insulin aspart protamine- aspart (70-30) 100 UNIT/ML injection Commonly known as:  NOVOLOG MIX 70/30 Inject 0.15 mLs (15 Units total) into the skin 2 (two) times daily with a meal.   Insulin Syringe-Needle U-100 31G X 15/64" 0.5 ML Misc Commonly known as:  BD INSULIN SYRINGE ULTRAFINE 100 Devices by Does not apply route 2 (two) times daily. Use to inject insulin as directed   losartan 50 MG tablet Commonly known as:  COZAAR Take 1 tablet (50 mg total) by mouth daily. Start taking on:  04/02/2017   multivitamin with minerals Tabs tablet Take 1 tablet by mouth daily.  DISCHARGE INSTRUCTIONS:   1. PCP f/u in 1 week  DIET:   Carb controlled diet  ACTIVITY:   Activity as tolerated  OXYGEN:   Home Oxygen: No.  Oxygen Delivery: room air  DISCHARGE LOCATION:   group home   If you experience worsening of your admission symptoms, develop shortness of breath, life threatening emergency, suicidal or homicidal thoughts you must  seek medical attention immediately by calling 911 or calling your MD immediately  if symptoms less severe.  You Must read complete instructions/literature along with all the possible adverse reactions/side effects for all the Medicines you take and that have been prescribed to you. Take any new Medicines after you have completely understood and accpet all the possible adverse reactions/side effects.   Please note  You were cared for by a hospitalist during your hospital stay. If you have any questions about your discharge medications or the care you received while you were in the hospital after you are discharged, you can call the unit and asked to speak with the hospitalist on call if the hospitalist that took care of you is not available. Once you are discharged, your primary care physician will handle any further medical issues. Please note that NO REFILLS for any discharge medications will be authorized once you are discharged, as it is imperative that you return to your primary care physician (or establish a relationship with a primary care physician if you do not have one) for your aftercare needs so that they can reassess your need for medications and monitor your lab values.    On the day of Discharge:  VITAL SIGNS:   Blood pressure (!) 146/87, pulse 72, temperature 98 F (36.7 C), temperature source Axillary, resp. rate 19, height _0  (1.753 m), weight 64.4 kg (141 lb 15.6 oz), SpO2 100 %.  PHYSICAL EXAMINATION:    GENERAL:  57 y.o.-year-old patient lying in the bed with no acute distress.  EYES: Pupils equal, round, reactive to light and accommodation. No scleral icterus. Extraocular muscles intact.  HEENT: Head atraumatic, normocephalic. Oropharynx and nasopharynx clear.  NECK:  Supple, no jugular venous distention. No thyroid enlargement, no tenderness.  LUNGS: Normal breath sounds bilaterally, no wheezing, rales,rhonchi or crepitation. No use of accessory muscles of respiration.   CARDIOVASCULAR: S1, S2 normal. No murmurs, rubs, or gallops.  ABDOMEN: Soft, nontender, nondistended. Bowel sounds present. No organomegaly or mass.  EXTREMITIES: No pedal edema, cyanosis, or clubbing.  NEUROLOGIC: Cranial nerves II through XII are intact. Muscle strength 5/5 in all extremities. Sensation intact. Gait not checked. Bilateral lower extremity weakness PSYCHIATRIC: The patient is alert and oriented x 3.  SKIN: No obvious rash, lesion, or ulcer.     DATA REVIEW:   CBC Recent Labs  Lab 04/01/17 0521  WBC 6.0  HGB 12.3*  HCT 35.9*  PLT 104*    Chemistries  Recent Labs  Lab 03/29/17 1315  03/31/17 0531 04/01/17 0521  NA 124*   < > 138 131*  K 5.6*   < > 4.3 2.8*  CL 96*   < > 113* 106  CO2 8*   < > 16* 18*  GLUCOSE 1,112*   < > 355* 268*  BUN 56*   < > 25* 21*  CREATININE 3.24*   < > 1.66* 1.50*  CALCIUM 8.4*   < > 8.1* 7.3*  MG  --    < > 1.7  --   AST 43*  --   --   --  ALT 57  --   --   --   ALKPHOS 156*  --   --   --   BILITOT 1.3*  --   --   --    < > = values in this interval not displayed.     Microbiology Results  Results for orders placed or performed during the hospital encounter of 03/29/17  MRSA PCR Screening     Status: None   Collection Time: 03/29/17  5:07 PM  Result Value Ref Range Status   MRSA by PCR NEGATIVE NEGATIVE Final    Comment:        The GeneXpert MRSA Assay (FDA approved for NASAL specimens only), is one component of a comprehensive MRSA colonization surveillance program. It is not intended to diagnose MRSA infection nor to guide or monitor treatment for MRSA infections. Performed at [facility], [address] [place], [place] [address]     RADIOLOGY:  No results found.   Management plans discussed with the patient, family and they are in agreement.  CODE STATUS:     Code Status Orders  (From admission, onward)        Start     Ordered   03/29/17 1706  Full code  Continuous      03/29/17 1705    Code Status History    Date Active Date Inactive Code Status Order ID Comments User Context   01/22/2012 21:18 01/29/2012 16:08 Full Code [id number]  Derrill Kay, MD Inpatient      TOTAL TIME TAKING CARE OF THIS PATIENT: 38 minutes.    Gladstone Lighter M.D on 04/01/2017 at 2:16 PM  Between 7am to 6pm - Pager - [phone]  After 6pm go to [email or web address] - Proofreader  [facility]  Office  [phone]  CC: Primary care physician; Leonel Ramsay, MD   Note: This dictation was prepared with Dragon dictation along with smaller phrase technology. Any transcriptional errors that result from this process are unintentional.

## 2017-04-01 NOTE — Clinical Social Work Note (Addendum)
Patient to discharge today to return to his group home. CSW contacted Jimmye NormanLawanda Ray and she stated she would provide transportation for patient when time today. Discharge information unable to be sent via Epic and not able to link patient's facility in epic due to patient's facility not being an option. York SpanielMonica Ahmaud Duthie MSW,LCSW [phone]

## 2017-04-01 NOTE — Evaluation (Signed)
Occupational Therapy Evaluation Patient Details Name: Jay Flores A Volkman MRN: [id number] DOB: 02/20/1960 Today's Date: 04/01/2017    History of Present Illness Pt is a 57 year old male with PMHx significant for HIV on HAART, hepatitis C, HTN, CKDIII presents to hospital secondary to weakness and nausea and noted to have new onset diabetes with ketoacidosis.    Clinical Impression   Pt seen for OT evaluation this date. PTA, pt notes he was ambulating with a RW until about 1 month ago when he states "the virus took my balance" and since he has been in a w/c primarily, independent with w/c transfers, basic ADL tasks, and receiving assist from group home for meals/medication/transportation. Currently pt presents with deficits in strength, balance, activity tolerance, and notes mild lightheadedness with transition to EOB. BP at start of session documented by CNA in room. OT unable to locate available Dinamap once pt was sitting EOB. Pt able to perform doffing/donning socks with supervision to min guard for sitting balance and supervision for lateral scoot transfers along EOB. Pt will benefit from skilled OT services to address noted impairments and functional deficits including chronic disease mgt education/training for newly diagnosed diabetes, w/c training/mgt, self care training from w/c level, and falls prevention strategies in order to maximize safety and self management of disease. Recommend HHOT services back at group home.    Follow Up Recommendations  Home health OT    Equipment Recommendations  Other (comment)(tbd)    Recommendations for Other Services       Precautions / Restrictions Precautions Precautions: Fall Restrictions Weight Bearing Restrictions: No      Mobility Bed Mobility Overal bed mobility: Needs Assistance Bed Mobility: Supine to Sit;Sit to Supine     Supine to sit: Supervision;HOB elevated Sit to supine: Supervision   General bed mobility comments: good  effort, supervision for safety  Transfers Overall transfer level: Needs assistance   Transfers: Lateral/Scoot Transfers          Lateral/Scoot Transfers: Supervision General transfer comment: good technique, balance    Balance Overall balance assessment: Needs assistance Sitting-balance support: No upper extremity supported;Feet supported Sitting balance-Leahy Scale: Good                                     ADL either performed or assessed with clinical judgement   ADL Overall ADL's : Needs assistance/impaired Eating/Feeding: Independent   Grooming: Sitting;Independent   Upper Body Bathing: Sitting;Supervision/ safety   Lower Body Bathing: Sitting/lateral leans;Min guard   Upper Body Dressing : Sitting;Independent   Lower Body Dressing: Sitting/lateral leans;Sit to/from stand;Min guard Lower Body Dressing Details (indicate cue type and reason): seated EOB, pt able to doff/don socks by crossing leg over opposity leg, would require min guard during transitional movements to complete donning of pants over hips Toilet Transfer: Squat-pivot;Min guard;BSC   Toileting- Clothing Manipulation and Hygiene: Independent;Sitting/lateral lean               Vision Baseline Vision/History: No visual deficits Patient Visual Report: No change from baseline Vision Assessment?: No apparent visual deficits     Perception     Praxis      Pertinent Vitals/Pain Pain Assessment: No/denies pain     Hand Dominance Left   Extremity/Trunk Assessment Upper Extremity Assessment Upper Extremity Assessment: Overall WFL for tasks assessed   Lower Extremity Assessment Lower Extremity Assessment: Defer to PT evaluation;Generalized weakness(grossly at least 3+/5, neuropathy bilaterally)  Cervical / Trunk Assessment Cervical / Trunk Assessment: Normal   Communication Communication Communication: No difficulties   Cognition Arousal/Alertness: Awake/alert Behavior  During Therapy: WFL for tasks assessed/performed Overall Cognitive Status: Within Functional Limits for tasks assessed                                     General Comments       Exercises     Shoulder Instructions      Home Living Family/patient expects to be discharged to:: Group home                                        Prior Functioning/Environment Level of Independence: Independent with assistive device(s)        Comments: Pt reports modified indep with RW for mobility until approx 1 mo. ago when "virus took my balance" and has been in a w/c since, indep with w/c transfers and indep with basic ADL tasks. Group home manages cooking/cleaning/med mgt/transportation. Pt endorses no falls, low literacy and difficulty with reading        OT Problem List: Decreased strength;Decreased knowledge of use of DME or AE;Decreased activity tolerance;Decreased safety awareness;Impaired balance (sitting and/or standing)      OT Treatment/Interventions: Self-care/ADL training;Balance training;Therapeutic exercise;Therapeutic activities;DME and/or AE instruction;Patient/family education    OT Goals(Current goals can be found in the care plan section) Acute Rehab OT Goals Patient Stated Goal: get better and go back to group home OT Goal Formulation: With patient Time For Goal Achievement: 04/15/17 Potential to Achieve Goals: Good ADL Goals Pt Will Perform Lower Body Dressing: with modified independence;sitting/lateral leans Pt Will Transfer to Toilet: with min guard assist;bedside commode;stand pivot transfer  OT Frequency: Min 1X/week   Barriers to D/C:            Co-evaluation              AM-PAC PT "6 Clicks" Daily Activity     Outcome Measure Help from another person eating meals?: None Help from another person taking care of personal grooming?: None Help from another person toileting, which includes using toliet, bedpan, or urinal?: A  Little Help from another person bathing (including washing, rinsing, drying)?: A Little Help from another person to put on and taking off regular upper body clothing?: None Help from another person to put on and taking off regular lower body clothing?: A Little 6 Click Score: 21   End of Session    Activity Tolerance: Patient tolerated treatment well Patient left: in bed;with call bell/phone within reach;with bed alarm set  OT Visit Diagnosis: Other abnormalities of gait and mobility (R26.89);Muscle weakness (generalized) (M62.81)                Time: 4098-1191 OT Time Calculation (min): 18 min Charges:  OT General Charges $OT Visit: 1 Visit OT Evaluation $OT Eval Low Complexity: 1 Low  Richrd Prime, MPH, MS, OTR/L ascom [phone] 04/01/17, 10:39 AM

## 2017-04-01 NOTE — Progress Notes (Signed)
[facility] [place] [facility]   PATIENT NAME: Jay Flores    MR#:  [id number]  DATE OF BIRTH:  12/13/1960  SUBJECTIVE:  CHIEF COMPLAINT:   Chief Complaint  Patient presents with  . Weakness   -  Feels much better, sugars are improved and in 200 range - low potassium  REVIEW OF SYSTEMS:  Review of Systems  Constitutional: Negative for chills, fever and malaise/fatigue.  HENT: Negative for congestion, hearing loss and nosebleeds.   Eyes: Negative for blurred vision and double vision.  Respiratory: Negative for cough, shortness of breath and wheezing.   Cardiovascular: Negative for chest pain, palpitations and leg swelling.  Gastrointestinal: Negative for abdominal pain, constipation, diarrhea, nausea and vomiting.  Genitourinary: Negative for dysuria.  Musculoskeletal: Positive for myalgias.       Chronic lower extremity pain  Neurological: Negative for dizziness, speech change, seizures, weakness and headaches.  Psychiatric/Behavioral: Negative for depression.    DRUG ALLERGIES:  No Known Allergies  VITALS:  Blood pressure 139/90, pulse 75, temperature 97.9 F (36.6 C), temperature source Oral, resp. rate 18, height 5\' 9"  (1.753 m), weight 64.4 kg (141 lb 15.6 oz), SpO2 97 %.  PHYSICAL EXAMINATION:  Physical Exam  GENERAL:  57 y.o.-year-old patient lying in the bed with no acute distress.  EYES: Pupils equal, round, reactive to light and accommodation. No scleral icterus. Extraocular muscles intact.  HEENT: Head atraumatic, normocephalic. Oropharynx and nasopharynx clear.  NECK:  Supple, no jugular venous distention. No thyroid enlargement, no tenderness.  LUNGS: Normal breath sounds bilaterally, no wheezing, rales,rhonchi or crepitation. No use of accessory muscles of respiration.  CARDIOVASCULAR: S1, S2 normal. No murmurs, rubs, or gallops.  ABDOMEN: Soft, nontender, nondistended. Bowel sounds present. No organomegaly or mass.    EXTREMITIES: No pedal edema, cyanosis, or clubbing.  NEUROLOGIC: Cranial nerves II through XII are intact. Muscle strength 5/5 in all extremities. Sensation intact. Gait not checked.  PSYCHIATRIC: The patient is alert and oriented x 3.  SKIN: No obvious rash, lesion, or ulcer.    LABORATORY PANEL:   CBC Recent Labs  Lab 04/01/17 0521  WBC 6.0  HGB 12.3*  HCT 35.9*  PLT 104*   ------------------------------------------------------------------------------------------------------------------  Chemistries  Recent Labs  Lab 03/29/17 1315  03/31/17 0531 04/01/17 0521  NA 124*   < > 138 131*  K 5.6*   < > 4.3 2.8*  CL 96*   < > 113* 106  CO2 8*   < > 16* 18*  GLUCOSE 1,112*   < > 355* 268*  BUN 56*   < > 25* 21*  CREATININE 3.24*   < > 1.66* 1.50*  CALCIUM 8.4*   < > 8.1* 7.3*  MG  --    < > 1.7  --   AST 43*  --   --   --   ALT 57  --   --   --   ALKPHOS 156*  --   --   --   BILITOT 1.3*  --   --   --    < > = values in this interval not displayed.   ------------------------------------------------------------------------------------------------------------------  Cardiac Enzymes Recent Labs  Lab 03/29/17 1315  TROPONINI 0.03*   ------------------------------------------------------------------------------------------------------------------  RADIOLOGY:  No results found.  EKG:   Orders placed or performed during the hospital encounter of 03/29/17  . ED EKG  . ED EKG    ASSESSMENT AND PLAN:   57 year old male with past medical history significant  for HIV on HAART, hepatitis C presents to hospital secondary to weakness and nausea and noted to have new onset diabetes  1. New onset diabetes mellitus with ketoacidosis- was started on insulin drip- now converted to BID lantus, sugars are in 200 range. - on NovoLog pre-meals, also sliding scale insulin -Will be discharged on insulin. RN to teach insulin injection -a1c pending - on a low-carb diet  -Diabetes  coordinator and education prior to discharge.  2. Acute renal failure-known history of CKD, baseline creatinine of 1.7. Has CKD stage III. -pre renal causes and ATN on presentation. Now improved renal function. -Started  losartan with his proteinuria   3. Hypertension-on Norvasc and losartan  4. HIV- follows with ID specialist. On HAART Known history of HIV dementia, HIV encephalopathy with psychomotor slowing. Evaluated by neurology about a year ago for lower extremity weakness. -Continue outpatient follow-up with ID  5. Hypokalemia-being replaced aggressively. Recheck later today  6. DVT prophylaxis on subcutaneous heparin  Patient is from a group home. Wheelchair bound at baseline If potassium levels are improved, will be discharged today  All the records are reviewed and case discussed with Care Management/Social Workerr. Management plans discussed with the patient, family and they are in agreement.  CODE STATUS: Full code  TOTAL TIME TAKING CARE OF THIS PATIENT: 37 minutes.   POSSIBLE D/C today or tomorrow, DEPENDING ON CLINICAL CONDITION.   Enid Baas M.D on 04/01/2017 at 11:45 AM  Between 7am to 6pm - Pager - [phone]  After 6pm go to [email or web address] - Social research officer, government  [facility]  Office  [phone]  CC: Primary care physician; Mick Sell, MD

## 2017-04-01 NOTE — Evaluation (Signed)
Physical Therapy Evaluation Patient Details Name: Jay Flores A Eichhorst MRN: [id number] DOB: 01/06/1961 Today's Date: 04/01/2017   History of Present Illness  Pt is a 57 year old male with PMHx significant for HIV on HAART, hepatitis C, HTN, CKD III presents to hospital secondary to weakness and nausea and noted to have new onset diabetes with ketoacidosis.   Clinical Impression  Prior to hospital admission, pt reports being most recently modified independent w/c level functional mobility (was able to ambulate about 1 month prior with RW).  Pt lives at a group home.  Currently pt is modified independent with bed mobility and CGA with squat/stand pivot transfers bed to/from recliner.  Pt min assist to march in place with RW (pt with increased difficulty attempting to clear/advance R LE from floor compared to L LE).  Overall pt demonstrating LE weakness, standing balance impairments, and difficulty with ambulation.  Pt would benefit from skilled PT to address noted impairments and functional limitations (see below for any additional details).  Upon hospital discharge, recommend pt discharge with HHPT (SW notified).    Follow Up Recommendations Home health PT    Equipment Recommendations  Rolling walker with 5" wheels;Wheelchair (measurements PT);Wheelchair cushion (measurements PT)    Recommendations for Other Services       Precautions / Restrictions Precautions Precautions: Fall Restrictions Weight Bearing Restrictions: No      Mobility  Bed Mobility Overal bed mobility: Modified Independent Bed Mobility: Rolling;Supine to Sit;Sit to Supine Rolling: Modified independent (Device/Increase time)   Supine to sit: Modified independent (Device/Increase time) Sit to supine: Modified independent (Device/Increase time)   General bed mobility comments: mild increased effort to perform but no physical assist required; able to scoot up in bed on his own  Transfers Overall transfer level: Needs  assistance Equipment used: Rolling walker (2 wheeled);None Transfers: Sit to/from Abbott LaboratoriesStand;Squat Pivot Transfers Sit to Stand: Min guard   Squat pivot transfers: Min guard     General transfer comment: increased effort and time to stand with RW; decreased control descent sitting down from RW; stand/squat pivot bed to/from recliner CGA (pt steady and safe with transfers but increased effort noted going back from recliner to bed)  Ambulation/Gait Ambulation/Gait assistance: Min assist Ambulation Distance (Feet): (pt marched in place) Assistive device: Rolling walker (2 wheeled)   Gait velocity: decreased   General Gait Details: pt with difficulty advancing/clearing R LE from floor; able to clear L LE from floor; marched in place x5 reps B LE's; limited d/t weakness/fatigue  Stairs            Wheelchair Mobility    Modified Rankin (Stroke Patients Only)       Balance Overall balance assessment: Needs assistance Sitting-balance support: No upper extremity supported;Feet supported Sitting balance-Leahy Scale: Normal Sitting balance - Comments: steady sitting reaching outside BOS   Standing balance support: Bilateral upper extremity supported Standing balance-Leahy Scale: Poor Standing balance comment: requires B UE support for static standing balance                             Pertinent Vitals/Pain Pain Assessment: No/denies pain  Vitals (HR and O2 on room air) stable and WFL throughout treatment session.    Home Living Family/patient expects to be discharged to:: Group home                 Additional Comments: Ramp to enter 1 level group home    Prior Function Level of Independence:  Independent with assistive device(s)         Comments: Pt reports modified indep with RW for mobility until approx 1 mo. ago when "virus took my balance" and has been in a w/c since, indep with w/c transfers and indep with basic ADL tasks. Group home manages  cooking/cleaning/med mgt/transportation. Pt endorses no falls, low literacy and difficulty with reading.  Pt reports no recent h/o falls.     Hand Dominance   Dominant Hand: Left    Extremity/Trunk Assessment   Upper Extremity Assessment Upper Extremity Assessment: Overall WFL for tasks assessed    Lower Extremity Assessment Lower Extremity Assessment: RLE deficits/detail;LLE deficits/detail RLE Deficits / Details: hip flexion 3+/5; knee extension 4/5; knee flexion 3+/5; DF 3/5 LLE Deficits / Details: hip flexion 3+/5; knee extension 4/5; knee flexion 4-/5; DF 3/5 LLE Sensation: history of peripheral neuropathy    Cervical / Trunk Assessment Cervical / Trunk Assessment: Normal  Communication   Communication: No difficulties  Cognition Arousal/Alertness: Awake/alert Behavior During Therapy: WFL for tasks assessed/performed Overall Cognitive Status: Within Functional Limits for tasks assessed                                        General Comments General comments (skin integrity, edema, etc.): Pt sleeping in bed upon PT entry.  Nursing cleared pt for participation in physical therapy.  Pt agreeable to PT session.    Exercises     Assessment/Plan    PT Assessment Patient needs continued PT services  PT Problem List Decreased strength;Decreased activity tolerance;Decreased balance;Decreased mobility;Impaired sensation       PT Treatment Interventions DME instruction;Gait training;Functional mobility training;Therapeutic activities;Therapeutic exercise;Balance training;Patient/family education    PT Goals (Current goals can be found in the Care Plan section)  Acute Rehab PT Goals Patient Stated Goal: get better and go back to group home PT Goal Formulation: With patient Time For Goal Achievement: 04/15/17 Potential to Achieve Goals: Good    Frequency Min 2X/week   Barriers to discharge        Co-evaluation               AM-PAC PT "6  Clicks" Daily Activity  Outcome Measure Difficulty turning over in bed (including adjusting bedclothes, sheets and blankets)?: None Difficulty moving from lying on back to sitting on the side of the bed? : A Little Difficulty sitting down on and standing up from a chair with arms (e.g., wheelchair, bedside commode, etc,.)?: Unable Help needed moving to and from a bed to chair (including a wheelchair)?: A Little Help needed walking in hospital room?: A Lot Help needed climbing 3-5 steps with a railing? : Total 6 Click Score: 14    End of Session Equipment Utilized During Treatment: Gait belt Activity Tolerance: Patient tolerated treatment well Patient left: in bed;with call bell/phone within reach;with bed alarm set Nurse Communication: Mobility status;Precautions PT Visit Diagnosis: Other abnormalities of gait and mobility (R26.89);Muscle weakness (generalized) (M62.81);Difficulty in walking, not elsewhere classified (R26.2)    Time: 1610-9604 PT Time Calculation (min) (ACUTE ONLY): 18 min   Charges:   PT Evaluation $PT Eval Low Complexity: 1 Low PT Treatments $Therapeutic Activity: 8-22 mins   PT G CodesHendricks Limes, PT 04/01/17, 3:04 PM [phone]

## 2017-04-01 NOTE — Progress Notes (Addendum)
Inpatient Diabetes Program Recommendations  AACE/ADA: New Consensus Statement on Inpatient Glycemic Control (2015)  Target Ranges:  Prepandial:   less than 140 mg/dL      Peak postprandial:   less than 180 mg/dL (1-2 hours)      Critically ill patients:  140 - 180 mg/dL   Results for [name], [name] (MRN [id number]) as of 04/01/2017 08:14  Ref. Range 03/31/2017 07:37 03/31/2017 11:57 03/31/2017 16:50 03/31/2017 21:38 04/01/2017 07:36  Glucose-Capillary Latest Ref Range: 65 - 99 mg/dL 412 (H) 339 (H) 314 (H) 245 (H) 269 (H)  Results for Jay, Flores (MRN [id number]) as of 04/01/2017 13:33  Ref. Range 03/30/2017 08:56  Hemoglobin A1C Latest Ref Range: 4.8 - 5.6 % 11.2 (H)   Review of Glycemic Control  Diabetes history: No Outpatient Diabetes medications: NA Current orders for Inpatient glycemic control: Levemir 12 units BID, Novolog 4 units TID with meals for meal coverage, Novolog 0-9 units TID with meals  Inpatient Diabetes Program Recommendations: HgbA1C: A1C still pending.  NOTE: In reviewing chart, noted patient is from [facility]. Patient was initially on an insulin drip with initial glucose of 1112 mg/dl on 03/29/17. Noted Levemir increased and meal coverage ordered to start this morning. Ordered insulin starter kit and RD consult. Will plan to talk with patient today.  Addendum 04/01/17_0 :30-Spoke with patient about new diabetes diagnosis. Patient denies any prior history of DM and reports that his doctor checks his blood work routienely and he has never been told his glucose was elevated.  Discussed A1C results (11.2% on 03/30/17) and explained what an A1C is and informed patient that his current A1C indicates an average glucose of 275 mg/dl over the past 2-3 months. Discussed basic pathophysiology of DM Type 2, basic home care, importance of checking CBGs and maintaining good CBG control to prevent long-term and short-term complications. Reviewed glucose and A1C goals.   Reviewed signs and symptoms of hyperglycemia and hypoglycemia along with treatment for both. Discussed impact of nutrition, exercise, stress, sickness, and medications on diabetes control. Patient has been ordered Living Well with diabetes booklet and an insulin starter kit but has not received them yet.  Patient reports that he can read some. Encouraged patient to have someone at the group home review the book in detail with him. Patient reports that the staff at the group home would be helping him with medications. Discussed insulin and it appears that patient will require basal and meal coverage insulin. Therefore, in order to simplify insulin regimen, would recommend patient be discharged with 70/30 insulin which can be taken BID (with breakfast and supper).  Discussed 70/30 insulin in detail (how to take it, when to take it) and instructed patient he would begin taking it today with supper. Asked patient to check his glucose 4 times per day (before meals and at bedtime) and to keep a log book of glucose readings and insulin taken. Explained how the doctor he follows up with can use the log book to continue to make insulin adjustments if needed. Reviewed and demonstrated how to draw up and administer insulin with vial and syringe. Patient was able to successfully demonstrate how to draw up and administer insulin with vial and syringe with minimal verbal cues. Informed patient that RN will be asking him to self-administer insulin to ensure proper technique and ability to administer self insulin shots. Patient states that he gave himself an insulin injection before lunch.   Patient verbalized understanding of information discussed and he  states that he has no further questions at this time related to diabetes.   RNs to provide ongoing basic DM education at bedside with this patient and engage patient to actively check blood glucose and administer insulin injections.  Finis Bud at [facility] [phone]) and she confirmed that the staff at the facility will be checking patient's glucose and is able to administer insulin to the patient.  Recommend discharging patient on 70/30 15 units BID (will provide a total of 21 units for basal insulin and 9 units for meal coverage per day). Discussed recommendations with Dr. Tressia Miners. At time of discharge, please provide Rx for: glucometer, testing supplies, 70/30 insulin, insulin syringes.  Thanks, Jay Alderman, RN, MSN, CDE Diabetes Coordinator Inpatient Diabetes Program [phone] (Team Pager from 8am to 5pm)

## 2017-04-02 LAB — HEMOGLOBIN A1C
HEMOGLOBIN A1C: 11.2 % — AB (ref 4.8–5.6)
Mean Plasma Glucose: 275 mg/dL

## 2017-05-25 ENCOUNTER — Emergency Department (HOSPITAL_COMMUNITY)
Admission: EM | Admit: 2017-05-25 | Discharge: 2017-05-25 | Disposition: A | Discharge status: Home / Self Care | Payer: Medicaid Other | Attending: Emergency Medicine | Admitting: Emergency Medicine

## 2017-05-25 ENCOUNTER — Other Ambulatory Visit: Payer: Self-pay

## 2017-05-25 ENCOUNTER — Emergency Department (HOSPITAL_COMMUNITY): Payer: Medicaid Other

## 2017-05-25 ENCOUNTER — Encounter (HOSPITAL_COMMUNITY): Payer: Self-pay | Admitting: Emergency Medicine

## 2017-05-25 DIAGNOSIS — F17210 Nicotine dependence, cigarettes, uncomplicated: Secondary | ICD-10-CM | POA: Insufficient documentation

## 2017-05-25 DIAGNOSIS — Z79899 Other long term (current) drug therapy: Secondary | ICD-10-CM | POA: Insufficient documentation

## 2017-05-25 DIAGNOSIS — Z794 Long term (current) use of insulin: Secondary | ICD-10-CM | POA: Diagnosis not present

## 2017-05-25 DIAGNOSIS — N179 Acute kidney failure, unspecified: Secondary | ICD-10-CM | POA: Diagnosis not present

## 2017-05-25 DIAGNOSIS — B20 Human immunodeficiency virus [HIV] disease: Secondary | ICD-10-CM | POA: Insufficient documentation

## 2017-05-25 DIAGNOSIS — N390 Urinary tract infection, site not specified: Secondary | ICD-10-CM | POA: Insufficient documentation

## 2017-05-25 DIAGNOSIS — E119 Type 2 diabetes mellitus without complications: Secondary | ICD-10-CM | POA: Insufficient documentation

## 2017-05-25 DIAGNOSIS — E860 Dehydration: Secondary | ICD-10-CM

## 2017-05-25 DIAGNOSIS — R319 Hematuria, unspecified: Secondary | ICD-10-CM

## 2017-05-25 DIAGNOSIS — R42 Dizziness and giddiness: Secondary | ICD-10-CM | POA: Diagnosis present

## 2017-05-25 DIAGNOSIS — R531 Weakness: Secondary | ICD-10-CM | POA: Diagnosis not present

## 2017-05-25 DIAGNOSIS — I10 Essential (primary) hypertension: Secondary | ICD-10-CM | POA: Insufficient documentation

## 2017-05-25 HISTORY — DX: Essential (primary) hypertension: I10

## 2017-05-25 HISTORY — DX: Type 2 diabetes mellitus without complications: E11.9

## 2017-05-25 LAB — CBC WITH DIFFERENTIAL/PLATELET
BASOS PCT: 0 %
Basophils Absolute: 0 10*3/uL (ref 0.0–0.1)
EOS ABS: 0.1 10*3/uL (ref 0.0–0.7)
Eosinophils Relative: 2 %
HEMATOCRIT: 33.5 % — AB (ref 39.0–52.0)
HEMOGLOBIN: 11 g/dL — AB (ref 13.0–17.0)
LYMPHS ABS: 1.6 10*3/uL (ref 0.7–4.0)
Lymphocytes Relative: 26 %
MCH: 31.3 pg (ref 26.0–34.0)
MCHC: 32.8 g/dL (ref 30.0–36.0)
MCV: 95.2 fL (ref 78.0–100.0)
MONOS PCT: 3 %
Monocytes Absolute: 0.2 10*3/uL (ref 0.1–1.0)
NEUTROS ABS: 4.2 10*3/uL (ref 1.7–7.7)
NEUTROS PCT: 69 %
Platelets: 167 10*3/uL (ref 150–400)
RBC: 3.52 MIL/uL — ABNORMAL LOW (ref 4.22–5.81)
RDW: 13.5 % (ref 11.5–15.5)
WBC: 6.1 10*3/uL (ref 4.0–10.5)

## 2017-05-25 LAB — URINALYSIS, ROUTINE W REFLEX MICROSCOPIC
Bilirubin Urine: NEGATIVE
Glucose, UA: >=500 mg/dL — AB
Ketones, ur: NEGATIVE mg/dL
Nitrite: NEGATIVE
Protein, ur: >=300 mg/dL — AB
SPECIFIC GRAVITY, URINE: 1.01 (ref 1.005–1.030)
pH: 5 (ref 5.0–8.0)

## 2017-05-25 LAB — COMPREHENSIVE METABOLIC PANEL
ALBUMIN: 3 g/dL — AB (ref 3.5–5.0)
ALK PHOS: 126 U/L (ref 38–126)
ALT: 21 U/L (ref 17–63)
AST: 21 U/L (ref 15–41)
Anion gap: 8 (ref 5–15)
BUN: 23 mg/dL — ABNORMAL HIGH (ref 6–20)
CALCIUM: 9 mg/dL (ref 8.9–10.3)
CO2: 18 mmol/L — AB (ref 22–32)
CREATININE: 1.99 mg/dL — AB (ref 0.61–1.24)
Chloride: 114 mmol/L — ABNORMAL HIGH (ref 101–111)
GFR calc Af Amer: 41 mL/min — ABNORMAL LOW (ref >60)
GFR calc non Af Amer: 36 mL/min — ABNORMAL LOW (ref >60)
Glucose, Bld: 152 mg/dL — ABNORMAL HIGH (ref 65–99)
Potassium: 4.3 mmol/L (ref 3.5–5.1)
SODIUM: 140 mmol/L (ref 135–145)
TOTAL PROTEIN: 7.3 g/dL (ref 6.5–8.1)
Total Bilirubin: 0.4 mg/dL (ref 0.3–1.2)

## 2017-05-25 LAB — RAPID URINE DRUG SCREEN, HOSP PERFORMED
Amphetamines: NOT DETECTED
BENZODIAZEPINES: NOT DETECTED
Barbiturates: NOT DETECTED
COCAINE: NOT DETECTED
OPIATES: NOT DETECTED
TETRAHYDROCANNABINOL: NOT DETECTED

## 2017-05-25 LAB — ETHANOL: Alcohol, Ethyl (B): <10 mg/dL (ref <10)

## 2017-05-25 LAB — CBG MONITORING, ED: Glucose-Capillary: 142 mg/dL — ABNORMAL HIGH (ref 65–99)

## 2017-05-25 MED ORDER — CEPHALEXIN 500 MG PO CAPS
500.0000 mg | ORAL_CAPSULE | Freq: Once | ORAL | Status: AC
  Administered 2017-05-25: 500 mg via ORAL
  Filled 2017-05-25: qty 1

## 2017-05-25 MED ORDER — LACTATED RINGERS IV BOLUS
1000.0000 mL | Freq: Once | INTRAVENOUS | Status: AC
  Administered 2017-05-25: 1000 mL via INTRAVENOUS

## 2017-05-25 MED ORDER — LACTATED RINGERS IV BOLUS
1000.0000 mL | Freq: Once | INTRAVENOUS | Status: AC
Start: 2017-05-25 — End: 2017-05-25
  Administered 2017-05-25: 1000 mL via INTRAVENOUS

## 2017-05-25 MED ORDER — CEPHALEXIN 500 MG PO CAPS: 500.0000 mg | ORAL_CAPSULE | Freq: Four times a day (QID) | ORAL | 0 refills | Status: DC

## 2017-05-25 NOTE — ED Provider Notes (Signed)
[facility] EMERGENCY DEPARTMENT Provider Note   CSN: [id number] Arrival date & time: 05/25/17  1527     History   Chief Complaint Chief Complaint  Patient presents with  . Dizziness    HPI Jay Flores is a 57 y.o. male.   Dizziness  Associated symptoms: weakness   Associated symptoms: no shortness of breath, no vision changes and no vomiting   Weakness  Primary symptoms include dizziness.  Primary symptoms include no focal weakness, no speech change, no memory loss, no visual change. This is a new problem. The current episode started 1 to 2 hours ago. The problem has been gradually improving. There was no focality noted. There has been no fever. Associated symptoms include altered mental status (one episode earlier today). Pertinent negatives include no shortness of breath and no vomiting. Associated medical issues do not include trauma, seizures or dementia.    Past Medical History:  Diagnosis Date  . Acute renal failure ([place])   . Altered mental status 01/20/2012  . Diabetes mellitus without complication ([place])   . HIV (human immunodeficiency virus infection) ([place])   . Hypertension     Patient Active Problem List   Diagnosis Date Noted  . DKA (diabetic ketoacidoses) ([place]) 03/29/2017  . Metabolic acidosis 90/22/8406  . Hypokalemia 01/23/2012  . PNA (pneumonia) 01/22/2012  . HIV (human immunodeficiency virus infection) ([place]) 01/22/2012  . Brain lesion 01/22/2012  . Acute renal failure ([place]) 01/22/2012  . Anemia 01/22/2012  . Altered mental status 01/22/2012    Past Surgical History:  Procedure Laterality Date  . NO PAST SURGERIES          Home Medications    Prior to Admission medications   Medication Sig Start Date End Date Taking? Authorizing Provider  abacavir-dolutegravir-lamiVUDine (TRIUMEQ) 600-50-300 MG tablet Take 1 tablet by mouth daily. 04/01/17  Yes Gladstone Lighter, MD  blood glucose meter kit and supplies KIT Dispense based on patient  and insurance preference. Use up to two to four times daily as directed. (FOR ICD-9 250.00, 250.01). Fasting sugars and evening sugars 04/01/17  Yes Gladstone Lighter, MD  insulin aspart protamine- aspart (NOVOLOG MIX 70/30) (70-30) 100 UNIT/ML injection Inject 0.15 mLs (15 Units total) into the skin 2 (two) times daily with a meal. 04/01/17  Yes Gladstone Lighter, MD  Insulin Syringe-Needle U-100 (BD INSULIN SYRINGE ULTRAFINE) 31G X 15/64" 0.5 ML MISC 100 Devices by Does not apply route 2 (two) times daily. Use to inject insulin as directed 04/01/17  Yes Gladstone Lighter, MD  losartan (COZAAR) 50 MG tablet Take 1 tablet (50 mg total) by mouth daily. 04/02/17  Yes Gladstone Lighter, MD  Multiple Vitamin (MULTIVITAMIN WITH MINERALS) TABS tablet Take 1 tablet by mouth daily. 04/01/17  Yes Gladstone Lighter, MD  cephALEXin (KEFLEX) 500 MG capsule Take 1 capsule (500 mg total) by mouth 4 (four) times daily. 05/25/17   Adelae Yodice, Corene Cornea, MD    Family History Family History  Problem Relation Age of Onset  . Diabetes Mellitus II Mother     Social History Social History   Tobacco Use  . Smoking status: Current Every Day Smoker    Packs/day: 1.00    Years: 34.00    Pack years: 34.00    Types: Cigarettes  . Smokeless tobacco: Never Used  Substance Use Topics  . Alcohol use: No  . Drug use: No    Types: Cocaine    Comment: 01/22/2012 "last used cocaine ~ 2012"     Allergies  Patient has no known allergies.   Review of Systems Review of Systems  Respiratory: Negative for shortness of breath.   Gastrointestinal: Negative for vomiting.  Neurological: Positive for dizziness and weakness. Negative for speech change and focal weakness.  Psychiatric/Behavioral: Negative for memory loss.  All other systems reviewed and are negative.    Physical Exam Updated Vital Signs BP 123/80 (BP Location: Left Arm)   Pulse (!) 55   Temp (!) 97.5 F (36.4 C) (Axillary)   Resp 16   Ht '5\' 8"'  (1.727  m)   SpO2 99%   BMI 21.59 kg/m   Physical Exam  Constitutional: He is oriented to person, place, and time. He appears well-developed and well-nourished.  HENT:  Head: Normocephalic and atraumatic.  Eyes: Conjunctivae and EOM are normal.  Neck: Normal range of motion.  Cardiovascular: Normal rate.  Pulmonary/Chest: Effort normal. No respiratory distress.  Abdominal: Soft. He exhibits no distension.  Musculoskeletal: Normal range of motion. He exhibits no edema or deformity.  Neurological: He is alert and oriented to person, place, and time. No cranial nerve deficit. Coordination normal.  Unable to move lower extremities off of bed to gravity, but doesn't seem to be giving much effort. Upper extremities with normal strength and sensation  Skin: Skin is warm and dry.  Nursing note and vitals reviewed.    ED Treatments / Results  Labs (all labs ordered are listed, but only abnormal results are displayed) Labs Reviewed  CBC WITH DIFFERENTIAL/PLATELET - Abnormal; Notable for the following components:      Result Value   RBC 3.52 (*)    Hemoglobin 11.0 (*)    HCT 33.5 (*)    All other components within normal limits  COMPREHENSIVE METABOLIC PANEL - Abnormal; Notable for the following components:   Chloride 114 (*)    CO2 18 (*)    Glucose, Bld 152 (*)    BUN 23 (*)    Creatinine, Ser 1.99 (*)    Albumin 3.0 (*)    GFR calc non Af Amer 36 (*)    GFR calc Af Amer 41 (*)    All other components within normal limits  URINALYSIS, ROUTINE W REFLEX MICROSCOPIC - Abnormal; Notable for the following components:   APPearance CLOUDY (*)    Glucose, UA >=500 (*)    Hgb urine dipstick SMALL (*)    Protein, ur >=300 (*)    Leukocytes, UA LARGE (*)    Bacteria, UA MANY (*)    Squamous Epithelial / LPF 0-5 (*)    All other components within normal limits  CBG MONITORING, ED - Abnormal; Notable for the following components:   Glucose-Capillary 142 (*)    All other components within  normal limits  RAPID URINE DRUG SCREEN, HOSP PERFORMED  ETHANOL    EKG None  Radiology Ct Head Wo Contrast  Result Date: 05/25/2017 CLINICAL DATA:  Muscle weakness EXAM: CT HEAD WITHOUT CONTRAST TECHNIQUE: Contiguous axial images were obtained from the base of the skull through the vertex without intravenous contrast. COMPARISON:  MRI 01/03/2016 FINDINGS: Brain: There is atrophy and chronic small vessel disease changes. No acute intracranial abnormality. Specifically, no hemorrhage, hydrocephalus, mass lesion, acute infarction, or significant intracranial injury. Vascular: No hyperdense vessel or unexpected calcification. Skull: No acute calvarial abnormality. Sinuses/Orbits: Visualized paranasal sinuses and mastoids clear. Orbital soft tissues unremarkable. Other: None IMPRESSION: No acute intracranial abnormality. Atrophy, chronic microvascular disease. Electronically Signed   By: Rolm Baptise M.D.   On: 05/25/2017 16:45  Procedures Procedures (including critical care time)  Medications Ordered in ED Medications  lactated ringers bolus 1,000 mL (0 mLs Intravenous Stopped 05/25/17 1657)  lactated ringers bolus 1,000 mL (0 mLs Intravenous Stopped 05/25/17 1829)  cephALEXin (KEFLEX) capsule 500 mg (500 mg Oral Given 05/25/17 2018)     Initial Impression / Assessment and Plan / ED Course  I have reviewed the triage vital signs and the nursing notes.  Pertinent labs & imaging results that were available during my care of the patient were reviewed by me and considered in my medical decision making (see chart for details).  Intoxication vs metabolic causes for symptoms? Will ct/labs/fluids (slight hypotension).   BP significantly improved. Improved symptoms. UTI present, will treat. Stable for discharge.   Final Clinical Impressions(s) / ED Diagnoses   Final diagnoses:  Weakness  Urinary tract infection with hematuria, site unspecified  AKI (acute kidney injury) ([place])  Dehydration      ED Discharge Orders        Ordered    cephALEXin (KEFLEX) 500 MG capsule  4 times daily     05/25/17 1944       Sadey Yandell, Corene Cornea, MD 05/25/17 2046

## 2017-05-25 NOTE — ED Notes (Signed)
Patient ready to be discharged, awaiting family.

## 2017-05-25 NOTE — ED Notes (Signed)
Pt unable to urinate at this time.  

## 2017-05-25 NOTE — ED Triage Notes (Signed)
Per family member, pt has been drinking today.  Pt is a diabetic and c/o of dizziness and unable to ambulate.

## 2017-05-26 ENCOUNTER — Other Ambulatory Visit: Payer: Self-pay

## 2017-05-30 ENCOUNTER — Ambulatory Visit: Payer: Medicaid Other | Admitting: Podiatry

## 2017-05-30 ENCOUNTER — Encounter: Payer: Self-pay | Admitting: Podiatry

## 2017-05-30 DIAGNOSIS — M79676 Pain in unspecified toe(s): Secondary | ICD-10-CM | POA: Diagnosis not present

## 2017-05-30 DIAGNOSIS — L03032 Cellulitis of left toe: Secondary | ICD-10-CM

## 2017-05-30 DIAGNOSIS — B351 Tinea unguium: Secondary | ICD-10-CM | POA: Diagnosis not present

## 2017-05-30 DIAGNOSIS — M79674 Pain in right toe(s): Principal | ICD-10-CM

## 2017-05-30 DIAGNOSIS — M79675 Pain in left toe(s): Principal | ICD-10-CM

## 2017-05-30 NOTE — Progress Notes (Signed)
Complaint:  Visit Type: Patient returns to my office for continued preventative foot care services. Complaint: Patient states" my nails have grown long and thick and become painful to walk and wear shoes"  The patient presents for preventative foot care services. No changes to ROS.   Podiatric Exam: Vascular: dorsalis pedis and posterior tibial pulses are palpable bilateral. Capillary return is immediate. Temperature gradient is WNL. Skin turgor WNL  Sensorium: Normal Semmes Weinstein monofilament test. Normal tactile sensation bilaterally. Nail Exam: Pt has thick disfigured discolored nails with subungual debris noted bilateral entire nail hallux through fifth toenails.   Ulcer Exam: There is no evidence of ulcer or pre-ulcerative changes or infection. Orthopedic Exam: Muscle tone and strength are WNL. No limitations in general ROM. No crepitus or effusions noted. Foot type and digits show no abnormalities. Bony prominences are unremarkable. Skin:   Asymptomatic porokeratosis sub 5  B/L No infection or ulcers  Diagnosis:  Onychomycosis, , Pain in right toe, pain in left toes,  Paronychia left hallux.  Treatment & Plan Procedures and Treatment: Consent by patient was obtained for treatment procedures. The patient understood the discussion of treatment and procedures well. All questions were answered thoroughly reviewed. Debridement of mycotic and hypertrophic toenails, 1 through 5 bilateral and clearing of subungual debris. No ulceration, no infection noted. Upon debridement of the toenail on the left hallux. It was noted to have a dried abscess subungually.  The toenail was removed and the dried abscess was cleaned.  The nailbed was then bandaged with Neosporin and a dry sterile dressing.  He is to return to the office in 10 weeks for further evaluation and treatment.  Home soaking instructions were given. Return Visit-Office Procedure: Patient instructed to return to the office for a follow up  visit 10 weeks for continued evaluation and treatment.    Helane GuntherGregory Navy Rothschild DPM

## 2017-08-29 ENCOUNTER — Ambulatory Visit: Payer: Medicaid Other | Admitting: Podiatry

## 2017-09-02 ENCOUNTER — Ambulatory Visit: Payer: Medicaid Other | Admitting: Podiatry

## 2017-09-19 ENCOUNTER — Encounter: Payer: Self-pay | Admitting: Podiatry

## 2017-09-19 ENCOUNTER — Ambulatory Visit (INDEPENDENT_AMBULATORY_CARE_PROVIDER_SITE_OTHER): Payer: Medicaid Other | Admitting: Podiatry

## 2017-09-19 DIAGNOSIS — B351 Tinea unguium: Secondary | ICD-10-CM

## 2017-09-19 DIAGNOSIS — M79675 Pain in left toe(s): Secondary | ICD-10-CM | POA: Diagnosis not present

## 2017-09-19 DIAGNOSIS — M79674 Pain in right toe(s): Secondary | ICD-10-CM

## 2017-09-19 DIAGNOSIS — E119 Type 2 diabetes mellitus without complications: Secondary | ICD-10-CM

## 2017-09-19 NOTE — Progress Notes (Addendum)
Complaint:  Visit Type: Patient returns to my office for continued preventative foot care services. Complaint: Patient states" my nails have grown long and thick and become painful to walk and wear shoes"  The patient presents for preventative foot care services. No changes to ROS.   Podiatric Exam: Vascular: dorsalis pedis and posterior tibial pulses are palpable bilateral. Capillary return is immediate. Temperature gradient is WNL. Skin turgor WNL  Sensorium: Normal Semmes Weinstein monofilament test. Normal tactile sensation bilaterally. Nail Exam: Pt has thick disfigured discolored nails with subungual debris noted bilateral entire nail hallux through fifth toenails.   Ulcer Exam: There is no evidence of ulcer or pre-ulcerative changes or infection. Orthopedic Exam: Muscle tone and strength are WNL. No limitations in general ROM. No crepitus or effusions noted. Foot type and digits show no abnormalities. Bony prominences are unremarkable. Skin:   Asymptomatic porokeratosis sub 5  B/L No infection or ulcers  Diagnosis:  Onychomycosis, , Pain in right toe, pain in left toes   Treatment & Plan Procedures and Treatment: Consent by patient was obtained for treatment procedures. The patient understood the discussion of treatment and procedures well. All questions were answered thoroughly reviewed. Debridement of mycotic and hypertrophic toenails, 1 through 5 bilateral and clearing of subungual debris. No ulceration, ni infection.  ABN signed for 2019.  Return Visit-Office Procedure: Patient instructed to return to the office for a follow up visit 10 weeks for continued evaluation and treatment.    Helane GuntherGregory Rikita Grabert DPM

## 2017-12-26 ENCOUNTER — Encounter: Payer: Self-pay | Admitting: Podiatry

## 2017-12-26 ENCOUNTER — Ambulatory Visit: Payer: Medicaid Other | Admitting: Podiatry

## 2017-12-26 DIAGNOSIS — E119 Type 2 diabetes mellitus without complications: Secondary | ICD-10-CM | POA: Diagnosis not present

## 2017-12-26 DIAGNOSIS — B351 Tinea unguium: Secondary | ICD-10-CM

## 2017-12-26 DIAGNOSIS — Q828 Other specified congenital malformations of skin: Secondary | ICD-10-CM | POA: Diagnosis not present

## 2017-12-26 DIAGNOSIS — M79675 Pain in left toe(s): Secondary | ICD-10-CM | POA: Diagnosis not present

## 2017-12-26 DIAGNOSIS — M79674 Pain in right toe(s): Secondary | ICD-10-CM

## 2017-12-26 NOTE — Progress Notes (Signed)
Complaint:  Visit Type: Patient returns to my office for continued preventative foot care services. Complaint: Patient states" my nails have grown long and thick and become painful to walk and wear shoes"  The patient presents for preventative foot care services. No changes to ROS.   Podiatric Exam: Vascular: dorsalis pedis and posterior tibial pulses are palpable bilateral. Capillary return is immediate. Temperature gradient is WNL. Skin turgor WNL  Sensorium: Normal Semmes Weinstein monofilament test. Normal tactile sensation bilaterally. Nail Exam: Pt has thick disfigured discolored nails with subungual debris noted bilateral entire nail hallux through fifth toenails.   Ulcer Exam: There is no evidence of ulcer or pre-ulcerative changes or infection. Orthopedic Exam: Muscle tone and strength are WNL. No limitations in general ROM. No crepitus or effusions noted. Foot type and digits show no abnormalities. Bony prominences are unremarkable. Skin:  porokeratosis sub 5  B/L No infection or ulcers  Diagnosis:  Onychomycosis, , Pain in right toe, pain in left toes Debride porokeratosis sub 5th  B/L  Treatment & Plan Procedures and Treatment: Consent by patient was obtained for treatment procedures. The patient understood the discussion of treatment and procedures well. All questions were answered thoroughly reviewed. Debridement of mycotic and hypertrophic toenails, 1 through 5 bilateral and clearing of subungual debris. No ulceration, ni infection.  ABN signed for 2019.  Return Visit-Office Procedure: Patient instructed to return to the office for a follow up visit 10 weeks for continued evaluation and treatment.    Helane GuntherGregory Kennedi Lizardo DPM

## 2018-03-31 ENCOUNTER — Telehealth: Payer: Self-pay | Admitting: *Deleted

## 2018-03-31 DIAGNOSIS — Z122 Encounter for screening for malignant neoplasm of respiratory organs: Secondary | ICD-10-CM

## 2018-03-31 DIAGNOSIS — Z87891 Personal history of nicotine dependence: Secondary | ICD-10-CM

## 2018-03-31 NOTE — Telephone Encounter (Signed)
Received referral for low dose lung cancer screening CT scan. Message left at phone number listed in EMR for patient to call me back to facilitate scheduling scan.  

## 2018-03-31 NOTE — Telephone Encounter (Signed)
Received referral for initial lung cancer screening scan. Contacted patient and obtained smoking history,(current, 34 pack year) as well as answering questions related to screening process. Patient denies signs of lung cancer such as weight loss or hemoptysis. Patient denies comorbidity that would prevent curative treatment if lung cancer were found. Patient is scheduled for shared decision making visit and CT scan on 04/17/18 at 2pm.

## 2018-04-03 ENCOUNTER — Ambulatory Visit: Payer: Medicaid Other | Admitting: Podiatry

## 2018-04-17 ENCOUNTER — Ambulatory Visit
Admission: RE | Admit: 2018-04-17 | Discharge: 2018-04-17 | Disposition: A | Discharge status: Home / Self Care | Payer: Medicaid Other | Source: Ambulatory Visit | Attending: Nurse Practitioner | Admitting: Nurse Practitioner

## 2018-04-17 ENCOUNTER — Other Ambulatory Visit: Payer: Self-pay

## 2018-04-17 ENCOUNTER — Inpatient Hospital Stay: Payer: Medicaid Other | Attending: Nurse Practitioner | Admitting: Nurse Practitioner

## 2018-04-17 DIAGNOSIS — Z87891 Personal history of nicotine dependence: Secondary | ICD-10-CM

## 2018-04-17 DIAGNOSIS — Z122 Encounter for screening for malignant neoplasm of respiratory organs: Secondary | ICD-10-CM | POA: Diagnosis not present

## 2018-04-17 DIAGNOSIS — F17210 Nicotine dependence, cigarettes, uncomplicated: Secondary | ICD-10-CM

## 2018-04-17 DIAGNOSIS — Z720 Tobacco use: Secondary | ICD-10-CM | POA: Insufficient documentation

## 2018-04-17 NOTE — Progress Notes (Signed)
In accordance with CMS guidelines, patient has met eligibility criteria including age, absence of signs or symptoms of lung cancer.  Social History   Tobacco Use  . Smoking status: Current Every Day Smoker    Packs/day: 1.00    Years: 34.00    Pack years: 34.00    Types: Cigarettes  . Smokeless tobacco: Never Used  Substance Use Topics  . Alcohol use: No  . Drug use: No    Types: Cocaine    Comment: 01/22/2012 "last used cocaine ~ 2012"      A shared decision-making session was conducted prior to the performance of CT scan. This includes one or more decision aids, includes benefits and harms of screening, follow-up diagnostic testing, over-diagnosis, false positive rate, and total radiation exposure.   Counseling on the importance of adherence to annual lung cancer LDCT screening, impact of co-morbidities, and ability or willingness to undergo diagnosis and treatment is imperative for compliance of the program.   Counseling on the importance of continued smoking cessation for former smokers; the importance of smoking cessation for current smokers, and information about tobacco cessation interventions have been given to patient including [place] [facility]uit [facility] and 1800 quit [place] programs.   Written order for lung cancer screening with LDCT has been given to the patient and any and all questions have been answered to the best of my abilities.    Yearly follow up will be coordinated by Shawn Perkins, Thoracic Navigator.   , DNP, AGNP-C [facility] [phone] (work cell) [phone] (office) 04/17/18 3:11 PM   

## 2018-04-18 ENCOUNTER — Encounter: Payer: Self-pay | Admitting: *Deleted

## 2018-04-23 ENCOUNTER — Encounter: Payer: Self-pay | Admitting: *Deleted

## 2019-04-09 ENCOUNTER — Telehealth: Payer: Self-pay | Admitting: *Deleted

## 2019-04-09 NOTE — Telephone Encounter (Signed)
(  04/09/19) Couldn't leave message for pt to notify them that it is time to schedule annual low dose lung cancer screening CT scan. Will call back to verify information prior to the scan being scheduled SRW

## 2019-04-15 ENCOUNTER — Telehealth: Payer: Self-pay | Admitting: *Deleted

## 2019-04-15 DIAGNOSIS — Z87891 Personal history of nicotine dependence: Secondary | ICD-10-CM

## 2019-04-15 NOTE — Telephone Encounter (Signed)
Patient's caregiver has been notified that annual lung cancer screening low dose CT scan is due currently or will be in near future. Confirmed that patient is within the age range of 55-77, and asymptomatic, (no signs or symptoms of lung cancer). Patient denies illness that would prevent curative treatment for lung cancer if found. Verified smoking history, (current, 35 pack year). The shared decision making visit was done 04/17/18. Patient is agreeable for CT scan being scheduled.

## 2019-04-20 ENCOUNTER — Other Ambulatory Visit: Payer: Self-pay

## 2019-04-20 ENCOUNTER — Ambulatory Visit
Admission: RE | Admit: 2019-04-20 | Discharge: 2019-04-20 | Disposition: A | Discharge status: Home / Self Care | Payer: Medicaid Other | Source: Ambulatory Visit | Attending: Oncology | Admitting: Oncology

## 2019-04-20 DIAGNOSIS — Z87891 Personal history of nicotine dependence: Secondary | ICD-10-CM | POA: Diagnosis not present

## 2019-04-23 ENCOUNTER — Encounter: Payer: Self-pay | Admitting: *Deleted

## 2020-04-06 ENCOUNTER — Telehealth: Payer: Self-pay | Admitting: *Deleted

## 2020-04-06 NOTE — Telephone Encounter (Signed)
Attempted to contact patient to schedule lung screening. Left message to call Shawn at [phone]. 

## 2020-04-08 ENCOUNTER — Telehealth: Payer: Self-pay | Admitting: Licensed Clinical Social Worker

## 2020-04-08 NOTE — Telephone Encounter (Signed)
I called and left a message with the patient's admin at the group home to call and schedule an appt for lung screening

## 2020-04-11 ENCOUNTER — Telehealth: Payer: Self-pay | Admitting: *Deleted

## 2020-04-11 NOTE — Telephone Encounter (Signed)
I attempted to call the first number and voicemail came on saying you have reached Mliss Sax- so I hung up and called the cell phone number and got voice mail and left message that it is time to get yearly low dose lung screening scan. If you are interested to pleas call [phone] and will need to ask a few questions to make sure you still qualify for scan.

## 2020-09-22 ENCOUNTER — Ambulatory Visit (INDEPENDENT_AMBULATORY_CARE_PROVIDER_SITE_OTHER): Payer: Medicaid Other | Admitting: Urology

## 2020-09-22 ENCOUNTER — Encounter: Payer: Self-pay | Admitting: Urology

## 2020-09-22 ENCOUNTER — Other Ambulatory Visit: Payer: Self-pay

## 2020-09-22 VITALS — BP 96/69 | HR 69 | Ht 73.0 in | Wt 140.0 lb

## 2020-09-22 DIAGNOSIS — R339 Retention of urine, unspecified: Secondary | ICD-10-CM | POA: Diagnosis not present

## 2020-09-22 DIAGNOSIS — N401 Enlarged prostate with lower urinary tract symptoms: Secondary | ICD-10-CM

## 2020-09-22 LAB — BLADDER SCAN AMB NON-IMAGING: Scan Result: 841

## 2020-09-22 NOTE — Progress Notes (Signed)
Simple Catheter Placement  Due to urinary retention patient is present today for a foley cath placement.  Patient was cleaned and prepped in a sterile fashion with betadine. A 16coude  FR foley catheter was inserted, urine return was noted  , urine was Dark Yellow in color.  The balloon was filled with 10cc of sterile water.  A night bag was attached for drainage. Patient was also given a night bag to take home and was given instruction on how to change from one bag to another.  Patient was given instruction on proper catheter care.  Patient tolerated well, no complications were noted   Performed by: Ples Specter CMA   Additional notes/ Follow up: 1 Week

## 2020-09-22 NOTE — Progress Notes (Signed)
09/22/2020 10:20 AM   Jay Flores 18-Apr-1960 [id number]  Referring provider: Terrill Mohr, NP [address],  [place] [address]  Chief Complaint  Patient presents with   Urinary Retention    HPI: Jay Flores is a 60 y.o. male referred for evaluation of urinary retention.  3-4 week history of urinary hesitancy, decreased force and caliber of stream and lower abdominal discomfort Was started on tamsulosin and urology evaluation recommended Past urologic history remarkable for UTI and eval by Dr. Yves Dill in 2010 for hematuria Denies gross hematuria   PMH: Past Medical History:  Diagnosis Date   Acute renal failure ([place])    Altered mental status 01/20/2012   Diabetes mellitus without complication ([facility])    HIV (human immunodeficiency virus infection) ([place])    Hypertension     Surgical History: Past Surgical History:  Procedure Laterality Date   NO PAST SURGERIES      Home Medications:  Allergies as of 09/22/2020   No Known Allergies      Medication List        Accurate as of September 22, 2020 10:20 AM. If you have any questions, ask your nurse or doctor.          abacavir-dolutegravir-lamiVUDine 600-50-300 MG tablet Commonly known as: Triumeq Take 1 tablet by mouth daily.   blood glucose meter kit and supplies Kit Dispense based on patient and insurance preference. Use up to two to four times daily as directed. (FOR ICD-9 250.00, 250.01). Fasting sugars and evening sugars   cephALEXin 500 MG capsule Commonly known as: KEFLEX Take 1 capsule (500 mg total) by mouth 4 (four) times daily.   insulin aspart protamine- aspart (70-30) 100 UNIT/ML injection Commonly known as: NovoLOG Mix 70/30 Inject 0.15 mLs (15 Units total) into the skin 2 (two) times daily with a meal.   Insulin Syringe-Needle U-100 31G X 15/64" 0.5 ML Misc Commonly known as: BD Insulin Syringe Ultrafine 100 Devices by Does not apply route 2 (two) times daily. Use to  inject insulin as directed   losartan 50 MG tablet Commonly known as: COZAAR Take 1 tablet (50 mg total) by mouth daily.   metoprolol succinate 100 MG 24 hr tablet Commonly known as: TOPROL-XL Take by mouth.   multivitamin with minerals Tabs tablet Take 1 tablet by mouth daily.        Allergies: No Known Allergies  Family History: Family History  Problem Relation Age of Onset   Diabetes Mellitus II Mother     Social History:  reports that he has been smoking cigarettes. He has a 34.00 pack-year smoking history. He has never used smokeless tobacco. He reports that he does not drink alcohol and does not use drugs.   Physical Exam: BP 96/69   Pulse 69   Ht '6\' 1"'  (1.854 m)   Wt 140 lb (63.5 kg)   BMI 18.47 kg/m   Constitutional:  Alert and oriented, No acute distress. HEENT: [place] AT, moist mucus membranes.  Trachea midline, no masses. Cardiovascular: No clubbing, cyanosis, or edema. Respiratory: Normal respiratory effort, no increased work of breathing. GU: Prostate 50 g, smooth without nodules    Assessment & Plan:    1. Urinary retention PVR by bladder scan was 841 mL We discussed BPH is the most common cause of urinary retention with bladder hypotonicity being less common Recommended Foley catheter placement to stay indwelling for 7 days and follow-up for catheter removal/voiding trial  2.  BPH with LUTS Continue  tamsulosin  Abbie Sons, MD  [facility] [address] [place], [place] [address] [phone]

## 2020-09-28 ENCOUNTER — Ambulatory Visit: Payer: Medicaid Other

## 2020-09-28 ENCOUNTER — Other Ambulatory Visit: Payer: Self-pay

## 2020-09-28 ENCOUNTER — Ambulatory Visit (INDEPENDENT_AMBULATORY_CARE_PROVIDER_SITE_OTHER): Payer: Medicaid Other

## 2020-09-28 DIAGNOSIS — R339 Retention of urine, unspecified: Secondary | ICD-10-CM | POA: Diagnosis not present

## 2020-09-28 LAB — BLADDER SCAN AMB NON-IMAGING

## 2020-09-28 NOTE — Progress Notes (Addendum)
Catheter Removal  Patient is present today for a catheter removal.  38ml of water was drained from the balloon. A 16FR coude foley cath was removed from the bladder no complications were noted . Patient tolerated well.  Performed by: Franchot Erichsen, CMA  Follow up/ Additional notes: RTC this afternoon for PVR.   Afternoon PVR . Per Michiel Cowboy, patient is to return in one month for repeat PVR. Appt made.

## 2020-09-28 NOTE — Addendum Note (Signed)
Addended by: Lizbeth Bark on: 09/28/2020 01:21 PM   Modules accepted: Orders

## 2020-10-25 NOTE — Progress Notes (Signed)
10/26/2020 5:57 PM   Jay Flores August 02, 1960 [id number]  Referring provider: Sharman Crate, MD [facility] [place],  [place] [address]  Urological history: 1. Urinary retention -contributing factors of age and diabetes -occurred in 09/2020  2. BPH with LU TS -PSA pending -I PSS 16/3 -PVR 742 mL  3. High risk hematuria -smoker -work up in 2010 NED -no reports of gross heme -UA 3-10 RBC's, but grossly infected  Chief Complaint  Patient presents with   Follow-up    58mh follow-up    HPI: Jay CUTBIRTHis a 60y.o. male who presents today for a one month follow up for I PSS and PVR  He states that he has been voiding at home, but his PVR today is greater than 700 cc.  He states his urine has been cloudy.    Patient denies any modifying or aggravating factors.  Patient denies any gross hematuria, dysuria or suprapubic/flank pain.  Patient denies any fevers, chills, nausea or vomiting.    He is taking tamsulosin 0.4 mg daily per his MAR.   UA nitrite positive, >30 WBC's, 3-10 RBC's and many bacteria.    IPSS     Row Name 10/26/20 1300         International Prostate Symptom Score   How often have you had the sensation of not emptying your bladder? Less than half the time     How often have you had to urinate less than every two hours? Less than half the time     How often have you found you stopped and started again several times when you urinated? Less than 1 in 5 times     How often have you found it difficult to postpone urination? Less than half the time     How often have you had a weak urinary stream? About half the time     How often have you had to strain to start urination? About half the time     How many times did you typically get up at night to urinate? 3 Times     Total IPSS Score 16           Quality of Life due to urinary symptoms   If you were to spend the rest of your life with your urinary condition just the way  it is now how would you feel about that? Mixed              Score:  1-7 Mild 8-19 Moderate 20-35 Severe      PMH: Past Medical History:  Diagnosis Date   Acute renal failure (HCC)    Altered mental status 01/20/2012   Diabetes mellitus without complication (HCC)    HIV (human immunodeficiency virus infection) ([facility]    Hypertension     Surgical History: Past Surgical History:  Procedure Laterality Date   NO PAST SURGERIES      Home Medications:  Allergies as of 10/26/2020   No Known Allergies      Medication List        Accurate as of October 26, 2020 11:59 PM. If you have any questions, ask your nurse or doctor.          STOP taking these medications    abacavir-dolutegravir-lamiVUDine 600-50-300 MG tablet Commonly known as: Triumeq Stopped by: Jay Binns, PA-C   blood glucose meter kit and supplies Kit Stopped by: Jay Gottsch, PA-C   cephALEXin 500 MG capsule  Commonly known as: KEFLEX Stopped by: Jay Council, PA-C   Insulin Syringe-Needle U-100 31G X 15/64" 0.5 ML Misc Commonly known as: BD Insulin Syringe Ultrafine Stopped by: Jay Thai, PA-C   multivitamin with minerals Tabs tablet Stopped by: Jay Council, PA-C       TAKE these medications    Accu-Chek Aviva Plus test strip Generic drug: glucose blood 1 each by Other route in the morning, at noon, and at bedtime. Use as instructed   Accu-Chek Softclix Lancets lancets by Other route. Use as instructed   atorvastatin 20 MG tablet Commonly known as: LIPITOR Take 20 mg by mouth daily.   insulin aspart protamine- aspart (70-30) 100 UNIT/ML injection Commonly known as: NOVOLOG MIX 70/30 Inject 14 Units into the skin 2 (two) times daily with a meal. What changed: Another medication with the same name was removed. Continue taking this medication, and follow the directions you see here. Changed by: Jay Thomaston, PA-C   Juluca 50-25 MG Tabs Generic drug:  Dolutegravir-Rilpivirine Take 1 tablet by mouth daily.   loperamide 2 MG capsule Commonly known as: IMODIUM Take 2 mg by mouth in the morning and at bedtime.   losartan 100 MG tablet Commonly known as: COZAAR Take 100 mg by mouth daily. What changed: Another medication with the same name was removed. Continue taking this medication, and follow the directions you see here. Changed by: Jay Council, PA-C   metoprolol succinate 100 MG 24 hr tablet Commonly known as: TOPROL-XL Take 150 mg by mouth daily.   Sodium Bicarbonate-Citric Acid 1940-1000 MG Tbef Take 1 tablet by mouth in the morning and at bedtime.   tamsulosin 0.4 MG Caps capsule Commonly known as: FLOMAX Take 0.4 mg by mouth in the morning and at bedtime.   Vitamin D3 25 MCG (1000 UT) Caps Take 1 capsule by mouth daily.        Allergies: No Known Allergies  Family History: Family History  Problem Relation Age of Onset   Diabetes Mellitus II Mother     Social History:  reports that he has been smoking cigarettes. He has a 34.00 pack-year smoking history. He has never used smokeless tobacco. He reports that he does not drink alcohol and does not use drugs.  ROS: Pertinent ROS in HPI  Physical Exam: BP 109/67   Pulse (!) 54   Ht '6\' 1"'  (1.854 m)   Wt 140 lb (63.5 kg)   BMI 18.47 kg/m   Constitutional:  Well nourished. Alert and oriented, No acute distress. HEENT: [place] AT, mask in place.  Trachea midline Cardiovascular: No clubbing, cyanosis, or edema. Respiratory: Normal respiratory effort, no increased work of breathing. Neurologic: Grossly intact, no focal deficits, moving all 4 extremities. Psychiatric: Normal mood and affect.  Laboratory Data: Sodium 135 - 145 mmol/L 137   Potassium 3.4 - 4.8 mmol/L 3.9   Chloride 98 - 107 mmol/L 113 High    CO2 20.0 - 31.0 mmol/L 17.0 Low    Anion Gap 5 - 14 mmol/L 7   BUN 9 - 23 mg/dL 49 High    Creatinine 0.60 - 1.10 mg/dL 2.65 High    BUN/Creatinine Ratio   18   eGFR CKD-EPI (2021) Male >=60 mL/min/1.6m 27 Low    Comment: eGFR calculated with CKD-EPI 2021 equation in accordance with [employer] Ne[employer] Task Force recommendations.  Glucose 70 - 179 mg/dL 159   Calcium 8.7 - 10.4 mg/dL 9.0   Phosphorus 2.4 - 5.1  mg/dL 3.2   Albumin 3.4 - 5.0 g/dL 3.3 Low    Resulting Agency  [facility]ICAL [facility]ATOR[facility]  Specimen Collected: 09/16/20 08:57 Last Resulted: 09/16/20 17:57  Received From: [facility]  Result Received: 09/22/20 10:03   HGB 12.9 - 16.5 g/dL 12.7 Low    Resulting Agency  [facility] CLINICAL LABORATORIES  Specimen Collected: 09/16/20 08:57 Last Resulted: 09/16/20 17:12  Received From: [facility]  Result Received: 09/22/20 10:03   PTH 18.4 - 80.1 pg/mL 103.3 High    Resulting Agency  [facility] CLINICAL LABORATORIES  Specimen Collected: 09/16/20 08:57 Last Resulted: 09/16/20 17:49  Received From: [facility]  Result Received: 09/22/20 10:03  Urinalysis Component     Latest Ref Rng & Units 10/26/2020  Specific Gravity, UA     1.005 - 1.030 1.020  pH, UA     5.0 - 7.5 6.0  Color, UA     Yellow Yellow  Appearance Ur     Clear Cloudy (A)  Leukocytes,UA     Negative 2+ (A)  Protein,UA     Negative/Trace 3+ (A)  Glucose, UA     Negative Negative  Ketones, UA     Negative Negative  RBC, UA     Negative 2+ (A)  Bilirubin, UA     Negative Negative  Urobilinogen, Ur     0.2 - 1.0 mg/dL 0.2  Nitrite, UA     Negative Positive (A)  Microscopic Examination      See below:   Component     Latest Ref Rng & Units 10/26/2020          WBC, UA     0 - 5 /hpf >30 (A)  RBC     0 - 2 /hpf 3-10 (A)  Epithelial Cells (non renal)     0 - 10 /hpf 0-10  Bacteria, UA     None seen/Few Many (A)  I have reviewed the labs.  Procedure Simple Catheter Placement Due to urinary retention patient is present today for a foley cath placement.  Patient was cleaned and  prepped in a sterile fashion with betadine. A 18 FR Coude foley catheter was inserted, urine return was noted  700 ml, urine was yellow cloudy in color.  The balloon was filled with 10cc of sterile water.  A leg bag was attached for drainage. Patient was also given a night bag to take home and was given instruction on how to change from one bag to another.  Patient was given instruction on proper catheter care.  Patient tolerated well, no complications were noted   Pertinent Imaging: Results for YOUSSEF, FOOTMAN (MRN [id number]) as of 10/26/2020 14:42  Ref. Range 10/26/2020 14:11  Scan Result Unknown 736m    Assessment & Plan:    1. BPH with LUTS -PSA pending -UA grossly infected  -Urine sent for culture  -PVR > 300 cc -Foley placed -continue tamsulosin 0.4 mg daily -Cannot tolerate medication or medication failure, schedule cystoscopy  -I have explained to the patient that they will  be scheduled for a cystoscopy in our office to evaluate their bladder.  The cystoscopy consists of passing a tube with a lens up through their urethra and into their urinary bladder.   We will inject the urethra with a lidocaine gel prior to introducing the cystoscope to help with any discomfort during the procedure.   After the procedure, they might experience blood in the urine and discomfort with urination.  This will abate after the first few voids.  I have  encouraged the patient to increase water intake  during this time.  Patient denies any allergies to lidocaine.    2. Urinary retention -He is an unreliable historian, therefore CIC is not a viable option -Foley catheter is placed at today's visit -He will be scheduled for cystoscopy to be evaluated for BOO   Return for cysto for BOO with Dr. Bernardo Heater .  These notes generated with voice recognition software. I apologize for typographical errors.  Jay Council, PA-C  [facility] [address]  [place],  [place] [address] [phone]

## 2020-10-26 ENCOUNTER — Encounter: Payer: Self-pay | Admitting: Urology

## 2020-10-26 ENCOUNTER — Ambulatory Visit (INDEPENDENT_AMBULATORY_CARE_PROVIDER_SITE_OTHER): Payer: Medicaid Other | Admitting: Urology

## 2020-10-26 ENCOUNTER — Other Ambulatory Visit: Payer: Self-pay

## 2020-10-26 VITALS — BP 109/67 | HR 54 | Ht 73.0 in | Wt 140.0 lb

## 2020-10-26 DIAGNOSIS — N401 Enlarged prostate with lower urinary tract symptoms: Secondary | ICD-10-CM | POA: Diagnosis not present

## 2020-10-26 DIAGNOSIS — R339 Retention of urine, unspecified: Secondary | ICD-10-CM

## 2020-10-26 LAB — BLADDER SCAN AMB NON-IMAGING

## 2020-10-27 LAB — MICROSCOPIC EXAMINATION: WBC, UA: >30 /hpf — AB (ref 0–5)

## 2020-10-27 LAB — URINALYSIS, COMPLETE
Bilirubin, UA: NEGATIVE
Glucose, UA: NEGATIVE
Ketones, UA: NEGATIVE
Nitrite, UA: POSITIVE — AB
Specific Gravity, UA: 1.02 (ref 1.005–1.030)
Urobilinogen, Ur: 0.2 mg/dL (ref 0.2–1.0)
pH, UA: 6 (ref 5.0–7.5)

## 2020-10-27 LAB — PSA: Prostate Specific Ag, Serum: 4.5 ng/mL — ABNORMAL HIGH (ref 0.0–4.0)

## 2020-10-30 LAB — CULTURE, URINE COMPREHENSIVE

## 2020-11-02 ENCOUNTER — Telehealth: Payer: Self-pay

## 2020-11-02 DIAGNOSIS — R339 Retention of urine, unspecified: Secondary | ICD-10-CM

## 2020-11-02 IMAGING — CT CT CHEST LUNG CANCER SCREENING LOW DOSE
2 of 5 series · 15 of 40 positions shown, 18 images · non-contrast
Comparison: 01/22/2012 chest radiograph.

CLINICAL DATA: 57-year-old asymptomatic male current smoker with 34
pack-year smoking history.

EXAM:
CT CHEST WITHOUT CONTRAST LOW-DOSE FOR LUNG CANCER SCREENING
TECHNIQUE: Multidetector CT imaging of the chest was performed following the
standard protocol without IV contrast.

[Series 3: lung · axial · 0.62mm/px · z∈[-1384,-1094]mm · 12 of 322 slices shown, 15 images (1 of 2)]
[im 16/322  mediastinal]
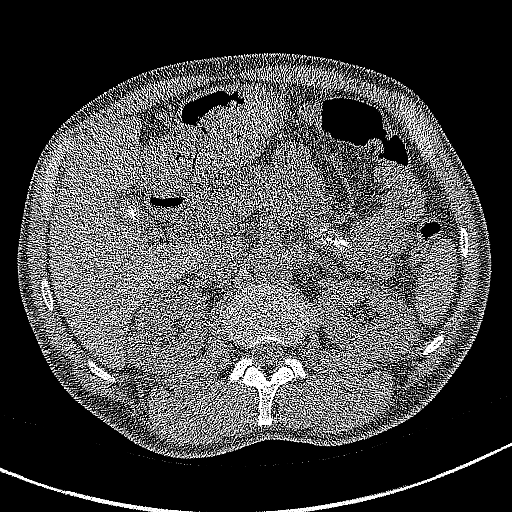
[im 16/322  lung]
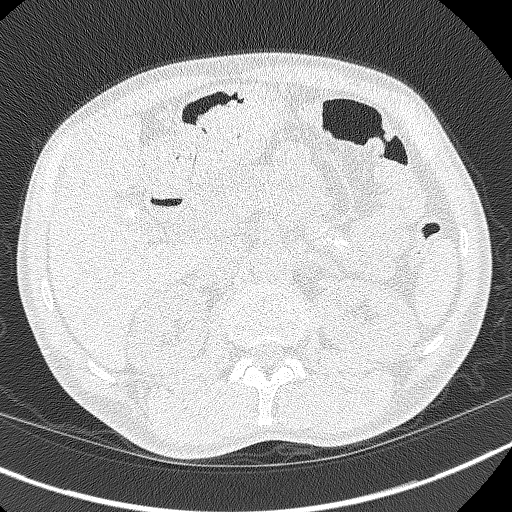
[im 46/322  lung]
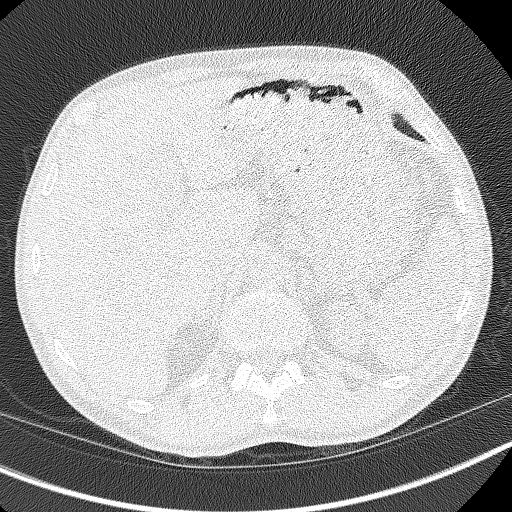
[im 77/322  lung]
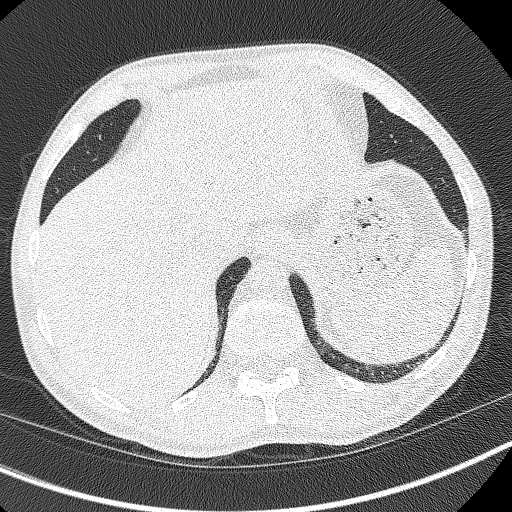
[im 92/322  lung]
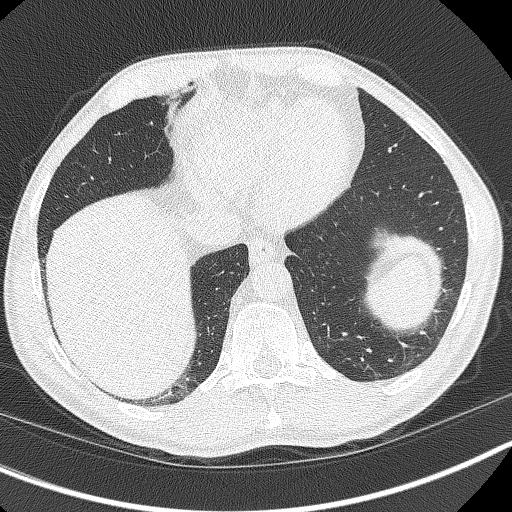
[im 123/322  mediastinal]
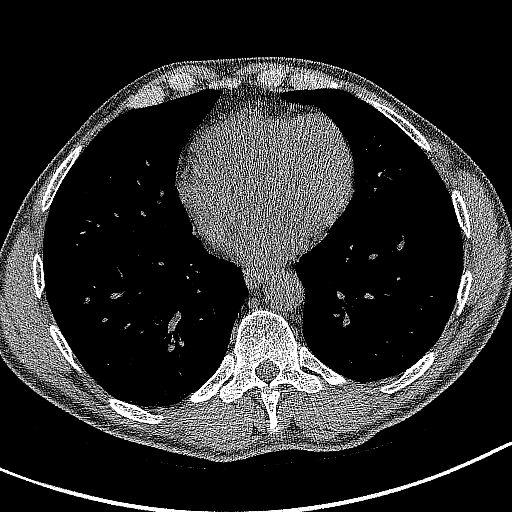
[im 123/322  lung]
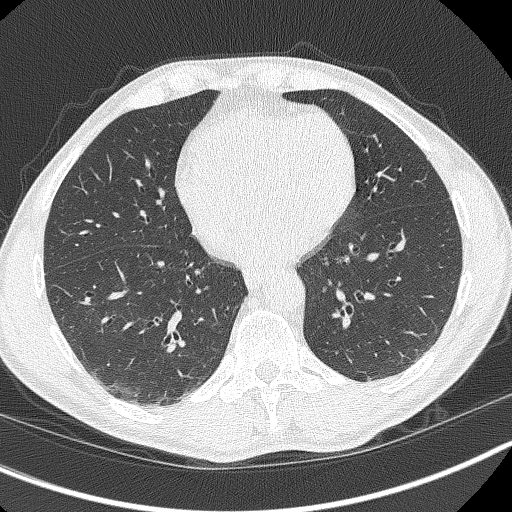
[im 153/322  lung]
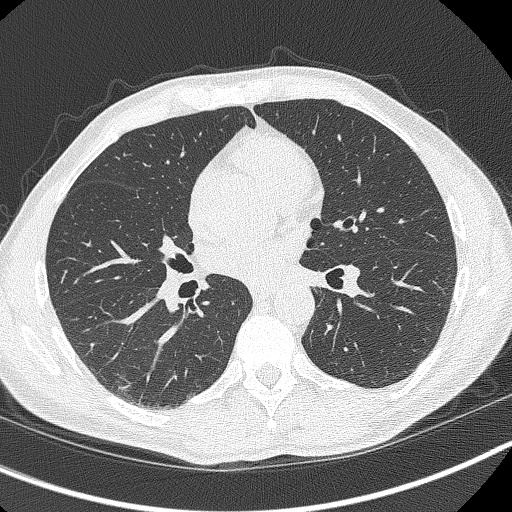
[im 169/322  lung]
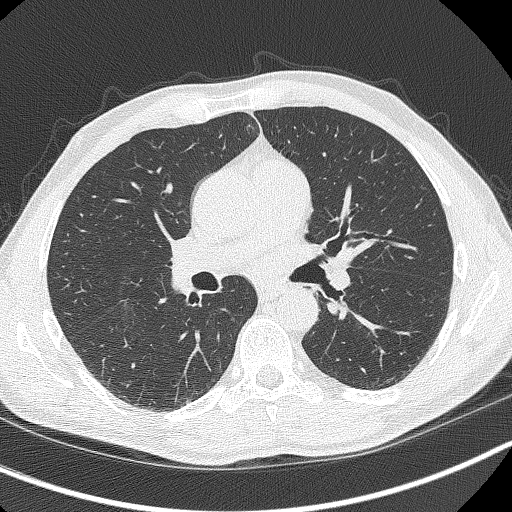
[im 199/322  lung]
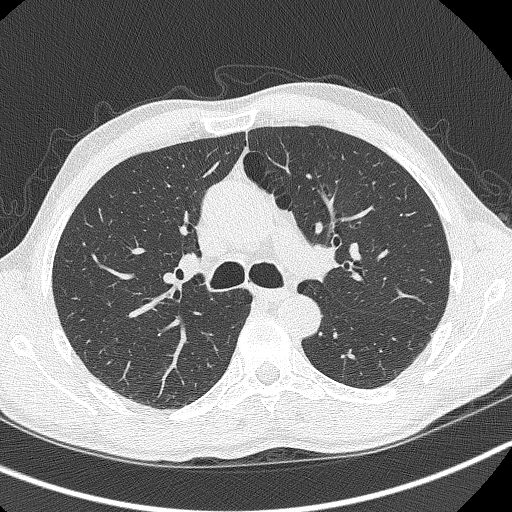
[im 230/322  mediastinal]
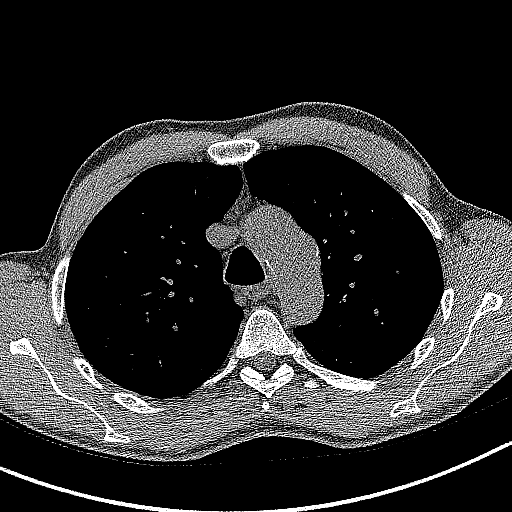
[im 230/322  lung]
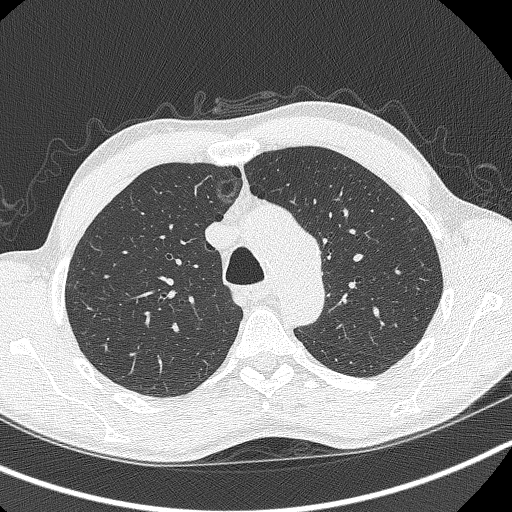
[im 245/322  lung]
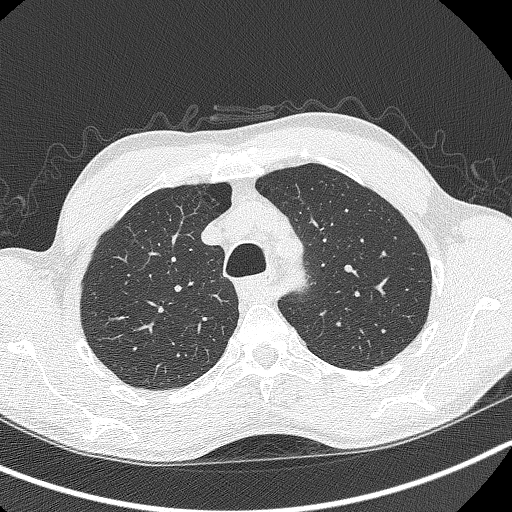
[im 276/322  lung]
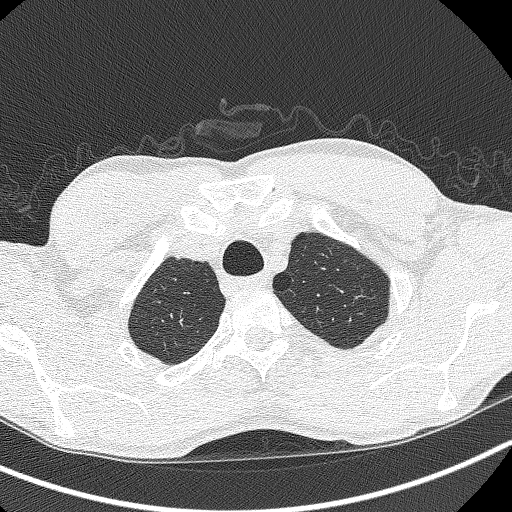
[im 306/322  lung]
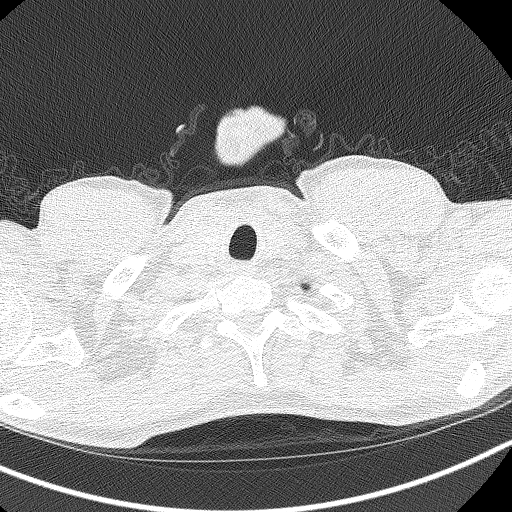

[Series 4: lung · coronal · 0.62mm/px · 3 of 264 slices shown (2 of 2)]
[im 53/264  lung]
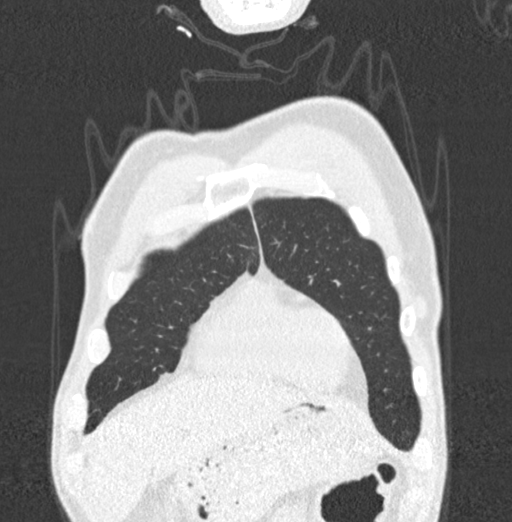
[im 106/264  lung]
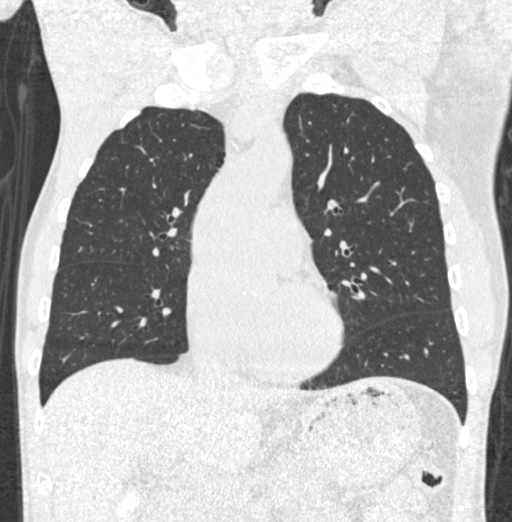
[im 158/264  lung]
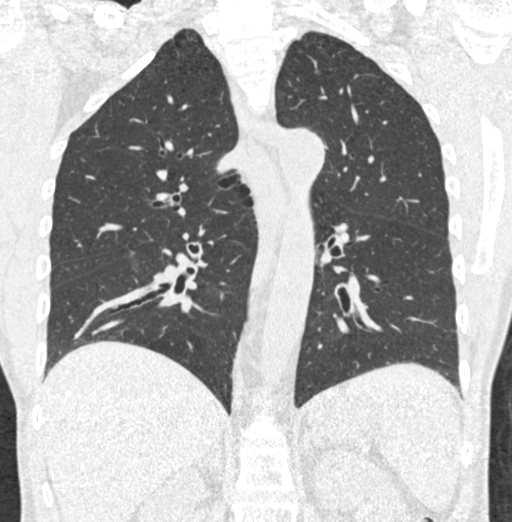

[15 of 40 positions shown; findings below may reference images not displayed]

FINDINGS: Cardiovascular: Normal heart size. No significant pericardial
effusion/thickening. Atherosclerotic thoracic aorta with ectatic
cm ascending thoracic aorta. Aberrant nonaneurysmal right subclavian
artery arising from the distal aortic arch with retroesophageal
course. Normal caliber pulmonary arteries.

Mediastinum/Nodes: No discrete thyroid nodules. Unremarkable
esophagus. No pathologically enlarged axillary, mediastinal or hilar
lymph nodes, noting limited sensitivity for the detection of hilar
adenopathy on this noncontrast study.

Lungs/Pleura: No pneumothorax. No pleural effusion. Mild
centrilobular and paraseptal emphysema. No acute consolidative
airspace disease or lung masses. A few scattered small solid
pulmonary nodules, largest a non solid nodule on the right upper
lobe measuring 4.4 mm in volume derived mean diameter (series
3/image 95).

Upper abdomen: Cholelithiasis. Coarse calcifications scattered
throughout the visualized pancreas with diffuse irregular pancreatic
duct dilation, compatible with chronic pancreatitis.

Musculoskeletal:  No aggressive appearing focal osseous lesions.
IMPRESSION: 1. Lung-RADS 2, benign appearance or behavior. Continue annual
screening with low-dose chest CT without contrast in 12 months.
2. Ectatic 4.0 cm ascending thoracic aorta, which can be reassessed
on follow-up screening chest CT.
3. Aberrant right subclavian artery.
4. Cholelithiasis.
5. Chronic pancreatitis.

Aortic Atherosclerosis (HF6PU-TY8.8) and Emphysema (HF6PU-QVX.O).

## 2020-11-02 NOTE — Telephone Encounter (Signed)
Call back from patient's facility, [facility] requesting culture results. Message given, stating culture requires recollection. Patient now has a foley in place, will need to be on the nurse schedule. Facility does not have supplies to collect a specimen there. Apt was made for a nurse visit catheter plug CX collection on 11/07/20 per facility transportation request.

## 2020-11-02 NOTE — Telephone Encounter (Signed)
-----   Message from [name], PA-C sent at 10/31/2020  8:06 AM EDT ----- We will need to get another sample from Mr. Jay Flores for urine culture as his specimen grew out more than 3 organisms.

## 2020-11-02 NOTE — Telephone Encounter (Signed)
Call back from [place] from patient's Facility due to transportation limitations unable to bring patient to the office on Monday. They are able to send someone to the office to collect urine cup and cath plug for collection. Proper collection technique was explained and verbalized understanding. Lab drop off of specimen on Friday.

## 2020-11-04 ENCOUNTER — Other Ambulatory Visit: Payer: Medicaid Other

## 2020-11-07 ENCOUNTER — Other Ambulatory Visit: Payer: Medicaid Other

## 2020-11-07 ENCOUNTER — Other Ambulatory Visit: Payer: Self-pay

## 2020-11-07 ENCOUNTER — Ambulatory Visit: Payer: Medicaid Other

## 2020-11-07 DIAGNOSIS — R339 Retention of urine, unspecified: Secondary | ICD-10-CM

## 2020-11-16 LAB — CULTURE, URINE COMPREHENSIVE

## 2020-11-17 ENCOUNTER — Other Ambulatory Visit: Payer: Self-pay

## 2020-11-17 ENCOUNTER — Encounter: Payer: Self-pay | Admitting: Urology

## 2020-11-17 ENCOUNTER — Ambulatory Visit (INDEPENDENT_AMBULATORY_CARE_PROVIDER_SITE_OTHER): Payer: Medicaid Other | Admitting: Urology

## 2020-11-17 VITALS — BP 96/59 | HR 67 | Ht 73.0 in | Wt 140.0 lb

## 2020-11-17 DIAGNOSIS — N401 Enlarged prostate with lower urinary tract symptoms: Secondary | ICD-10-CM

## 2020-11-17 DIAGNOSIS — R339 Retention of urine, unspecified: Secondary | ICD-10-CM | POA: Diagnosis not present

## 2020-11-17 NOTE — Progress Notes (Signed)
Cath Change/ Replacement  Patient is present today for a catheter change due to urinary retention.  68ml of water was removed from the balloon, a 18coude foley cath was removed with out difficulty.  Patient was cleaned and prepped in a sterile fashion with betadine. A 18 coude foley cath was replaced into the bladder no complications were noted Urine return was noted 62ml and urine was yellow in color. The balloon was filled with 84ml of sterile water. A night bag was attached for drainage. Patient was given proper instruction on catheter care.    Performed by: Teressa Lower, CMA  Follow up: Wednesday for voiding trial.

## 2020-11-17 NOTE — Progress Notes (Signed)
   11/18/20  CC:  Chief Complaint  Patient presents with   Cysto    HPI: Refer to [facility]'s note 10/26/2020  There were no vitals taken for this visit. NED. A&Ox3.   No respiratory distress   Abd soft, NT, ND Normal phallus with bilateral descended testicles  Cystoscopy Procedure Note  Patient identification was confirmed, informed consent was obtained, and patient was prepped using Betadine solution.  Lidocaine jelly was administered per urethral meatus.     Pre-Procedure: - Inspection reveals a normal caliber urethral meatus.  Procedure: The flexible cystoscope was introduced without difficulty - No urethral strictures/lesions are present. -  Nonocclusive  prostate  - Normal bladder neck - Bilateral ureteral orifices identified - Bladder mucosa  reveals no ulcers, tumors, or lesions - No bladder stones - No trabeculation  Retroflexion shows no abnormalities   Post-Procedure: - Patient tolerated the procedure well  Assessment/ Plan: Normal prostate without occlusion or bladder neck elevation Urinary retention most likely secondary to a hypotonic bladder He desired a repeat voiding trial.  Catheter was replaced today and he will follow-up next week for voiding trial Consider as directed study for persistent retention    Riki Altes, MD

## 2020-11-23 ENCOUNTER — Ambulatory Visit: Payer: Medicaid Other | Admitting: Physician Assistant

## 2020-11-23 ENCOUNTER — Ambulatory Visit (INDEPENDENT_AMBULATORY_CARE_PROVIDER_SITE_OTHER): Payer: Medicaid Other | Admitting: Physician Assistant

## 2020-11-23 ENCOUNTER — Other Ambulatory Visit: Payer: Self-pay

## 2020-11-23 DIAGNOSIS — R339 Retention of urine, unspecified: Secondary | ICD-10-CM

## 2020-11-23 LAB — BLADDER SCAN AMB NON-IMAGING

## 2020-11-23 NOTE — Patient Instructions (Signed)
I will see you back in clinic tomorrow to scan your bladder again.  You may stop Flomax (tamsulosin).

## 2020-11-23 NOTE — Progress Notes (Signed)
11/23/2020 2:58 PM   Dianne Dun Jan 22, 1961 [id number]  CC: Chief Complaint  Patient presents with   Urinary Retention    HPI: Jay Flores is a 60 y.o. male with PMH diabetes and urinary retention with nonocclusive prostate on cystoscopy with Dr. Lonna Cobb who presents today for repeat voiding trial.   He catheter removed in clinic this morning, see separate procedure note for details.  He returned to clinic in the afternoon.  He has been able to urinate.  He reports low p.o. fluid intake throughout the day.  PVR 229 mL.  He denies lower abdominal pain and states he feels he is emptying appropriately.  He has been taking Flomax 0.8 mg daily.  PMH: Past Medical History:  Diagnosis Date   Acute renal failure (HCC)    Altered mental status 01/20/2012   Diabetes mellitus without complication (HCC)    HIV (human immunodeficiency virus infection) (HCC)    Hypertension     Surgical History: Past Surgical History:  Procedure Laterality Date   NO PAST SURGERIES      Home Medications:  Allergies as of 11/23/2020   No Known Allergies      Medication List        Accurate as of November 23, 2020  2:58 PM. If you have any questions, ask your nurse or doctor.          STOP taking these medications    tamsulosin 0.4 MG Caps capsule Commonly known as: FLOMAX Stopped by: Carman Ching, PA-C       TAKE these medications    Accu-Chek Aviva Plus test strip Generic drug: glucose blood 1 each by Other route in the morning, at noon, and at bedtime. Use as instructed   Accu-Chek Softclix Lancets lancets by Other route. Use as instructed   atorvastatin 20 MG tablet Commonly known as: LIPITOR Take 20 mg by mouth daily.   insulin aspart protamine- aspart (70-30) 100 UNIT/ML injection Commonly known as: NOVOLOG MIX 70/30 Inject 14 Units into the skin 2 (two) times daily with a meal.   Juluca 50-25 MG tablet Generic drug:  dolutegravir-rilpivirine Take 1 tablet by mouth daily.   loperamide 2 MG capsule Commonly known as: IMODIUM Take 2 mg by mouth in the morning and at bedtime.   losartan 100 MG tablet Commonly known as: COZAAR Take 100 mg by mouth daily.   metoprolol succinate 100 MG 24 hr tablet Commonly known as: TOPROL-XL Take 150 mg by mouth daily.   Sodium Bicarbonate-Citric Acid 1940-1000 MG Tbef Take 1 tablet by mouth in the morning and at bedtime.   Vitamin D3 25 MCG (1000 UT) Caps Take 1 capsule by mouth daily.        Allergies:  No Known Allergies  Family History: Family History  Problem Relation Age of Onset   Diabetes Mellitus II Mother     Social History:   reports that he has been smoking cigarettes. He has a 34.00 pack-year smoking history. He has never used smokeless tobacco. He reports that he does not drink alcohol and does not use drugs.  Physical Exam: There were no vitals taken for this visit.  Constitutional:  Alert and oriented, no acute distress, nontoxic appearing HEENT: [employer], AT Cardiovascular: No clubbing, cyanosis, or edema Respiratory: Normal respiratory effort, no increased work of breathing Skin: No rashes, bruises or suspicious lesions Neurologic: Grossly intact, no focal deficits, moving all 4 extremities Psychiatric: Normal mood and affect  Laboratory Data: Results for orders  placed or performed in visit on 11/23/20  Bladder Scan (Post Void Residual) in office  Result Value Ref Range   Scan Result    Assessment & Plan:   1. Urinary retention Voiding trial equivocal today.  I explained that I am concerned he will go back into retention and that his relatively low residual is due to poor p.o. intake.  I offered the patient CIC teaching versus Foley catheter placement versus repeat PVR tomorrow and he elected for the latter.  We discussed that if he is found to have an elevated residual tomorrow, I will recommend Foley catheter placement and  urodynamics.  We discussed that if he is found to have a hypotonic bladder as the source of his retention, there is unfortunately no surgical solution for this and his bladder management options will include chronic indwelling Foley catheter versus CIC.  He expressed understanding.  Given nonocclusive prostate on cystoscopy, will stop Flomax at this time. - Bladder Scan (Post Void Residual) in office  Return in about 1 day (around 11/24/2020) for Repeat PVR.  Carman Ching, PA-C  [facility] [address] [place], [place] [address] [phone]

## 2020-11-23 NOTE — Progress Notes (Signed)
Catheter Removal  Patient is present today for a catheter removal.  63ml of water was drained from the balloon. A 18FR foley cath was removed from the bladder no complications were noted . Patient tolerated well.  Performed by: Franchot Erichsen CMA  Follow up/ Additional notes: RTC this afternoon for PVR.

## 2020-11-24 ENCOUNTER — Encounter: Payer: Self-pay | Admitting: Physician Assistant

## 2020-11-24 ENCOUNTER — Ambulatory Visit (INDEPENDENT_AMBULATORY_CARE_PROVIDER_SITE_OTHER): Payer: Medicaid Other | Admitting: Urology

## 2020-11-24 DIAGNOSIS — R339 Retention of urine, unspecified: Secondary | ICD-10-CM

## 2020-11-24 DIAGNOSIS — N401 Enlarged prostate with lower urinary tract symptoms: Secondary | ICD-10-CM

## 2020-11-24 LAB — BLADDER SCAN AMB NON-IMAGING

## 2020-11-24 NOTE — Progress Notes (Signed)
11/24/2020 6:57 PM   Dianne Dun 1960-07-03 [id number]  Referring provider: Benedetto Goad, MD [address] [facility] [address] [place],  [place] [address]  Chief Complaint  Patient presents with   Urinary Retention     Urological history: 1. Urinary retention -contributing factors of age and diabetes -occurred in 09/2020  2. BPH with LU TS -PSA 4.5 in 10/2020 -cysto 11/2020 - Normal prostate without occlusion or bladder neck elevation -I PSS 16/3 in 10/2020 -PVR 511 mL  3. High risk hematuria -smoker -work up in 2010 NED -no reports of gross heme -UA with micro heme in 10/2020 - but positive urine culture   HPI: Jay Flores is a 60 y.o. male who presents today for a follow up PVR from yesterday.    He was seen yesterday for a repeat voiding trial with Sam and was found to have a PVR of 229 mL.  He stated he had been urinating through out the day, but his p.o. intake had been less than ideal.    He has not been able to void.  His PVR this am is 511 mL.    A Foley catheter was placed during this visit.    His Flomax was discontinued at last visit.      PMH: Past Medical History:  Diagnosis Date   Acute renal failure (HCC)    Altered mental status 01/20/2012   Diabetes mellitus without complication (HCC)    HIV (human immunodeficiency virus infection) (HCC)    Hypertension     Surgical History: Past Surgical History:  Procedure Laterality Date   NO PAST SURGERIES      Home Medications:  Allergies as of 11/24/2020   No Known Allergies      Medication List        Accurate as of November 24, 2020 11:59 PM. If you have any questions, ask your nurse or doctor.          Accu-Chek Aviva Plus test strip Generic drug: glucose blood 1 each by Other route in the morning, at noon, and at bedtime. Use as instructed   Accu-Chek Softclix Lancets lancets by Other route. Use as instructed   atorvastatin 20 MG tablet Commonly known as:  LIPITOR Take 20 mg by mouth daily.   insulin aspart protamine- aspart (70-30) 100 UNIT/ML injection Commonly known as: NOVOLOG MIX 70/30 Inject 14 Units into the skin 2 (two) times daily with a meal.   Juluca 50-25 MG tablet Generic drug: dolutegravir-rilpivirine Take 1 tablet by mouth daily.   loperamide 2 MG capsule Commonly known as: IMODIUM Take 2 mg by mouth in the morning and at bedtime.   losartan 100 MG tablet Commonly known as: COZAAR Take 100 mg by mouth daily.   metoprolol succinate 100 MG 24 hr tablet Commonly known as: TOPROL-XL Take 150 mg by mouth daily.   Sodium Bicarbonate-Citric Acid 1940-1000 MG Tbef Take 1 tablet by mouth in the morning and at bedtime.   Vitamin D3 25 MCG (1000 UT) Caps Take 1 capsule by mouth daily.        Allergies: No Known Allergies  Family History: Family History  Problem Relation Age of Onset   Diabetes Mellitus II Mother     Social History:  reports that he has been smoking cigarettes. He has a 34.00 pack-year smoking history. He has never used smokeless tobacco. He reports that he does not drink alcohol and does not use drugs.  ROS: Pertinent ROS in  HPI  Physical Exam: Constitutional:  Well nourished. Alert and oriented, No acute distress. HEENT: [place] AT, mask in place.  Trachea midline Cardiovascular: No clubbing, cyanosis, or edema. Respiratory: Normal respiratory effort, no increased work of breathing. Neurologic: Grossly intact, no focal deficits, moving all 4 extremities. Psychiatric: Normal mood and affect.   Laboratory Data: N/A  Simple Catheter Placement Due to urinary retention patient is present today for a foley cath placement.  Patient was cleaned and prepped in a sterile fashion with betadine. A 18 FR Coude foley catheter was inserted, urine return was noted  500 ml, urine was yellow, clear in color.  The balloon was filled with 10cc of sterile water.  A night bag was attached for drainage.  Patient was  given instruction on proper catheter care.  Patient tolerated well, no complications were noted   Performed by: Michiel Cowboy, PA-C     Pertinent Imaging: Results for Jay, Flores (MRN [id number]) as of 11/24/2020 09:54  Ref. Range 11/24/2020 09:12  Scan Result Unknown     Assessment & Plan:    1. BPH with LUTS -cysto revealed a normal prostate without occlusion or bladder neck elevation -will refer on to UDS to evaluate for hypotonic bladder -I explained to the patient that urodynamics is a study that assesses how the bladder and urethra are performing their job of storing and releasing urine.  Urodynamic tests can help explain symptoms such as: incontinence, frequent urination, urgency, hesitancy, dysuria, urinary retention and/or recurrent UTI's.  To perform the test, special catheter is placed into the urethra, a rectal catheter is placed and electrodes are placed around the perineum.  The bladder is filled with water and the patient will be asked to void, it they can.  There may be a video component involved at some center, where the bladder activity is monitored by a video   2. Urinary retention -refer on for UDS  Return for appointment to review UDS results .  These notes generated with voice recognition software. I apologize for typographical errors.  Michiel Cowboy, PA-C  [facility] [address]  [address] [place], [place] [address] [phone]   I spent 15 minutes on the day of the encounter to include pre-visit record review, face-to-face time with the patient, and post-visit ordering of tests.

## 2020-12-16 ENCOUNTER — Other Ambulatory Visit: Payer: Self-pay | Admitting: Urology

## 2020-12-26 NOTE — Progress Notes (Signed)
12/27/2020 12:08 PM   Jay Flores [birth year] [id number]  Referring provider: Sharman Crate, MD [place],   [id number]  Chief Complaint  Patient presents with   Results     Urological history: 1. Urinary retention -UDS demonstrates  -contributing factors of age and diabetes -first occurred in 09/2020  2. BPH with LU TS -PSA 4.5 in 10/2020 -cysto 11/2020 - Normal prostate without occlusion or bladder neck elevation -I PSS 16/3 in 10/2020 -PVR 511 mL  3. High risk hematuria -smoker -work up in 2010 NED -no reports of gross heme -UA with micro heme in 10/2020 - but positive urine culture   HPI: Jay Flores is a 60 y.o. male who presents today for a UDS report and Foley catheter exchange.    He completed UDS on 12/14/2020.  He did experience leg spasms during the procedure so voiding pictures were difficult to obtain.  His maximum bladder capacity was 1000 mL.  There was positive instability.    He felt a strong urgency and leaked a minor amount off these contractions.  He was able to generate a voluntary contraction and void 17 mL with a max flow of 4 mL.  EMG leads were basically quiet during voiding.  PVR was approximately 981 mL.  Trabeculation and some elevation of the bladder base was noted.  No reflux was noted.  There was a small diverticulum on the left side of the bladder as well as a larger diverticulum at the apex.  The large diverticulum as a smaller 1 that is connected on the left side.  An 46 French coud was reinserted before the patient left the clinic.    PMH: Past Medical History:  Diagnosis Date   Acute renal failure ([place])    Altered mental status 01/20/2012   Diabetes mellitus without complication (HCC)    HIV (human immunodeficiency virus infection) ([place])    Hypertension     Surgical History: Past Surgical History:  Procedure Laterality Date   NO PAST SURGERIES      Home Medications:   Allergies as of 12/27/2020   No Known Allergies      Medication List        Accurate as of December 27, 2020 11:59 PM. If you have any questions, ask your nurse or doctor.          Accu-Chek Aviva Plus test strip Generic drug: glucose blood 1 each by Other route in the morning, at noon, and at bedtime. Use as instructed   Accu-Chek Softclix Lancets lancets by Other route. Use as instructed   atorvastatin 20 MG tablet Commonly known as: LIPITOR Take 20 mg by mouth daily.   insulin aspart protamine- aspart (70-30) 100 UNIT/ML injection Commonly known as: NOVOLOG MIX 70/30 Inject 14 Units into the skin 2 (two) times daily with a meal.   Juluca 50-25 MG tablet Generic drug: dolutegravir-rilpivirine Take 1 tablet by mouth daily.   loperamide 2 MG capsule Commonly known as: IMODIUM Take 2 mg by mouth in the morning and at bedtime.   losartan 100 MG tablet Commonly known as: COZAAR Take 100 mg by mouth daily.   metoprolol succinate 100 MG 24 hr tablet Commonly known as: TOPROL-XL Take 150 mg by mouth daily.   Sodium Bicarbonate-Citric Acid 1940-1000 MG Tbef Take 1 tablet by mouth in the morning and at bedtime.   Vitamin D3 25 MCG (1000 UT) Caps Take 1 capsule by mouth daily.  Allergies: No Known Allergies  Family History: Family History  Problem Relation Age of Onset   Diabetes Mellitus II Mother     Social History:  reports that he has been smoking cigarettes. He has a 34.00 pack-year smoking history. He has never used smokeless tobacco. He reports that he does not drink alcohol and does not use drugs.  ROS: Pertinent ROS in HPI  Physical Exam: Blood pressure 115/64, pulse 71, height 6' (1.829 m), weight 132 lb (59.9 kg).  Constitutional:  Well nourished. Alert and oriented, No acute distress. HEENT: [place] AT, mask in place.  Trachea midline Cardiovascular: No clubbing, cyanosis, or edema. Respiratory: Normal respiratory effort, no increased  work of breathing. GU: No CVA tenderness.  No bladder fullness or masses.  Patient with circumcised phallus.  Urethral meatus is patent.  No penile discharge.  No penile lesions or rashes.  Scrotum without lesions, cysts, rashes and/or edema.   Neurologic: Grossly intact, no focal deficits, moving all 4 extremities. Psychiatric: Normal mood and affect.   Laboratory Data: N/A   Pertinent Imaging: N/A  Catheter Removal Patient is present today for a catheter removal.  10 ml of water was drained from the balloon. A 18 FR coude foley cath was removed from the bladder no complications were noted . Patient tolerated well.  Simple Catheter Placement  Due to urinary retention patient is present today for a foley cath placement.  Patient was cleaned and prepped in a sterile fashion with betadine. A 18  FR coude foley catheter was inserted, urine return was noted  50 ml, urine was yellow clear in color.  The balloon was filled with 10cc of sterile water.  A leg bag was attached for drainage. Patient was given instruction on proper catheter care.  Patient tolerated well, no complications were noted    Assessment & Plan:    1. BPH with LUTS -cysto revealed a normal prostate without occlusion or bladder neck elevation -UDS revealed a hypotonic bladder -Foley catheter exchanged  -2. Urinary retention -UDS revealed a hypotonic bladder -managed with an indwelling Foley at this time -will try again to attempt CIC at next visit, if unsuccessful will need to consider a SPT placement   Return in about 1 month (around 01/26/2021) for foley exchange .  These notes generated with voice recognition software. I apologize for typographical errors.  Michiel Cowboy, PA-C  [facility] [address]  [address] [place], [place] [address] [phone]   I spent 15 minutes on the day of the encounter to include pre-visit record review, face-to-face time with the patient, and  post-visit ordering of tests.

## 2020-12-27 ENCOUNTER — Encounter: Payer: Self-pay | Admitting: Urology

## 2020-12-27 ENCOUNTER — Ambulatory Visit (INDEPENDENT_AMBULATORY_CARE_PROVIDER_SITE_OTHER): Payer: Medicaid Other | Admitting: Urology

## 2020-12-27 ENCOUNTER — Other Ambulatory Visit: Payer: Self-pay

## 2020-12-27 VITALS — BP 115/64 | HR 71 | Ht 72.0 in | Wt 132.0 lb

## 2020-12-27 DIAGNOSIS — R339 Retention of urine, unspecified: Secondary | ICD-10-CM

## 2020-12-27 DIAGNOSIS — N401 Enlarged prostate with lower urinary tract symptoms: Secondary | ICD-10-CM | POA: Diagnosis not present

## 2021-01-09 ENCOUNTER — Ambulatory Visit: Payer: Medicaid Other | Admitting: Physician Assistant

## 2021-01-09 ENCOUNTER — Telehealth: Payer: Self-pay

## 2021-01-09 NOTE — Telephone Encounter (Signed)
Incoming call from pt's caregiver who states that over the weekend the patient's catheter bag has become detached and they are in need of another one. Leg and night bag placed at front desk with reception for pick up.

## 2021-01-25 NOTE — Progress Notes (Signed)
Cath Change/ Replacement  Patient is present today for a catheter change due to urinary retention.  9 ml of water was removed from the balloon, a 18 FR foley cath was removed with out difficulty.  Patient was cleaned and prepped in a sterile fashion with betadine. A 18 FR foley cath was replaced into the bladder no complications were noted Urine return was noted 20 ml and urine was yellow in color. The balloon was filled with 37ml of sterile water. A leg bag was attached for drainage.  A night bag was also given to the patient and patient was given instruction on how to change from one bag to another. Patient was given proper instruction on catheter care.    Performed by: Sarita Bottom, CMA  Follow up: 1 month for Foley catheter exchange.  And in 2027 for repeat cystoscopy.

## 2021-01-26 ENCOUNTER — Ambulatory Visit (INDEPENDENT_AMBULATORY_CARE_PROVIDER_SITE_OTHER): Payer: Medicaid Other | Admitting: Urology

## 2021-01-26 ENCOUNTER — Other Ambulatory Visit: Payer: Self-pay

## 2021-01-26 ENCOUNTER — Encounter: Payer: Self-pay | Admitting: Urology

## 2021-01-26 VITALS — BP 124/75 | HR 49 | Ht 72.0 in | Wt 132.0 lb

## 2021-01-26 DIAGNOSIS — R339 Retention of urine, unspecified: Secondary | ICD-10-CM | POA: Diagnosis not present

## 2021-02-16 ENCOUNTER — Ambulatory Visit: Payer: Medicaid Other | Admitting: Podiatry

## 2021-02-23 ENCOUNTER — Encounter: Payer: Self-pay | Admitting: Podiatry

## 2021-02-23 ENCOUNTER — Ambulatory Visit (INDEPENDENT_AMBULATORY_CARE_PROVIDER_SITE_OTHER): Payer: Medicaid Other | Admitting: Podiatry

## 2021-02-23 ENCOUNTER — Other Ambulatory Visit: Payer: Self-pay

## 2021-02-23 DIAGNOSIS — E119 Type 2 diabetes mellitus without complications: Secondary | ICD-10-CM | POA: Diagnosis not present

## 2021-02-23 NOTE — Progress Notes (Signed)
Subjective: Jay Flores presents today referred by Sharman Crate, MD for diabetic foot evaluation.  Patient relates 2020 year history of diabetes.  Patient denies any history of foot wounds.  Patient hashistory of numbness, tingling, burning, pins/needles sensations.  Past Medical History:  Diagnosis Date   Acute renal failure ([place])    Altered mental status 01/20/2012   Diabetes mellitus without complication ([place])    HIV (human immunodeficiency virus infection) ([place])    Hypertension     Patient Active Problem List   Diagnosis Date Noted   Personal history of tobacco use, presenting hazards to health 04/17/2018   DKA (diabetic ketoacidoses) [id number]   Metabolic acidosis [id number]   Hypokalemia 01/23/2012   PNA (pneumonia) 01/22/2012   HIV (human immunodeficiency virus infection) ([place]) 01/22/2012   Brain lesion 01/22/2012   Acute renal failure ([place]) 01/22/2012   Anemia 01/22/2012   Altered mental status 01/22/2012    Past Surgical History:  Procedure Laterality Date   NO PAST SURGERIES      Current Outpatient Medications on File Prior to Visit  Medication Sig Dispense Refill   Accu-Chek Softclix Lancets lancets by Other route. Use as instructed     atorvastatin (LIPITOR) 20 MG tablet Take 20 mg by mouth daily.     Cholecalciferol (VITAMIN D3) 25 MCG (1000 UT) CAPS Take 1 capsule by mouth daily.     Dolutegravir-Rilpivirine (JULUCA) 50-25 MG TABS Take 1 tablet by mouth daily.     glucose blood (ACCU-CHEK AVIVA PLUS) test strip 1 each by Other route in the morning, at noon, and at bedtime. Use as instructed     insulin aspart protamine- aspart (NOVOLOG MIX 70/30) (70-30) 100 UNIT/ML injection Inject 14 Units into the skin 2 (two) times daily with a meal.     loperamide (IMODIUM) 2 MG capsule Take 2 mg by mouth in the morning and at bedtime.     losartan (COZAAR) 100 MG tablet Take 100 mg by mouth daily.     metoprolol succinate (TOPROL-XL) 100 MG 24 hr tablet  Take 150 mg by mouth daily.     Sodium Bicarbonate-Citric Acid 1940-1000 MG TBEF Take 1 tablet by mouth in the morning and at bedtime.     No current facility-administered medications on file prior to visit.     No Known Allergies  Social History   Occupational History   Occupation: disabled  Tobacco Use   Smoking status: Every Day    Packs/day: 1.00    Years: 34.00    Pack years: 34.00    Types: Cigarettes   Smokeless tobacco: Never  Vaping Use   Vaping Use: Never used  Substance and Sexual Activity   Alcohol use: No   Drug use: No    Types: Cocaine    Comment: 01/22/2012 "last used cocaine ~ 2012"   Sexual activity: Never    Family History  Problem Relation Age of Onset   Diabetes Mellitus II Mother     Immunization History  Administered Date(s) Administered   Influenza,inj,Quad PF,6+ Mos 03/31/2017   Pneumococcal Polysaccharide-23 03/31/2017    Review of systems: Positive Findings in bold print.  Constitutional:  chills, fatigue, fever, sweats, weight change Communication: Optometrist, sign Ecologist, hand writing, iPad/Android device Head: headaches, head injury Eyes: changes in vision, eye pain, glaucoma, cataracts, macular degeneration, diplopia, glare,  light sensitivity, eyeglasses or contacts, blindness Ears nose mouth throat: hearing impaired, hearing aids,  ringing in ears, deaf, sign language,  vertigo, nosebleeds,  rhinitis,  cold sores, snoring, swollen glands Cardiovascular: HTN, edema, arrhythmia, pacemaker in place, defibrillator in place, chest pain/tightness, chronic anticoagulation, blood clot, heart failure, MI Peripheral Vascular: leg cramps, varicose veins, blood clots, lymphedema, varicosities Respiratory:  asthma, difficulty breathing, denies congestion, SOB, wheezing, cough, emphysema Gastrointestinal: change in appetite or weight, abdominal pain, constipation, diarrhea, nausea, vomiting, vomiting blood, change in bowel habits,  abdominal pain, jaundice, rectal bleeding, hemorrhoids, GERD Genitourinary:  nocturia,  pain on urination, polyuria,  blood in urine, Foley catheter, urinary urgency, ESRD on hemodialysis Musculoskeletal: amputation, cramping, stiff joints, painful joints, decreased joint motion, fractures, OA, gout, hemiplegia, paraplegia, uses cane, wheelchair bound, uses walker, uses rollator Skin: +changes in toenails, color change, dryness, itching, mole changes,  rash, wound(s) Neurological: headaches, numbness in feet, paresthesias in feet, burning in feet, fainting,  seizures, change in speech, migraines, memory problems/poor historian, cerebral palsy, weakness, paralysis, CVA, TIA Endocrine: diabetes, hypothyroidism, hyperthyroidism,  goiter, dry mouth, flushing, heat intolerance, cold intolerance,  excessive thirst, denies polyuria,  nocturia Hematological:  easy bleeding, excessive bleeding, easy bruising, enlarged lymph nodes, on long term blood thinner, history of past transusions Allergy/immunological:  hives, eczema, frequent infections, multiple drug allergies, seasonal allergies, transplant recipient, multiple food allergies Psychiatric:  anxiety, depression, mood disorder, suicidal ideations, hallucinations, insomnia  Objective: There were no vitals filed for this visit. Vascular Examination: Capillary refill time less than 4 seconds x 10 digits.  Dorsalis pedis pulses palpable 2 out of 4.  Posterior tibial pulses palpable 2 out of 4.  Digital hair not present x 10 digits.  Skin temperature gradient WNL b/l.  Dermatological Examination: Skin with normal turgor, texture and tone b/l  Toenails 1-5 b/l discolored, thick, dystrophic with subungual debris and pain with palpation to nailbeds due to thickness of nails.  Musculoskeletal: Muscle strength 5/5 to all LE muscle groups.  Neurological: Sensation diminished with 10 gram monofilament.  Vibratory sensation  diminished  Assessment: NIDDM Encounter for diabetic foot examination Neuropathic pain  Plan: Discussed diabetic foot care principles. Literature dispensed on today. Patient to continue soft, supportive shoe gear daily. Patient to report any pedal injuries to medical professional immediately. Follow up 6 months Patient/POA to call should there be a concern in the interim.

## 2021-02-27 NOTE — Progress Notes (Signed)
Cath Change/ Replacement  Patient is present today for a catheter change due to urinary retention.  8 ml of water was removed from the balloon, a 18 FR Silastic foley cath was removed with out difficulty.  Patient was cleaned and prepped in a sterile fashion with betadine. A 18 FR Silastic foley cath was replaced into the bladder no complications were noted Urine return was noted 20 ml and urine was yellow in color. The balloon was filled with 58ml of sterile water. A leg bag was attached for drainage.  A night bag was also given to the patient and patient was given instruction on how to change from one bag to another. Patient was given proper instruction on catheter care.    Performed by: Michiel Cowboy, PA-C   Follow up: One month for catheter exchange

## 2021-02-28 ENCOUNTER — Ambulatory Visit (INDEPENDENT_AMBULATORY_CARE_PROVIDER_SITE_OTHER): Payer: Medicaid Other | Admitting: Urology

## 2021-02-28 ENCOUNTER — Other Ambulatory Visit: Payer: Self-pay

## 2021-02-28 DIAGNOSIS — R339 Retention of urine, unspecified: Secondary | ICD-10-CM | POA: Diagnosis not present

## 2021-03-28 NOTE — Progress Notes (Signed)
Cath Change/ Replacement  Patient is present today for a catheter change due to urinary retention.  8 ml of water was removed from the balloon, a 18 FR foley cath was removed with out difficulty.  Patient was cleaned and prepped in a sterile fashion with betadine. A 18 FR Silastic foley cath was replaced into the bladder no complications were noted Urine return was noted 30 ml and urine was yellow in color. The balloon was filled with 7ml of sterile water. A leg bag was attached for drainage.  A night bag was also given to the patient and patient was given instruction on how to change from one bag to another. Patient was given proper instruction on catheter care.    Performed by: Michiel Cowboy, PA-C   Follow up: One month for catheter exchange

## 2021-03-29 ENCOUNTER — Ambulatory Visit (INDEPENDENT_AMBULATORY_CARE_PROVIDER_SITE_OTHER): Payer: Medicaid Other | Admitting: Urology

## 2021-03-29 ENCOUNTER — Other Ambulatory Visit: Payer: Self-pay

## 2021-03-29 DIAGNOSIS — R339 Retention of urine, unspecified: Secondary | ICD-10-CM

## 2021-04-27 NOTE — Progress Notes (Signed)
Cath Change/ Replacement ? ?Patient is present today for a catheter change due to urinary retention.  8 ml of water was removed from the balloon, a 18 FR foley cath was removed with out difficulty.  Patient was cleaned and prepped in a sterile fashion with betadine. A 18 FR foley cath was replaced into the bladder no complications were noted Urine return was noted 20 ml and urine was yellow/clear in color. The balloon was filled with 49ml of sterile water. A leg bag was attached for drainage.  A night bag was also given to the patient and patient was given instruction on how to change from one bag to another. Patient was given proper instruction on catheter care.   ? ?Performed by: Michiel Cowboy, PA-C  ? ?Follow up: Return in about 1 month (around 05/29/2021) for Foley exchange . ? ?I,Merion Grimaldo,acting as a scribe for [employer], PA-C.,have documented all relevant documentation on the behalf of Jay Lenker, PA-C,as directed by  [facility], PA-C while in the presence of Jay Fredman, PA-C. ? ?I have reviewed the above documentation for accuracy and completeness, and I agree with the above.   ? ?Michiel Cowboy, PA-C  ?

## 2021-04-28 ENCOUNTER — Other Ambulatory Visit: Payer: Self-pay

## 2021-04-28 ENCOUNTER — Ambulatory Visit (INDEPENDENT_AMBULATORY_CARE_PROVIDER_SITE_OTHER): Payer: Medicaid Other | Admitting: Urology

## 2021-04-28 DIAGNOSIS — R339 Retention of urine, unspecified: Secondary | ICD-10-CM

## 2021-05-07 DIAGNOSIS — Z87898 Personal history of other specified conditions: Secondary | ICD-10-CM | POA: Insufficient documentation

## 2021-05-07 DIAGNOSIS — N319 Neuromuscular dysfunction of bladder, unspecified: Secondary | ICD-10-CM | POA: Insufficient documentation

## 2021-05-30 NOTE — Progress Notes (Signed)
Cath Change/ Replacement ? ?Patient is present today for a catheter change due to urinary retention.  8 ml of water was removed from the balloon, a 18 FR foley cath was removed with out difficulty.  Patient was cleaned and prepped in a sterile fashion with betadine. A 18 FR foley cath was replaced into the bladder no complications were noted Urine return was noted 30 ml and urine was yellow in color. The balloon was filled with 51ml of sterile water. A leg bag and Stat Lock was attached for drainage.  Patient was given proper instruction on catheter care.   ? ?Performed by: Michiel Cowboy, PA-C  ? ?Follow up: One month for Foley catheter exchange   ?

## 2021-05-31 ENCOUNTER — Ambulatory Visit (INDEPENDENT_AMBULATORY_CARE_PROVIDER_SITE_OTHER): Payer: Medicaid Other | Admitting: Urology

## 2021-05-31 DIAGNOSIS — Z466 Encounter for fitting and adjustment of urinary device: Secondary | ICD-10-CM | POA: Diagnosis not present

## 2021-05-31 DIAGNOSIS — R339 Retention of urine, unspecified: Secondary | ICD-10-CM | POA: Diagnosis not present

## 2021-06-15 ENCOUNTER — Encounter: Payer: Self-pay | Admitting: Podiatry

## 2021-06-15 ENCOUNTER — Ambulatory Visit (INDEPENDENT_AMBULATORY_CARE_PROVIDER_SITE_OTHER): Payer: Medicaid Other | Admitting: Podiatry

## 2021-06-15 DIAGNOSIS — M79675 Pain in left toe(s): Secondary | ICD-10-CM

## 2021-06-15 DIAGNOSIS — M79674 Pain in right toe(s): Secondary | ICD-10-CM

## 2021-06-15 DIAGNOSIS — E1142 Type 2 diabetes mellitus with diabetic polyneuropathy: Secondary | ICD-10-CM

## 2021-06-15 DIAGNOSIS — B351 Tinea unguium: Secondary | ICD-10-CM

## 2021-06-15 DIAGNOSIS — N179 Acute kidney failure, unspecified: Secondary | ICD-10-CM | POA: Diagnosis not present

## 2021-06-15 DIAGNOSIS — E1140 Type 2 diabetes mellitus with diabetic neuropathy, unspecified: Secondary | ICD-10-CM | POA: Insufficient documentation

## 2021-06-15 NOTE — Progress Notes (Signed)
This patient returns to my office for at risk foot care.  This patient requires this care by a professional since this patient will be at risk due to having diabetes and kidney disease.  Patient presents to the office with caretaker.  This patient is unable to cut nails himself since the patient cannot reach his nails.These nails are painful walking and wearing shoes.  This patient presents for at risk foot care today. ? ?General Appearance  Alert, conversant and in no acute stress. ? ?Vascular  Dorsalis pedis and posterior tibial  pulses are palpable  bilaterally.  Capillary return is within normal limits  bilaterally. Temperature is within normal limits  bilaterally. ? ?Neurologic  Senn-Weinstein monofilament wire test diminished  bilaterally. Muscle power within normal limits bilaterally. ? ?Nails Thick disfigured discolored nails with subungual debris  from hallux to fifth toes bilaterally. No evidence of bacterial infection or drainage bilaterally. ? ?Orthopedic  No limitations of motion  feet .  No crepitus or effusions noted.  No bony pathology or digital deformities noted. ? ?Skin  normotropic skin with no porokeratosis noted bilaterally.  No signs of infections or ulcers noted.    ? ?Onychomycosis  Pain in right toes  Pain in left toes ? ?Consent was obtained for treatment procedures.   Mechanical debridement of nails 1-5  bilaterally performed with a nail nipper.  Filed with dremel without incident.  ? ? ?Return office visit  4 months                    Told patient to return for periodic foot care and evaluation due to potential at risk complications. ? ? ?Helane Gunther DPM   ?

## 2021-06-29 ENCOUNTER — Ambulatory Visit (INDEPENDENT_AMBULATORY_CARE_PROVIDER_SITE_OTHER): Payer: Medicaid Other | Admitting: Urology

## 2021-06-29 DIAGNOSIS — R339 Retention of urine, unspecified: Secondary | ICD-10-CM

## 2021-06-29 DIAGNOSIS — N401 Enlarged prostate with lower urinary tract symptoms: Secondary | ICD-10-CM

## 2021-06-29 NOTE — Progress Notes (Signed)
Cath Change/ Replacement  Patient is present today for a catheter change due to urinary retention.  8 ml of water was removed from the balloon, a 18 FR foley cath was removed with out difficulty.  Patient was cleaned and prepped in a sterile fashion with betadine. A 18 FR foley cath was replaced into the bladder no complications were noted Urine return was noted 50 ml and urine was yellow in color. The balloon was filled with 10ml of sterile water. A leg bag was attached for drainage.   Patient was given proper instruction on catheter care.    Performed by: Zara Council, PA-C   Follow up: One month for Foley exchange

## 2021-06-30 ENCOUNTER — Telehealth: Payer: Self-pay

## 2021-06-30 LAB — PSA: Prostate Specific Ag, Serum: 3.8 ng/mL (ref 0.0–4.0)

## 2021-06-30 NOTE — Telephone Encounter (Signed)
-----   Message from [name], PA-C sent at 06/30/2021  7:57 AM EDT ----- Please let Mr. [place] know that his PSA is normal at 3.8.

## 2021-06-30 NOTE — Telephone Encounter (Signed)
Notified pt as advised, they expressed understanding.  

## 2021-07-27 ENCOUNTER — Ambulatory Visit: Payer: Medicaid Other | Admitting: Urology

## 2021-07-27 ENCOUNTER — Telehealth: Payer: Self-pay | Admitting: Urology

## 2021-07-27 NOTE — Progress Notes (Incomplete)
Cath Change/ Replacement  Patient is present today for a catheter change due to urinary retention.  9 ml of water was removed from the balloon, a 16 FR foley cath was removed with out difficulty.  Patient was cleaned and prepped in a sterile fashion with betadine. A 16 FR foley cath was replaced into the bladder no complications were noted Urine return was noted 10 ml and urine was yellow in color. The balloon was filled with 10ml of sterile water. A leg bag was attached for drainage.  A night bag was also given to the patient and patient was given instruction on how to change from one bag to another. Patient was given proper instruction on catheter care.    Performed by: SHANNON MCGOWAN, PA-C   Follow up: One month for Foley exchange   

## 2021-07-27 NOTE — Telephone Encounter (Signed)
Please call Jay Flores and have him reschedule his missed appointment so that we can see him sometime next week to exchange his catheter.

## 2021-07-27 NOTE — Telephone Encounter (Signed)
LMOM for pt to return call to r/s appt.

## 2021-07-28 ENCOUNTER — Encounter: Payer: Self-pay | Admitting: Urology

## 2021-08-02 ENCOUNTER — Ambulatory Visit: Payer: Medicaid Other | Admitting: Urology

## 2021-08-02 NOTE — Progress Notes (Incomplete)
Cath Change/ Replacement  Patient is present today for a catheter change due to urinary retention.  9 ml of water was removed from the balloon, a 16 FR foley cath was removed with out difficulty.  Patient was cleaned and prepped in a sterile fashion with betadine. A 16 FR foley cath was replaced into the bladder no complications were noted Urine return was noted 10 ml and urine was yellow in color. The balloon was filled with 10ml of sterile water. A leg bag was attached for drainage.  A night bag was also given to the patient and patient was given instruction on how to change from one bag to another. Patient was given proper instruction on catheter care.    Performed by: SHANNON MCGOWAN, PA-C   Follow up: One month for Foley exchange   

## 2021-08-03 ENCOUNTER — Ambulatory Visit (INDEPENDENT_AMBULATORY_CARE_PROVIDER_SITE_OTHER): Payer: Medicaid Other | Admitting: Urology

## 2021-08-03 DIAGNOSIS — R339 Retention of urine, unspecified: Secondary | ICD-10-CM | POA: Diagnosis not present

## 2021-08-03 NOTE — Progress Notes (Signed)
Cath Change/ Replacement  Patient is present today for a catheter change due to urinary retention.  67ml of water was removed from the balloon, a 18FR foley cath was removed with out difficulty.  Patient was cleaned and prepped in a sterile fashion with betadine. A 18 FR foley cath was replaced into the bladder no complications were noted Urine return was noted 31ml and urine was clear/yellow in color. The balloon was filled with 92ml of sterile water. A  bag was attached for drainage.  A night bag was also given to the patient and patient was given instruction on how to change from one bag to another. Patient was given proper instruction on catheter care.    Performed by: Michiel Cowboy, PA-C   Follow up: Return in about 1 month (around 09/02/2021) for Foley exchange .  I, Kailey Littlejohn,acting as a scribe for [employer], PA-C.,have documented all relevant documentation on the behalf of Howell Groesbeck, PA-C,as directed by  [facility], PA-C while in the presence of Alp Goldwater, PA-C.  I have reviewed the above documentation for accuracy and completeness, and I agree with the above.    Michiel Cowboy, PA-C

## 2021-08-31 ENCOUNTER — Ambulatory Visit: Payer: Medicaid Other | Admitting: Urology

## 2021-08-31 NOTE — Progress Notes (Incomplete)
Cath Change/ Replacement  Patient is present today for a catheter change due to urinary retention.  9 ml of water was removed from the balloon, a 16 FR foley cath was removed with out difficulty.  Patient was cleaned and prepped in a sterile fashion with betadine. A 16 FR foley cath was replaced into the bladder no complications were noted Urine return was noted 10 ml and urine was yellow in color. The balloon was filled with 10ml of sterile water. A leg bag was attached for drainage.  A night bag was also given to the patient and patient was given instruction on how to change from one bag to another. Patient was given proper instruction on catheter care.    Performed by: SHANNON MCGOWAN, PA-C   Follow up: One month for Foley exchange   

## 2021-09-06 NOTE — Progress Notes (Deleted)
Cath Change/ Replacement  Patient is present today for a catheter change due to urinary retention.  9 ml of water was removed from the balloon, a 16 FR foley cath was removed with out difficulty.  Patient was cleaned and prepped in a sterile fashion with betadine. A 16 FR foley cath was replaced into the bladder no complications were noted Urine return was noted 10 ml and urine was yellow in color. The balloon was filled with 10ml of sterile water. A leg bag was attached for drainage.  A night bag was also given to the patient and patient was given instruction on how to change from one bag to another. Patient was given proper instruction on catheter care.    Performed by: Alizon Schmeling, PA-C   Follow up: One month for Foley exchange   

## 2021-09-07 ENCOUNTER — Ambulatory Visit: Payer: Medicaid Other | Admitting: Urology

## 2021-09-07 ENCOUNTER — Telehealth: Payer: Self-pay

## 2021-09-07 NOTE — Telephone Encounter (Signed)
Incoming call from Nixon at patient's group home who states that they are interested in setting up home health for the patient's monthly catheter exchanges. Advised that home health will not typically come out for the sole purpose of exchanging catheters, however they are welcome to call around to possibly find a home health provider.

## 2021-09-14 ENCOUNTER — Ambulatory Visit (INDEPENDENT_AMBULATORY_CARE_PROVIDER_SITE_OTHER): Payer: Medicaid Other | Admitting: Physician Assistant

## 2021-09-14 DIAGNOSIS — R339 Retention of urine, unspecified: Secondary | ICD-10-CM | POA: Diagnosis not present

## 2021-09-14 MED ORDER — CEFTRIAXONE SODIUM 1 G IJ SOLR
1.0000 g | Freq: Once | INTRAMUSCULAR | Status: AC
  Administered 2021-09-14: 1 g via INTRAMUSCULAR

## 2021-09-14 NOTE — Progress Notes (Signed)
Cath Change/ Replacement  Patient is present today for a catheter change due to urinary retention.  63m of water was removed from the balloon, a 18FR foley cath was removed without difficulty.  Patient was cleaned and prepped in a sterile fashion with betadine and 2% lidocaine jelly was instilled into the urethra. A 18 FR coud foley cath was replaced into the bladder no complications were noted Urine return was not noted and patient was counseled to return to clinic if no drainage was noted within 2 hours. The balloon was filled with 176mof sterile water. A leg bag was attached for drainage.  Patient tolerated well.  Performed by: SaDebroah LoopPA-C and CrBradly BienenstockCMA  Additional Notes: I initially attempted to place an 1879rench straight Foley catheter, but met significant resistance at the prostate was unable to advance the catheter to the level of the bladder.  I was able to successfully place an 185r[place] catheter after instillation of a second tube of lighted jet.  We treated him with 1 dose of IM Rocephin after the procedure today given scant bleeding for UTI prevention in the setting of chronic indwelling Foley catheter.  Follow up: Return in about 4 weeks (around 10/12/2021) for Catheter exchange.

## 2021-09-14 NOTE — Progress Notes (Signed)
IM Injection  Patient is present today for an IM Injection for prevention of UTI. Drug: Ceftriaxone Dose:1g Location:Right upper outer buttocks Lot: [id number]  Exp:03/2023 Patient tolerated well, no complications were noted  Performed by: Franchot Erichsen CMA

## 2021-09-18 ENCOUNTER — Ambulatory Visit: Payer: Medicaid Other | Admitting: Podiatry

## 2021-10-12 NOTE — Progress Notes (Signed)
Cath Change/ Replacement  Patient is present today for a catheter change due to urinary retention.  8 ml of water was removed from the balloon, a 18 FR Coude foley cath was removed without difficulty.  Patient was cleaned and prepped in a sterile fashion with betadine and 2% lidocaine jelly was instilled into the urethra. A 18 FR Coude foley cath was replaced into the bladder, no complications were noted. Urine return was noted 20 ml and urine was yellow in color. The balloon was filled with 25ml of sterile water. A leg bag was attached for drainage.  A night bag was also given to the patient and patient was given instruction on how to change from one bag to another. Patient was given proper instruction on catheter care.    Performed by: Michiel Cowboy, PA-C   Follow up: I discussed possible suprapubic placement for him as his catheter exchanges appear to be coming more painful for him.  He stated he would like to think it over.  I have put more information about suprapubic catheters in his AVS today.  He will return in 1 month for Foley catheter exchange and further discussion regarding suprapubic tube placement.   I spent 10 minutes on the day of the encounter to include pre-visit record review, face-to-face time with the patient, and post-visit ordering of tests.

## 2021-10-13 ENCOUNTER — Ambulatory Visit (INDEPENDENT_AMBULATORY_CARE_PROVIDER_SITE_OTHER): Payer: Medicaid Other | Admitting: Urology

## 2021-10-13 ENCOUNTER — Encounter: Payer: Self-pay | Admitting: Urology

## 2021-10-13 DIAGNOSIS — R339 Retention of urine, unspecified: Secondary | ICD-10-CM

## 2021-10-13 DIAGNOSIS — Z978 Presence of other specified devices: Secondary | ICD-10-CM

## 2021-10-26 ENCOUNTER — Ambulatory Visit: Payer: Medicaid Other | Admitting: Podiatry

## 2021-11-09 NOTE — Progress Notes (Signed)
Cath Change/ Replacement  Patient is present today for a catheter change due to urinary retention.  8 ml of water was removed from the balloon, a 18 FR Coude foley cath was removed without difficulty.  Patient was cleaned and prepped in a sterile fashion with betadine and 2% lidocaine jelly was instilled into the urethra. A 18 FR Coude foley cath was replaced into the bladder, no complications were noted. Urine return was noted 20 ml and urine was yellow clear in color. The balloon was filled with 37ml of sterile water. A leg bag was attached for drainage.  Patient was given proper instruction on catheter care.    Performed by: Zara Council, PA-C   Follow up: Discontinue the tamsulosin.  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  We may need to consider transitioning to a suprapubic tube in the future if erosion continues.  One month for Foley exchange.

## 2021-11-10 ENCOUNTER — Ambulatory Visit (INDEPENDENT_AMBULATORY_CARE_PROVIDER_SITE_OTHER): Payer: Medicaid Other | Admitting: Urology

## 2021-11-10 DIAGNOSIS — Z978 Presence of other specified devices: Secondary | ICD-10-CM

## 2021-11-10 DIAGNOSIS — R339 Retention of urine, unspecified: Secondary | ICD-10-CM

## 2021-11-17 ENCOUNTER — Telehealth: Payer: Self-pay

## 2021-11-17 ENCOUNTER — Other Ambulatory Visit: Payer: Self-pay

## 2021-11-17 DIAGNOSIS — Z1211 Encounter for screening for malignant neoplasm of colon: Secondary | ICD-10-CM

## 2021-11-17 MED ORDER — NA SULFATE-K SULFATE-MG SULF 17.5-3.13-1.6 GM/177ML PO SOLN: 1.0000 | Freq: Once | ORAL | 0 refills | Status: AC

## 2021-11-17 NOTE — Telephone Encounter (Signed)
Gastroenterology Pre-Procedure Review  Request Date: 12/12/21 Requesting Physician: Dr. Vicente Males  PATIENT REVIEW QUESTIONS: The patient resides at [facility].  Triage questions answered by RN-Brandi [facility] Ph# [phone]   [facility] [phone] Fax# [phone]  Patient has a foley cath.   1. Are you having any GI issues? no 2. Do you have a personal history of Polyps? no 3. Do you have a family history of Colon Cancer or Polyps? no 4. Diabetes Mellitus? yes (type 2) 5. Joint replacements in the past 12 months?no 6. Major health problems in the past 3 months?no 7. Any artificial heart valves, MVP, or defibrillator?no    MEDICATIONS & ALLERGIES:    Patient reports the following regarding taking any anticoagulation/antiplatelet therapy:   Plavix, Coumadin, Eliquis, Xarelto, Lovenox, Pradaxa, Brilinta, or Effient? no Aspirin? no  Patient confirms/reports the following medications:  Current Outpatient Medications  Medication Sig Dispense Refill   Accu-Chek Softclix Lancets lancets by Other route. Use as instructed     atorvastatin (LIPITOR) 20 MG tablet Take 20 mg by mouth daily.     Cholecalciferol (VITAMIN D3) 25 MCG (1000 UT) CAPS Take 1 capsule by mouth daily.     Dolutegravir-Rilpivirine (JULUCA) 50-25 MG TABS Take 1 tablet by mouth daily.     glucose blood (ACCU-CHEK AVIVA PLUS) test strip 1 each by Other route in the morning, at noon, and at bedtime. Use as instructed     insulin aspart protamine- aspart (NOVOLOG MIX 70/30) (70-30) 100 UNIT/ML injection Inject 14 Units into the skin 2 (two) times daily with a meal.     loperamide (IMODIUM) 2 MG capsule Take 2 mg by mouth in the morning and at bedtime.     losartan (COZAAR) 100 MG tablet Take 100 mg by mouth daily.     metoprolol succinate (TOPROL-XL) 100 MG 24 hr tablet Take 150 mg by mouth daily.     Sodium Bicarbonate-Citric Acid 1940-1000 MG TBEF Take 1 tablet by mouth in the morning and at bedtime.      tamsulosin (FLOMAX) 0.4 MG CAPS capsule Take 0.4 mg by mouth.     No current facility-administered medications for this visit.    Patient confirms/reports the following allergies:  No Known Allergies  No orders of the defined types were placed in this encounter.   AUTHORIZATION INFORMATION Primary Insurance: 1D#: Group #:  Secondary Insurance: 1D#: Group #:  SCHEDULE INFORMATION: Date: 12/12/21 Time: Location: [facility]

## 2021-11-23 ENCOUNTER — Encounter: Payer: Self-pay | Admitting: Podiatry

## 2021-11-23 ENCOUNTER — Ambulatory Visit (INDEPENDENT_AMBULATORY_CARE_PROVIDER_SITE_OTHER): Payer: Medicaid Other | Admitting: Podiatry

## 2021-11-23 DIAGNOSIS — L84 Corns and callosities: Secondary | ICD-10-CM | POA: Diagnosis not present

## 2021-11-23 DIAGNOSIS — E1142 Type 2 diabetes mellitus with diabetic polyneuropathy: Secondary | ICD-10-CM | POA: Diagnosis not present

## 2021-11-23 DIAGNOSIS — B351 Tinea unguium: Secondary | ICD-10-CM

## 2021-11-23 DIAGNOSIS — M79675 Pain in left toe(s): Secondary | ICD-10-CM | POA: Diagnosis not present

## 2021-11-23 DIAGNOSIS — M79674 Pain in right toe(s): Secondary | ICD-10-CM

## 2021-11-23 DIAGNOSIS — N179 Acute kidney failure, unspecified: Secondary | ICD-10-CM | POA: Diagnosis not present

## 2021-11-23 NOTE — Progress Notes (Signed)
This patient returns to my office for at risk foot care.  This patient requires this care by a professional since this patient will be at risk due to having diabetes and kidney disease.  Pt has painful callus left foot when walking.This patient is unable to cut nails himself since the patient cannot reach his nails.These nails are painful walking and wearing shoes.  This patient presents for at risk foot care today.  General Appearance  Alert, conversant and in no acute stress.  Vascular  Dorsalis pedis and posterior tibial  pulses are palpable  bilaterally.  Capillary return is within normal limits  bilaterally. Temperature is within normal limits  bilaterally.  Neurologic  Senn-Weinstein monofilament wire test diminished  bilaterally. Muscle power within normal limits bilaterally.  Nails Thick disfigured discolored nails with subungual debris  from hallux to fifth toes bilaterally. No evidence of bacterial infection or drainage bilaterally.  Orthopedic  No limitations of motion  feet .  No crepitus or effusions noted.  No bony pathology or digital deformities noted.  Skin  normotropic skin noted bilaterally.  No signs of infections or ulcers noted.   Porokeratosis sub 5 left foot.  Onychomycosis  Pain in right toes  Pain in left toes  Porokeratosis left foot.  Consent was obtained for treatment procedures.   Mechanical debridement of nails 1-5  bilaterally performed with a nail nipper.  Filed with dremel without incident. Debridement of callus with # 15 blade.   Return office visit  3   months                    Told patient to return for periodic foot care and evaluation due to potential at risk complications.   Gardiner Barefoot DPM

## 2021-12-06 NOTE — Progress Notes (Unsigned)
Cath Change/ Replacement  Patient is present today for a catheter change due to urinary retention.  8 ml of water was removed from the balloon, a 18 FR Coude foley cath was removed without difficulty.  Patient was cleaned and prepped in a sterile fashion with betadine and 2% lidocaine jelly was instilled into the urethra. A 18 FR Coude foley cath was replaced into the bladder, no complications were noted. Urine return was noted 20 ml and urine was clear yellow in color. The balloon was filled with 10ml of sterile water. A leg bag was attached for drainage.  Patient was given proper instruction on catheter care.    Performed by: Michiel Cowboy, PA-C   Follow up: One month for catheter exchange

## 2021-12-07 ENCOUNTER — Ambulatory Visit (INDEPENDENT_AMBULATORY_CARE_PROVIDER_SITE_OTHER): Payer: Medicaid Other | Admitting: Urology

## 2021-12-07 DIAGNOSIS — R339 Retention of urine, unspecified: Secondary | ICD-10-CM | POA: Diagnosis not present

## 2021-12-07 DIAGNOSIS — Z978 Presence of other specified devices: Secondary | ICD-10-CM

## 2021-12-11 ENCOUNTER — Encounter: Payer: Self-pay | Admitting: Gastroenterology

## 2021-12-12 ENCOUNTER — Ambulatory Visit: Payer: Medicaid Other | Admitting: Urology

## 2021-12-12 ENCOUNTER — Telehealth: Payer: Self-pay

## 2021-12-12 ENCOUNTER — Encounter: Payer: Self-pay | Admitting: Gastroenterology

## 2021-12-12 ENCOUNTER — Encounter
Admission: RE | Disposition: A | Discharge status: Nursing Home (Includes ICF) | Payer: Self-pay | Source: Home / Self Care | Attending: Gastroenterology

## 2021-12-12 ENCOUNTER — Ambulatory Visit
Admission: RE | Admit: 2021-12-12 | Discharge: 2021-12-12 | Disposition: A | Discharge status: Nursing Home (Includes ICF) | Payer: Medicaid Other | Attending: Gastroenterology | Admitting: Gastroenterology

## 2021-12-12 DIAGNOSIS — Z538 Procedure and treatment not carried out for other reasons: Secondary | ICD-10-CM | POA: Diagnosis not present

## 2021-12-12 DIAGNOSIS — Z1211 Encounter for screening for malignant neoplasm of colon: Secondary | ICD-10-CM | POA: Insufficient documentation

## 2021-12-12 SURGERY — COLONOSCOPY WITH PROPOFOL
Anesthesia: General

## 2021-12-12 MED ORDER — SODIUM CHLORIDE 0.9 % IV SOLN: INTRAVENOUS | Status: DC

## 2021-12-12 NOTE — OR Nursing (Signed)
Patient arrives not knowing what procedure he is having or why.  Not sure of date, month, year.  Not sure what his stool looks like.  Sisters state he has no POA.  Patient not consentable.  Procedure cancelled by Dr. Vicente Males,,

## 2021-12-12 NOTE — H&P (Signed)
GI note  Patient here for a colonoscopy, came in alone no one with him   He is not sure why he is here, doesn't know the year, month, name of hospital , president , not sure if he has had a bowel movement nor does he know purpose of him at the hospital  I do not believe he can consent himself at this time . He would need to return with someone who can consent for him legally .    Procedure cancelled  Dr Jonathon Bellows MD,MRCP [facility]) Gastroenterology/Hepatology Pager: [phone]

## 2021-12-12 NOTE — Progress Notes (Signed)
Letter created advising patient why we are unable to reschedule his colonoscopy at this time. Note was requested from patients care home [place].  Thanks,  [place], [place]

## 2021-12-12 NOTE — Telephone Encounter (Signed)
Doristine Counter RN at [facility] contacted office to discuss why patients colonoscopy was unable to be performed.  Informed Brandi patients colonoscopy was unable to be performed due to his altered mental state-confused.  [name] stated that patient was confused as a result of having low blood sugars.  I informed [name] that we can not perform a colonoscopy on a patient who is not mentally competent as.  patient was not aware of why he was at the hospital, what procedure he was there for.  Informed [name] that we can not perform patients colonoscopy unless he returns with someone who can legally consent for him.  Colonoscopy cannot be rescheduled until patient has someone to legally consent-Per Dr.Anna.  Brandi called office back to say that patients blood sugar was reported to be 60 upon his return to the assisted living.  I informed her that we will not be able to reschedule until patient has someone who can legally consent.  A letter was faxed to [facility] to advise of this as well.  Thanks,  [place], [place]

## 2021-12-21 ENCOUNTER — Other Ambulatory Visit: Payer: Self-pay | Admitting: Nurse Practitioner

## 2021-12-21 DIAGNOSIS — K862 Cyst of pancreas: Secondary | ICD-10-CM

## 2022-01-03 NOTE — Progress Notes (Deleted)
Cath Change/ Replacement  Patient is present today for a catheter change due to urinary retention.  ***ml of water was removed from the balloon, a ***FR foley cath was removed without difficulty.  Patient was cleaned and prepped in a sterile fashion with betadine and 2% lidocaine jelly was instilled into the urethra. A *** FR foley cath was replaced into the bladder, {dnt complications:20057}. Urine return was noted ***ml and urine was *** in color. The balloon was filled with 49ml of sterile water. A *** bag was attached for drainage.  A night bag was also given to the patient and patient was given instruction on how to change from one bag to another. Patient was given proper instruction on catheter care.    Performed by: ***  Follow up: *** Follow up cysto 2027.

## 2022-01-04 ENCOUNTER — Ambulatory Visit (INDEPENDENT_AMBULATORY_CARE_PROVIDER_SITE_OTHER): Payer: Medicaid Other | Admitting: Urology

## 2022-01-04 ENCOUNTER — Encounter: Payer: Medicaid Other | Admitting: Urology

## 2022-01-04 DIAGNOSIS — Z978 Presence of other specified devices: Secondary | ICD-10-CM

## 2022-01-04 DIAGNOSIS — R339 Retention of urine, unspecified: Secondary | ICD-10-CM | POA: Diagnosis not present

## 2022-01-04 NOTE — Progress Notes (Signed)
Cath Change/ Replacement  Patient is present today for a catheter change due to urinary retention.  8 ml of water was removed from the balloon, a 18 FR Coude foley cath was removed without difficulty.  Patient was cleaned and prepped in a sterile fashion with betadine and 2% lidocaine jelly was instilled into the urethra. A 18  FR Coude foley cath was replaced into the bladder, no complications were noted. Urine return was noted 20 ml and urine was yellow in color. The balloon was filled with 27ml of sterile water. A leg bag was attached for drainage.  Patient was given proper instruction on catheter care.    Performed by: Michiel Cowboy, PA-C   Follow up: One month follow up for catheter exchange.   Follow up cysto 2027.

## 2022-01-10 ENCOUNTER — Ambulatory Visit: Admission: RE | Admit: 2022-01-10 | Payer: Medicaid Other | Source: Ambulatory Visit

## 2022-01-24 ENCOUNTER — Ambulatory Visit: Admission: RE | Admit: 2022-01-24 | Payer: Medicaid Other | Source: Ambulatory Visit

## 2022-01-31 ENCOUNTER — Ambulatory Visit (INDEPENDENT_AMBULATORY_CARE_PROVIDER_SITE_OTHER): Payer: Medicaid Other | Admitting: Physician Assistant

## 2022-01-31 DIAGNOSIS — R339 Retention of urine, unspecified: Secondary | ICD-10-CM | POA: Diagnosis not present

## 2022-01-31 DIAGNOSIS — Z978 Presence of other specified devices: Secondary | ICD-10-CM

## 2022-01-31 NOTE — Progress Notes (Signed)
Cath Change/ Replacement  Patient is present today for a catheter change due to urinary retention.  52ml of water was removed from the balloon, a 18FR coude foley cath was removed without difficulty.  Patient was cleaned and prepped in a sterile fashion with betadine and 2% lidocaine jelly was instilled into the urethra. A 18 FR coude foley cath was replaced into the bladder, no complications were noted. Urine return was noted and urine was yellow in color. The balloon was filled with 70ml of sterile water. A leg bag was attached for drainage.  Patient tolerated well.    Performed by: Carman Ching, PA-C  Follow up: Return in about 4 weeks (around 02/28/2022) for Catheter exchange.

## 2022-02-01 ENCOUNTER — Ambulatory Visit: Payer: Medicaid Other | Admitting: Physician Assistant

## 2022-02-07 ENCOUNTER — Telehealth: Payer: Self-pay

## 2022-02-07 NOTE — Telephone Encounter (Signed)
Reschedule

## 2022-02-07 NOTE — Telephone Encounter (Signed)
Doristine Counter, RN at [facility] has requested to have Jay Flores's colonoscopy rescheduled.  She has provided HealthCare POA document indicating "Jay Flores" as as his [place].  Please advise on rescheduling patients colonoscopy.  And if rescheduled Jay Flores has to be physically present with him on the day of his colonoscopy.  Is this correct?  Thanks,  [place], [place]

## 2022-02-08 NOTE — Telephone Encounter (Signed)
Jay Mormon, RN to discuss rescheduling patients colonoscopy with Dr. Vicente Males.  I informed [name] that patients HCPOA must be present with patient during his colonoscopy.  Brandi provided me with the phone number to contact patients HCPOA [name] to confirm that she will be available to be with patient for his procedure. I left voice message asking her to call me or Brandi back to let us know if the date we are looking at will work.  The current date that we are looking to schedule patient on is Tues 02/27/22.  Procedure will be scheduled once this date has been [place] by both [place].  Will await call back. Once confirmed instructions will be faxed to [facility] and mailed.  [place] [phone]  Thanks,  Sharyn Lull, CMA

## 2022-02-21 ENCOUNTER — Ambulatory Visit
Admission: RE | Admit: 2022-02-21 | Discharge: 2022-02-21 | Disposition: A | Discharge status: Home / Self Care | Payer: Medicaid Other | Source: Ambulatory Visit | Attending: Nurse Practitioner | Admitting: Nurse Practitioner

## 2022-02-21 DIAGNOSIS — K862 Cyst of pancreas: Secondary | ICD-10-CM | POA: Diagnosis present

## 2022-02-21 MED ORDER — GADOBUTROL 1 MMOL/ML IV SOLN
5.0000 mL | Freq: Once | INTRAVENOUS | Status: AC | PRN
  Administered 2022-02-21: 5 mL via INTRAVENOUS

## 2022-03-01 ENCOUNTER — Encounter: Payer: Self-pay | Admitting: Podiatry

## 2022-03-01 ENCOUNTER — Ambulatory Visit: Payer: Medicaid Other | Admitting: Podiatry

## 2022-03-01 VITALS — BP 138/79 | HR 57

## 2022-03-01 DIAGNOSIS — M79674 Pain in right toe(s): Secondary | ICD-10-CM

## 2022-03-01 DIAGNOSIS — B351 Tinea unguium: Secondary | ICD-10-CM | POA: Diagnosis not present

## 2022-03-01 DIAGNOSIS — M79675 Pain in left toe(s): Secondary | ICD-10-CM

## 2022-03-01 DIAGNOSIS — E1142 Type 2 diabetes mellitus with diabetic polyneuropathy: Secondary | ICD-10-CM | POA: Diagnosis not present

## 2022-03-01 DIAGNOSIS — L84 Corns and callosities: Secondary | ICD-10-CM

## 2022-03-01 NOTE — Progress Notes (Incomplete)
Cath Change/ Replacement  Patient is present today for a catheter change due to urinary retention.  ***ml of water was removed from the balloon, a ***FR foley cath was removed without difficulty.  Patient was cleaned and prepped in a sterile fashion with betadine and 2% lidocaine jelly was instilled into the urethra. A *** FR foley cath was replaced into the bladder, {dnt complications:20057}. Urine return was noted ***ml and urine was *** in color. The balloon was filled with 10ml of sterile water. A *** bag was attached for drainage.  A night bag was also given to the patient and patient was given instruction on how to change from one bag to another. Patient was given proper instruction on catheter care.    Performed by: ***  Follow up: ***  

## 2022-03-01 NOTE — Progress Notes (Addendum)
This patient returns to my office for at risk foot care.  This patient requires this care by a professional since this patient will be at risk due to having diabetes and kidney disease.  Pt has painful callus left foot when walking.This patient is unable to cut nails himself since the patient cannot reach his nails.These nails are painful walking and wearing shoes.  This patient presents for at risk foot care today.  General Appearance  Alert, conversant and in no acute stress.  Vascular  Dorsalis pedis and posterior tibial  pulses are palpable  bilaterally.  Capillary return is within normal limits  bilaterally. Temperature is within normal limits  bilaterally.  Neurologic  Senn-Weinstein monofilament wire test diminished  bilaterally. Muscle power within normal limits bilaterally.  Nails Thick disfigured discolored nails with subungual debris  from hallux to fifth toes bilaterally. No evidence of bacterial infection or drainage bilaterally.  Orthopedic  No limitations of motion  feet .  No crepitus or effusions noted.  No bony pathology or digital deformities noted.  Skin  normotropic skin noted bilaterally.  No signs of infections or ulcers noted.   Porokeratosis sub 5 B/L.  Onychomycosis  Pain in right toes  Pain in left toes  Porokeratosis left foot.  Consent was obtained for treatment procedures.   Mechanical debridement of nails 1-5  bilaterally performed with a nail nipper.  Filed with dremel without incident. Debridement of callus with # 15 blade. Callus were  done as a couresy.   Return office visit  3   months                    Told patient to return for periodic foot care and evaluation due to potential at risk complications.   Gardiner Barefoot DPM

## 2022-03-02 ENCOUNTER — Ambulatory Visit: Payer: Medicaid Other | Admitting: Urology

## 2022-03-02 DIAGNOSIS — Z978 Presence of other specified devices: Secondary | ICD-10-CM

## 2022-03-10 ENCOUNTER — Encounter: Payer: Self-pay | Admitting: Emergency Medicine

## 2022-03-10 ENCOUNTER — Other Ambulatory Visit: Payer: Self-pay

## 2022-03-10 ENCOUNTER — Emergency Department
Admission: EM | Admit: 2022-03-10 | Discharge: 2022-03-10 | Disposition: A | Discharge status: Home / Self Care | Payer: Medicaid Other | Attending: Emergency Medicine | Admitting: Emergency Medicine

## 2022-03-10 DIAGNOSIS — N189 Chronic kidney disease, unspecified: Secondary | ICD-10-CM | POA: Insufficient documentation

## 2022-03-10 DIAGNOSIS — R1111 Vomiting without nausea: Secondary | ICD-10-CM | POA: Insufficient documentation

## 2022-03-10 LAB — COMPREHENSIVE METABOLIC PANEL
ALT: 17 U/L (ref 0–44)
AST: 17 U/L (ref 15–41)
Albumin: 3.7 g/dL (ref 3.5–5.0)
Alkaline Phosphatase: 95 U/L (ref 38–126)
Anion gap: 6 (ref 5–15)
BUN: 33 mg/dL — ABNORMAL HIGH (ref 6–20)
CO2: 22 mmol/L (ref 22–32)
Calcium: 8.8 mg/dL — ABNORMAL LOW (ref 8.9–10.3)
Chloride: 109 mmol/L (ref 98–111)
Creatinine, Ser: 2.93 mg/dL — ABNORMAL HIGH (ref 0.61–1.24)
GFR, Estimated: 24 mL/min — ABNORMAL LOW (ref >60)
Glucose, Bld: 140 mg/dL — ABNORMAL HIGH (ref 70–99)
Potassium: 4.3 mmol/L (ref 3.5–5.1)
Sodium: 137 mmol/L (ref 135–145)
Total Bilirubin: 0.8 mg/dL (ref 0.3–1.2)
Total Protein: 7.1 g/dL (ref 6.5–8.1)

## 2022-03-10 LAB — CBC
HCT: 41 % (ref 39.0–52.0)
Hemoglobin: 13.8 g/dL (ref 13.0–17.0)
MCH: 31.7 pg (ref 26.0–34.0)
MCHC: 33.7 g/dL (ref 30.0–36.0)
MCV: 94 fL (ref 80.0–100.0)
Platelets: 115 10*3/uL — ABNORMAL LOW (ref 150–400)
RBC: 4.36 MIL/uL (ref 4.22–5.81)
RDW: 12.2 % (ref 11.5–15.5)
WBC: 6.1 10*3/uL (ref 4.0–10.5)
nRBC: 0 % (ref 0.0–0.2)

## 2022-03-10 LAB — LIPASE, BLOOD: Lipase: 24 U/L (ref 11–51)

## 2022-03-10 NOTE — ED Triage Notes (Signed)
Patient to ED via [facility] from group home for one episode of vomiting earlier today. Patient states no complaints at this time.

## 2022-03-10 NOTE — ED Notes (Signed)
Reports vomited today after eating. Pt from [facility] home per EMS report. EMS reports pt states he feels "fine" at this time but wanted to be evaluated.

## 2022-03-10 NOTE — ED Provider Notes (Addendum)
[facility] Provider Note    Event Date/Time   First MD Initiated Contact with Patient 03/10/22 1527     (approximate)   History   Emesis   HPI  Jay Flores is a 62 y.o. male with a history of chronic kidney disease who presents for evaluation after an episode of vomiting.  Patient apparently vomited after eating this morning, since then he has not vomited any further and he is felt well.  He denies any abdominal pain.  No diarrhea.  No fevers.  In fact he is hungry and requesting a sandwich     Physical Exam   Triage Vital Signs: ED Triage Vitals  Enc Vitals Group     BP 03/10/22 1509 99/64     Pulse Rate 03/10/22 1509 (!) 55     Resp 03/10/22 1509 18     Temp 03/10/22 1509 97.7 F (36.5 C)     Temp Source 03/10/22 1509 Oral     SpO2 03/10/22 1509 100 %     Weight --      Height --      Head Circumference --      Peak Flow --      Pain Score 03/10/22 1510 0     Pain Loc --      Pain Edu? --      Excl. in [place]? --     Most recent vital signs: Vitals:   03/10/22 1606 03/10/22 1607  BP: 115/85 115/85  Pulse:  (!) 56  Resp:  16  Temp:    SpO2:  100%     General: Awake, no distress.  CV:  Good peripheral perfusion.  Resp:  Normal effort.  Abd:  No distention.  Soft, nontender Other:     ED Results / Procedures / Treatments   Labs (all labs ordered are listed, but only abnormal results are displayed) Labs Reviewed  COMPREHENSIVE METABOLIC PANEL - Abnormal; Notable for the following components:      Result Value   Glucose, Bld 140 (*)    BUN 33 (*)    Creatinine, Ser 2.93 (*)    Calcium 8.8 (*)    GFR, Estimated 24 (*)    All other components within normal limits  CBC - Abnormal; Notable for the following components:   Platelets 115 (*)    All other components within normal limits  LIPASE, BLOOD     EKG     RADIOLOGY     PROCEDURES:  Critical Care performed:   Procedures   MEDICATIONS ORDERED IN  ED: Medications - No data to display   IMPRESSION / MDM / [place] / ED COURSE  I reviewed the triage vital signs and the nursing notes. Patient's presentation is most consistent with acute complicated illness / injury requiring diagnostic workup.  Patient presents after an episode of nausea as detailed above, however symptoms have resolved and he is comfortable and well-appearing here.  He is requesting a sandwich because he is hungry.  Lab work reviewed, patient has a history of chronic kidney disease, his creatinine is elevated but in line with prior levels, CBC is normal.  Indication for further workup at this time, appropriate for discharge return precautions discussed      FINAL CLINICAL IMPRESSION(S) / ED DIAGNOSES   Final diagnoses:  Vomiting without nausea, unspecified vomiting type  Chronic kidney disease, unspecified CKD stage     Rx / DC Orders   ED Discharge Orders  None        Note:  This document was prepared using Dragon voice recognition software and may include unintentional dictation errors.   Lavonia Drafts, MD 03/10/22 Christy Sartorius    Lavonia Drafts, MD 03/10/22 [phone]

## 2022-03-14 ENCOUNTER — Ambulatory Visit (INDEPENDENT_AMBULATORY_CARE_PROVIDER_SITE_OTHER): Payer: Medicaid Other | Admitting: Physician Assistant

## 2022-03-14 DIAGNOSIS — Z466 Encounter for fitting and adjustment of urinary device: Secondary | ICD-10-CM | POA: Diagnosis not present

## 2022-03-14 DIAGNOSIS — R339 Retention of urine, unspecified: Secondary | ICD-10-CM

## 2022-03-14 NOTE — Progress Notes (Signed)
Cath Change/ Replacement  Patient is present today for a catheter change due to urinary retention.  8 ml of water was removed from the balloon, a 18 FR coude foley cath was removed without difficulty.  Patient was cleaned and prepped in a sterile fashion with betadine and 2% lidocaine jelly was instilled into the urethra. A 18 FR coude foley cath was replaced into the bladder, no complications were noted. Urine return was noted 120 ml and urine was yellow in color. The balloon was filled with 51ml of sterile water. A leg bag was attached for drainage.  A night bag was also given to the patient and patient was given instruction on how to change from one bag to another. Patient was given proper instruction on catheter care.    Performed by: Markus Jarvis and Carrie G  Follow up: 1 month 04/11/22

## 2022-03-22 ENCOUNTER — Telehealth: Payer: Self-pay

## 2022-03-22 NOTE — Telephone Encounter (Signed)
Courtesy call made to pt. Pt was sleeping so spoke with staff from care center. Informed them of appt details, clinic location and mask and visitor policy. She said a staff member will be coming with pt to appt tomorrow.

## 2022-03-23 ENCOUNTER — Inpatient Hospital Stay: Payer: Medicaid Other

## 2022-03-23 ENCOUNTER — Encounter: Payer: Self-pay | Admitting: Oncology

## 2022-03-23 ENCOUNTER — Telehealth: Payer: Self-pay | Admitting: Oncology

## 2022-03-23 ENCOUNTER — Inpatient Hospital Stay: Payer: Medicaid Other | Attending: Oncology | Admitting: Oncology

## 2022-03-23 VITALS — BP 135/79 | HR 50 | Temp 96.5°F | Resp 18 | Wt 136.3 lb

## 2022-03-23 DIAGNOSIS — N183 Chronic kidney disease, stage 3 unspecified: Secondary | ICD-10-CM | POA: Insufficient documentation

## 2022-03-23 DIAGNOSIS — K862 Cyst of pancreas: Secondary | ICD-10-CM

## 2022-03-23 DIAGNOSIS — Z21 Asymptomatic human immunodeficiency virus [HIV] infection status: Secondary | ICD-10-CM | POA: Insufficient documentation

## 2022-03-23 DIAGNOSIS — D696 Thrombocytopenia, unspecified: Secondary | ICD-10-CM | POA: Insufficient documentation

## 2022-03-23 DIAGNOSIS — N1832 Chronic kidney disease, stage 3b: Secondary | ICD-10-CM | POA: Insufficient documentation

## 2022-03-23 DIAGNOSIS — Z720 Tobacco use: Secondary | ICD-10-CM | POA: Diagnosis not present

## 2022-03-23 DIAGNOSIS — I129 Hypertensive chronic kidney disease with stage 1 through stage 4 chronic kidney disease, or unspecified chronic kidney disease: Secondary | ICD-10-CM | POA: Insufficient documentation

## 2022-03-23 DIAGNOSIS — E1122 Type 2 diabetes mellitus with diabetic chronic kidney disease: Secondary | ICD-10-CM | POA: Diagnosis not present

## 2022-03-23 LAB — CMP (CANCER CENTER ONLY)
ALT: 28 U/L (ref 0–44)
AST: 22 U/L (ref 15–41)
Albumin: 4 g/dL (ref 3.5–5.0)
Alkaline Phosphatase: 112 U/L (ref 38–126)
Anion gap: 6 (ref 5–15)
BUN: 21 mg/dL — ABNORMAL HIGH (ref 6–20)
CO2: 22 mmol/L (ref 22–32)
Calcium: 8.8 mg/dL — ABNORMAL LOW (ref 8.9–10.3)
Chloride: 108 mmol/L (ref 98–111)
Creatinine: 2.35 mg/dL — ABNORMAL HIGH (ref 0.61–1.24)
GFR, Estimated: 31 mL/min — ABNORMAL LOW (ref >60)
Glucose, Bld: 75 mg/dL (ref 70–99)
Potassium: 4.8 mmol/L (ref 3.5–5.1)
Sodium: 136 mmol/L (ref 135–145)
Total Bilirubin: 0.8 mg/dL (ref 0.3–1.2)
Total Protein: 7.3 g/dL (ref 6.5–8.1)

## 2022-03-23 LAB — CBC WITH DIFFERENTIAL (CANCER CENTER ONLY)
Abs Immature Granulocytes: 0.02 10*3/uL (ref 0.00–0.07)
Basophils Absolute: 0 10*3/uL (ref 0.0–0.1)
Basophils Relative: 0 %
Eosinophils Absolute: 0.1 10*3/uL (ref 0.0–0.5)
Eosinophils Relative: 1 %
HCT: 42.1 % (ref 39.0–52.0)
Hemoglobin: 13.9 g/dL (ref 13.0–17.0)
Immature Granulocytes: 0 %
Lymphocytes Relative: 53 %
Lymphs Abs: 2.8 10*3/uL (ref 0.7–4.0)
MCH: 31.7 pg (ref 26.0–34.0)
MCHC: 33 g/dL (ref 30.0–36.0)
MCV: 95.9 fL (ref 80.0–100.0)
Monocytes Absolute: 0.3 10*3/uL (ref 0.1–1.0)
Monocytes Relative: 6 %
Neutro Abs: 2.1 10*3/uL (ref 1.7–7.7)
Neutrophils Relative %: 40 %
Platelet Count: 125 10*3/uL — ABNORMAL LOW (ref 150–400)
RBC: 4.39 MIL/uL (ref 4.22–5.81)
RDW: 12.2 % (ref 11.5–15.5)
WBC Count: 5.3 10*3/uL (ref 4.0–10.5)
nRBC: 0 % (ref 0.0–0.2)

## 2022-03-23 NOTE — Assessment & Plan Note (Signed)
.  yuckd

## 2022-03-23 NOTE — Progress Notes (Signed)
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

## 2022-03-23 NOTE — Assessment & Plan Note (Signed)
Chronic.  Close to baseline.  Continue monitor.

## 2022-03-23 NOTE — Telephone Encounter (Signed)
Theadora Rama rn at [place] care [facility] [phone]   Wants to be called when the appt for the followup after the PET is scheduled

## 2022-03-23 NOTE — Progress Notes (Signed)
Hematology/Oncology Consult Note [phone] [phone]     REFERRING PROVIDER: Terrill Mohr, NP    CHIEF COMPLAINTS/PURPOSE OF CONSULTATION:  Pancreatic cyst  ASSESSMENT & PLAN:   Pancreatic cyst Imaging findings were reviewed and discussed with patient.-Large, multiseptated cystic pancreatic lesion Recommend to check CA 19-9.  Check PET scan for evaluation Refer to GI for EUS biopsy.  Tobacco use Smoke cessation was discussed with patient.  CKD (chronic kidney disease) stage 3, GFR 30-59 ml/min (HCC) .yuckd  Thrombocytopenia ([place]) Chronic.  Close to baseline.  Continue monitor.   Orders Placed This Encounter  Procedures   NM PET Image Initial (PI) Skull Base To Thigh    Standing Status:   Future    Standing Expiration Date:   03/23/2023    Order Specific Question:   If indicated for the ordered procedure, I authorize the administration of a radiopharmaceutical per Radiology protocol    Answer:   Yes    Order Specific Question:   Preferred imaging location?    Answer:   [facility]   Cancer antigen 19-9    Standing Status:   Future    Number of Occurrences:   1    Standing Expiration Date:   03/23/2023   CBC with Differential ([place] Only)    Standing Status:   Future    Number of Occurrences:   1    Standing Expiration Date:   03/24/2023   CMP ([place] only)    Standing Status:   Future    Number of Occurrences:   1    Standing Expiration Date:   03/24/2023   Follow-up TBD All questions were answered. The patient knows to call the clinic with any problems, questions or concerns.  Earlie Server, MD, PhD [facility] Hematology Oncology 03/23/2022    HISTORY OF PRESENTING ILLNESS:  Jay Flores 62 y.o. male presents to establish care for pancreatic cyst  Patient has a history of pancreatic cyst. 02/21/2022 MRI abdomen with and without contrast showed 1. Large, multiseptated cystic lesion occupying the majority of  the pancreatic head and uncinate, measuring 7.6 x 5.7 x 4.6 cm. This may reflect a large, complex pseudocyst or cystic pancreatic neoplasm. No obvious significant solid component or contrast enhancement. Given large size recommend EUS/FNA for definitive diagnosis. 2. Severely atrophic pancreatic parenchyma with diffuse dilatation along the length of the pancreatic duct measuring up to 0.9 cm in caliber. No acute inflammatory findings. 3. Cholelithiasis. 4. Large burden of stool in the included colon.   Patient has a history of HIV, CKD stage IIIb, hypertension, diabetes.  BPH with lower urinary tract symptoms. 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  History of HIV dementia.   Hep C - s/p Harvoni for 12 weeks treatment  Patient reports balancing problem.  He uses wheelchair.  He lives in a group home. Patient denies any current alcohol use.  He is a daily smoker.  MEDICAL HISTORY:  Past Medical History:  Diagnosis Date   Acute renal failure ([place])    Altered mental status 01/20/2012   Diabetes mellitus without complication (HCC)    HIV (human immunodeficiency virus infection) ([place])    Hypertension     SURGICAL HISTORY: Past Surgical History:  Procedure Laterality Date   NO PAST SURGERIES      SOCIAL HISTORY: Social History   Socioeconomic History   Marital status: Single    Spouse name: Not on file   Number of children: Not  on file   Years of education: Not on file   Highest education level: Not on file  Occupational History   Occupation: disabled  Tobacco Use   Smoking status: Every Day    Packs/day: 1.00    Years: 34.00    Total pack years: 34.00    Types: Cigarettes   Smokeless tobacco: Never  Vaping Use   Vaping Use: Never used  Substance and Sexual Activity   Alcohol use: No   Drug use: Not Currently    Types: Cocaine    Comment: 01/22/2012 "last used cocaine ~ 2012"   Sexual activity: Never  Other Topics Concern   Not on file   Social History Narrative   Not on file   Social Determinants of Health   Financial Resource Strain: Not on file  Food Insecurity: No Food Insecurity (03/23/2022)   Hunger Vital Sign    Worried About Running Out of Food in the Last Year: Never true    Ran Out of Food in the Last Year: Never true  Transportation Needs: No Transportation Needs (03/23/2022)   PRAPARE - Hydrologist (Medical): No    Lack of Transportation (Non-Medical): No  Physical Activity: Not on file  Stress: Not on file  Social Connections: Not on file  Intimate Partner Violence: Not At Risk (03/23/2022)   Humiliation, Afraid, Rape, and Kick questionnaire    Fear of Current or Ex-Partner: No    Emotionally Abused: No    Physically Abused: No    Sexually Abused: No    FAMILY HISTORY: Family History  Problem Relation Age of Onset   Diabetes Mellitus II Mother     ALLERGIES:  has No Known Allergies.  MEDICATIONS:  Current Outpatient Medications  Medication Sig Dispense Refill   Accu-Chek Softclix Lancets lancets by Other route. Use as instructed     acetaminophen (TYLENOL 8 HOUR ARTHRITIS PAIN) 650 MG CR tablet Take 650 mg by mouth every 8 (eight) hours as needed for pain.     acetaminophen (TYLENOL) 325 MG tablet Take 650 mg by mouth every 4 (four) hours as needed.     atorvastatin (LIPITOR) 20 MG tablet Take 20 mg by mouth daily.     Cholecalciferol (VITAMIN D3) 25 MCG (1000 UT) CAPS Take 1 capsule by mouth daily.     Continuous Blood Gluc Receiver (FREESTYLE LIBRE 14 DAY READER) DEVI Apply topically.     Continuous Blood Gluc Sensor (FREESTYLE LIBRE 14 DAY SENSOR) MISC Apply topically.     Dolutegravir-Rilpivirine (JULUCA) 50-25 MG TABS Take 1 tablet by mouth daily.     finasteride (PROSCAR) 5 MG tablet Take 5 mg by mouth daily.     glucose blood (ACCU-CHEK AVIVA PLUS) test strip 1 each by Other route in the morning, at noon, and at bedtime. Use as instructed     insulin aspart  protamine- aspart (NOVOLOG MIX 70/30) (70-30) 100 UNIT/ML injection Inject 14 Units into the skin 2 (two) times daily with a meal.     Insulin Pen Needle (ULTICARE MICRO PEN NEEDLES) 31G X 8 MM MISC by Does not apply route. As needed     loperamide (IMODIUM) 2 MG capsule Take 2 mg by mouth in the morning and at bedtime.     losartan (COZAAR) 100 MG tablet Take 100 mg by mouth daily.     metoprolol succinate (TOPROL-XL) 100 MG 24 hr tablet Take 150 mg by mouth daily.     Sodium Bicarbonate-Citric Acid  1940-1000 MG TBEF Take 1 tablet by mouth in the morning and at bedtime.     No current facility-administered medications for this visit.    Review of Systems  Constitutional:  Negative for appetite change, chills, fatigue, fever and unexpected weight change.  HENT:   Negative for hearing loss and voice change.   Eyes:  Negative for eye problems and icterus.  Respiratory:  Negative for chest tightness, cough and shortness of breath.   Cardiovascular:  Negative for chest pain and leg swelling.  Gastrointestinal:  Negative for abdominal distention, abdominal pain, nausea and vomiting.  Endocrine: Negative for hot flashes.  Genitourinary:  Positive for frequency. Negative for difficulty urinating and dysuria.   Musculoskeletal:  Positive for gait problem. Negative for arthralgias.  Skin:  Negative for itching and rash.  Neurological:  Positive for gait problem. Negative for light-headedness and numbness.  Hematological:  Negative for adenopathy. Does not bruise/bleed easily.  Psychiatric/Behavioral:  Negative for confusion.      PHYSICAL EXAMINATION: ECOG PERFORMANCE STATUS: 2 - Symptomatic, <50% confined to bed  Vitals:   03/23/22 1052  BP: 135/79  Pulse: (!) 50  Resp: 18  Temp: (!) 96.5 F (35.8 C)   Filed Weights   03/23/22 1052  Weight: 136 lb 4.8 oz (61.8 kg)    Physical Exam Constitutional:      General: He is not in acute distress.    Appearance: He is not diaphoretic.      Comments: Patient sits in the wheelchair.  HENT:     Head: Normocephalic and atraumatic.     Nose: Nose normal.     Mouth/Throat:     Pharynx: No oropharyngeal exudate.  Eyes:     General: No scleral icterus.    Pupils: Pupils are equal, round, and reactive to light.  Cardiovascular:     Rate and Rhythm: Normal rate.     Heart sounds: No murmur heard. Pulmonary:     Effort: Pulmonary effort is normal. No respiratory distress.  Abdominal:     General: There is no distension.     Palpations: Abdomen is soft.     Tenderness: There is no abdominal tenderness.  Musculoskeletal:        General: Normal range of motion.     Cervical back: Normal range of motion and neck supple.  Skin:    General: Skin is warm and dry.     Findings: No erythema.  Neurological:     Mental Status: He is alert. Mental status is at baseline.     Cranial Nerves: No cranial nerve deficit.     Motor: No abnormal muscle tone.     Comments: Patient answers questions appropriately  Psychiatric:        Mood and Affect: Mood and affect normal.      LABORATORY DATA:  I have reviewed the data as listed    Latest Ref Rng & Units 03/23/2022   11:32 AM 03/10/2022    3:11 PM 05/25/2017    4:18 PM  CBC  WBC 4.0 - 10.5 K/uL 5.3  6.1  6.1   Hemoglobin 13.0 - 17.0 g/dL 13.9  13.8  11.0   Hematocrit 39.0 - 52.0 % 42.1  41.0  33.5   Platelets 150 - 400 K/uL 125  115  167       Latest Ref Rng & Units 03/23/2022   11:32 AM 03/10/2022    3:11 PM 05/25/2017    4:18 PM  CMP  Glucose 70 -  99 mg/dL 75  140  152   BUN 6 - 20 mg/dL 21  33  23   Creatinine 0.61 - 1.24 mg/dL 2.35  2.93  1.99   Sodium 135 - 145 mmol/L 136  137  140   Potassium 3.5 - 5.1 mmol/L 4.8  4.3  4.3   Chloride 98 - 111 mmol/L 108  109  114   CO2 22 - 32 mmol/L 22  22  18   $ Calcium 8.9 - 10.3 mg/dL 8.8  8.8  9.0   Total Protein 6.5 - 8.1 g/dL 7.3  7.1  7.3   Total Bilirubin 0.3 - 1.2 mg/dL 0.8  0.8  0.4   Alkaline Phos 38 - 126 U/L 112  95  126    AST 15 - 41 U/L 22  17  21   $ ALT 0 - 44 U/L 28  17  21      $ RADIOGRAPHIC STUDIES: I have personally reviewed the radiological images as listed and agreed with the findings in the report. No results found.

## 2022-03-23 NOTE — Assessment & Plan Note (Signed)
Smoke cessation was discussed with patient.

## 2022-03-23 NOTE — Assessment & Plan Note (Signed)
Imaging findings were reviewed and discussed with patient.-Large, multiseptated cystic pancreatic lesion Recommend to check CA 19-9.  Check PET scan for evaluation Refer to GI for EUS biopsy.

## 2022-03-24 LAB — CANCER ANTIGEN 19-9: CA 19-9: 10 U/mL (ref 0–35)

## 2022-04-06 ENCOUNTER — Telehealth: Payer: Self-pay | Admitting: Oncology

## 2022-04-06 NOTE — Telephone Encounter (Signed)
spoke with caregiver Yvetta Coder and notified her of cancellation of PET, She confirmed

## 2022-04-09 ENCOUNTER — Other Ambulatory Visit: Payer: Self-pay

## 2022-04-09 ENCOUNTER — Telehealth: Payer: Self-pay

## 2022-04-09 NOTE — Telephone Encounter (Signed)
Called and spoke with [name], Scientist, physiological at [facility]. Confirmed that Mr. Spires continues to sign his consent forms and that HCPOA is only a backup. He is alert and oriented per facility. EUS has been scheduled for April 19, 2022. Instructions have been faxed to [facility]. She was made aware that if he cannot sign his consent that his HCPOA will need to be present.

## 2022-04-10 ENCOUNTER — Ambulatory Visit: Payer: Medicaid Other

## 2022-04-11 ENCOUNTER — Ambulatory Visit (INDEPENDENT_AMBULATORY_CARE_PROVIDER_SITE_OTHER): Payer: Medicaid Other | Admitting: Physician Assistant

## 2022-04-11 VITALS — BP 97/64 | HR 59

## 2022-04-11 DIAGNOSIS — Z466 Encounter for fitting and adjustment of urinary device: Secondary | ICD-10-CM | POA: Diagnosis not present

## 2022-04-11 DIAGNOSIS — R339 Retention of urine, unspecified: Secondary | ICD-10-CM | POA: Diagnosis not present

## 2022-04-11 NOTE — Progress Notes (Signed)
Cath Change/ Replacement  Patient is present today for a catheter change due to urinary retention.  90m of water was removed from the balloon, a 18FR coude foley cath was removed without difficulty.  Patient was cleaned and prepped in a sterile fashion with betadine and 2% lidocaine jelly was instilled into the urethra. A 18 FR coude foley cath was replaced into the bladder, no complications were noted. Urine return was noted 247mand urine was yellow in color. The balloon was filled with 1055mf sterile water. A leg bag was attached for drainage.  Patient tolerated well.    Performed by: SamDebroah LoopA-C   Follow up: Return in about 4 weeks (around 05/09/2022) for Catheter exchange.

## 2022-04-12 ENCOUNTER — Other Ambulatory Visit: Payer: Self-pay

## 2022-04-13 ENCOUNTER — Other Ambulatory Visit: Payer: Self-pay | Admitting: Family Medicine

## 2022-04-13 DIAGNOSIS — Z8619 Personal history of other infectious and parasitic diseases: Secondary | ICD-10-CM

## 2022-04-18 ENCOUNTER — Encounter: Payer: Self-pay | Admitting: *Deleted

## 2022-04-19 ENCOUNTER — Encounter
Admission: RE | Disposition: A | Discharge status: Home / Self Care | Payer: Self-pay | Source: Ambulatory Visit | Attending: Gastroenterology

## 2022-04-19 ENCOUNTER — Encounter: Payer: Self-pay | Admitting: *Deleted

## 2022-04-19 ENCOUNTER — Other Ambulatory Visit: Payer: Self-pay

## 2022-04-19 ENCOUNTER — Ambulatory Visit: Payer: Medicaid Other | Admitting: Certified Registered"

## 2022-04-19 ENCOUNTER — Ambulatory Visit
Admission: RE | Admit: 2022-04-19 | Discharge: 2022-04-19 | Disposition: A | Discharge status: Home / Self Care | Payer: Medicaid Other | Source: Ambulatory Visit | Attending: Gastroenterology | Admitting: Gastroenterology

## 2022-04-19 ENCOUNTER — Other Ambulatory Visit: Payer: Medicaid Other

## 2022-04-19 ENCOUNTER — Other Ambulatory Visit: Payer: Self-pay | Admitting: Gastroenterology

## 2022-04-19 DIAGNOSIS — K862 Cyst of pancreas: Secondary | ICD-10-CM | POA: Insufficient documentation

## 2022-04-19 DIAGNOSIS — N289 Disorder of kidney and ureter, unspecified: Secondary | ICD-10-CM | POA: Insufficient documentation

## 2022-04-19 DIAGNOSIS — I10 Essential (primary) hypertension: Secondary | ICD-10-CM | POA: Insufficient documentation

## 2022-04-19 DIAGNOSIS — Z794 Long term (current) use of insulin: Secondary | ICD-10-CM | POA: Insufficient documentation

## 2022-04-19 DIAGNOSIS — F17200 Nicotine dependence, unspecified, uncomplicated: Secondary | ICD-10-CM | POA: Insufficient documentation

## 2022-04-19 DIAGNOSIS — F0390 Unspecified dementia without behavioral disturbance: Secondary | ICD-10-CM | POA: Diagnosis not present

## 2022-04-19 DIAGNOSIS — D696 Thrombocytopenia, unspecified: Secondary | ICD-10-CM | POA: Insufficient documentation

## 2022-04-19 DIAGNOSIS — E119 Type 2 diabetes mellitus without complications: Secondary | ICD-10-CM | POA: Insufficient documentation

## 2022-04-19 HISTORY — PX: EUS: SHX5427

## 2022-04-19 LAB — GLUCOSE, CAPILLARY: Glucose-Capillary: 105 mg/dL — ABNORMAL HIGH (ref 70–99)

## 2022-04-19 SURGERY — UPPER ENDOSCOPIC ULTRASOUND (EUS) LINEAR
Anesthesia: General

## 2022-04-19 MED ORDER — LIDOCAINE HCL (CARDIAC) PF 100 MG/5ML IV SOSY
PREFILLED_SYRINGE | INTRAVENOUS | Status: DC | PRN
  Administered 2022-04-19: 100 mg via INTRAVENOUS

## 2022-04-19 MED ORDER — PROPOFOL 500 MG/50ML IV EMUL
INTRAVENOUS | Status: DC | PRN
  Administered 2022-04-19: 165 ug/kg/min via INTRAVENOUS

## 2022-04-19 MED ORDER — CIPROFLOXACIN IN D5W 400 MG/200ML IV SOLN
400.0000 mg | Freq: Once | INTRAVENOUS | Status: AC
  Administered 2022-04-19: 400 mg via INTRAVENOUS

## 2022-04-19 MED ORDER — GLYCOPYRROLATE 0.2 MG/ML IJ SOLN
INTRAMUSCULAR | Status: DC | PRN
  Administered 2022-04-19: .2 mg via INTRAVENOUS

## 2022-04-19 MED ORDER — CIPROFLOXACIN IN D5W 400 MG/200ML IV SOLN
INTRAVENOUS | Status: AC
  Filled 2022-04-19: qty 200

## 2022-04-19 MED ORDER — SODIUM CHLORIDE 0.9 % IV SOLN: INTRAVENOUS | Status: DC

## 2022-04-19 MED ORDER — PROPOFOL 10 MG/ML IV BOLUS
INTRAVENOUS | Status: DC | PRN
  Administered 2022-04-19: 60 mg via INTRAVENOUS
  Administered 2022-04-19: 20 mg via INTRAVENOUS
  Administered 2022-04-19: 30 mg via INTRAVENOUS
  Administered 2022-04-19 (×2): 20 mg via INTRAVENOUS

## 2022-04-19 NOTE — H&P (Signed)
PRE-PROCEDURE HISTORY AND PHYSICAL   Jay Flores presents for his scheduled Procedure(s): UPPER ENDOSCOPIC ULTRASOUND (EUS) LINEAR.  The indication for the procedure(s) is pancreas cyst.  There have been no significant recent changes in the patient's medical status.  Past Medical History:  Diagnosis Date   Acute renal failure (HCC)    Altered mental status 01/20/2012   Diabetes mellitus without complication (HCC)    HIV (human immunodeficiency virus infection) ([place])    Hypertension     Past Surgical History:  Procedure Laterality Date   NO PAST SURGERIES      Allergies No Known Allergies  Medications Accu-Chek Softclix Lancets, Cholecalciferol, FreeStyle Libre 14 Day Reader, FreeStyle Libre 14 Day Sensor, Insulin Pen Needle, Sodium Bicarbonate-Citric Acid, acetaminophen, atorvastatin, dolutegravir-rilpivirine, finasteride, glucose blood, insulin aspart protamine- aspart, loperamide, losartan, and metoprolol succinate  Physical Examination  Body mass index is 20.62 kg/m. BP 132/80   Pulse (!) 43   Temp (!) 96.5 F (35.8 C) (Temporal)   Resp 18   Ht '5\' 8"'$  (1.727 m)   Wt 61.5 kg   SpO2 100%   BMI 20.62 kg/m  General:   Alert,  pleasant and cooperative in NAD Head:  Normocephalic and atraumatic. Neck:  Supple; no masses or thyromegaly. Lungs:  Clear throughout to auscultation.    Heart:  Regular rate and rhythm. Abdomen:  Soft, nontender and nondistended. Normal bowel sounds, without guarding, and without rebound.   Neurologic:  Alert  ASSESSMENT AND PLAN  Jay Flores has been evaluated and deemed appropriate to undergo the planned Procedure(s): UPPER ENDOSCOPIC ULTRASOUND (EUS) LINEAR.

## 2022-04-19 NOTE — Anesthesia Procedure Notes (Signed)
Procedure Name: General with mask airway Date/Time: 04/19/2022 1:07 PM  Performed by: Kelton Pillar, CRNAPre-anesthesia Checklist: Patient identified, Emergency Drugs available, Suction available and Patient being monitored Patient Re-evaluated:Patient Re-evaluated prior to induction Oxygen Delivery Method: Simple face mask Induction Type: IV induction Placement Confirmation: positive ETCO2, CO2 detector and breath sounds checked- equal and bilateral Dental Injury: Teeth and Oropharynx as per pre-operative assessment

## 2022-04-19 NOTE — Transfer of Care (Signed)
Immediate Anesthesia Transfer of Care Note  Patient: Jay Flores  Procedure(s) Performed: UPPER ENDOSCOPIC ULTRASOUND (EUS) LINEAR  Patient Location: Endoscopy Unit  Anesthesia Type:General  Level of Consciousness: drowsy and patient cooperative  Airway & Oxygen Therapy: Patient Spontanous Breathing and Patient connected to face mask oxygen  Post-op Assessment: Report given to RN and Post -op Vital signs reviewed and stable  Post vital signs: Reviewed and stable  Last Vitals:  Vitals Value Taken Time  BP 110/72 04/19/22 1336  Temp 35.9 C 04/19/22 1336  Pulse 60 04/19/22 1336  Resp 15 04/19/22 1336  SpO2 100 % 04/19/22 1336    Last Pain:  Vitals:   04/19/22 1336  TempSrc: Temporal  PainSc: Asleep         Complications: No notable events documented.

## 2022-04-19 NOTE — Anesthesia Preprocedure Evaluation (Addendum)
Anesthesia Evaluation  Patient identified by MRN, date of birth, ID band Patient awake  General Assessment Comment:A&Ox2  Reviewed: Allergy & Precautions, H&P , NPO status , Patient's Chart, lab work & pertinent test results  Airway Mallampati: II  TM Distance: >3 FB Neck ROM: full    Dental  (+) Missing   Pulmonary Current SmokerPatient did not abstain from smoking.   Pulmonary exam normal        Cardiovascular Exercise Tolerance: Poor hypertension, Pt. on home beta blockers and Pt. 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  History of HIV dementia.   Hep C - s/p Harvoni for 12 weeks treatment  Patient reports balancing problem.  He uses wheelchair.  He lives in a group home  02/21/2022 MRI abdomen with and without contrast showed 1. Large, multiseptated cystic lesion occupying the majority of the pancreatic head and uncinate, measuring 7.6 x 5.7 x 4.6 cm. This may reflect a large, complex pseudocyst or cystic pancreatic neoplasm. No obvious significant solid component or contrast enhancement. Given large size recommend EUS/FNA for definitive diagnosis. 2. Severely atrophic pancreatic parenchyma with diffuse dilatation along the length of the pancreatic duct measuring up to 0.9 cm in   Past Medical History: No date: Acute renal failure ([place]) 01/20/2012: Altered mental status No date: Diabetes mellitus without complication (HCC) No date:  HIV (human immunodeficiency virus infection) ([place]) No date: Hypertension  Past Surgical History: No date: NO PAST SURGERIES     Reproductive/Obstetrics negative OB ROS                             Anesthesia Physical Anesthesia Plan  ASA: 3  Anesthesia Plan: General   Post-op Pain Management:    Induction:   PONV Risk Score and Plan: Propofol infusion and TIVA  Airway Management Planned: Natural Airway  Additional Equipment:   Intra-op Plan:   Post-operative Plan:   Informed Consent: I have reviewed the patients History and Physical, chart, labs and discussed the procedure including the risks, benefits and alternatives for the proposed anesthesia with the patient or authorized representative who has indicated his/her understanding and acceptance.     Dental Advisory Given  Plan Discussed with: CRNA and Surgeon  Anesthesia Plan Comments:         Anesthesia Quick Evaluation

## 2022-04-19 NOTE — Progress Notes (Signed)
Entered in error

## 2022-04-19 NOTE — Progress Notes (Signed)
Caregiver/Driver Mark present. Went over discharge instructions with Jay Flores and he is aware to pick up the antibiotics from the pharmacy and give as directed. Went over the directions for the antibiotic.

## 2022-04-19 NOTE — Op Note (Signed)
[facility] Gastroenterology Patient Name: Jay Flores Procedure Date: 04/19/2022 12:11 PM MRN: [id number] Account #: [id number] Date of Birth: [birth year] Admit Type: Outpatient Age: 62 Room: [facility] ENDO ROOM 3 Gender: Male Note Status: Finalized Instrument Name: Linear EUS Scope [id number] Procedure:             Upper EUS Indications:           Pancreatic cyst on MRCP Patient Profile:       Refer to note in patient chart for documentation of                         history and physical. Providers:             Lenetta Quaker. Cephas Darby, MD Referring MD:          Earlie Server, MD (Referring MD), Bennett Scrape. Matrone                         (Referring MD) Medicines:             Monitored Anesthesia Care, Cipro [id number] mg IV Complications:         No immediate complications. Procedure:             Pre-Anesthesia Assessment:                        - Monitored anesthesia care under the supervision of                         an anesthesiologist was determined to be medically                         necessary for this procedure based on complex                         procedure (ERCP, EUS).                        After obtaining informed consent, the endoscope was                         passed under direct vision. Throughout the procedure,                         the patient's blood pressure, pulse, and oxygen                         saturations were monitored continuously. The Endoscope                         was introduced through the mouth, and advanced to the                         duodenum for ultrasound examination from the                         esophagus, stomach and duodenum. Findings:      ENDOSCOPIC FINDING: :      The examined esophagus was endoscopically normal.      The entire examined stomach was endoscopically normal.      The examined duodenum  was endoscopically normal.      ENDOSONOGRAPHIC FINDING: :      An anechoic lesion suggestive of a cyst was identified in the  pancreatic       head. The lesion measured 49 mm by 38 mm in maximal cross-sectional       diameter. There were 2 compartments thickly septated. The outer wall of       the lesion was thick. There was no associated mass. There was internal       debris within the fluid-filled cavity. Diagnostic and therapeutic needle       aspiration for fluid was performed. Color Doppler imaging was utilized       prior to needle puncture to confirm a lack of significant vascular       structures within the needle path. Two passes were made with the 22       gauge needle using a transduodenal approach. A stylet was used. The       amount of fluid collected was 30 mL. The fluid was clear and watery.       Sample(s) were sent for amylase concentration, cytology and CEA.      The pancreatic duct had a beaded endosonographic appearance and had a       dilated endosonographic appearance in the main pancreatic duct. The       pancreatic duct measured up to 9.6 mm in the head, 6.7 mm in the neck,       4.1 mm in the body and 3.7 mm in the tail.      Pancreatic parenchymal abnormalities were noted in the entire pancreas.       These consisted of atrophy and hyperechoic foci with shadowing.      There was no sign of significant endosonographic abnormality in the       common bile duct and in the common hepatic duct. The maximum diameter of       the CHD was 3.1 mm and CBD was 2.7 mm.      There was no sign of significant endosonographic abnormality in the left       lobe of the liver. No masses were identified.      No lymph nodes were visualized in the celiac region (level 20),       peripancreatic region and porta hepatis region. Impression:            EGD Impression:                        - Normal esophagus.                        - Normal stomach.                        - Normal examined duodenum.                        EUS impression:                        - A cystic lesion was seen in the pancreatic  head.                         Tissue was obtained from this exam, and results are  pending. However, the endosonographic appearance is                         suspicious for a pancreatic pseudocyst. Fine needle                         aspiration for fluid performed.                        - The pancreatic duct had a beaded endosonographic                         appearance and had a dilated endosonographic                         appearance in the main pancreatic duct. The pancreatic                         duct measured up to 9.6 mm in diameter.                        - Pancreatic parenchymal abnormalities consisting of                         atrophy and hyperechoic foci were noted in the entire                         pancreas. Findings are consistent with chronic                         pancreatitis per [place] criteria.                        - There was no sign of significant pathology in the                         common bile duct and in the common hepatic duct.                        - There was no evidence of significant pathology in                         the left lobe of the liver.                        - No lymph nodes were visualized in the celiac region                         (level 20), peripancreatic region and porta hepatis                         region. Recommendation:        - Discharge patient to home (via wheelchair).                        - Cipro (ciprofloxacin) 500 mg PO BID for 5 days.                        - Await  cytology, CEA and amylase results.                        - The findings and recommendations were discussed with                         the patient and their family.                        - Return to referring physician as previously                         scheduled. Attending Participation:      I personally performed the entire procedure. Dr. Lenetta Quaker. Cephas Darby, MD Lenetta Quaker. Jaquarious Grey, MD 04/19/2022 1:58:02 PM This  report has been signed electronically. Number of Addenda: 0 Note Initiated On: 04/19/2022 12:11 PM Estimated Blood Loss:  Estimated blood loss was minimal.      [facility]

## 2022-04-20 ENCOUNTER — Encounter: Payer: Self-pay | Admitting: Gastroenterology

## 2022-04-20 LAB — CYTOLOGY - NON PAP

## 2022-04-20 NOTE — Anesthesia Postprocedure Evaluation (Signed)
Anesthesia Post Note  Patient: Jay Flores  Procedure(s) Performed: UPPER ENDOSCOPIC ULTRASOUND (EUS) LINEAR  Patient location during evaluation: PACU Anesthesia Type: General Level of consciousness: awake and alert Pain management: pain level controlled Vital Signs Assessment: post-procedure vital signs reviewed and stable Respiratory status: spontaneous breathing, nonlabored ventilation and respiratory function stable Cardiovascular status: blood pressure returned to baseline and stable Postop Assessment: no apparent nausea or vomiting Anesthetic complications: no   No notable events documented.   Last Vitals:  Vitals:   04/19/22 1346 04/19/22 1400  BP: 138/83 (!) 141/87  Pulse: (!) 56 (!) 57  Resp: 17 18  Temp:    SpO2: 100% 100%    Last Pain:  Vitals:   04/19/22 1336  TempSrc: Temporal  PainSc: Asleep                 [place] Ouch

## 2022-04-24 LAB — MISC LABCORP TEST (SEND OUT): Labcorp test code: 88062

## 2022-05-01 ENCOUNTER — Telehealth: Payer: Self-pay

## 2022-05-01 ENCOUNTER — Ambulatory Visit
Admission: RE | Admit: 2022-05-01 | Discharge: 2022-05-01 | Disposition: A | Discharge status: Home / Self Care | Payer: Medicaid Other | Source: Ambulatory Visit | Attending: Family Medicine | Admitting: Family Medicine

## 2022-05-01 DIAGNOSIS — Z8619 Personal history of other infectious and parasitic diseases: Secondary | ICD-10-CM | POA: Diagnosis present

## 2022-05-01 LAB — MISC LABCORP TEST (SEND OUT): Labcorp test code: 9985

## 2022-05-01 NOTE — Telephone Encounter (Signed)
Dr. Tasia Catchings has been notified that all results are back from EUS. She would like to see him back in the next few weeks to discuss results and surveillance. Message sent to scheduling.

## 2022-05-11 ENCOUNTER — Ambulatory Visit (INDEPENDENT_AMBULATORY_CARE_PROVIDER_SITE_OTHER): Payer: Medicaid Other | Admitting: Physician Assistant

## 2022-05-11 VITALS — BP 132/66 | HR 61 | Ht 68.0 in | Wt 135.0 lb

## 2022-05-11 DIAGNOSIS — R339 Retention of urine, unspecified: Secondary | ICD-10-CM | POA: Diagnosis not present

## 2022-05-11 DIAGNOSIS — Z466 Encounter for fitting and adjustment of urinary device: Secondary | ICD-10-CM

## 2022-05-11 NOTE — Progress Notes (Signed)
Cath Change/ Replacement  Patient is present today for a catheter change due to urinary retention.  31ml of water was removed from the balloon, a 18FR coude foley cath was removed without difficulty.  Patient was cleaned and prepped in a sterile fashion with betadine and 2% lidocaine jelly was instilled into the urethra. A 18 FR coude foley cath was replaced into the bladder, no complications were noted. Urine return was noted 73ml and urine was yellow in color. The balloon was filled with 82ml of sterile water. A leg bag was attached for drainage.  Patient tolerated well.    Performed by: Carman Ching, PA-C   Follow up: Return in about 4 weeks (around 06/08/2022) for Catheter exchange.

## 2022-05-25 ENCOUNTER — Encounter: Payer: Self-pay | Admitting: Oncology

## 2022-05-25 ENCOUNTER — Ambulatory Visit: Payer: Medicaid Other | Admitting: Oncology

## 2022-05-25 ENCOUNTER — Inpatient Hospital Stay: Payer: Medicaid Other | Attending: Oncology | Admitting: Oncology

## 2022-05-25 VITALS — HR 55 | Temp 97.0°F | Resp 18 | Wt 135.0 lb

## 2022-05-25 DIAGNOSIS — K862 Cyst of pancreas: Secondary | ICD-10-CM

## 2022-05-25 DIAGNOSIS — E1122 Type 2 diabetes mellitus with diabetic chronic kidney disease: Secondary | ICD-10-CM | POA: Diagnosis not present

## 2022-05-25 DIAGNOSIS — I129 Hypertensive chronic kidney disease with stage 1 through stage 4 chronic kidney disease, or unspecified chronic kidney disease: Secondary | ICD-10-CM | POA: Insufficient documentation

## 2022-05-25 DIAGNOSIS — N1832 Chronic kidney disease, stage 3b: Secondary | ICD-10-CM | POA: Diagnosis not present

## 2022-05-25 DIAGNOSIS — D696 Thrombocytopenia, unspecified: Secondary | ICD-10-CM | POA: Diagnosis not present

## 2022-05-25 DIAGNOSIS — N401 Enlarged prostate with lower urinary tract symptoms: Secondary | ICD-10-CM | POA: Insufficient documentation

## 2022-05-25 NOTE — Assessment & Plan Note (Addendum)
Imaging findings were reviewed and discussed with patient.-Large, multiseptated cystic pancreatic lesion normal CA 19-9.  PET scan was not approved Pancreatic cyst fluid CEA 159 S/p EUS biopsy non diagnostic I recommend patient establish care visit to pancreatic surgeon for further evaluation.  Patient agrees with the plan.

## 2022-05-25 NOTE — Progress Notes (Signed)
Hematology/Oncology Consult Note [phone] [phone]     REFERRING PROVIDER: Benedetto Goad, MD    CHIEF COMPLAINTS/PURPOSE OF CONSULTATION:  Pancreatic cyst  ASSESSMENT & PLAN:   Pancreatic cyst Imaging findings were reviewed and discussed with patient.-Large, multiseptated cystic pancreatic lesion normal CA 19-9.  PET scan was not approved Pancreatic cyst fluid CEA 159 S/p EUS biopsy non diagnostic I recommend patient establish care visit to pancreatic surgeon for further evaluation.  Patient agrees with the plan.  CKD (chronic kidney disease) stage 3, GFR 30-59 ml/min (HCC) Encourage oral hydration and avoid nephrotoxins.    Thrombocytopenia (HCC) Chronic.  Close to baseline.  Continue monitor.   Orders Placed This Encounter  Procedures   Ambulatory referral to Surgical Oncology    Referral Priority:   Routine    Referral Type:   Surgical    Referral Reason:   Specialty Services Required    Requested Specialty:   Surgical Oncology    Number of Visits Requested:   1   Follow-up TBD All questions were answered. The patient knows to call the clinic with any problems, questions or concerns.  Rickard Patience, MD, PhD [facility] Hematology Oncology 05/25/2022    HISTORY OF PRESENTING ILLNESS:  Jay Flores 62 y.o. male presents to establish care for pancreatic cyst  Patient has a history of pancreatic cyst. 02/21/2022 MRI abdomen with and without contrast showed 1. Large, multiseptated cystic lesion occupying the majority of the pancreatic head and uncinate, measuring 7.6 x 5.7 x 4.6 cm. This may reflect a large, complex pseudocyst or cystic pancreatic neoplasm. No obvious significant solid component or contrast enhancement. Given large size recommend EUS/FNA for definitive diagnosis. 2. Severely atrophic pancreatic parenchyma with diffuse dilatation along the length of the pancreatic duct measuring up to 0.9 cm in caliber. No acute  inflammatory findings. 3. Cholelithiasis. 4. Large burden of stool in the included colon.   Patient has a history of HIV, CKD stage IIIb, hypertension, diabetes.  BPH with lower urinary tract symptoms. 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  History of HIV dementia.   Hep C - s/p Harvoni for 12 weeks treatment  Patient reports balancing problem.  He uses wheelchair.  He lives in a group home. Patient denies any current alcohol use.  He is a daily smoker.   INTERVAL HISTORY Jay Flores is a 62 y.o. male who has above history reviewed by me today presents for follow up visit for anxiety cyst.  Status post EUS biopsy, pathology is nondiagnostic.  Pancreatic cyst fluid 159. Patient was accompanied by facility staff.  He has no new complaints. MEDICAL HISTORY:  Past Medical History:  Diagnosis Date   Acute renal failure    Altered mental status 01/20/2012   Diabetes mellitus without complication    HIV (human immunodeficiency virus infection)    Hypertension     SURGICAL HISTORY: Past Surgical History:  Procedure Laterality Date   EUS N/A 04/19/2022   Procedure: UPPER ENDOSCOPIC ULTRASOUND (EUS) LINEAR;  Surgeon: Doren Custard, MD;  Location: [facility] ENDOSCOPY;  Service: Gastroenterology;  Laterality: N/A;  Requesting 1p slot   NO PAST SURGERIES      SOCIAL HISTORY: Social History   Socioeconomic History   Marital status: Single    Spouse name: Not on file   Number of children: Not on file   Years of education: Not on file   Highest education level: Not on file  Occupational History  Occupation: disabled  Tobacco Use   Smoking status: Every Day    Packs/day: 1.00    Years: 34.00    Additional pack years: 0.00    Total pack years: 34.00    Types: Cigarettes   Smokeless tobacco: Never  Vaping Use   Vaping Use: Never used  Substance and Sexual Activity   Alcohol use: No   Drug use: Not Currently    Types: Cocaine    Comment:  01/22/2012 "last used cocaine ~ 2012"   Sexual activity: Never  Other Topics Concern   Not on file  Social History Narrative   Not on file   Social Determinants of Health   Financial Resource Strain: Not on file  Food Insecurity: No Food Insecurity (03/23/2022)   Hunger Vital Sign    Worried About Running Out of Food in the Last Year: Never true    Ran Out of Food in the Last Year: Never true  Transportation Needs: No Transportation Needs (03/23/2022)   PRAPARE - Administrator, Civil Service (Medical): No    Lack of Transportation (Non-Medical): No  Physical Activity: Not on file  Stress: Not on file  Social Connections: Not on file  Intimate Partner Violence: Not At Risk (03/23/2022)   Humiliation, Afraid, Rape, and Kick questionnaire    Fear of Current or Ex-Partner: No    Emotionally Abused: No    Physically Abused: No    Sexually Abused: No    FAMILY HISTORY: Family History  Problem Relation Age of Onset   Diabetes Mellitus II Mother     ALLERGIES:  has No Known Allergies.  MEDICATIONS:  Current Outpatient Medications  Medication Sig Dispense Refill   Accu-Chek Softclix Lancets lancets by Other route. Use as instructed     acetaminophen (TYLENOL 8 HOUR ARTHRITIS PAIN) 650 MG CR tablet Take 650 mg by mouth every 8 (eight) hours as needed for pain.     acetaminophen (TYLENOL) 325 MG tablet Take 650 mg by mouth every 4 (four) hours as needed.     atorvastatin (LIPITOR) 20 MG tablet Take 20 mg by mouth daily.     Cholecalciferol (VITAMIN D3) 25 MCG (1000 UT) CAPS Take 1 capsule by mouth daily.     Continuous Blood Gluc Receiver (FREESTYLE LIBRE 14 DAY READER) DEVI Apply topically.     Continuous Blood Gluc Sensor (FREESTYLE LIBRE 14 DAY SENSOR) MISC Apply topically.     Dolutegravir-Rilpivirine (JULUCA) 50-25 MG TABS Take 1 tablet by mouth daily.     glucose blood (ACCU-CHEK AVIVA PLUS) test strip 1 each by Other route in the morning, at noon, and at bedtime.  Use as instructed     insulin aspart protamine- aspart (NOVOLOG MIX 70/30) (70-30) 100 UNIT/ML injection Inject 14 Units into the skin 2 (two) times daily with a meal.     Insulin Pen Needle (ULTICARE MICRO PEN NEEDLES) 31G X 8 MM MISC by Does not apply route. As needed     loperamide (IMODIUM) 2 MG capsule Take 2 mg by mouth in the morning and at bedtime.     losartan (COZAAR) 100 MG tablet Take 100 mg by mouth daily.     metoprolol succinate (TOPROL-XL) 100 MG 24 hr tablet Take 150 mg by mouth daily.     Sodium Bicarbonate-Citric Acid 1940-1000 MG TBEF Take 1 tablet by mouth in the morning and at bedtime.     No current facility-administered medications for this visit.    Review of Systems  Constitutional:  Negative for appetite change, chills, fatigue, fever and unexpected weight change.  HENT:   Negative for hearing loss and voice change.   Eyes:  Negative for eye problems and icterus.  Respiratory:  Negative for chest tightness, cough and shortness of breath.   Cardiovascular:  Negative for chest pain and leg swelling.  Gastrointestinal:  Negative for abdominal distention, abdominal pain, nausea and vomiting.  Endocrine: Negative for hot flashes.  Genitourinary:  Positive for frequency. Negative for difficulty urinating and dysuria.   Musculoskeletal:  Positive for gait problem. Negative for arthralgias.  Skin:  Negative for itching and rash.  Neurological:  Positive for gait problem. Negative for light-headedness and numbness.  Hematological:  Negative for adenopathy. Does not bruise/bleed easily.  Psychiatric/Behavioral:  Negative for confusion.      PHYSICAL EXAMINATION: ECOG PERFORMANCE STATUS: 2 - Symptomatic, <50% confined to bed  Vitals:   05/25/22 1013  Pulse: (!) 55  Resp: 18  Temp: (!) 97 F (36.1 C)  SpO2: 100%   Filed Weights   05/25/22 1013  Weight: 135 lb (61.2 kg)    Physical Exam Constitutional:      General: He is not in acute distress.     Appearance: He is not diaphoretic.     Comments: Patient sits in the wheelchair.  HENT:     Head: Normocephalic and atraumatic.     Nose: Nose normal.     Mouth/Throat:     Pharynx: No oropharyngeal exudate.  Eyes:     General: No scleral icterus.    Pupils: Pupils are equal, round, and reactive to light.  Cardiovascular:     Rate and Rhythm: Normal rate.     Heart sounds: No murmur heard. Pulmonary:     Effort: Pulmonary effort is normal. No respiratory distress.  Abdominal:     General: There is no distension.     Palpations: Abdomen is soft.     Tenderness: There is no abdominal tenderness.  Musculoskeletal:        General: Normal range of motion.     Cervical back: Normal range of motion and neck supple.  Skin:    General: Skin is warm and dry.     Findings: No erythema.  Neurological:     Mental Status: He is alert. Mental status is at baseline.     Cranial Nerves: No cranial nerve deficit.     Motor: No abnormal muscle tone.     Comments: Patient answers questions appropriately  Psychiatric:        Mood and Affect: Mood and affect normal.      LABORATORY DATA:  I have reviewed the data as listed    Latest Ref Rng & Units 03/23/2022   11:32 AM 03/10/2022    3:11 PM 05/25/2017    4:18 PM  CBC  WBC 4.0 - 10.5 K/uL 5.3  6.1  6.1   Hemoglobin 13.0 - 17.0 g/dL 16.1  09.6  04.5   Hematocrit 39.0 - 52.0 % 42.1  41.0  33.5   Platelets 150 - 400 K/uL 125  115  167       Latest Ref Rng & Units 03/23/2022   11:32 AM 03/10/2022    3:11 PM 05/25/2017    4:18 PM  CMP  Glucose 70 - 99 mg/dL 75  409  811   BUN 6 - 20 mg/dL 21  33  23   Creatinine 0.61 - 1.24 mg/dL 9.14  7.82  9.56  Sodium 135 - 145 mmol/L 136  137  140   Potassium 3.5 - 5.1 mmol/L 4.8  4.3  4.3   Chloride 98 - 111 mmol/L 108  109  114   CO2 22 - 32 mmol/L 22  22  18    Calcium 8.9 - 10.3 mg/dL 8.8  8.8  9.0   Total Protein 6.5 - 8.1 g/dL 7.3  7.1  7.3   Total Bilirubin 0.3 - 1.2 mg/dL 0.8  0.8  0.4    Alkaline Phos 38 - 126 U/L 112  95  126   AST 15 - 41 U/L 22  17  21    ALT 0 - 44 U/L 28  17  21       RADIOGRAPHIC STUDIES: I have personally reviewed the radiological images as listed and agreed with the findings in the report. US Abdomen Limited RUQ (LIVER/GB)  Result Date: 05/01/2022 CLINICAL DATA:  HCC screening EXAM: ULTRASOUND ABDOMEN LIMITED RIGHT UPPER QUADRANT COMPARISON:  MRI abdomen 02/21/2022 FINDINGS: Gallbladder: Multiple stones in the gallbladder lumen. No gallbladder wall thickening or pericholecystic fluid. Negative sonographic Murphy's sign. Common bile duct: Diameter: 3.6 mm Liver: Mild heterogeneity of the hepatic parenchyma. No focal lesion identified. Portal vein is patent on color Doppler imaging with normal direction of blood flow towards the liver. Other: None. IMPRESSION: Cholelithiasis without secondary signs of acute cholecystitis. No focal hepatic lesion identified. Electronically Signed   By: Annia Belt M.D.   On: 05/01/2022 09:50

## 2022-05-25 NOTE — Progress Notes (Signed)
Patient here for oncology follow-up appointment, expresses no complaints or concerns at this time.    

## 2022-05-25 NOTE — Assessment & Plan Note (Signed)
Chronic.  Close to baseline.  Continue monitor. 

## 2022-05-25 NOTE — Assessment & Plan Note (Signed)
Encourage oral hydration and avoid nephrotoxins.   

## 2022-05-31 ENCOUNTER — Ambulatory Visit: Payer: Medicaid Other | Admitting: Podiatry

## 2022-05-31 ENCOUNTER — Encounter: Payer: Self-pay | Admitting: Podiatry

## 2022-05-31 DIAGNOSIS — M79675 Pain in left toe(s): Secondary | ICD-10-CM

## 2022-05-31 DIAGNOSIS — E1142 Type 2 diabetes mellitus with diabetic polyneuropathy: Secondary | ICD-10-CM

## 2022-05-31 DIAGNOSIS — L84 Corns and callosities: Secondary | ICD-10-CM | POA: Diagnosis not present

## 2022-05-31 DIAGNOSIS — B351 Tinea unguium: Secondary | ICD-10-CM

## 2022-05-31 DIAGNOSIS — M79674 Pain in right toe(s): Secondary | ICD-10-CM | POA: Diagnosis not present

## 2022-05-31 NOTE — Progress Notes (Signed)
This patient returns to my office for at risk foot care.  This patient requires this care by a professional since this patient will be at risk due to having diabetes and kidney disease.  Pt has painful callus left foot when walking.This patient is unable to cut nails himself since the patient cannot reach his nails.These nails are painful walking and wearing shoes.  This patient presents for at risk foot care today.  General Appearance  Alert, conversant and in no acute stress.  Vascular  Dorsalis pedis and posterior tibial  pulses are palpable  bilaterally.  Capillary return is within normal limits  bilaterally. Temperature is within normal limits  bilaterally.  Neurologic  Senn-Weinstein monofilament wire test diminished  bilaterally. Muscle power within normal limits bilaterally.  Nails Thick disfigured discolored nails with subungual debris  from hallux to fifth toes bilaterally. No evidence of bacterial infection or drainage bilaterally.  Orthopedic  No limitations of motion  feet .  No crepitus or effusions noted.  No bony pathology or digital deformities noted.  Skin  normotropic skin noted bilaterally.  No signs of infections or ulcers noted.   Porokeratosis sub 5 B/L.  Onychomycosis  Pain in right toes  Pain in left toes  Porokeratosis left foot.  Consent was obtained for treatment procedures.   Mechanical debridement of nails 1-5  bilaterally performed with a nail nipper.  Filed with dremel without incident. Debridement of callus with # 15 blade. Callus were  done as a couresy.   Return office visit  3   months                    Told patient to return for periodic foot care and evaluation due to potential at risk complications.   Ballard Budney DPM   

## 2022-06-08 ENCOUNTER — Ambulatory Visit: Payer: Medicaid Other | Admitting: Physician Assistant

## 2022-06-12 ENCOUNTER — Ambulatory Visit (INDEPENDENT_AMBULATORY_CARE_PROVIDER_SITE_OTHER): Payer: Medicaid Other | Admitting: Physician Assistant

## 2022-06-12 ENCOUNTER — Encounter: Payer: Self-pay | Admitting: Physician Assistant

## 2022-06-12 DIAGNOSIS — R339 Retention of urine, unspecified: Secondary | ICD-10-CM | POA: Diagnosis not present

## 2022-06-12 DIAGNOSIS — Z466 Encounter for fitting and adjustment of urinary device: Secondary | ICD-10-CM | POA: Diagnosis not present

## 2022-06-12 NOTE — Progress Notes (Signed)
Cath Change/ Replacement  Patient is present today for a catheter change due to urinary retention.  8ml of water was removed from the balloon, a 18 FR coude foley cath was removed without difficulty.  Patient was cleaned and prepped in a sterile fashion with betadine and 2% lidocaine jelly was instilled into the urethra. A 18 FR coude foley cath was replaced into the bladder, no complications were noted. Urine return was noted 50 ml and urine was pale yellow in color. The balloon was filled with 10ml of sterile water. A night bag was attached. Patient was given proper instruction on catheter care.    Performed by: August Luz   Follow up: 1 month catheter change

## 2022-07-13 ENCOUNTER — Ambulatory Visit (INDEPENDENT_AMBULATORY_CARE_PROVIDER_SITE_OTHER): Payer: Medicaid Other | Admitting: Physician Assistant

## 2022-07-13 ENCOUNTER — Encounter: Payer: Self-pay | Admitting: Physician Assistant

## 2022-07-13 DIAGNOSIS — R339 Retention of urine, unspecified: Secondary | ICD-10-CM

## 2022-07-13 DIAGNOSIS — Z466 Encounter for fitting and adjustment of urinary device: Secondary | ICD-10-CM

## 2022-07-13 NOTE — Progress Notes (Signed)
Cath Change/ Replacement  Patient is present today for a catheter change due to urinary retention.  8ml of water was removed from the balloon, a 18FR coude foley cath was removed without difficulty.  Patient was cleaned and prepped in a sterile fashion with betadine and 2% lidocaine jelly was instilled into the urethra. A 18 FR coude foley cath was replaced into the bladder, no complications were noted. Urine return was noted 5ml and urine was yellow in color. The balloon was filled with 10ml of sterile water. A leg bag was attached for drainage.  Patient tolerated well.    Performed by: Carman Ching, PA-C   Follow up: Return in 4 weeks (on 08/10/2022) for follow-up cath change, Catheter exchange.

## 2022-08-17 ENCOUNTER — Encounter: Payer: Self-pay | Admitting: Physician Assistant

## 2022-08-17 ENCOUNTER — Ambulatory Visit (INDEPENDENT_AMBULATORY_CARE_PROVIDER_SITE_OTHER): Payer: Medicaid Other | Admitting: Physician Assistant

## 2022-08-17 VITALS — BP 136/79 | HR 54

## 2022-08-17 DIAGNOSIS — R339 Retention of urine, unspecified: Secondary | ICD-10-CM | POA: Diagnosis not present

## 2022-08-17 DIAGNOSIS — Z466 Encounter for fitting and adjustment of urinary device: Secondary | ICD-10-CM

## 2022-08-17 NOTE — Progress Notes (Signed)
Cath Change/ Replacement  Patient is present today for a catheter change due to urinary retention.  9 ml of water was removed from the balloon, a 18 FR foley cath was removed without difficulty.  Patient was cleaned and prepped in a sterile fashion with betadine and 2% lidocaine jelly was instilled into the urethra. A 18 FR foley cath was replaced into the bladder, no complications were noted. Urine return was noted 1 ml and urine was yellow in color. The balloon was filled with 10ml of sterile water. A leg bag was attached for drainage. Patient was given proper instruction on catheter care.    Performed by: [place] Pink RMA  Follow up: F/UP as scheduled next mth for cath change

## 2022-08-29 ENCOUNTER — Encounter: Payer: Self-pay | Admitting: Internal Medicine

## 2022-08-30 ENCOUNTER — Ambulatory Visit: Payer: Medicaid Other | Admitting: Podiatry

## 2022-09-05 ENCOUNTER — Ambulatory Visit
Admission: RE | Admit: 2022-09-05 | Discharge: 2022-09-05 | Disposition: A | Discharge status: Home / Self Care | Payer: Medicaid Other | Attending: Internal Medicine | Admitting: Internal Medicine

## 2022-09-05 ENCOUNTER — Ambulatory Visit: Payer: Medicaid Other | Admitting: Anesthesiology

## 2022-09-05 ENCOUNTER — Encounter
Admission: RE | Disposition: A | Discharge status: Home / Self Care | Payer: Self-pay | Source: Home / Self Care | Attending: Internal Medicine

## 2022-09-05 ENCOUNTER — Encounter: Payer: Self-pay | Admitting: Internal Medicine

## 2022-09-05 DIAGNOSIS — E1122 Type 2 diabetes mellitus with diabetic chronic kidney disease: Secondary | ICD-10-CM | POA: Insufficient documentation

## 2022-09-05 DIAGNOSIS — Z794 Long term (current) use of insulin: Secondary | ICD-10-CM | POA: Diagnosis not present

## 2022-09-05 DIAGNOSIS — I714 Abdominal aortic aneurysm, without rupture, unspecified: Secondary | ICD-10-CM | POA: Diagnosis not present

## 2022-09-05 DIAGNOSIS — F0390 Unspecified dementia without behavioral disturbance: Secondary | ICD-10-CM | POA: Insufficient documentation

## 2022-09-05 DIAGNOSIS — Z1211 Encounter for screening for malignant neoplasm of colon: Secondary | ICD-10-CM | POA: Diagnosis present

## 2022-09-05 DIAGNOSIS — D120 Benign neoplasm of cecum: Secondary | ICD-10-CM | POA: Diagnosis not present

## 2022-09-05 DIAGNOSIS — D696 Thrombocytopenia, unspecified: Secondary | ICD-10-CM | POA: Insufficient documentation

## 2022-09-05 DIAGNOSIS — B1920 Unspecified viral hepatitis C without hepatic coma: Secondary | ICD-10-CM | POA: Insufficient documentation

## 2022-09-05 DIAGNOSIS — I129 Hypertensive chronic kidney disease with stage 1 through stage 4 chronic kidney disease, or unspecified chronic kidney disease: Secondary | ICD-10-CM | POA: Insufficient documentation

## 2022-09-05 DIAGNOSIS — D125 Benign neoplasm of sigmoid colon: Secondary | ICD-10-CM | POA: Diagnosis not present

## 2022-09-05 DIAGNOSIS — N1831 Chronic kidney disease, stage 3a: Secondary | ICD-10-CM | POA: Diagnosis not present

## 2022-09-05 DIAGNOSIS — K5730 Diverticulosis of large intestine without perforation or abscess without bleeding: Secondary | ICD-10-CM | POA: Diagnosis not present

## 2022-09-05 DIAGNOSIS — K640 First degree hemorrhoids: Secondary | ICD-10-CM | POA: Insufficient documentation

## 2022-09-05 DIAGNOSIS — Z79899 Other long term (current) drug therapy: Secondary | ICD-10-CM | POA: Insufficient documentation

## 2022-09-05 DIAGNOSIS — K862 Cyst of pancreas: Secondary | ICD-10-CM | POA: Diagnosis not present

## 2022-09-05 DIAGNOSIS — Z21 Asymptomatic human immunodeficiency virus [HIV] infection status: Secondary | ICD-10-CM | POA: Insufficient documentation

## 2022-09-05 HISTORY — PX: COLONOSCOPY WITH PROPOFOL: SHX5780

## 2022-09-05 HISTORY — PX: HEMOSTASIS CLIP PLACEMENT: SHX6857

## 2022-09-05 HISTORY — PX: POLYPECTOMY: SHX5525

## 2022-09-05 SURGERY — COLONOSCOPY WITH PROPOFOL
Anesthesia: General

## 2022-09-05 MED ORDER — LIDOCAINE HCL (CARDIAC) PF 100 MG/5ML IV SOSY
PREFILLED_SYRINGE | INTRAVENOUS | Status: DC | PRN
  Administered 2022-09-05: 60 mg via INTRAVENOUS

## 2022-09-05 MED ORDER — SODIUM CHLORIDE 0.9 % IV SOLN
INTRAVENOUS | Status: DC
  Administered 2022-09-05: 1000 mL via INTRAVENOUS

## 2022-09-05 MED ORDER — PROPOFOL 10 MG/ML IV BOLUS
INTRAVENOUS | Status: DC | PRN
  Administered 2022-09-05: 20 mg via INTRAVENOUS
  Administered 2022-09-05: 60 mg via INTRAVENOUS
  Administered 2022-09-05: 20 mg via INTRAVENOUS

## 2022-09-05 MED ORDER — PROPOFOL 500 MG/50ML IV EMUL
INTRAVENOUS | Status: DC | PRN
  Administered 2022-09-05: 165 ug/kg/min via INTRAVENOUS

## 2022-09-05 NOTE — Interval H&P Note (Signed)
History and Physical Interval Note:  09/05/2022 11:12 AM  Jay Flores  has presented today for surgery, with the diagnosis of V76.51 (ICD-9-CM) - Z12.11 (ICD-10-CM) - Colon cancer screening.  The various methods of treatment have been discussed with the patient and family. After consideration of risks, benefits and other options for treatment, the patient has consented to  Procedure(s): COLONOSCOPY WITH PROPOFOL (N/A) as a surgical intervention.  The patient's history has been reviewed, patient examined, no change in status, stable for surgery.  I have reviewed the patient's chart and labs.  Questions were answered to the patient's satisfaction.     [place], [place]

## 2022-09-05 NOTE — Op Note (Signed)
[facility] Gastroenterology Patient Name: Jay Flores Procedure Date: 09/05/2022 11:44 AM MRN: [id number] Account #: [id number] Date of Birth: [birth year] Admit Type: Outpatient Age: 62 Room: [facility] ENDO ROOM 2 Gender: Male Note Status: Finalized Instrument Name: Nelda Marseille [id number] Procedure:             Colonoscopy Indications:           Screening for colorectal malignant neoplasm Providers:             Royce Macadamia K. Norma Fredrickson MD, MD Referring MD:          Benedetto Goad, MD Medicines:             Propofol per Anesthesia Complications:         No immediate complications. Estimated blood loss: None. Procedure:             Pre-Anesthesia Assessment:                        - The risks and benefits of the procedure and the                         sedation options and risks were discussed with the                         patient. All questions were answered and informed                         consent was obtained.                        - Patient identification and proposed procedure were                         verified prior to the procedure by the nurse. The                         procedure was verified in the procedure room.                        - ASA Grade Assessment: III - A patient with severe                         systemic disease.                        - After reviewing the risks and benefits, the patient                         was deemed in satisfactory condition to undergo the                         procedure.                        After obtaining informed consent, the colonoscope was                         passed under direct vision. Throughout the procedure,  the patient's blood pressure, pulse, and oxygen                         saturations were monitored continuously. The                         Colonoscope was introduced through the anus and                         advanced to the the cecum, identified by appendiceal                          orifice and ileocecal valve. The colonoscopy was                         performed without difficulty. The patient tolerated                         the procedure well. The quality of the bowel                         preparation was adequate. The ileocecal valve,                         appendiceal orifice, and rectum were photographed. Findings:      The perianal and digital rectal examinations were normal. Pertinent       negatives include normal sphincter tone and no palpable rectal lesions.      Non-bleeding internal hemorrhoids were found during retroflexion. The       hemorrhoids were Grade I (internal hemorrhoids that do not prolapse).      A 12 mm polyp was found in the cecum. The polyp was semi-pedunculated.       The polyp was removed with a hot snare. Resection and retrieval were       complete. To prevent bleeding after the polypectomy, one hemostatic clip       was successfully placed (MR conditional). Clip manufacturer: [employer]. There was no bleeding during, or at the end, of the       procedure.      A 14 mm polyp was found in the sigmoid colon. The polyp was       pedunculated. The polyp was removed with a hot snare. Resection and       retrieval were complete.      Many medium-mouthed and small-mouthed diverticula were found in the       entire colon.      The exam was otherwise without abnormality. Impression:            - Non-bleeding internal hemorrhoids.                        - One 12 mm polyp in the cecum, removed with a hot                         snare. Resected and retrieved. Clip (MR conditional)                         was placed. Clip manufacturer: [employer].                        -  One 14 mm polyp in the sigmoid colon, removed with a                         hot snare. Resected and retrieved.                        - Diverticulosis in the entire examined colon.                        - The examination was  otherwise normal. Recommendation:        - Patient has a contact number available for                         emergencies. The signs and symptoms of potential                         delayed complications were discussed with the patient.                         Return to normal activities tomorrow. Written                         discharge instructions were provided to the patient.                        - Resume previous diet.                        - Continue present medications.                        - Repeat colonoscopy is recommended for surveillance.                         The colonoscopy date will be determined after                         pathology results from today's exam become available                         for review.                        - Return to GI office PRN.                        - The findings and recommendations were discussed with                         the patient. Procedure Code(s):     --- Professional ---                        [phone], Colonoscopy, flexible; with removal of                         tumor(s), polyp(s), or other lesion(s) by snare                         technique Diagnosis Code(s):     --- Professional ---  K57.30, Diverticulosis of large intestine without                         perforation or abscess without bleeding                        K64.0, First degree hemorrhoids                        D12.5, Benign neoplasm of sigmoid colon                        D12.0, Benign neoplasm of cecum                        Z12.11, Encounter for screening for malignant neoplasm                         of colon CPT copyright 2022 American Medical Association. All rights reserved. The codes documented in this report are preliminary and upon coder review may  be revised to meet current compliance requirements. Stanton Kidney MD, MD 09/05/2022 12:14:24 PM This report has been signed electronically. Number of Addenda: 0 Note  Initiated On: 09/05/2022 11:44 AM Scope Withdrawal Time: 0 hours 7 minutes 5 seconds  Total Procedure Duration: 0 hours 11 minutes 34 seconds  Estimated Blood Loss:  Estimated blood loss: none. Estimated blood loss was                         minimal. Estimated blood loss: none.      [facility]

## 2022-09-05 NOTE — Anesthesia Procedure Notes (Signed)
Procedure Name: General with mask airway Date/Time: 09/05/2022 11:55 AM  Performed by: Mohammed Kindle, CRNAPre-anesthesia Checklist: Patient identified, Emergency Drugs available and Suction available Patient Re-evaluated:Patient Re-evaluated prior to induction Oxygen Delivery Method: Simple face mask Induction Type: IV induction Placement Confirmation: positive ETCO2, CO2 detector and breath sounds checked- equal and bilateral Dental Injury: Teeth and Oropharynx as per pre-operative assessment

## 2022-09-05 NOTE — H&P (Signed)
Outpatient short stay form Pre-procedure 09/05/2022 11:11 AM Jay Flores K. Norma Fredrickson, M.D.  Primary Physician: Vertell Novak, M.D.  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 Use as instructed   09/04/2022   atorvastatin (LIPITOR) 20 MG tablet Take 20 mg by mouth daily.   09/04/2022   Cholecalciferol (VITAMIN D3) 25 MCG (1000 UT) CAPS Take 1 capsule by mouth daily.   09/04/2022   Continuous Blood Gluc Receiver (FREESTYLE LIBRE 14 DAY READER) DEVI Apply topically.   09/04/2022   Dolutegravir-Rilpivirine (JULUCA) 50-25 MG TABS Take 1 tablet by mouth daily.   09/04/2022   finasteride (PROSCAR) 5 MG tablet Take 5 mg by mouth daily.   09/04/2022   glucose blood (ACCU-CHEK AVIVA PLUS) test strip 1 each by Other route in the morning, at noon, and at bedtime. Use as instructed   09/04/2022   insulin aspart protamine- aspart (NOVOLOG MIX 70/30) (70-30) 100 UNIT/ML injection Inject 14 Units into the skin 2 (two) times daily with a meal.   09/04/2022   Insulin Pen Needle (ULTICARE MICRO PEN NEEDLES) 31G X 8 MM MISC by Does not apply route. As needed   09/04/2022   loperamide (IMODIUM) 2 MG capsule Take 2 mg by mouth in the morning and at bedtime.   Past Month   losartan  (COZAAR) 100 MG tablet Take 100 mg by mouth daily.   09/04/2022   metoprolol succinate (TOPROL-XL) 100 MG 24 hr tablet Take 150 mg by mouth daily.   09/04/2022   Sodium Bicarbonate-Citric Acid 1940-1000 MG TBEF Take 1 tablet by mouth in the morning and at bedtime.   09/04/2022   acetaminophen (TYLENOL 8 HOUR ARTHRITIS PAIN) 650 MG CR tablet Take 650 mg by mouth every 8 (eight) hours as needed for pain.    at prn   acetaminophen (TYLENOL) 325 MG tablet Take 650 mg by mouth every 4 (four) hours as needed.    at prn   Continuous Blood Gluc Sensor (FREESTYLE LIBRE 14 DAY SENSOR) MISC Apply topically.        No Known Allergies   Past Medical History:  Diagnosis Date   Acute renal failure (HCC)    Altered mental status 01/20/2012   Diabetes mellitus without complication (HCC)    HIV (human immunodeficiency virus infection) (HCC)    Hypertension     Review of systems:  Otherwise negative.    Physical Exam  Gen: Alert, oriented. Appears stated age.  HEENT: [place]/AT. PERRLA. Lungs: CTA, no wheezes. CV: RR nl S1, S2. Abd: soft, benign, no masses. BS+ Ext: No edema. Pulses 2+    Planned procedures: Proceed with colonoscopy. The patient understands the nature of the planned procedure, indications, risks, alternatives and potential complications including but not limited to bleeding, infection, perforation, damage to  internal organs and possible oversedation/side effects from anesthesia. The patient agrees and gives consent to proceed.  Please refer to procedure notes for findings, recommendations and patient disposition/instructions.     Lilli Dewald K. Norma Fredrickson, M.D. Gastroenterology 09/05/2022  11:11 AM

## 2022-09-05 NOTE — Anesthesia Preprocedure Evaluation (Signed)
Anesthesia Evaluation  Patient identified by MRN, date of birth, ID band Patient awake  General Assessment Comment:A&Ox2  Reviewed: Allergy & Precautions, H&P , NPO status , Patient's Chart, lab work & pertinent test results  Airway Mallampati: II  TM Distance: >3 FB Neck ROM: full    Dental  (+) Missing, Dental Advidsory Given, Poor Dentition   Pulmonary neg shortness of breath, neg sleep apnea, neg recent URI, Current SmokerPatient did not abstain from smoking.   Pulmonary exam normal        Cardiovascular Exercise Tolerance: Poor hypertension, Pt. on home beta blockers and Pt. on medications (-) angina (-) Past MI and (-) Cardiac Stents Normal cardiovascular exam     Neuro/Psych  PSYCHIATRIC DISORDERS     Dementia negative neurological ROS     GI/Hepatic Neg liver ROS,,,pancreas cyst   Endo/Other  diabetes, Type 2    Renal/GU Renal InsufficiencyRenal disease  negative genitourinary   Musculoskeletal   Abdominal Normal abdominal exam  (+)   Peds  Hematology  (+) Blood dyscrasia HIV (human immunodeficiency virus infection) Thrombocytopenia   Anesthesia Other Findings He has a history of brain lesion secondary to opportunistic Infections.  History of HIV dementia.   Hep C - s/p Harvoni for 12 weeks treatment  Patient reports balancing problem.  He uses wheelchair.  He lives in a group home  02/21/2022 MRI abdomen with and without contrast showed 1. Large, multiseptated cystic lesion occupying the majority of the pancreatic head and uncinate, measuring 7.6 x 5.7 x 4.6 cm. This may reflect a large, complex pseudocyst or cystic pancreatic neoplasm. No obvious significant solid component or contrast enhancement. Given large size recommend EUS/FNA for definitive diagnosis. 2. Severely atrophic pancreatic parenchyma with diffuse dilatation along the length of the pancreatic duct measuring up to 0.9 cm in   Past Medical  History: No date: Acute renal failure (HCC) 01/20/2012: Altered mental status No date: Diabetes mellitus without complication (HCC) No date: HIV (human immunodeficiency virus infection) (HCC) No date: Hypertension  Past Surgical History: No date: NO PAST SURGERIES     Reproductive/Obstetrics negative OB ROS                             Anesthesia Physical Anesthesia Plan  ASA: 3  Anesthesia Plan: General   Post-op Pain Management:    Induction: Intravenous  PONV Risk Score and Plan: Propofol infusion and TIVA  Airway Management Planned: Natural Airway and Nasal Cannula  Additional Equipment:   Intra-op Plan:   Post-operative Plan:   Informed Consent: I have reviewed the patients History and Physical, chart, labs and discussed the procedure including the risks, benefits and alternatives for the proposed anesthesia with the patient or authorized representative who has indicated his/her understanding and acceptance.     Dental Advisory Given  Plan Discussed with: CRNA and Surgeon  Anesthesia Plan Comments:         Anesthesia Quick Evaluation

## 2022-09-05 NOTE — Transfer of Care (Addendum)
Immediate Anesthesia Transfer of Care Note  Patient: Jay Flores  Procedure(s) Performed: COLONOSCOPY WITH PROPOFOL POLYPECTOMY  Patient Location: Endoscopy Unit  Anesthesia Type:General  Level of Consciousness: drowsy and patient cooperative  Airway & Oxygen Therapy: Patient Spontanous Breathing and Patient connected to face mask oxygen  Post-op Assessment: Report given to RN and Post -op Vital signs reviewed and stable  Post vital signs: Reviewed and stable  Last Vitals:  Vitals Value Taken Time  BP    Temp    Pulse    Resp    SpO2      Last Pain:  Vitals:   09/05/22 1100  TempSrc: Temporal  PainSc: 0-No pain         Complications: No notable events documented.

## 2022-09-06 ENCOUNTER — Encounter: Payer: Self-pay | Admitting: Internal Medicine

## 2022-09-06 ENCOUNTER — Ambulatory Visit: Payer: Medicaid Other | Admitting: Podiatry

## 2022-09-06 NOTE — Anesthesia Postprocedure Evaluation (Signed)
Anesthesia Post Note  Patient: Jay Flores  Procedure(s) Performed: COLONOSCOPY WITH PROPOFOL POLYPECTOMY HEMOSTASIS CLIP PLACEMENT  Patient location during evaluation: Endoscopy Anesthesia Type: General Level of consciousness: awake and alert Pain management: pain level controlled Vital Signs Assessment: post-procedure vital signs reviewed and stable Respiratory status: spontaneous breathing, nonlabored ventilation, respiratory function stable and patient connected to nasal cannula oxygen Cardiovascular status: blood pressure returned to baseline and stable Postop Assessment: no apparent nausea or vomiting Anesthetic complications: no   No notable events documented.   Last Vitals:  Vitals:   09/05/22 1220 09/05/22 1230  BP: (!) 142/79   Pulse: (!) 56 (!) 50  Resp: 20 18  Temp:    SpO2: 100% 100%    Last Pain:  Vitals:   09/05/22 1230  TempSrc:   PainSc: 0-No pain                 Lenard Simmer

## 2022-09-10 ENCOUNTER — Ambulatory Visit: Payer: Medicaid Other | Admitting: Podiatry

## 2022-09-10 ENCOUNTER — Encounter: Payer: Self-pay | Admitting: Podiatry

## 2022-09-10 DIAGNOSIS — M79675 Pain in left toe(s): Secondary | ICD-10-CM | POA: Diagnosis not present

## 2022-09-10 DIAGNOSIS — B351 Tinea unguium: Secondary | ICD-10-CM | POA: Diagnosis not present

## 2022-09-10 DIAGNOSIS — Q828 Other specified congenital malformations of skin: Secondary | ICD-10-CM

## 2022-09-10 DIAGNOSIS — B353 Tinea pedis: Secondary | ICD-10-CM

## 2022-09-10 DIAGNOSIS — E119 Type 2 diabetes mellitus without complications: Secondary | ICD-10-CM | POA: Diagnosis not present

## 2022-09-10 DIAGNOSIS — M79674 Pain in right toe(s): Secondary | ICD-10-CM

## 2022-09-10 DIAGNOSIS — M216X1 Other acquired deformities of right foot: Secondary | ICD-10-CM

## 2022-09-10 DIAGNOSIS — L84 Corns and callosities: Secondary | ICD-10-CM

## 2022-09-10 DIAGNOSIS — M216X2 Other acquired deformities of left foot: Secondary | ICD-10-CM

## 2022-09-10 DIAGNOSIS — E1142 Type 2 diabetes mellitus with diabetic polyneuropathy: Secondary | ICD-10-CM | POA: Diagnosis not present

## 2022-09-10 MED ORDER — KETOCONAZOLE 2 % EX CREA: TOPICAL_CREAM | CUTANEOUS | 1 refills | Status: DC

## 2022-09-10 NOTE — Progress Notes (Signed)
  Subjective:  Patient ID: Jay Flores, male    DOB: 09/08/1960,  MRN: [id number]  Jay Flores presents to clinic today for: for annual diabetic foot examination, at risk foot care. Patient has history of diabetes and HIV, and callus(es) both feet and painful thick toenails that are difficult to trim. Painful toenails interfere with ambulation. Aggravating factors include wearing enclosed shoe gear. Pain is relieved with periodic professional debridement. Painful calluses are aggravated when weightbearing with and without shoegear. Pain is relieved with periodic professional debridement.   PCP is Kothari, Whitney Muse, MD.  No Known Allergies  Review of Systems: Negative except as noted in the HPI.  Objective: No changes noted in today's physical examination. There were no vitals filed for this visit.  Jay Flores is a pleasant 62 y.o. male in NAD. AAO x 3.  Vascular Examination: Capillary refill time <3 seconds b/l LE. Palpable pedal pulses b/l LE. Digital hair present b/l. No pedal edema b/l. Skin temperature gradient WNL b/l. No varicosities b/l. No cyanosis or clubbing noted b/l LE.Marland Kitchen  Dermatological Examination: Pedal skin with normal turgor, texture and tone b/l. No open wounds. No interdigital macerations b/l. Toenails 1-5 b/l thickened, discolored, dystrophic with subungual debris. There is pain on palpation to dorsal aspect of nailplates. Porokeratotic lesion(s) submet head 5 b/l. No erythema, no edema, no drainage, no fluctuance. Diffuse scaling noted peripherally and plantarly b/l feet.  Mild foot odor. No interdigital macerations.  No blisters, no weeping. No signs of secondary bacterial infection noted..  Neurological Examination: Protective sensation diminished with 10g monofilament b/l.  Musculoskeletal Examination: Normal muscle strength 5/5 to all lower extremity muscle groups bilaterally. Plantarflexed metatarsal(s) 5th metatarsal head b/l lower extremities..  No pain, crepitus or joint limitation noted with ROM b/l LE.  Patient ambulates independently without assistive aids.  Assessment/Plan: 1. Pain due to onychomycosis of toenails of both feet   2. Porokeratosis   3. Tinea pedis of both feet   4. Plantarflexion deformity of both feet   5. Diabetic polyneuropathy associated with type 2 diabetes mellitus (HCC)   6. Encounter for diabetic foot exam [facility])     -Consent given for treatment as described below: -Examined patient. -Medicaid ABN signed for services of paring of corn(s)/callus(es)/porokeratos(es) today. Copy in patient chart. -Patient to continue soft, supportive shoe gear daily. -Toenails 1-5 b/l were debrided in length and girth with sterile nail nippers and dremel without iatrogenic bleeding.  -Porokeratotic lesion(s) submet head 5 b/l pared and enucleated with sterile currette without incident. Total number of lesions debrided=2. -Discussed tinea pedis infection. To prevent re-infection of tinea pedis, patient/POA/caregiver instructed to spray shoes with Lysol every evening and clean tub/shower with bleach based cleanser. -For tinea pedis, Rx sent to pharmacy for Ketoconazole Cream 2% to be applied once daily for six weeks. -Patient/POA to call should there be question/concern in the interim.   Return in about 3 months (around 12/11/2022).  Freddie Breech, DPM

## 2022-09-10 NOTE — Patient Instructions (Signed)
 To prevent reinfection, spray shoes with lysol every evening.  Clean tub or shower with bleach based cleanser.  Athlete's Foot Athlete's foot (tinea pedis) is a fungal infection of the skin on your feet. It often occurs on the skin that is between or underneath the toes. It can also occur on the soles of your feet. The infection can spread from person to person (is contagious). It can also spread when a person's bare feet come in contact with the fungus on shower floors or on items such as shoes. What are the causes? This condition is caused by a fungus that grows in warm, moist places. You can get athlete's foot by sharing shoes, shower stalls, towels, and wet floors with someone who is infected. Not washing your feet or changing your socks often enough can also lead to athlete's foot. What increases the risk? This condition is more likely to develop in: Men. People who have a weak body defense system (immune system). People who have diabetes. People who use public showers, such as at a gym. People who wear heavy-duty shoes, such as industrial or military shoes. Seasons with warm, humid weather. What are the signs or symptoms? Symptoms of this condition include: Itchy areas between your toes or on the soles of your feet. White, flaky, or scaly areas between your toes or on the soles of your feet. Very itchy small blisters between your toes or on the soles of your feet. Small cuts in your skin. These cuts can become infected. Thick or discolored toenails. How is this diagnosed? This condition may be diagnosed with a physical exam and a review of your medical history. Your health care provider may also take a skin or toenail sample to examine under a microscope. How is this treated? This condition is treated with antifungal medicines. These may be applied as powders, ointments, or creams. In severe cases, an oral antifungal medicine may be given. Follow these instructions at  home: Medicines Apply or take over-the-counter and prescription medicines only as told by your health care provider. Apply your antifungal medicine as told by your health care provider. Do not stop using the antifungal even if your condition improves. Foot care Do not scratch your feet. Keep your feet dry: Wear cotton or wool socks. Change your socks every day or if they become wet. Wear shoes that allow air to flow, such as sandals or canvas tennis shoes. Wash and dry your feet, including the area between your toes. Also, wash and dry your feet: Every day or as told by your health care provider. After exercising. General instructions Do not let others use towels, shoes, nail clippers, or other personal items that touch your feet. Protect your feet by wearing sandals in wet areas, such as locker rooms and shared showers. Keep all follow-up visits. This is important. If you have diabetes, keep your blood sugar under control. Contact a health care provider if: You have a fever. You have swelling, soreness, warmth, or redness in your foot. Your feet are not getting better with treatment. Your symptoms get worse. You have new symptoms. You have severe pain. Summary Athlete's foot (tinea pedis) is a fungal infection of the skin on your feet. It often occurs on skin that is between or underneath the toes. This condition is caused by a fungus that grows in warm, moist places. Symptoms include white, flaky, or scaly areas between your toes or on the soles of your feet. This condition is treated with antifungal medicines.   Keep your feet clean. Always dry them thoroughly. This information is not intended to replace advice given to you by your health care provider. Make sure you discuss any questions you have with your health care provider. Document Revised: 05/15/2020 Document Reviewed: 05/15/2020 Elsevier Patient Education  2024 [employer] Inc.  

## 2022-09-17 ENCOUNTER — Encounter: Payer: Self-pay | Admitting: Physician Assistant

## 2022-09-17 ENCOUNTER — Ambulatory Visit (INDEPENDENT_AMBULATORY_CARE_PROVIDER_SITE_OTHER): Payer: Medicaid Other | Admitting: Physician Assistant

## 2022-09-17 VITALS — BP 149/82 | HR 65 | Wt 137.7 lb

## 2022-09-17 DIAGNOSIS — R339 Retention of urine, unspecified: Secondary | ICD-10-CM | POA: Diagnosis not present

## 2022-09-17 DIAGNOSIS — Z466 Encounter for fitting and adjustment of urinary device: Secondary | ICD-10-CM

## 2022-09-17 NOTE — Progress Notes (Signed)
Cath Change/ Replacement  Patient is present today for a catheter change due to urinary retention.  8ml of water was removed from the balloon, a 18FR coude foley cath was removed without difficulty.  Patient was cleaned and prepped in a sterile fashion with betadine and 2% lidocaine jelly was instilled into the urethra. A 18 FR coude foley cath was replaced into the bladder, no complications were noted. Urine return was noted 30ml and urine was yellow in color. The balloon was filled with 10ml of sterile water. A leg bag was attached for drainage.  Patient tolerated well.    Performed by: Carman Ching, PA-C   Follow up: Return in about 4 weeks (around 10/15/2022) for Catheter exchange.

## 2022-10-18 ENCOUNTER — Ambulatory Visit (INDEPENDENT_AMBULATORY_CARE_PROVIDER_SITE_OTHER): Payer: Medicaid Other | Admitting: Physician Assistant

## 2022-10-18 ENCOUNTER — Other Ambulatory Visit: Payer: Self-pay

## 2022-10-18 DIAGNOSIS — N401 Enlarged prostate with lower urinary tract symptoms: Secondary | ICD-10-CM

## 2022-10-18 DIAGNOSIS — R339 Retention of urine, unspecified: Secondary | ICD-10-CM | POA: Diagnosis not present

## 2022-10-18 NOTE — Progress Notes (Signed)
Cath Change/ Replacement  Patient is present today for a catheter change due to urinary retention.  8ml of water was removed from the balloon, a 18FR coude foley cath was removed without difficulty.  Patient was cleaned and prepped in a sterile fashion with betadine and 2% lidocaine jelly was instilled into the urethra. A 18 FR coude foley cath was replaced into the bladder, no complications were noted. Urine return was noted 20ml and urine was yellow in color. The balloon was filled with 10ml of sterile water. A leg bag was attached for drainage.  Patient tolerated well.    Performed by: Carman Ching, PA-C   Additional notes: PSA drawn today prior to Foley change, will call with results.  Follow up: Return in about 4 weeks (around 11/15/2022) for Catheter exchange.

## 2022-10-19 LAB — PSA: Prostate Specific Ag, Serum: 8.2 ng/mL — ABNORMAL HIGH (ref 0.0–4.0)

## 2022-10-22 ENCOUNTER — Other Ambulatory Visit: Payer: Self-pay

## 2022-10-22 MED ORDER — LEVOFLOXACIN 500 MG PO TABS: 500.0000 mg | ORAL_TABLET | Freq: Every day | ORAL | 0 refills | Status: DC

## 2022-11-05 ENCOUNTER — Other Ambulatory Visit: Payer: Medicaid Other

## 2022-11-15 ENCOUNTER — Other Ambulatory Visit: Payer: Medicaid Other

## 2022-11-15 ENCOUNTER — Other Ambulatory Visit: Payer: Self-pay

## 2022-11-15 ENCOUNTER — Ambulatory Visit: Payer: Medicaid Other | Admitting: Physician Assistant

## 2022-11-15 DIAGNOSIS — N401 Enlarged prostate with lower urinary tract symptoms: Secondary | ICD-10-CM

## 2022-11-23 ENCOUNTER — Ambulatory Visit (INDEPENDENT_AMBULATORY_CARE_PROVIDER_SITE_OTHER): Payer: Medicaid Other | Admitting: Physician Assistant

## 2022-11-23 DIAGNOSIS — R972 Elevated prostate specific antigen [PSA]: Secondary | ICD-10-CM | POA: Diagnosis not present

## 2022-11-23 DIAGNOSIS — R339 Retention of urine, unspecified: Secondary | ICD-10-CM

## 2022-11-23 DIAGNOSIS — Z466 Encounter for fitting and adjustment of urinary device: Secondary | ICD-10-CM

## 2022-11-23 NOTE — Progress Notes (Signed)
Cath Change/ Replacement  Patient is present today for a catheter change due to urinary retention.  8ml of water was removed from the balloon, a 18FR coude foley cath was removed without difficulty.  Patient was cleaned and prepped in a sterile fashion with betadine and 2% lidocaine jelly was instilled into the urethra. A 18 FR coude foley cath was replaced into the bladder, no complications were noted. Urine return was noted 30ml and urine was yellow in color. The balloon was filled with 10ml of sterile water. A leg bag was attached for drainage.  Patient tolerated well.    Performed by: Carman Ching, PA-C   Additional notes: PSA last month came back significantly elevated over prior, 8.2, previously 3.8.  I sent him in 2 weeks of Levaquin.  Repeat PSA was drawn today prior to Foley exchange.  If not significantly down, will pursue prostate MRI.  He is in agreement with this plan.  Follow up: Return in about 4 weeks (around 12/21/2022) for Catheter exchange, Will call with results.

## 2022-11-24 LAB — PSA: Prostate Specific Ag, Serum: 8.4 ng/mL — ABNORMAL HIGH (ref 0.0–4.0)

## 2022-12-03 ENCOUNTER — Other Ambulatory Visit: Payer: Self-pay | Admitting: Physician Assistant

## 2022-12-03 DIAGNOSIS — R972 Elevated prostate specific antigen [PSA]: Secondary | ICD-10-CM

## 2022-12-10 ENCOUNTER — Encounter: Payer: Self-pay | Admitting: Podiatry

## 2022-12-10 ENCOUNTER — Ambulatory Visit (INDEPENDENT_AMBULATORY_CARE_PROVIDER_SITE_OTHER): Payer: Medicaid Other | Admitting: Podiatry

## 2022-12-10 DIAGNOSIS — M79675 Pain in left toe(s): Secondary | ICD-10-CM

## 2022-12-10 DIAGNOSIS — E1142 Type 2 diabetes mellitus with diabetic polyneuropathy: Secondary | ICD-10-CM

## 2022-12-10 DIAGNOSIS — Q828 Other specified congenital malformations of skin: Secondary | ICD-10-CM

## 2022-12-10 DIAGNOSIS — B351 Tinea unguium: Secondary | ICD-10-CM

## 2022-12-10 DIAGNOSIS — L84 Corns and callosities: Secondary | ICD-10-CM

## 2022-12-10 DIAGNOSIS — L601 Onycholysis: Secondary | ICD-10-CM | POA: Diagnosis not present

## 2022-12-10 DIAGNOSIS — M79674 Pain in right toe(s): Secondary | ICD-10-CM

## 2022-12-10 NOTE — Progress Notes (Unsigned)
  Subjective:  Patient ID: Jay Flores, male    DOB: Feb 25, 1960,  MRN: [id number]  62 y.o. male presents {jgcomplaint:23593}  No chief complaint on file.   New problem(s): None {jgcomplaint:23593}  PCP is Kothari, Whitney Muse, MD , and last visit was {Time; dates multiple:15870}.  No Known Allergies  Review of Systems: Negative except as noted in the HPI.   Objective:  Jay Flores is a pleasant 62 y.o. male {jgbodyhabitus:24098}. AAO x 3.  Vascular Examination: Vascular status intact b/l with palpable pedal pulses. CFT immediate b/l. Pedal hair present. No edema. No pain with calf compression b/l. Skin temperature gradient WNL b/l. No varicosities noted. No cyanosis or clubbing noted.  Neurological Examination: Sensation grossly intact b/l with 10 gram monofilament. Vibratory sensation intact b/l.  Dermatological Examination: Pedal skin with normal turgor, texture and tone b/l. No open wounds nor interdigital macerations noted. Toenails 1-5 b/l thick, discolored, elongated with subungual debris and pain on dorsal palpation.   {jgderm:23598}   Musculoskeletal Examination: Muscle strength 5/5 to b/l LE.  No pain, crepitus noted b/l. {jgmsk:23600}   Radiographs: None Last A1c:       No data to display         Assessment:   1. Pain due to onychomycosis of toenails of both feet   2. Porokeratosis   3. Callus   4. Diabetic polyneuropathy associated with type 2 diabetes mellitus (HCC)     Plan:  {jgplan:23602::"-Patient/POA to call should there be question/concern in the interim."}  Return in about 3 months (around 03/12/2023).  Freddie Breech, DPM

## 2022-12-14 ENCOUNTER — Ambulatory Visit
Admission: RE | Admit: 2022-12-14 | Discharge: 2022-12-14 | Disposition: A | Discharge status: Home / Self Care | Payer: Medicaid Other | Source: Ambulatory Visit | Attending: Physician Assistant | Admitting: Physician Assistant

## 2022-12-14 DIAGNOSIS — R972 Elevated prostate specific antigen [PSA]: Secondary | ICD-10-CM | POA: Insufficient documentation

## 2022-12-14 MED ORDER — GADOBUTROL 1 MMOL/ML IV SOLN
6.0000 mL | Freq: Once | INTRAVENOUS | Status: AC | PRN
  Administered 2022-12-14: 6 mL via INTRAVENOUS

## 2022-12-18 ENCOUNTER — Encounter: Payer: Self-pay | Admitting: Physician Assistant

## 2022-12-18 ENCOUNTER — Ambulatory Visit (INDEPENDENT_AMBULATORY_CARE_PROVIDER_SITE_OTHER): Payer: Medicaid Other | Admitting: Physician Assistant

## 2022-12-18 VITALS — BP 160/84 | HR 60

## 2022-12-18 DIAGNOSIS — Z466 Encounter for fitting and adjustment of urinary device: Secondary | ICD-10-CM

## 2022-12-18 DIAGNOSIS — R935 Abnormal findings on diagnostic imaging of other abdominal regions, including retroperitoneum: Secondary | ICD-10-CM

## 2022-12-18 DIAGNOSIS — R339 Retention of urine, unspecified: Secondary | ICD-10-CM

## 2022-12-18 NOTE — Progress Notes (Signed)
Cath Change/ Replacement  Patient is present today for a catheter change due to urinary retention.  8ml of water was removed from the balloon, a 18FR coude foley cath was removed without difficulty.  Patient was cleaned and prepped in a sterile fashion with betadine and 2% lidocaine jelly was instilled into the urethra. A 18 FR coude foley cath was replaced into the bladder, no complications were noted. Urine return was not noted but catheter hubbed easily. The balloon was filled with 10ml of sterile water. A leg bag was attached for drainage.  Patient tolerated well.    Performed by: Carman Ching, PA-C  Additional notes: We discussed prostate MRI findings including a PI-RADS 5 lesion and urothelial enhancement of the bladder. 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 I recommended cystoscopy for definitive evaluation of the bladder and discussion of prostate biopsy with Dr. Lonna Cobb and he agreed.  Follow up: Return in about 2 weeks (around 01/01/2023) for Cysto and discuss prostate biopsy with Dr. Lonna Cobb.

## 2023-01-10 ENCOUNTER — Ambulatory Visit: Payer: Medicaid Other | Admitting: Urology

## 2023-01-10 ENCOUNTER — Encounter: Payer: Self-pay | Admitting: Urology

## 2023-01-10 VITALS — BP 128/74 | HR 68 | Ht 72.0 in | Wt 137.0 lb

## 2023-01-10 DIAGNOSIS — N4289 Other specified disorders of prostate: Secondary | ICD-10-CM | POA: Diagnosis not present

## 2023-01-10 DIAGNOSIS — Z466 Encounter for fitting and adjustment of urinary device: Secondary | ICD-10-CM

## 2023-01-10 DIAGNOSIS — R339 Retention of urine, unspecified: Secondary | ICD-10-CM

## 2023-01-10 DIAGNOSIS — N3289 Other specified disorders of bladder: Secondary | ICD-10-CM

## 2023-01-10 DIAGNOSIS — R972 Elevated prostate specific antigen [PSA]: Secondary | ICD-10-CM

## 2023-01-10 DIAGNOSIS — R935 Abnormal findings on diagnostic imaging of other abdominal regions, including retroperitoneum: Secondary | ICD-10-CM

## 2023-01-10 MED ORDER — CIPROFLOXACIN HCL 500 MG PO TABS
500.0000 mg | ORAL_TABLET | Freq: Once | ORAL | Status: AC
Start: 2023-01-10 — End: 2023-01-10
  Administered 2023-01-10: 500 mg via ORAL

## 2023-01-10 NOTE — Progress Notes (Signed)
Cath Change/ Replacement  Patient is present today for a catheter change due to urinary retention.  8ml of water was removed from the balloon, a 18FR foley cath was removed without difficulty.  Patient was cleaned and prepped in a sterile fashion with betadine and 2% lidocaine jelly was instilled into the urethra. A 18 FR foley cath was replaced into the bladder, no complications were noted. Urine return was noted and urine was yellow in color. The balloon was filled with 10ml of sterile water. A leg bag was attached for drainage.  A night bag was also given to the patient and patient was given instruction on how to change from one bag to another. Patient was given proper instruction on catheter care.    Performed by: Ples Specter CMA

## 2023-01-10 NOTE — Progress Notes (Signed)
   01/10/23  CC:  Chief Complaint  Patient presents with   Cysto    HPI: Refer to S. Vaillancourt's note of 12/18/22. Cipro 00 mg po pre procedure  Blood pressure 128/74, pulse 68, height 6' (1.829 m), weight 137 lb (62.1 kg). NED. A&Ox3.   No respiratory distress   Abd soft, NT, ND Normal phallus with bilateral descended testicles  Cystoscopy Procedure Note  Patient identification was confirmed, informed consent was obtained, and patient was prepped using Betadine solution.  Lidocaine jelly was administered per urethral meatus.     Pre-Procedure: - Inspection reveals a normal caliber urethral meatus.  Procedure: The flexible cystoscope was introduced without difficulty - No urethral strictures/lesions are present. -  nonocclusive  prostate  - Normal bladder neck - Bilateral ureteral orifices identified - Bladder mucosa reveals suboptimal visualization but no gross solid or papillary lesions - No bladder stones - No trabeculation  Retroflexion shows no abnormalities   Post-Procedure: - Patient tolerated the procedure well  Assessment/ Plan: Urine cytology sent If cytology negative will sched prostate bx    Riki Altes, MD

## 2023-01-15 ENCOUNTER — Ambulatory Visit: Payer: Medicaid Other | Admitting: Physician Assistant

## 2023-02-10 ENCOUNTER — Telehealth: Payer: Self-pay | Admitting: Urology

## 2023-02-10 NOTE — Telephone Encounter (Signed)
 Urine cytology showed no cells suspicious for malignancy.  His MRI was suspicious for prostate cancer.

## 2023-02-11 ENCOUNTER — Other Ambulatory Visit: Payer: Self-pay

## 2023-02-11 ENCOUNTER — Ambulatory Visit (INDEPENDENT_AMBULATORY_CARE_PROVIDER_SITE_OTHER): Payer: Medicaid Other | Admitting: Physician Assistant

## 2023-02-11 VITALS — BP 115/75 | HR 67

## 2023-02-11 DIAGNOSIS — R339 Retention of urine, unspecified: Secondary | ICD-10-CM

## 2023-02-11 DIAGNOSIS — N402 Nodular prostate without lower urinary tract symptoms: Secondary | ICD-10-CM | POA: Diagnosis not present

## 2023-02-11 DIAGNOSIS — R972 Elevated prostate specific antigen [PSA]: Secondary | ICD-10-CM

## 2023-02-11 DIAGNOSIS — Z466 Encounter for fitting and adjustment of urinary device: Secondary | ICD-10-CM

## 2023-02-11 LAB — URINALYSIS, COMPLETE
Bilirubin, UA: NEGATIVE
Ketones, UA: NEGATIVE
Nitrite, UA: POSITIVE — AB
Specific Gravity, UA: 1.02 (ref 1.005–1.030)
Urobilinogen, Ur: 0.2 mg/dL (ref 0.2–1.0)
pH, UA: 5.5 (ref 5.0–7.5)

## 2023-02-11 LAB — MICROSCOPIC EXAMINATION: WBC, UA: >30 /[HPF] — AB (ref 0–5)

## 2023-02-11 NOTE — Telephone Encounter (Signed)
 Notified patient as instructed, Sam is seeing patient in office today

## 2023-02-11 NOTE — Progress Notes (Signed)
 Cath Change/ Replacement  Patient is present today for a catheter change due to urinary retention.  8ml of water was removed from the balloon, a 18FR coude foley cath was removed without difficulty.  Patient was cleaned and prepped in a sterile fashion with betadine and 2% lidocaine  jelly was instilled into the urethra. A 18 FR coude foley cath was replaced into the bladder, no complications were noted. Urine return was noted 10ml and urine was yellow in color. The balloon was filled with 10ml of sterile water. A leg bag was attached for drainage.  Patient tolerated well.    Performed by: Marlie Kuennen, PA-C   Additional notes: We discussed need for prostate biopsy per MR results. With chronic Foley, Dr. Twylla recommends cognitive biopsy in the OR with IV abx to reduce his risk for infection. He is in agreement with this plan. Urine sample obtained from new Foley today for preop UA/cx.  Follow up: Return in about 2 weeks (around 02/25/2023) for Prostate biopsy under anesthesia with Dr. Twylla.

## 2023-02-11 NOTE — Progress Notes (Signed)
 Surgical Physician Order Form [facility] [place]  Dr. Twylla * Scheduling expectation : Next Available  *Length of Case:   *Clearance needed: no  *Anticoagulation Instructions: N/A  *Aspirin Instructions: N/A  *Post-op visit Date/Instructions:  1-2 week with pathology review  *Diagnosis: Elevated PSA  *Procedure: Prostate Biopsy (55700) TRUS (23127) US  Hlpiz(23057) Nerve block (35549)   Additional orders: N/A  -Admit type: OUTpatient  -Anesthesia: General  -VTE Prophylaxis Standing Order SCD's       Other:   -Standing Lab Orders Per Anesthesia    Lab other: None  -Standing Test orders EKG/Chest x-ray per Anesthesia       Test other:   - Medications:  Ceftriaxone (Rocephin ) 1gm IV  -Other orders:  Fleets enema AM

## 2023-02-12 ENCOUNTER — Telehealth: Payer: Self-pay

## 2023-02-12 NOTE — Progress Notes (Signed)
   [facility] Surgical Posting Form  Surgery Date: Date: 02/26/2023  Surgeon: Dr. Glendia Barba, MD  Inpt ( No  )   Outpt (Yes)   Obs ( No  )   Diagnosis: R97.20 Elevated Prostate Specific Antigen  -CPT: 44299,23127, [phone], [phone]  Surgery: Transrectal Ultrasound Guided Prostate Biopsy  Stop Anticoagulations: No  Cardiac/Medical/Pulmonary Clearance needed: no  *Orders entered into EPIC  Date: 02/12/23   *Case booked in [place]  Date: 02/11/2023  *Notified pt of Surgery: Date: 02/11/2023  PRE-OP UA & CX: no  *Placed into Prior Authorization Work Que Date: 02/12/23  Assistant/laser/rep:No

## 2023-02-12 NOTE — Telephone Encounter (Signed)
  Per Dr. Twylla,  Patient is to be scheduled for Transrectal Ultrasound Guided Prostate Biopsy   Mr. Jay Flores and caregiver [name] was contacted and possible surgical dates were discussed, Tuesday January 21st, 2025 was agreed upon for surgery.   Patient was directed to call [phone] between 1-3pm the day before surgery to find out surgical arrival time.  Instructions were given not to eat or drink from midnight on the night before surgery and have a driver for the day of surgery. On the surgery day patient was instructed to enter through the Medical Mall entrance of [facility] report the Same Day Surgery desk.   Pre-Admit Testing will be in contact via phone to set up an interview with the anesthesia team to review your history and medications prior to surgery.   Reminder of this information was sent via Fax to the patient.

## 2023-02-14 ENCOUNTER — Other Ambulatory Visit: Payer: Self-pay | Admitting: Urology

## 2023-02-14 DIAGNOSIS — R972 Elevated prostate specific antigen [PSA]: Secondary | ICD-10-CM

## 2023-02-14 LAB — CULTURE, URINE COMPREHENSIVE

## 2023-02-15 ENCOUNTER — Other Ambulatory Visit: Payer: Self-pay

## 2023-02-15 MED ORDER — FLEET ENEMA RE ENEM: 1.0000 | ENEMA | Freq: Once | RECTAL | 0 refills | Status: AC

## 2023-02-15 MED ORDER — AMOXICILLIN 500 MG PO CAPS: 500.0000 mg | ORAL_CAPSULE | Freq: Two times a day (BID) | ORAL | 0 refills | Status: DC

## 2023-02-18 ENCOUNTER — Other Ambulatory Visit: Payer: Self-pay

## 2023-02-18 MED ORDER — AMOXICILLIN 500 MG PO CAPS: 500.0000 mg | ORAL_CAPSULE | Freq: Two times a day (BID) | ORAL | 0 refills | Status: DC

## 2023-02-20 ENCOUNTER — Encounter
Admission: RE | Admit: 2023-02-20 | Discharge: 2023-02-20 | Disposition: A | Discharge status: Home / Self Care | Payer: Medicaid Other | Source: Ambulatory Visit | Attending: Urology | Admitting: Urology

## 2023-02-20 VITALS — Ht 73.0 in | Wt 136.9 lb

## 2023-02-20 DIAGNOSIS — Z01812 Encounter for preprocedural laboratory examination: Secondary | ICD-10-CM

## 2023-02-20 DIAGNOSIS — E1169 Type 2 diabetes mellitus with other specified complication: Secondary | ICD-10-CM

## 2023-02-20 DIAGNOSIS — D649 Anemia, unspecified: Secondary | ICD-10-CM

## 2023-02-20 DIAGNOSIS — Z01818 Encounter for other preprocedural examination: Secondary | ICD-10-CM

## 2023-02-20 DIAGNOSIS — I10 Essential (primary) hypertension: Secondary | ICD-10-CM

## 2023-02-20 DIAGNOSIS — N1832 Chronic kidney disease, stage 3b: Secondary | ICD-10-CM

## 2023-02-20 DIAGNOSIS — Z01810 Encounter for preprocedural cardiovascular examination: Secondary | ICD-10-CM

## 2023-02-20 DIAGNOSIS — D696 Thrombocytopenia, unspecified: Secondary | ICD-10-CM

## 2023-02-20 HISTORY — DX: Acidosis, unspecified: E87.20

## 2023-02-20 HISTORY — DX: Other specified health status: Z78.9

## 2023-02-20 HISTORY — DX: Neoplasm of unspecified behavior of digestive system: D49.0

## 2023-02-20 HISTORY — DX: Dementia in other diseases classified elsewhere, unspecified severity, without behavioral disturbance, psychotic disturbance, mood disturbance, and anxiety: B20

## 2023-02-20 HISTORY — DX: Cyst of pancreas: K86.2

## 2023-02-20 HISTORY — DX: Disorder of brain, unspecified: G93.9

## 2023-02-20 HISTORY — DX: Type 2 diabetes mellitus without complications: E11.9

## 2023-02-20 HISTORY — DX: Chronic kidney disease, stage 3 unspecified: N18.30

## 2023-02-20 HISTORY — DX: Tobacco use: Z72.0

## 2023-02-20 HISTORY — DX: Thrombocytopenia, unspecified: D69.6

## 2023-02-20 HISTORY — DX: Unsteadiness on feet: R26.81

## 2023-02-20 HISTORY — DX: Calculus of gallbladder without cholecystitis without obstruction: K80.20

## 2023-02-20 HISTORY — DX: Personal history of other specified conditions: Z87.898

## 2023-02-20 HISTORY — DX: Type 2 diabetes mellitus with diabetic neuropathy, unspecified: E11.40

## 2023-02-20 HISTORY — DX: Pneumonia, unspecified organism: J18.9

## 2023-02-20 HISTORY — DX: Unspecified viral hepatitis C without hepatic coma: B19.20

## 2023-02-20 HISTORY — DX: Anemia, unspecified: D64.9

## 2023-02-20 HISTORY — DX: Presence of other specified devices: Z97.8

## 2023-02-20 HISTORY — DX: Elevated prostate specific antigen (PSA): R97.20

## 2023-02-20 HISTORY — DX: Type 2 diabetes mellitus with ketoacidosis without coma: E11.10

## 2023-02-20 NOTE — Patient Instructions (Addendum)
Your procedure is scheduled on:02-26-23 Tuesday Report to the Registration Desk on the 1st floor of the Medical Mall.Then proceed to the 2nd floor Surgery Desk To find out your arrival time, please call [phone] between 1PM - 3PM on:02-25-23 Monday If your arrival time is 6:00 am, do not arrive before that time as the Medical Mall entrance doors do not open until 6:00 am.  REMEMBER: Instructions that are not followed completely may result in serious medical risk, up to and including death; or upon the discretion of your surgeon and anesthesiologist your surgery may need to be rescheduled.  Do not eat food OR drink any liquids after midnight the night before surgery.  No gum chewing or hard candies.  One week prior to surgery:Stop NOW (02-20-23) Stop Anti-inflammatories (NSAIDS) such as Advil, Aleve, Ibuprofen, Motrin, Naproxen, Naprosyn and Aspirin based products such as Excedrin, Goody's Powder, BC Powder. Stop ANY OVER THE COUNTER supplements until after surgery (Vitamin D3)  You may however, continue to take Tylenol if needed for pain up until the day of surgery.  Stop JARDIANCE 3 days prior to surgery-Last dose will be on 02-22-23 Friday  Continue taking all of your other prescription medications up until the day of surgery.  ON THE DAY OF SURGERY ONLY TAKE THESE MEDICATIONS WITH SIPS OF WATER: -Dolutegravir-Rilpivirine (JULUCA)  -finasteride (PROSCAR)  -sodium bicarbonate  -metoprolol succinate (TOPROL-XL)   Do Fleet Enema at home the morning of surgery 2 hours prior to arrival time to hospital (Enema instructions enclosed)  No Alcohol for 24 hours before or after surgery.  No Smoking including e-cigarettes for 24 hours before surgery.  No chewable tobacco products for at least 6 hours before surgery.  No nicotine patches on the day of surgery.  Do not use any "recreational" drugs for at least a week (preferably 2 weeks) before your surgery.  Please be advised that the  combination of cocaine and anesthesia may have negative outcomes, up to and including death. If you test positive for cocaine, your surgery will be cancelled.  On the morning of surgery brush your teeth with toothpaste and water, you may rinse your mouth with mouthwash if you wish. Do not swallow any toothpaste or mouthwash.  Do not wear jewelry, make-up, hairpins, clips or nail polish.  For welded (permanent) jewelry: bracelets, anklets, waist bands, etc.  Please have this removed prior to surgery.  If it is not removed, there is a chance that hospital personnel will need to cut it off on the day of surgery.  Do not wear lotions, powders, or perfumes.   Do not shave body hair from the neck down 48 hours before surgery.  Contact lenses, hearing aids and dentures may not be worn into surgery.  Do not bring valuables to the hospital. [facility] is not responsible for any missing/lost belongings or valuables.   Notify your doctor if there is any change in your medical condition (cold, fever, infection).  Wear comfortable clothing (specific to your surgery type) to the hospital.  After surgery, you can help prevent lung complications by doing breathing exercises.  Take deep breaths and cough every 1-2 hours. Your doctor may order a device called an Incentive Spirometer to help you take deep breaths. When coughing or sneezing, hold a pillow firmly against your incision with both hands. This is called "splinting." Doing this helps protect your incision. It also decreases belly discomfort.  If you are being admitted to the hospital overnight, leave your suitcase in the  car. After surgery it may be brought to your room.  In case of increased patient census, it may be necessary for you, the patient, to continue your postoperative care in the Same Day Surgery department.  If you are being discharged the day of surgery, you will not be allowed to drive home. You will need a responsible  individual to drive you home and stay with you for 24 hours after surgery.   If you are taking public transportation, you will need to have a responsible individual with you.  Please call the Pre-admissions Testing Dept. at [phone] if you have any questions about these instructions.  Surgery Visitation Policy:  Patients having surgery or a procedure may have two visitors.  Children under the age of 103 must have an adult with them who is not the patient.  Temporary Visitor Restrictions Due to increasing cases of flu, RSV and COVID-19: Children ages 10 and under will not be able to visit patients in [facility] hospitals under most circumstances.

## 2023-02-21 ENCOUNTER — Encounter
Admission: RE | Admit: 2023-02-21 | Discharge: 2023-02-21 | Disposition: A | Discharge status: Home / Self Care | Payer: Medicaid Other | Source: Ambulatory Visit | Attending: Urology | Admitting: Urology

## 2023-02-21 DIAGNOSIS — N1832 Chronic kidney disease, stage 3b: Secondary | ICD-10-CM | POA: Diagnosis not present

## 2023-02-21 DIAGNOSIS — D631 Anemia in chronic kidney disease: Secondary | ICD-10-CM | POA: Insufficient documentation

## 2023-02-21 DIAGNOSIS — Z01812 Encounter for preprocedural laboratory examination: Secondary | ICD-10-CM | POA: Insufficient documentation

## 2023-02-21 DIAGNOSIS — E1122 Type 2 diabetes mellitus with diabetic chronic kidney disease: Secondary | ICD-10-CM | POA: Diagnosis not present

## 2023-02-21 DIAGNOSIS — I129 Hypertensive chronic kidney disease with stage 1 through stage 4 chronic kidney disease, or unspecified chronic kidney disease: Secondary | ICD-10-CM | POA: Diagnosis not present

## 2023-02-21 DIAGNOSIS — E1169 Type 2 diabetes mellitus with other specified complication: Secondary | ICD-10-CM

## 2023-02-21 DIAGNOSIS — D649 Anemia, unspecified: Secondary | ICD-10-CM

## 2023-02-21 DIAGNOSIS — Z01810 Encounter for preprocedural cardiovascular examination: Secondary | ICD-10-CM | POA: Diagnosis not present

## 2023-02-21 DIAGNOSIS — D696 Thrombocytopenia, unspecified: Secondary | ICD-10-CM

## 2023-02-21 DIAGNOSIS — I10 Essential (primary) hypertension: Secondary | ICD-10-CM

## 2023-02-21 HISTORY — DX: Cocaine use, unspecified, in remission: F14.91

## 2023-02-21 HISTORY — DX: Obstructive sleep apnea (adult) (pediatric): G47.33

## 2023-02-21 HISTORY — DX: Chronic obstructive pulmonary disease, unspecified: J44.9

## 2023-02-21 LAB — CBC
HCT: 43.7 % (ref 39.0–52.0)
Hemoglobin: 14.7 g/dL (ref 13.0–17.0)
MCH: 31.9 pg (ref 26.0–34.0)
MCHC: 33.6 g/dL (ref 30.0–36.0)
MCV: 94.8 fL (ref 80.0–100.0)
Platelets: 134 10*3/uL — ABNORMAL LOW (ref 150–400)
RBC: 4.61 MIL/uL (ref 4.22–5.81)
RDW: 12.4 % (ref 11.5–15.5)
WBC: 6.7 10*3/uL (ref 4.0–10.5)
nRBC: 0 % (ref 0.0–0.2)

## 2023-02-21 LAB — BASIC METABOLIC PANEL
Anion gap: 10 (ref 5–15)
BUN: 30 mg/dL — ABNORMAL HIGH (ref 8–23)
CO2: 21 mmol/L — ABNORMAL LOW (ref 22–32)
Calcium: 8.8 mg/dL — ABNORMAL LOW (ref 8.9–10.3)
Chloride: 111 mmol/L (ref 98–111)
Creatinine, Ser: 2.79 mg/dL — ABNORMAL HIGH (ref 0.61–1.24)
GFR, Estimated: 25 mL/min — ABNORMAL LOW (ref >60)
Glucose, Bld: 96 mg/dL (ref 70–99)
Potassium: 4.6 mmol/L (ref 3.5–5.1)
Sodium: 142 mmol/L (ref 135–145)

## 2023-02-21 LAB — HEMOGLOBIN A1C
Hgb A1c MFr Bld: 6.3 % — ABNORMAL HIGH (ref 4.8–5.6)
Mean Plasma Glucose: 134.11 mg/dL

## 2023-02-22 NOTE — Progress Notes (Addendum)
Spoke with Counsellor at [facility]- Merry Proud states she had sent MAR's over to Pharmacy at [place]. All medications have been reconciled by pharmacy. I faxed over surgery instructions to [facility]. Informed her of NPO status after MN Monday night and that pts Jardiance needs to be stopped after today (02-22-23). Merry Proud is aware pt needs to do Fleet Enema day of surgery 2 hours prior to arrival time to hospital. Merry Proud states pt has a HC POA ( [name]-paperwork on chart) but Merry Proud states that pt signs his own consents still. Brandi states that [employer] is aware that pt is having surgery.

## 2023-02-26 ENCOUNTER — Ambulatory Visit: Payer: Medicaid Other | Admitting: Urgent Care

## 2023-02-26 ENCOUNTER — Other Ambulatory Visit: Payer: Self-pay

## 2023-02-26 ENCOUNTER — Encounter
Admission: RE | Disposition: A | Discharge status: Skilled Nursing Facility | Payer: Self-pay | Source: Ambulatory Visit | Attending: Urology

## 2023-02-26 ENCOUNTER — Ambulatory Visit
Admission: RE | Admit: 2023-02-26 | Discharge: 2023-02-26 | Disposition: A | Discharge status: Skilled Nursing Facility | Payer: Medicaid Other | Source: Ambulatory Visit | Attending: Urology | Admitting: Urology

## 2023-02-26 ENCOUNTER — Ambulatory Visit
Admission: RE | Admit: 2023-02-26 | Discharge: 2023-02-26 | Disposition: A | Discharge status: Home / Self Care | Payer: Medicaid Other | Source: Ambulatory Visit | Attending: Urology | Admitting: Urology

## 2023-02-26 ENCOUNTER — Ambulatory Visit: Payer: Medicaid Other

## 2023-02-26 ENCOUNTER — Encounter: Payer: Self-pay | Admitting: Urology

## 2023-02-26 DIAGNOSIS — N183 Chronic kidney disease, stage 3 unspecified: Secondary | ICD-10-CM | POA: Insufficient documentation

## 2023-02-26 DIAGNOSIS — I129 Hypertensive chronic kidney disease with stage 1 through stage 4 chronic kidney disease, or unspecified chronic kidney disease: Secondary | ICD-10-CM | POA: Diagnosis not present

## 2023-02-26 DIAGNOSIS — F0390 Unspecified dementia without behavioral disturbance: Secondary | ICD-10-CM | POA: Diagnosis not present

## 2023-02-26 DIAGNOSIS — R972 Elevated prostate specific antigen [PSA]: Secondary | ICD-10-CM | POA: Insufficient documentation

## 2023-02-26 DIAGNOSIS — C61 Malignant neoplasm of prostate: Secondary | ICD-10-CM | POA: Insufficient documentation

## 2023-02-26 DIAGNOSIS — Z01818 Encounter for other preprocedural examination: Secondary | ICD-10-CM

## 2023-02-26 DIAGNOSIS — R9389 Abnormal findings on diagnostic imaging of other specified body structures: Secondary | ICD-10-CM | POA: Insufficient documentation

## 2023-02-26 DIAGNOSIS — Z21 Asymptomatic human immunodeficiency virus [HIV] infection status: Secondary | ICD-10-CM | POA: Diagnosis not present

## 2023-02-26 DIAGNOSIS — E1122 Type 2 diabetes mellitus with diabetic chronic kidney disease: Secondary | ICD-10-CM | POA: Insufficient documentation

## 2023-02-26 DIAGNOSIS — F17210 Nicotine dependence, cigarettes, uncomplicated: Secondary | ICD-10-CM | POA: Diagnosis not present

## 2023-02-26 HISTORY — PX: TRANSRECTAL ULTRASOUND: SHX5146

## 2023-02-26 HISTORY — PX: PROSTATE BIOPSY: SHX241

## 2023-02-26 LAB — GLUCOSE, CAPILLARY
Glucose-Capillary: 142 mg/dL — ABNORMAL HIGH (ref 70–99)
Glucose-Capillary: 117 mg/dL — ABNORMAL HIGH (ref 70–99)

## 2023-02-26 SURGERY — BIOPSY, PROSTATE
Anesthesia: General

## 2023-02-26 MED ORDER — SODIUM CHLORIDE 0.9 % IV SOLN: INTRAVENOUS | Status: DC

## 2023-02-26 MED ORDER — FLEET ENEMA RE ENEM: 1.0000 | ENEMA | Freq: Once | RECTAL | Status: DC

## 2023-02-26 MED ORDER — SODIUM CHLORIDE 0.9 % IV SOLN
1.0000 g | INTRAVENOUS | Status: AC
  Administered 2023-02-26: 1 g via INTRAVENOUS
  Filled 2023-02-26 (×2): qty 10

## 2023-02-26 MED ORDER — FENTANYL CITRATE (PF) 100 MCG/2ML IJ SOLN
INTRAMUSCULAR | Status: AC
  Filled 2023-02-26: qty 2

## 2023-02-26 MED ORDER — PROPOFOL 500 MG/50ML IV EMUL
INTRAVENOUS | Status: DC | PRN
  Administered 2023-02-26: 200 ug/kg/min via INTRAVENOUS
  Administered 2023-02-26: 100 ug/kg/min via INTRAVENOUS

## 2023-02-26 MED ORDER — CHLORHEXIDINE GLUCONATE 0.12 % MT SOLN
15.0000 mL | Freq: Once | OROMUCOSAL | Status: AC
  Administered 2023-02-26: 15 mL via OROMUCOSAL

## 2023-02-26 MED ORDER — ORAL CARE MOUTH RINSE: 15.0000 mL | Freq: Once | OROMUCOSAL | Status: AC

## 2023-02-26 MED ORDER — CHLORHEXIDINE GLUCONATE 0.12 % MT SOLN
OROMUCOSAL | Status: AC
  Filled 2023-02-26: qty 15

## 2023-02-26 SURGICAL SUPPLY — 8 items
COVER MAYO STAND STRL (DRAPES) ×1 IMPLANT
DRSG TELFA 3X8 NADH STRL (GAUZE/BANDAGES/DRESSINGS) ×1 IMPLANT
GAUZE 4X4 16PLY ~~LOC~~+RFID DBL (SPONGE) ×1 IMPLANT
GLOVE BIOGEL PI IND STRL 7.5 (GLOVE) ×1 IMPLANT
INST BIOPSY MAXCORE 18GX25 (NEEDLE) ×1 IMPLANT
KIT TURNOVER CYSTO (KITS) ×1 IMPLANT
SURGILUBE 2OZ TUBE FLIPTOP (MISCELLANEOUS) ×1 IMPLANT
WATER STERILE IRR 500ML POUR (IV SOLUTION) ×1 IMPLANT

## 2023-02-26 NOTE — Op Note (Signed)
   Preoperative diagnosis:  Elevated PSA Abnormal prostate MRI  Postoperative diagnosis:  Elevated PSA Abnormal prostate MRI  Procedure: Transrectal ultrasound prostate Transrectal prostate biopsies  Surgeon: Riki Altes, MD  Anesthesia: MAC  Complications: None  Intraoperative findings:  Prostate dimensions: 3.58 x 2.58 x 3.85 cm Prostate volume: 18.6 cc Right PZ hypoechoic from base-apex  EBL: Minimal  Specimens: Standard 12 core biopsies plus right digitally directed biopsy  Indication: Jay Flores is a 63 y.o. patient with an elevated PSA in the 8 range and prostate MRI showing PI-RADS 5 lesion right PZ.  After reviewing the management options for treatment, he elected to proceed with the above surgical procedure(s). We have discussed the potential benefits and risks of the procedure, side effects of the proposed treatment, the likelihood of the patient achieving the goals of the procedure, and any potential problems that might occur during the procedure or recuperation. Informed consent has been obtained.  Description of procedure:  The patient was taken to the operating room and was not transferred to the OR table.  He remained on the OR stretcher and was placed in the left lateral decubitus position.  IV sedation was obtained by anesthesia.  DRE was performed and the right prostate was diffusely firm.  A transrectal ultrasound probe with needle guide was lubricated and gently inserted per rectum.  Prostate ultrasound was performed and transverse and sagittal planes with findings as described above.  Standard 12 core biopsies were then performed under ultrasound guidance.  A right prostate digitally directed biopsy was then performed and included in the right base specimen no significant bleeding was noted.  He was then transferred to the PACU in stable condition.  Plan: He will be contacted with the biopsy results   Riki Altes, M.D.

## 2023-02-26 NOTE — Transfer of Care (Signed)
Immediate Anesthesia Transfer of Care Note  Patient: Jay Flores  Procedure(s) Performed: PROSTATE BIOPSY TRANSRECTAL ULTRASOUND  Patient Location: PACU  Anesthesia Type:General  Level of Consciousness: drowsy  Airway & Oxygen Therapy: Patient Spontanous Breathing  Post-op Assessment: Report given to RN and Post -op Vital signs reviewed and stable  Post vital signs: Reviewed and stable  Last Vitals:  Vitals Value Taken Time  BP 97/70 02/26/23 0904  Temp 36.2 C 02/26/23 0904  Pulse 76 02/26/23 0908  Resp 24 02/26/23 0908  SpO2 98 % 02/26/23 0908  Vitals shown include unfiled device data.  Last Pain:  Vitals:   02/26/23 0904  TempSrc:   PainSc: Asleep         Complications: There were no known notable events for this encounter.

## 2023-02-26 NOTE — Interval H&P Note (Signed)
History and Physical Interval Note:  02/26/2023 8:38 AM  Jay Flores  has presented today for surgery, with the diagnosis of Elevated Prostate Specific Antigen.  The various methods of treatment have been discussed with the patient and family. After consideration of risks, benefits and other options for treatment, the patient has consented to  Procedure(s): PROSTATE BIOPSY (N/A) TRANSRECTAL ULTRASOUND (N/A) as a surgical intervention.  The patient's history has been reviewed, patient examined, no change in status, stable for surgery.  I have reviewed the patient's chart and labs.  Questions were answered to the patient's satisfaction.     Brandis Matsuura C Diquan Kassis

## 2023-02-26 NOTE — Anesthesia Postprocedure Evaluation (Signed)
Anesthesia Post Note  Patient: Jay Flores  Procedure(s) Performed: PROSTATE BIOPSY TRANSRECTAL ULTRASOUND  Patient location during evaluation: PACU Anesthesia Type: General Level of consciousness: awake and alert Pain management: pain level controlled Vital Signs Assessment: post-procedure vital signs reviewed and stable Respiratory status: spontaneous breathing, nonlabored ventilation, respiratory function stable and patient connected to nasal cannula oxygen Cardiovascular status: blood pressure returned to baseline and stable Postop Assessment: no apparent nausea or vomiting Anesthetic complications: no   There were no known notable events for this encounter.   Last Vitals:  Vitals:   02/26/23 0930 02/26/23 0943  BP: 113/73 134/75  Pulse: (!) 58 (!) 53  Resp: 19 17  Temp: (!) 36.2 C (!) 36.1 C  SpO2: 99% 100%    Last Pain:  Vitals:   02/26/23 0943  TempSrc: Tympanic  PainSc: 0-No pain                 Corinda Gubler

## 2023-02-26 NOTE — H&P (Signed)
Urology H&P   History of Present Illness: Jay Flores is a 63 y.o. male with chronic urinary retention managed with an indwelling Foley catheter.  History elevated PSA 8.4 and prostate MRI showing a PI-RADS 5 lesion right posterolateral/posteromedial PZ extending from base to apex.  Due to chronic indwelling Foley and chronic bacteriuria biopsy being performed in OR with IV antibiotics.  Preoperative urine culture grew Enterococcus which has been treated.  Cognitive biopsy will be performed.  Past Medical History:  Diagnosis Date   Acute renal failure (HCC)    Altered mental status 01/20/2012   Anemia    Brain lesion    Cholelithiasis    Chronic indwelling Foley catheter    CKD (chronic kidney disease) stage 3, GFR 30-59 ml/min (HCC)    COPD (chronic obstructive pulmonary disease) (HCC)    Diabetic neuropathy (HCC)    DKA (diabetic ketoacidosis) (HCC)    DM (diabetes mellitus), type 2 (HCC)    Elevated PSA    Gait instability    uses walker   H/O urinary retention    Hepatitis C infection    s/p Harvoni   History of cocaine use    last used in 2012   HIV (human immunodeficiency virus infection) (HCC)    HIV dementia (HCC)    Hypertension    IPMN (intraductal papillary mucinous neoplasm)    Lives in group home    [facility]   Metabolic acidosis    Pancreatic cyst    Pneumonia    Thrombocytopenia (HCC)    Tobacco use     Past Surgical History:  Procedure Laterality Date   COLONOSCOPY WITH PROPOFOL N/A 09/05/2022   Procedure: COLONOSCOPY WITH PROPOFOL;  Surgeon: Toledo, Boykin Nearing, MD;  Location: [facility] ENDOSCOPY;  Service: Gastroenterology;  Laterality: N/A;   EUS N/A 04/19/2022   Procedure: UPPER ENDOSCOPIC ULTRASOUND (EUS) LINEAR;  Surgeon: Doren Custard, MD;  Location: [facility] ENDOSCOPY;  Service: Gastroenterology;  Laterality: N/A;  Requesting 1p slot   HEMOSTASIS CLIP PLACEMENT  09/05/2022   Procedure: HEMOSTASIS CLIP PLACEMENT;   Surgeon: Norma Fredrickson, Boykin Nearing, MD;  Location: [facility] ENDOSCOPY;  Service: Gastroenterology;;   POLYPECTOMY  09/05/2022   Procedure: POLYPECTOMY;  Surgeon: Toledo, Boykin Nearing, MD;  Location: [facility] ENDOSCOPY;  Service: Gastroenterology;;    Home Medications:  Current Meds  Medication Sig   acetaminophen (TYLENOL 8 HOUR ARTHRITIS PAIN) 650 MG CR tablet Take 650 mg by mouth every 4 (four) hours as needed for pain.   atorvastatin (LIPITOR) 20 MG tablet Take 20 mg by mouth at bedtime.   cholecalciferol (VITAMIN D3) 25 MCG (1000 UNIT) tablet Take 1,000 Units by mouth at bedtime.   Dolutegravir-Rilpivirine (JULUCA) 50-25 MG TABS Take 1 tablet by mouth daily.   finasteride (PROSCAR) 5 MG tablet Take 5 mg by mouth daily.   JARDIANCE 25 MG TABS tablet Take 25 mg by mouth daily.   losartan (COZAAR) 100 MG tablet Take 100 mg by mouth daily.   metoprolol succinate (TOPROL-XL) 100 MG 24 hr tablet Take 100 mg by mouth daily.   sodium bicarbonate 650 MG tablet Take 650 mg by mouth 2 (two) times daily.    Allergies: No Known Allergies  Family History  Problem Relation Age of Onset   Diabetes Mellitus II Mother     Social History:  reports that he has been smoking cigarettes. He has a 34 pack-year smoking history. He has been exposed to tobacco smoke. He has never used smokeless  tobacco. He reports that he does not currently use drugs after having used the following drugs: Cocaine. He reports that he does not drink alcohol.  ROS: No fever, chills, shortness of breath  Physical Exam:  Vital signs in last 24 hours: Temp:  [97.6 F (36.4 C)] 97.6 F (36.4 C) (01/21 0816) Pulse Rate:  [61] 61 (01/21 0816) Resp:  [18] 18 (01/21 0816) BP: (119)/(91) 119/91 (01/21 0816) SpO2:  [61 %] 61 % (01/21 0816) Weight:  [62.1 kg] 62.1 kg (01/21 0816) Constitutional:  Alert, No acute distress HEENT: Central AT Cardiovascular: Regular rate and rhythm Respiratory: Normal respiratory effort, lungs clear  bilaterally   Laboratory Data:  No results for input(s): "WBC", "HGB", "HCT" in the last 72 hours. No results for input(s): "NA", "K", "CL", "CO2", "GLUCOSE", "BUN", "CREATININE", "CALCIUM" in the last 72 hours. No results for input(s): "LABPT", "INR" in the last 72 hours. No results for input(s): "LABURIN" in the last 72 hours. Results for orders placed or performed in visit on 02/11/23  Microscopic Examination     Status: Abnormal   Collection Time: 02/11/23 11:23 AM   Urine  Result Value Ref Range Status   WBC, UA >30 (A) 0 - 5 /hpf Final   RBC, Urine 11-30 (A) 0 - 2 /hpf Final   Epithelial Cells (non renal) 0-10 0 - 10 /hpf Final   Crystals Present (A) N/A Final   Crystal Type Amorphous Sediment N/A Final   Mucus, UA Present (A) Not Estab. Final   Bacteria, UA Many (A) None seen/Few Final  CULTURE, URINE COMPREHENSIVE     Status: Abnormal   Collection Time: 02/11/23 11:25 AM   Specimen: Urine   UR  Result Value Ref Range Status   Urine Culture, Comprehensive Final report (A)  Final   Organism ID, Bacteria Enterococcus faecalis (A)  Final    Comment: For Enterococcus species, aminoglycosides (except for high-level resistance screening), cephalosporins, clindamycin, and trimethoprim-sulfamethoxazole are not effective clinically. (CLSI, M100-S26, 2016) 50,000-100,000 colony forming units per mL    Organism ID, Bacteria Not applicable  Final   ANTIMICROBIAL SUSCEPTIBILITY Comment  Final    Comment:       ** S = Susceptible; I = Intermediate; R = Resistant **                    P = Positive; N = Negative             MICS are expressed in micrograms per mL    Antibiotic                 RSLT#1    RSLT#2    RSLT#3    RSLT#4 Ciprofloxacin                  R Levofloxacin                   R Nitrofurantoin                 S Penicillin                     S Tetracycline                   R Vancomycin                     S     Assessment/Recommendations: Elevated PSA and  PI-RADS 5 lesion on prostate MRI for  cognitive biopsy under sedation The procedure has been discussed in detail including potential risks of bleeding and infection/sepsis.   02/26/2023, 8:34 AM  Irineo Axon,  MD

## 2023-02-26 NOTE — Anesthesia Preprocedure Evaluation (Signed)
Anesthesia Evaluation  Patient identified by MRN, date of birth, ID band Patient confused  General Assessment Comment:A&Ox2  Reviewed: Allergy & Precautions, H&P , NPO status , Patient's Chart, lab work & pertinent test results  Airway Mallampati: II  TM Distance: >3 FB Neck ROM: full    Dental  (+) Missing, Poor Dentition, Dental Advisory Given   Pulmonary neg shortness of breath, neg sleep apnea, neg recent URI, Current Smoker and Patient abstained from smoking.   Pulmonary exam normal        Cardiovascular Exercise Tolerance: Poor hypertension, Pt. on home beta blockers and Pt. 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  History of HIV dementia.   Hep C - s/p Harvoni for 12 weeks treatment  Patient reports balancing problem.  He uses wheelchair.  He lives in a group home  02/21/2022 MRI abdomen with and without contrast showed 1. Large, multiseptated cystic lesion occupying the majority of the pancreatic head and uncinate, measuring 7.6 x 5.7 x 4.6 cm. This may reflect a large, complex pseudocyst or cystic pancreatic neoplasm. No obvious significant solid component or contrast enhancement. Given large size recommend EUS/FNA for definitive diagnosis. 2. Severely atrophic pancreatic parenchyma with diffuse dilatation along the length of the pancreatic duct measuring up to 0.9 cm in   Past Medical History: No  date: Acute renal failure (HCC) 01/20/2012: Altered mental status No date: Diabetes mellitus without complication (HCC) No date: HIV (human immunodeficiency virus infection) (HCC) No date: Hypertension  Past Surgical History: No date: NO PAST SURGERIES     Reproductive/Obstetrics negative OB ROS                             Anesthesia Physical Anesthesia Plan  ASA: 3  Anesthesia Plan: General   Post-op Pain Management: Minimal or no pain anticipated   Induction: Intravenous  PONV Risk Score and Plan: 1 and Propofol infusion and TIVA  Airway Management Planned: Natural Airway and Nasal Cannula  Additional Equipment: None  Intra-op Plan:   Post-operative Plan:   Informed Consent: I have reviewed the patients History and Physical, chart, labs and discussed the procedure including the risks, benefits and alternatives for the proposed anesthesia with the patient or authorized representative who has indicated his/her understanding and acceptance.     Dental Advisory Given and Consent reviewed with POA  Plan Discussed with: CRNA and Surgeon  Anesthesia Plan Comments: (Discussed risks of anesthesia with patient, as well as the caregiver at bedside who provides consent (due to patient's dementia), including possibility of difficulty with spontaneous ventilation under anesthesia necessitating airway intervention, PONV, and rare risks such as cardiac or respiratory or neurological events, and allergic reactions. Discussed the role of CRNA in patient's perioperative care. Caregiver understands.)        Anesthesia Quick Evaluation

## 2023-02-26 NOTE — Discharge Instructions (Signed)
 Prostate Biopsy Discharge Instructions  No strenuous activity x 48 hours Blood from the rectum is normal and typically resolves within 12 hours Blood in the urine is normal and can persist intermittently for 2-3 weeks Blood in the semen is normal and can take 6-8 weeks to clear Contact our office at [phone] for excessive bleeding from the rectum or urine; inability to urinate or fever >101 degrees

## 2023-02-27 ENCOUNTER — Encounter: Payer: Self-pay | Admitting: Urology

## 2023-02-27 LAB — SURGICAL PATHOLOGY

## 2023-03-01 ENCOUNTER — Encounter: Payer: Self-pay | Admitting: Urology

## 2023-03-01 ENCOUNTER — Ambulatory Visit (INDEPENDENT_AMBULATORY_CARE_PROVIDER_SITE_OTHER): Payer: Medicaid Other | Admitting: Urology

## 2023-03-01 VITALS — BP 107/65 | HR 74 | Ht 73.0 in | Wt 136.0 lb

## 2023-03-01 DIAGNOSIS — C61 Malignant neoplasm of prostate: Secondary | ICD-10-CM | POA: Diagnosis not present

## 2023-03-01 NOTE — Progress Notes (Signed)
I, Maysun Anabel Bene, acting as a scribe for Riki Altes, MD., have documented all relevant documentation on the behalf of Riki Altes, MD, as directed by Riki Altes, MD while in the presence of Riki Altes, MD.  03/01/2023 10:37 AM   Jay Flores [birth year] [id number]  Referring provider: Mick Sell, MD [address] [place],  [place] [address]   HPI: Jay Flores is a 63 y.o. male presents for prostate biopsy follow-up.   Biopsy was performed under sedation 02/26/23. His prostate volume was 18.6 cc and the right peripheral zone was hypoechoic from base-apex. 12 core+ digitally directed biopsy was performed.   He has no post-biopsy complaints  Pathology: all right-sided cores were positive for Gleason 4+3 adenocarcinoma, with the exception of the right lateral apex which showed Gleason 3+4 adenocarcinoma. The 4 + 3 involved submitted tissue ranging from 60-80%   PMH: Past Medical History:  Diagnosis Date   Acute renal failure (HCC)    Altered mental status 01/20/2012   Anemia    Brain lesion    Cholelithiasis    Chronic indwelling Foley catheter    CKD (chronic kidney disease) stage 3, GFR 30-59 ml/min (HCC)    COPD (chronic obstructive pulmonary disease) (HCC)    Diabetic neuropathy (HCC)    DKA (diabetic ketoacidosis) (HCC)    DM (diabetes mellitus), type 2 (HCC)    Elevated PSA    Gait instability    uses walker   H/O urinary retention    Hepatitis C infection    s/p Harvoni   History of cocaine use    last used in 2012   HIV (human immunodeficiency virus infection) (HCC)    HIV dementia (HCC)    Hypertension    IPMN (intraductal papillary mucinous neoplasm)    Lives in group home    [facility]   Metabolic acidosis    Pancreatic cyst    Pneumonia    Thrombocytopenia (HCC)    Tobacco use     Surgical History: Past Surgical History:  Procedure Laterality Date   COLONOSCOPY WITH PROPOFOL N/A  09/05/2022   Procedure: COLONOSCOPY WITH PROPOFOL;  Surgeon: Toledo, Boykin Nearing, MD;  Location: [facility] ENDOSCOPY;  Service: Gastroenterology;  Laterality: N/A;   EUS N/A 04/19/2022   Procedure: UPPER ENDOSCOPIC ULTRASOUND (EUS) LINEAR;  Surgeon: Doren Custard, MD;  Location: [facility] ENDOSCOPY;  Service: Gastroenterology;  Laterality: N/A;  Requesting 1p slot   HEMOSTASIS CLIP PLACEMENT  09/05/2022   Procedure: HEMOSTASIS CLIP PLACEMENT;  Surgeon: Norma Fredrickson, Boykin Nearing, MD;  Location: [facility] ENDOSCOPY;  Service: Gastroenterology;;   POLYPECTOMY  09/05/2022   Procedure: POLYPECTOMY;  Surgeon: Norma Fredrickson, Boykin Nearing, MD;  Location: [facility] ENDOSCOPY;  Service: Gastroenterology;;   PROSTATE BIOPSY N/A 02/26/2023   Procedure: PROSTATE BIOPSY;  Surgeon: Riki Altes, MD;  Location: [facility] ORS;  Service: Urology;  Laterality: N/A;   TRANSRECTAL ULTRASOUND N/A 02/26/2023   Procedure: TRANSRECTAL ULTRASOUND;  Surgeon: Riki Altes, MD;  Location: [facility] ORS;  Service: Urology;  Laterality: N/A;    Home Medications:  Allergies as of 03/01/2023   No Known Allergies      Medication List        Accurate as of March 01, 2023 10:37 AM. If you have any questions, ask your nurse or doctor.          Accu-Chek Aviva Plus test strip Generic drug: glucose blood 1 each by Other route in the  morning, at noon, and at bedtime. Use as instructed   Accu-Chek Softclix Lancets lancets by Other route. Use as instructed   atorvastatin 20 MG tablet Commonly known as: LIPITOR Take 20 mg by mouth at bedtime.   cholecalciferol 25 MCG (1000 UNIT) tablet Commonly known as: VITAMIN D3 Take 1,000 Units by mouth at bedtime.   finasteride 5 MG tablet Commonly known as: PROSCAR Take 5 mg by mouth daily.   FreeStyle Libre 14 Day Reader Hardie Pulley Apply topically.   FreeStyle Libre 14 Day Sensor Misc Apply topically.   Jardiance 25 MG Tabs tablet Generic drug: empagliflozin Take 25 mg by mouth daily.   Juluca 50-25 MG  tablet Generic drug: dolutegravir-rilpivirine Take 1 tablet by mouth daily.   losartan 100 MG tablet Commonly known as: COZAAR Take 100 mg by mouth daily.   metoprolol succinate 100 MG 24 hr tablet Commonly known as: TOPROL-XL Take 100 mg by mouth daily.   sodium bicarbonate 650 MG tablet Take 650 mg by mouth 2 (two) times daily.   Tylenol 8 Hour Arthritis Pain 650 MG CR tablet Generic drug: acetaminophen Take 650 mg by mouth every 4 (four) hours as needed for pain.        Allergies: No Known Allergies  Family History: Family History  Problem Relation Age of Onset   Diabetes Mellitus II Mother     Social History:  reports that he has been smoking cigarettes. He has a 34 pack-year smoking history. He has been exposed to tobacco smoke. He has never used smokeless tobacco. He reports that he does not currently use drugs after having used the following drugs: Cocaine. He reports that he does not drink alcohol.   Physical Exam: BP 107/65   Pulse 74   Ht 6\' 1"  (1.854 m)   Wt 136 lb (61.7 kg)   BMI 17.94 kg/m   Constitutional:  Alert and oriented Psychiatric: Normal mood and affect.  Assessment & Plan:    1. Unfavorable intermediate risk prostate cancer Pathology report was discussed in detail and treatment is recommended. We discussed both radical prostatectomy and radiation modalities+ADT.  He is not interested in surgery and does have significant medical comorbidities.  Baseline bone scan ordered.  Referral to radiation oncology placed.   I have reviewed the above documentation for accuracy and completeness, and I agree with the above.   Riki Altes, MD  [facility] [address], [address] [place], [place] [address] [phone]

## 2023-03-06 ENCOUNTER — Encounter: Payer: Self-pay | Admitting: Radiation Oncology

## 2023-03-06 ENCOUNTER — Ambulatory Visit
Admission: RE | Admit: 2023-03-06 | Discharge: 2023-03-06 | Disposition: A | Discharge status: Home / Self Care | Payer: Medicaid Other | Source: Ambulatory Visit | Attending: Radiation Oncology | Admitting: Radiation Oncology

## 2023-03-06 VITALS — BP 118/74 | HR 90 | Temp 97.5°F | Resp 16

## 2023-03-06 DIAGNOSIS — Z79624 Long term (current) use of inhibitors of nucleotide synthesis: Secondary | ICD-10-CM | POA: Insufficient documentation

## 2023-03-06 DIAGNOSIS — E1122 Type 2 diabetes mellitus with diabetic chronic kidney disease: Secondary | ICD-10-CM | POA: Diagnosis not present

## 2023-03-06 DIAGNOSIS — C61 Malignant neoplasm of prostate: Secondary | ICD-10-CM | POA: Diagnosis present

## 2023-03-06 DIAGNOSIS — J449 Chronic obstructive pulmonary disease, unspecified: Secondary | ICD-10-CM | POA: Insufficient documentation

## 2023-03-06 DIAGNOSIS — F0390 Unspecified dementia without behavioral disturbance: Secondary | ICD-10-CM | POA: Diagnosis present

## 2023-03-06 DIAGNOSIS — E1140 Type 2 diabetes mellitus with diabetic neuropathy, unspecified: Secondary | ICD-10-CM | POA: Insufficient documentation

## 2023-03-06 DIAGNOSIS — Z79899 Other long term (current) drug therapy: Secondary | ICD-10-CM | POA: Insufficient documentation

## 2023-03-06 DIAGNOSIS — Z8619 Personal history of other infectious and parasitic diseases: Secondary | ICD-10-CM | POA: Insufficient documentation

## 2023-03-06 DIAGNOSIS — N183 Chronic kidney disease, stage 3 unspecified: Secondary | ICD-10-CM | POA: Diagnosis not present

## 2023-03-06 DIAGNOSIS — I129 Hypertensive chronic kidney disease with stage 1 through stage 4 chronic kidney disease, or unspecified chronic kidney disease: Secondary | ICD-10-CM | POA: Diagnosis not present

## 2023-03-06 DIAGNOSIS — F17210 Nicotine dependence, cigarettes, uncomplicated: Secondary | ICD-10-CM | POA: Insufficient documentation

## 2023-03-06 DIAGNOSIS — Z21 Asymptomatic human immunodeficiency virus [HIV] infection status: Secondary | ICD-10-CM | POA: Insufficient documentation

## 2023-03-06 DIAGNOSIS — F0280 Dementia in other diseases classified elsewhere without behavioral disturbance: Secondary | ICD-10-CM | POA: Insufficient documentation

## 2023-03-06 NOTE — Consult Note (Signed)
NEW PATIENT EVALUATION  Name: Jay Flores  MRN: [id number]  Date:   03/06/2023     DOB: 12-Mar-1960   This 63 y.o. 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   PREVIOUS INVESTIGATIONS:  MRI scan reviewed bone scan pending Pathology reports reviewed Clinical notes reviewed  HPI: Patient is a 62 year old male with history of acute renal failure, HIV dementia living in a group home renal retention with Foley catheter placed metabolic acidosis diabetic neuropathy COPD and CKD stage III who presents with elevated PSA in the 8 range.  MRI scan showed no transscapular spread although there was an area of 1.5 cm capsular contact.  No evidence of metastatic disease in bone or pelvic lymph nodes or seminal vesicle involvement.8 range he did have a PI-RADS category 5 lesion in the right peripheral zone and targeting data was sent to [place].  Pathology was positive for 5 of 12 cores positive for combination of Gleason 7 (3+4) and Gleason 7 (4+3).  Patient does have a Foley catheter placed.  He is in no pain.  He has a bone scan scheduled for tomorrow.  PLANNED TREATMENT REGIMEN: Image guided IMRT radiation therapy  PAST MEDICAL HISTORY:  has a past medical history of Acute renal failure (HCC), Altered mental status (01/20/2012), Anemia, Brain lesion, Cholelithiasis, Chronic indwelling Foley catheter, CKD (chronic kidney disease) stage 3, GFR 30-59 ml/min (HCC), COPD (chronic obstructive pulmonary disease) (HCC), Diabetic neuropathy (HCC), DKA (diabetic ketoacidosis) (HCC), DM (diabetes mellitus), type 2 (HCC), Elevated PSA, Gait instability, H/O urinary retention, Hepatitis C  infection, History of cocaine use, HIV (human immunodeficiency virus infection) (HCC), HIV dementia (HCC), Hypertension, IPMN (intraductal papillary mucinous neoplasm), Lives in group home, Metabolic acidosis, Pancreatic cyst, Pneumonia, Thrombocytopenia (HCC), and Tobacco use.    PAST SURGICAL HISTORY:  Past Surgical History:  Procedure Laterality Date   COLONOSCOPY WITH PROPOFOL N/A 09/05/2022   Procedure: COLONOSCOPY WITH PROPOFOL;  Surgeon: Toledo, Boykin Nearing, MD;  Location: [facility] ENDOSCOPY;  Service: Gastroenterology;  Laterality: N/A;   EUS N/A 04/19/2022   Procedure: UPPER ENDOSCOPIC ULTRASOUND (EUS) LINEAR;  Surgeon: Doren Custard, MD;  Location: [facility] ENDOSCOPY;  Service: Gastroenterology;  Laterality: N/A;  Requesting 1p slot   HEMOSTASIS CLIP PLACEMENT  09/05/2022   Procedure: HEMOSTASIS CLIP PLACEMENT;  Surgeon: Norma Fredrickson, Boykin Nearing, MD;  Location: [facility] ENDOSCOPY;  Service: Gastroenterology;;   POLYPECTOMY  09/05/2022   Procedure: POLYPECTOMY;  Surgeon: Norma Fredrickson, Boykin Nearing, MD;  Location: [facility] ENDOSCOPY;  Service: Gastroenterology;;   PROSTATE BIOPSY N/A 02/26/2023   Procedure: PROSTATE BIOPSY;  Surgeon: Riki Altes, MD;  Location: [facility] ORS;  Service: Urology;  Laterality: N/A;   TRANSRECTAL ULTRASOUND N/A 02/26/2023   Procedure: TRANSRECTAL ULTRASOUND;  Surgeon: Riki Altes, MD;  Location: [facility] ORS;  Service: Urology;  Laterality: N/A;    FAMILY HISTORY: family history includes Diabetes Mellitus II in his mother.  SOCIAL HISTORY:  reports that he has been smoking cigarettes. He has a 34 pack-year smoking history. He has been exposed to tobacco smoke. He has never used smokeless tobacco. He reports that he does not currently use drugs after having used the following drugs: Cocaine. He reports that he  does not drink alcohol.  ALLERGIES: Patient has no known allergies.  MEDICATIONS:  Current Outpatient Medications  Medication Sig Dispense Refill   Accu-Chek Softclix Lancets  lancets by Other route. Use as instructed     acetaminophen (TYLENOL 8 HOUR ARTHRITIS PAIN) 650 MG CR tablet Take 650 mg by mouth every 4 (four) hours as needed for pain.     atorvastatin (LIPITOR) 20 MG tablet Take 20 mg by mouth at bedtime.     cholecalciferol (VITAMIN D3) 25 MCG (1000 UNIT) tablet Take 1,000 Units by mouth at bedtime.     Continuous Blood Gluc Receiver (FREESTYLE LIBRE 14 DAY READER) DEVI Apply topically.     Continuous Blood Gluc Sensor (FREESTYLE LIBRE 14 DAY SENSOR) MISC Apply topically.     Dolutegravir-Rilpivirine (JULUCA) 50-25 MG TABS Take 1 tablet by mouth daily.     finasteride (PROSCAR) 5 MG tablet Take 5 mg by mouth daily.     glucose blood (ACCU-CHEK AVIVA PLUS) test strip 1 each by Other route in the morning, at noon, and at bedtime. Use as instructed     JARDIANCE 25 MG TABS tablet Take 25 mg by mouth daily.     losartan (COZAAR) 100 MG tablet Take 100 mg by mouth daily.     metoprolol succinate (TOPROL-XL) 100 MG 24 hr tablet Take 100 mg by mouth daily.     sodium bicarbonate 650 MG tablet Take 650 mg by mouth 2 (two) times daily.     No current facility-administered medications for this encounter.    ECOG PERFORMANCE STATUS:  0 - Asymptomatic  REVIEW OF SYSTEMS: Multiple comorbidities and including HIV dementia chronic renal disease COPD, acute renal failure altered mental status brain lesion chronic indwelling Foley diabetes mellitus gait instability hepatitis C infection Patient denies any weight loss, fatigue, weakness, fever, chills or night sweats. Patient denies any loss of vision, blurred vision. Patient denies any ringing  of the ears or hearing loss. No irregular heartbeat. Patient denies heart murmur or history of fainting. Patient denies any chest pain or pain radiating to her upper extremities. Patient denies any shortness of breath, difficulty breathing at night, cough or hemoptysis. Patient denies any swelling in the lower legs. Patient denies any  nausea vomiting, vomiting of blood, or coffee ground material in the vomitus. Patient denies any stomach pain. Patient states has had normal bowel movements no significant constipation or diarrhea. Patient denies any dysuria, hematuria or significant nocturia. Patient denies any problems walking, swelling in the joints or loss of balance. Patient denies any skin changes, loss of hair or loss of weight. Patient denies any excessive worrying or anxiety or significant depression. Patient denies any problems with insomnia. Patient denies excessive thirst, polyuria, polydipsia. Patient denies any swollen glands, patient denies easy bruising or easy bleeding. Patient denies any recent infections, allergies or URI. Patient "s visual fields have not changed significantly in recent time.   PHYSICAL EXAM: BP 118/74   Pulse 90   Temp (!) 97.5 F (36.4 C) (Temporal)   Resp 16  Wheelchair-bound male in NAD.  Does have indwelling Foley catheter.  Well-developed well-nourished patient in NAD. HEENT reveals PERLA, EOMI, discs not visualized.  Oral cavity is clear. No oral mucosal lesions are identified. Neck is clear without evidence of cervical or supraclavicular adenopathy. Lungs are clear to A&P. Cardiac examination is essentially unremarkable with regular rate and rhythm without murmur rub or thrill. Abdomen is benign with no organomegaly or masses noted. Motor sensory and DTR  levels are equal and symmetric in the upper and lower extremities. Cranial nerves II through XII are grossly intact. Proprioception is intact. No peripheral adenopathy or edema is identified. No motor or sensory levels are noted. Crude visual fields are within normal range.  LABORATORY DATA: Pathology reports reviewed    RADIOLOGY RESULTS: MRI scan reviewed bone scan pending   IMPRESSION: At least stage IIc Gleason 7 (4+3) adenocarcinoma the prostate in 63 year old male with multiple medical comorbidities presenting with a PSA in the 8  range  PLAN: At this time I have recommended image guided IMRT radiation therapy.  I will plan on delivering 80 Gray over 8 weeks.  I have asked Dr. Lonna Cobb to place fiducial markers in his prostate for daily image guided treatment.  Also would like him to start on 40-month Eligard depot.  We will then set him up for simulation.  Risks and benefits of treatment including increased lower urinary tract symptoms although he does have an indwelling Foley catheter possible diarrhea fatigue alteration blood counts all were described in detail to the patient.  He comprehends my recommendations well and is consented to treatment.  Simulation will be arranged after markers and Eligard have been administered.  I would like to take this opportunity to thank you for allowing me to participate in the care of your patient.Carmina Miller, MD

## 2023-03-07 ENCOUNTER — Encounter
Admission: RE | Admit: 2023-03-07 | Discharge: 2023-03-07 | Disposition: A | Discharge status: Home / Self Care | Payer: Medicaid Other | Source: Ambulatory Visit | Attending: Urology | Admitting: Urology

## 2023-03-07 DIAGNOSIS — C61 Malignant neoplasm of prostate: Secondary | ICD-10-CM | POA: Diagnosis present

## 2023-03-07 MED ORDER — TECHNETIUM TC 99M MEDRONATE IV KIT
20.0000 | PACK | Freq: Once | INTRAVENOUS | Status: AC | PRN
  Administered 2023-03-07: 20.97 via INTRAVENOUS

## 2023-03-13 ENCOUNTER — Ambulatory Visit: Payer: Medicaid Other | Admitting: Urology

## 2023-03-14 ENCOUNTER — Encounter: Payer: Self-pay | Admitting: Podiatry

## 2023-03-14 ENCOUNTER — Ambulatory Visit (INDEPENDENT_AMBULATORY_CARE_PROVIDER_SITE_OTHER): Payer: Medicaid Other | Admitting: Podiatry

## 2023-03-14 VITALS — Ht 73.0 in | Wt 136.0 lb

## 2023-03-14 DIAGNOSIS — M79675 Pain in left toe(s): Secondary | ICD-10-CM | POA: Diagnosis not present

## 2023-03-14 DIAGNOSIS — B351 Tinea unguium: Secondary | ICD-10-CM

## 2023-03-14 DIAGNOSIS — M79674 Pain in right toe(s): Secondary | ICD-10-CM

## 2023-03-14 DIAGNOSIS — L601 Onycholysis: Secondary | ICD-10-CM

## 2023-03-14 DIAGNOSIS — E1142 Type 2 diabetes mellitus with diabetic polyneuropathy: Secondary | ICD-10-CM | POA: Diagnosis not present

## 2023-03-14 NOTE — Patient Instructions (Signed)
 Apply Neosporin Cream to left great toe once daily for 7 days. Call office if there are any concerns.

## 2023-03-14 NOTE — Progress Notes (Signed)
 Subjective:  Patient ID: Jay Flores, male    DOB: 08/12/1960,  MRN: [id number]  63 y.o. male presents at risk foot care with history of diabetic neuropathy and painful porokeratotic lesion(s) of both feet and painful mycotic toenails that limit ambulation. Painful toenails interfere with ambulation. Aggravating factors include wearing enclosed shoe gear. Pain is relieved with periodic professional debridement. Painful porokeratotic lesions are aggravated when weightbearing with and without shoegear. Pain is relieved with periodic professional debridement.  Chief Complaint  Patient presents with   Nail Problem    Pt is here for [facility] unsure of last A1C PCP is Dr Quintella and LOV was 6 months ago.   New problem(s): None   PCP is [name], MD.  Patient has been diagnosed with prostate cancer and will be undergoing radiation.  No Known Allergies  Review of Systems: Negative except as noted in the HPI.   Objective:  Jay Flores is a pleasant 63 y.o. male in NAD. AAO x 3.  Vascular Examination: Capillary refill time <3 seconds b/l LE. Palpable pedal pulses b/l LE. Digital hair present b/l. No pedal edema b/l. Skin temperature gradient WNL b/l. No varicosities b/l. No cyanosis or clubbing noted b/l LE.SABRA  Dermatological Examination: Pedal skin with normal turgor, texture and tone b/l. No open wounds. No interdigital macerations b/l. Toenails right great toe and 2-5 b/l thickened, discolored, dystrophic with subungual debris. There is pain on palpation to dorsal aspect of nailplates.   There is noted onchyolysis of entire nailplate of L hallux.  The nailbed remains intact. There is no erythema, no edema, no drainage, no underlying fluctuance.  Porokeratotic lesion(s) submet head 5 b/l. No erythema, no edema, no drainage, no fluctuance.   Neurological Examination: Protective sensation diminished with 10g monofilament b/l.  Musculoskeletal Examination: Normal muscle  strength 5/5 to all lower extremity muscle groups bilaterally. Plantarflexed metatarsal(s) 5th metatarsal head b/l lower extremities.. No pain, crepitus or joint limitation noted with ROM b/l LE. Utilizes wheelchair for mobility assistance.  Radiographs: None  Last A1c:      Latest Ref Rng & Units 02/21/2023    1:12 PM  Hemoglobin A1C  Hemoglobin-A1c 4.8 - 5.6 % 6.3     Assessment:   1. Pain due to onychomycosis of toenails of both feet   2. Onycholysis of toenail   3. Diabetic polyneuropathy associated with type 2 diabetes mellitus (HCC)    Plan:  -Consent given for treatment as described below: -Examined patient. -Toenails were debrided in length and girth 2-5 bilaterally and right great toe with sterile nail nippers and dremel without iatrogenic bleeding.   -Loose nailplate left great toe gently debrided en toto. Digital nailbed cleansed with alcohol. Betadine Solution applied to nailbed followed by light dressing. Orders written for facility to apply Neosporin Cream to left great toe once daily for 7 days. -As a courtesy, porokeratotic lesion(s) submet head 5 b/l pared and enucleated without complication or incident. Total number pared=2. -Patient/POA to call should there be question/concern in the interim. -Patient will follow up after radiation treatment or sooner if he encounters any problems. -Patient will need total matrixectomy left great toe in the future for chronic deformed  mycotic toenail left great toe.  No follow-ups on file.  Delon LITTIE Merlin, DPM      [place] LOCATION: [address]  [place], [place] [address]                   Office [phone]   [facility] LOCATION: [address] [place], [place] [address] Office [phone]

## 2023-03-15 ENCOUNTER — Other Ambulatory Visit: Payer: Self-pay | Admitting: Podiatry

## 2023-03-15 MED ORDER — TRIPLE ANTIBIOTIC 3.5-400-5000 EX OINT
1.0000 | TOPICAL_OINTMENT | Freq: Every day | CUTANEOUS | 1 refills | Status: DC
Start: 2023-03-15 — End: 2023-09-28

## 2023-03-15 NOTE — Progress Notes (Signed)
 Jay Flores

## 2023-03-19 ENCOUNTER — Encounter: Payer: Self-pay | Admitting: Podiatry

## 2023-03-19 ENCOUNTER — Ambulatory Visit (INDEPENDENT_AMBULATORY_CARE_PROVIDER_SITE_OTHER): Payer: Medicaid Other | Admitting: Podiatry

## 2023-03-19 VITALS — Ht 73.0 in | Wt 136.0 lb

## 2023-03-19 DIAGNOSIS — L600 Ingrowing nail: Secondary | ICD-10-CM

## 2023-03-19 NOTE — Progress Notes (Signed)
Subjective: Jay Flores is a 63 y.o.  male returns to office today for follow up evaluation after having left 1st total nail avulsion performed. Patient has been soaking using epsom salt and applying topical antibiotic covered with bandaid daily. Patient denies fevers, chills, nausea, vomiting. Denies any calf pain, chest pain, SOB.   Objective:  Vitals: Reviewed  General: Well developed, nourished, in no acute distress, alert and oriented x3   Dermatology: Skin is warm, dry and supple bilateral. left 1st nail bed appears to be clean, dry, with mild granular tissue and surrounding scab. There is no surrounding erythema, edema, drainage/purulence. The remaining nails appear unremarkable at this time. There are no other lesions or other signs of infection present.  Neurovascular status: Intact. No lower extremity swelling; No pain with calf compression bilateral.  Musculoskeletal: Decreased tenderness to palpation of the left 1st nail bed. Muscular strength within normal limits bilateral.   Assesement and Plan: S/p total nail avulsion, doing well.   -Continue soaking in epsom salts twice a day followed by antibiotic ointment and a band-aid. Can leave uncovered at night. Continue this until completely healed.  -If the area has not healed in 2 weeks, call the office for follow-up appointment, or sooner if any problems arise.  -Monitor for any signs/symptoms of infection. Call the office immediately if any occur or go directly to the emergency room. Call with any questions/concerns.  Nicholes Rough, DPM

## 2023-03-27 ENCOUNTER — Other Ambulatory Visit: Payer: Medicaid Other | Admitting: Urology

## 2023-04-08 ENCOUNTER — Ambulatory Visit (INDEPENDENT_AMBULATORY_CARE_PROVIDER_SITE_OTHER): Payer: Medicaid Other | Admitting: Physician Assistant

## 2023-04-08 ENCOUNTER — Encounter: Payer: Self-pay | Admitting: Physician Assistant

## 2023-04-08 VITALS — BP 123/83 | HR 64 | Ht 73.0 in | Wt 136.0 lb

## 2023-04-08 DIAGNOSIS — C61 Malignant neoplasm of prostate: Secondary | ICD-10-CM

## 2023-04-08 DIAGNOSIS — R339 Retention of urine, unspecified: Secondary | ICD-10-CM

## 2023-04-08 DIAGNOSIS — Z466 Encounter for fitting and adjustment of urinary device: Secondary | ICD-10-CM

## 2023-04-08 MED ORDER — AMOXICILLIN 500 MG PO CAPS: 1000.0000 mg | ORAL_CAPSULE | Freq: Three times a day (TID) | ORAL | 0 refills | Status: AC

## 2023-04-08 NOTE — Progress Notes (Signed)
 Cath Change/ Replacement  Patient is present today for a catheter change due to urinary retention.  8ml of water was removed from the balloon, a 18FR coude foley cath was removed without difficulty.  Patient was cleaned and prepped in a sterile fashion with betadine and 2% lidocaine jelly was instilled into the urethra. A 18 FR coude foley cath was replaced into the bladder, no complications were noted. Urine return was noted 15ml and urine was yellow in color. The balloon was filled with 10ml of sterile water. A leg bag was attached for drainage.  Patient tolerated well.    Performed by: Carman Ching, PA-C   Additional notes: 2 tubes of Lidojet required for insertion today due to buckling at the prostate.  Will start amoxicillin 1g TID x7 days in advance of gold seed placement with Dr. Lonna Cobb later this week.  Follow up: Return in about 4 weeks (around 05/06/2023) for Catheter exchange.

## 2023-04-12 ENCOUNTER — Ambulatory Visit: Payer: Medicaid Other | Admitting: Urology

## 2023-04-12 VITALS — BP 121/71 | HR 69 | Ht 73.0 in | Wt 136.0 lb

## 2023-04-12 DIAGNOSIS — Z792 Long term (current) use of antibiotics: Secondary | ICD-10-CM

## 2023-04-12 DIAGNOSIS — Z2989 Encounter for other specified prophylactic measures: Secondary | ICD-10-CM | POA: Diagnosis not present

## 2023-04-12 DIAGNOSIS — C61 Malignant neoplasm of prostate: Secondary | ICD-10-CM

## 2023-04-12 MED ORDER — LEVOFLOXACIN 500 MG PO TABS
500.0000 mg | ORAL_TABLET | Freq: Once | ORAL | Status: AC
Start: 2023-04-12 — End: 2023-04-12
  Administered 2023-04-12: 500 mg via ORAL

## 2023-04-12 MED ORDER — GENTAMICIN SULFATE 40 MG/ML IJ SOLN
80.0000 mg | Freq: Once | INTRAMUSCULAR | Status: AC
  Administered 2023-04-12: 80 mg via INTRAMUSCULAR

## 2023-04-12 MED ORDER — LEUPROLIDE ACETATE (6 MONTH) 45 MG ~~LOC~~ KIT
45.0000 mg | PACK | Freq: Once | SUBCUTANEOUS | Status: AC
  Administered 2023-04-12: 45 mg via SUBCUTANEOUS

## 2023-04-12 NOTE — Progress Notes (Signed)
   04/12/23  CC: gold fiducial marker placement  HPI: 63 y.o. male with prostate cancer who presents today for placement of fiducial markers in anticipation of his upcoming IMRT with Dr. Rushie Chestnut.  Prostate Gold fiducial Marker Placement Procedure   Informed consent was obtained after discussing risks/benefits of the procedure.  A time out was performed to ensure correct patient identity.  Pre-Procedure: - Gentamicin given prophylactically - PO Levaquin 500 mg also given today  Procedure: - Rectal ultrasound probe was placed without difficulty and the prostate visualized - Prostatic block performed with 10 mL 1% Xylocaine - 3 fiducial gold seed markers placed, one at right base, one at left base, one at apex of prostate gland under transrectal ultrasound guidance  Post-Procedure: - Patient tolerated the procedure well - He was counseled to seek immediate medical attention if experiences any severe pain, significant bleeding, or fevers  ADT was recommended by radiation oncology and discussed the most common side effects of hot flashes, tiredness, fatigue, mineral bone loss, ED and decreased libido   Irineo Axon, MD

## 2023-04-15 ENCOUNTER — Ambulatory Visit
Admission: RE | Admit: 2023-04-15 | Discharge: 2023-04-15 | Disposition: A | Discharge status: Home / Self Care | Payer: Medicaid Other | Source: Ambulatory Visit | Attending: Radiation Oncology | Admitting: Radiation Oncology

## 2023-04-15 DIAGNOSIS — E1122 Type 2 diabetes mellitus with diabetic chronic kidney disease: Secondary | ICD-10-CM | POA: Diagnosis not present

## 2023-04-15 DIAGNOSIS — F0390 Unspecified dementia without behavioral disturbance: Secondary | ICD-10-CM | POA: Diagnosis present

## 2023-04-15 DIAGNOSIS — Z21 Asymptomatic human immunodeficiency virus [HIV] infection status: Secondary | ICD-10-CM | POA: Insufficient documentation

## 2023-04-15 DIAGNOSIS — Z8619 Personal history of other infectious and parasitic diseases: Secondary | ICD-10-CM | POA: Diagnosis not present

## 2023-04-15 DIAGNOSIS — Z79899 Other long term (current) drug therapy: Secondary | ICD-10-CM | POA: Diagnosis not present

## 2023-04-15 DIAGNOSIS — I129 Hypertensive chronic kidney disease with stage 1 through stage 4 chronic kidney disease, or unspecified chronic kidney disease: Secondary | ICD-10-CM | POA: Insufficient documentation

## 2023-04-15 DIAGNOSIS — C61 Malignant neoplasm of prostate: Secondary | ICD-10-CM | POA: Insufficient documentation

## 2023-04-15 DIAGNOSIS — F0280 Dementia in other diseases classified elsewhere without behavioral disturbance: Secondary | ICD-10-CM | POA: Diagnosis not present

## 2023-04-15 DIAGNOSIS — N183 Chronic kidney disease, stage 3 unspecified: Secondary | ICD-10-CM | POA: Insufficient documentation

## 2023-04-15 DIAGNOSIS — J449 Chronic obstructive pulmonary disease, unspecified: Secondary | ICD-10-CM | POA: Diagnosis not present

## 2023-04-15 DIAGNOSIS — F17210 Nicotine dependence, cigarettes, uncomplicated: Secondary | ICD-10-CM | POA: Diagnosis not present

## 2023-04-15 DIAGNOSIS — Z79624 Long term (current) use of inhibitors of nucleotide synthesis: Secondary | ICD-10-CM | POA: Insufficient documentation

## 2023-04-15 DIAGNOSIS — E1140 Type 2 diabetes mellitus with diabetic neuropathy, unspecified: Secondary | ICD-10-CM | POA: Insufficient documentation

## 2023-04-18 ENCOUNTER — Telehealth: Payer: Self-pay

## 2023-04-18 NOTE — Telephone Encounter (Signed)
 Brandi from [facility] called stating that patient has noticed some blood in his cath bag since yesterday, dark red color. He did have gold markers placed on 04/12/23. He is not having any other issues and urine is draining. I spoke with Sam who advised it is normal at this time and to keep [place] updated and to be on the look out for fever, abdominal swelling, blood clots, urine not draining well, etc. Brandi advised.

## 2023-04-19 ENCOUNTER — Other Ambulatory Visit: Payer: Self-pay | Admitting: *Deleted

## 2023-04-19 DIAGNOSIS — C61 Malignant neoplasm of prostate: Secondary | ICD-10-CM

## 2023-04-24 ENCOUNTER — Observation Stay
Admission: EM | Admit: 2023-04-24 | Discharge: 2023-04-26 | Disposition: A | Discharge status: Nursing Home (Includes ICF) | Attending: Internal Medicine | Admitting: Internal Medicine

## 2023-04-24 ENCOUNTER — Ambulatory Visit
Admission: RE | Admit: 2023-04-24 | Discharge: 2023-04-24 | Disposition: A | Discharge status: Home / Self Care | Source: Ambulatory Visit | Attending: Radiation Oncology | Admitting: Radiation Oncology

## 2023-04-24 ENCOUNTER — Emergency Department

## 2023-04-24 DIAGNOSIS — D696 Thrombocytopenia, unspecified: Secondary | ICD-10-CM | POA: Diagnosis present

## 2023-04-24 DIAGNOSIS — F17210 Nicotine dependence, cigarettes, uncomplicated: Secondary | ICD-10-CM | POA: Insufficient documentation

## 2023-04-24 DIAGNOSIS — E785 Hyperlipidemia, unspecified: Secondary | ICD-10-CM | POA: Diagnosis present

## 2023-04-24 DIAGNOSIS — N179 Acute kidney failure, unspecified: Secondary | ICD-10-CM | POA: Diagnosis not present

## 2023-04-24 DIAGNOSIS — R55 Syncope and collapse: Secondary | ICD-10-CM | POA: Diagnosis present

## 2023-04-24 DIAGNOSIS — Z79899 Other long term (current) drug therapy: Secondary | ICD-10-CM | POA: Insufficient documentation

## 2023-04-24 DIAGNOSIS — J449 Chronic obstructive pulmonary disease, unspecified: Secondary | ICD-10-CM | POA: Diagnosis not present

## 2023-04-24 DIAGNOSIS — N1832 Chronic kidney disease, stage 3b: Secondary | ICD-10-CM | POA: Insufficient documentation

## 2023-04-24 DIAGNOSIS — Z21 Asymptomatic human immunodeficiency virus [HIV] infection status: Secondary | ICD-10-CM | POA: Diagnosis present

## 2023-04-24 DIAGNOSIS — Z720 Tobacco use: Secondary | ICD-10-CM | POA: Diagnosis present

## 2023-04-24 DIAGNOSIS — B20 Human immunodeficiency virus [HIV] disease: Secondary | ICD-10-CM | POA: Insufficient documentation

## 2023-04-24 DIAGNOSIS — Z7984 Long term (current) use of oral hypoglycemic drugs: Secondary | ICD-10-CM | POA: Insufficient documentation

## 2023-04-24 DIAGNOSIS — R7402 Elevation of levels of lactic acid dehydrogenase (LDH): Secondary | ICD-10-CM | POA: Insufficient documentation

## 2023-04-24 DIAGNOSIS — E1122 Type 2 diabetes mellitus with diabetic chronic kidney disease: Secondary | ICD-10-CM | POA: Diagnosis not present

## 2023-04-24 DIAGNOSIS — I129 Hypertensive chronic kidney disease with stage 1 through stage 4 chronic kidney disease, or unspecified chronic kidney disease: Secondary | ICD-10-CM | POA: Insufficient documentation

## 2023-04-24 DIAGNOSIS — D649 Anemia, unspecified: Secondary | ICD-10-CM | POA: Diagnosis not present

## 2023-04-24 DIAGNOSIS — D631 Anemia in chronic kidney disease: Secondary | ICD-10-CM | POA: Diagnosis present

## 2023-04-24 DIAGNOSIS — E1129 Type 2 diabetes mellitus with other diabetic kidney complication: Secondary | ICD-10-CM | POA: Diagnosis present

## 2023-04-24 DIAGNOSIS — C61 Malignant neoplasm of prostate: Secondary | ICD-10-CM | POA: Diagnosis not present

## 2023-04-24 DIAGNOSIS — I10 Essential (primary) hypertension: Secondary | ICD-10-CM | POA: Diagnosis present

## 2023-04-24 DIAGNOSIS — R7989 Other specified abnormal findings of blood chemistry: Secondary | ICD-10-CM | POA: Diagnosis present

## 2023-04-24 LAB — CBC WITH DIFFERENTIAL/PLATELET
Abs Immature Granulocytes: 0.1 10*3/uL — ABNORMAL HIGH (ref 0.00–0.07)
Basophils Absolute: 0 10*3/uL (ref 0.0–0.1)
Basophils Relative: 0 %
Eosinophils Absolute: 0.1 10*3/uL (ref 0.0–0.5)
Eosinophils Relative: 1 %
HCT: 18.1 % — ABNORMAL LOW (ref 39.0–52.0)
Hemoglobin: 5.7 g/dL — ABNORMAL LOW (ref 13.0–17.0)
Immature Granulocytes: 1 %
Lymphocytes Relative: 22 %
Lymphs Abs: 1.8 10*3/uL (ref 0.7–4.0)
MCH: 33.1 pg (ref 26.0–34.0)
MCHC: 31.5 g/dL (ref 30.0–36.0)
MCV: 105.2 fL — ABNORMAL HIGH (ref 80.0–100.0)
Monocytes Absolute: 0.4 10*3/uL (ref 0.1–1.0)
Monocytes Relative: 5 %
Neutro Abs: 5.6 10*3/uL (ref 1.7–7.7)
Neutrophils Relative %: 71 %
Platelets: 143 10*3/uL — ABNORMAL LOW (ref 150–400)
RBC: 1.72 MIL/uL — ABNORMAL LOW (ref 4.22–5.81)
RDW: 14.1 % (ref 11.5–15.5)
WBC: 8 10*3/uL (ref 4.0–10.5)
nRBC: 0 % (ref 0.0–0.2)

## 2023-04-24 LAB — COMPREHENSIVE METABOLIC PANEL
ALT: 10 U/L (ref 0–44)
AST: 16 U/L (ref 15–41)
Albumin: 2.5 g/dL — ABNORMAL LOW (ref 3.5–5.0)
Alkaline Phosphatase: 52 U/L (ref 38–126)
Anion gap: 7 (ref 5–15)
BUN: 31 mg/dL — ABNORMAL HIGH (ref 8–23)
CO2: 22 mmol/L (ref 22–32)
Calcium: 7.7 mg/dL — ABNORMAL LOW (ref 8.9–10.3)
Chloride: 112 mmol/L — ABNORMAL HIGH (ref 98–111)
Creatinine, Ser: 3.52 mg/dL — ABNORMAL HIGH (ref 0.61–1.24)
GFR, Estimated: 19 mL/min — ABNORMAL LOW (ref >60)
Glucose, Bld: 191 mg/dL — ABNORMAL HIGH (ref 70–99)
Potassium: 4.3 mmol/L (ref 3.5–5.1)
Sodium: 141 mmol/L (ref 135–145)
Total Bilirubin: 0.3 mg/dL (ref 0.0–1.2)
Total Protein: 5.4 g/dL — ABNORMAL LOW (ref 6.5–8.1)

## 2023-04-24 LAB — CBG MONITORING, ED: Glucose-Capillary: 164 mg/dL — ABNORMAL HIGH (ref 70–99)

## 2023-04-24 LAB — PROCALCITONIN: Procalcitonin: <0.1 ng/mL

## 2023-04-24 LAB — PREPARE RBC (CROSSMATCH)

## 2023-04-24 LAB — TROPONIN I (HIGH SENSITIVITY): Troponin I (High Sensitivity): 9 ng/L (ref <18)

## 2023-04-24 LAB — ABO/RH: ABO/RH(D): A POS

## 2023-04-24 LAB — LACTIC ACID, PLASMA: Lactic Acid, Venous: 2.4 mmol/L (ref 0.5–1.9)

## 2023-04-24 LAB — APTT: aPTT: 27 s (ref 24–36)

## 2023-04-24 LAB — PROTIME-INR
INR: 1.2 (ref 0.8–1.2)
Prothrombin Time: 15.6 s — ABNORMAL HIGH (ref 11.4–15.2)

## 2023-04-24 MED ORDER — HYDRALAZINE HCL 20 MG/ML IJ SOLN
5.0000 mg | INTRAMUSCULAR | Status: DC | PRN
  Filled 2023-04-24: qty 1

## 2023-04-24 MED ORDER — INSULIN ASPART 100 UNIT/ML IJ SOLN: 0.0000 [IU] | Freq: Every day | INTRAMUSCULAR | Status: DC

## 2023-04-24 MED ORDER — DM-GUAIFENESIN ER 30-600 MG PO TB12: 1.0000 | ORAL_TABLET | Freq: Two times a day (BID) | ORAL | Status: DC | PRN

## 2023-04-24 MED ORDER — ALBUTEROL SULFATE (2.5 MG/3ML) 0.083% IN NEBU: 2.5000 mg | INHALATION_SOLUTION | RESPIRATORY_TRACT | Status: DC | PRN

## 2023-04-24 MED ORDER — INSULIN ASPART 100 UNIT/ML IJ SOLN
0.0000 [IU] | Freq: Three times a day (TID) | INTRAMUSCULAR | Status: DC
  Administered 2023-04-25: 2 [IU] via SUBCUTANEOUS
  Administered 2023-04-25 (×2): 1 [IU] via SUBCUTANEOUS
  Filled 2023-04-24 (×3): qty 1

## 2023-04-24 MED ORDER — ALBUTEROL SULFATE HFA 108 (90 BASE) MCG/ACT IN AERS: 2.0000 | INHALATION_SPRAY | RESPIRATORY_TRACT | Status: DC | PRN

## 2023-04-24 MED ORDER — LACTATED RINGERS IV BOLUS
1000.0000 mL | Freq: Once | INTRAVENOUS | Status: AC
  Administered 2023-04-24: 1000 mL via INTRAVENOUS

## 2023-04-24 MED ORDER — SODIUM CHLORIDE 0.9 % IV SOLN
10.0000 mL/h | Freq: Once | INTRAVENOUS | Status: AC
  Administered 2023-04-25: 10 mL/h via INTRAVENOUS

## 2023-04-24 MED ORDER — ONDANSETRON HCL 4 MG/2ML IJ SOLN: 4.0000 mg | Freq: Three times a day (TID) | INTRAMUSCULAR | Status: DC | PRN

## 2023-04-24 MED ORDER — ACETAMINOPHEN 325 MG PO TABS: 650.0000 mg | ORAL_TABLET | Freq: Four times a day (QID) | ORAL | Status: DC | PRN

## 2023-04-24 MED ORDER — NICOTINE 21 MG/24HR TD PT24
21.0000 mg | MEDICATED_PATCH | Freq: Every day | TRANSDERMAL | Status: DC
  Administered 2023-04-25 – 2023-04-26 (×2): 21 mg via TRANSDERMAL
  Filled 2023-04-24 (×2): qty 1

## 2023-04-24 NOTE — ED Triage Notes (Signed)
 Pt arrived via ems from [facility]. Pt was found to have and apneic episode and low BP. Pt stated he did smoke "one blunt of marijuana before dinner." Pt has prostate cancer and received radiation today.

## 2023-04-24 NOTE — ED Provider Notes (Signed)
 [facility] Provider Note    Event Date/Time   First MD Initiated Contact with Patient 04/24/23 1952     (approximate)   History   Loss of Consciousness   HPI  Jay Flores is a 63 y.o. male who presents to the emergency department today because of concerns for syncopal episode.  Patient is coming from a care group home.  Apparently the patient was found to have low blood pressure when this occurred.  Patient himself is unable to recall what happened.  He states that he has been in his normal state of health.  He denies any recent illness, fevers.  He denies any chest pain or shortness of breath today.     Physical Exam   Triage Vital Signs: ED Triage Vitals  Encounter Vitals Group     BP 04/24/23 2000 (!) 97/51     Systolic BP Percentile --      Diastolic BP Percentile --      Pulse Rate 04/24/23 2000 63     Resp 04/24/23 2000 20     Temp 04/24/23 2000 97.8 F (36.6 C)     Temp Source 04/24/23 2000 Oral     SpO2 04/24/23 2000 100 %     Weight --      Height --      Head Circumference --      Peak Flow --      Pain Score 04/24/23 2001 0     Pain Loc --      Pain Education --      Exclude from Growth Chart --     Most recent vital signs: Vitals:   04/24/23 2000  BP: (!) 97/51  Pulse: 63  Resp: 20  Temp: 97.8 F (36.6 C)  SpO2: 100%   General: Awake, alert, not oriented to events. CV:  Good peripheral perfusion. Regular rate and rhythm. Resp:  Normal effort. Lungs clear. Abd:  No distention. Non tender. Other:  GUIAC negative. Brown stool on glove.   ED Results / Procedures / Treatments   Labs (all labs ordered are listed, but only abnormal results are displayed) Labs Reviewed  LACTIC ACID, PLASMA - Abnormal; Notable for the following components:      Result Value   Lactic Acid, Venous 2.4 (*)    All other components within normal limits  COMPREHENSIVE METABOLIC PANEL - Abnormal; Notable for the following components:    Chloride 112 (*)    Glucose, Bld 191 (*)    BUN 31 (*)    Creatinine, Ser 3.52 (*)    Calcium 7.7 (*)    Total Protein 5.4 (*)    Albumin 2.5 (*)    GFR, Estimated 19 (*)    All other components within normal limits  CBC WITH DIFFERENTIAL/PLATELET - Abnormal; Notable for the following components:   RBC 1.72 (*)    Hemoglobin 5.7 (*)    HCT 18.1 (*)    MCV 105.2 (*)    Platelets 143 (*)    Abs Immature Granulocytes 0.10 (*)    All other components within normal limits  CULTURE, BLOOD (ROUTINE X 2)  CULTURE, BLOOD (ROUTINE X 2)  LACTIC ACID, PLASMA  URINALYSIS, W/ REFLEX TO CULTURE (INFECTION SUSPECTED)  PREPARE RBC (CROSSMATCH)  ABO/RH  TYPE AND SCREEN  TROPONIN I (HIGH SENSITIVITY)     RADIOLOGY I independently interpreted and visualized the CXR. My interpretation: No pneumonia Radiology interpretation:  IMPRESSION:  No active disease.  PROCEDURES:  Critical Care performed: Yes  CRITICAL CARE Performed by: Phineas Semen   Total critical care time: 30 minutes  Critical care time was exclusive of separately billable procedures and treating other patients.  Critical care was necessary to treat or prevent imminent or life-threatening deterioration.  Critical care was time spent personally by me on the following activities: development of treatment plan with patient and/or surrogate as well as nursing, discussions with consultants, evaluation of patient's response to treatment, examination of patient, obtaining history from patient or surrogate, ordering and performing treatments and interventions, ordering and review of laboratory studies, ordering and review of radiographic studies, pulse oximetry and re-evaluation of patient's condition.   Procedures    MEDICATIONS ORDERED IN ED: Medications - No data to display   IMPRESSION / MDM / ASSESSMENT AND PLAN / ED COURSE  I reviewed the triage vital signs and the nursing notes.                               Differential diagnosis includes, but is not limited to, dehydration, arrythmia, vaso vagal, anemia, electrolyte abnormality, ACS  Patient's presentation is most consistent with acute presentation with potential threat to life or bodily function.   The patient is on the cardiac monitor to evaluate for evidence of arrhythmia and/or significant heart rate changes.  Patient presented to the emergency department today after a syncopal episode.  On exam patient is awake and alert although not oriented to events.  Patient was found to be hypotensive.  Blood work was notable for significant anemia.  Patient guaiac was negative.  Did discuss this with the patient and consented patient for blood transfusion.  Also found to have slight AKI as well as slight lactic acidosis.  At this time I do think it is likely secondary to poor perfusion and less likely secondary to infection.  Will add on procalcitonin. Discussed with Dr. Clyde Lundborg with the hospitalist service who will evaluate for admission.    FINAL CLINICAL IMPRESSION(S) / ED DIAGNOSES   Final diagnoses:  Anemia, unspecified type  Syncope, unspecified syncope type  AKI (acute kidney injury) (HCC)         Note:  This document was prepared using Dragon voice recognition software and may include unintentional dictation errors.    Phineas Semen, MD 04/24/23 [id number]

## 2023-04-24 NOTE — H&P (Signed)
 History and Physical    GILMORE LIST NWG:[id number] DOB: 13-Mar-1960 DOA: 04/24/2023  Referring MD/NP/PA:   PCP: Mick Sell, MD   Patient coming from:  The patient is coming from home.     Chief Complaint:   HPI: Jay Flores is a 63 y.o. male with medical history significant of      Data reviewed independently and ED Course: pt was found to have     ***       EKG: I have personally reviewed.  Not done in ED, will get one.   ***   Review of Systems:   General: no fevers, chills, no body weight gain, has poor appetite, has fatigue HEENT: no blurry vision, hearing changes or sore throat Respiratory: no dyspnea, coughing, wheezing CV: no chest pain, no palpitations GI: no nausea, vomiting, abdominal pain, diarrhea, constipation GU: no dysuria, burning on urination, increased urinary frequency, hematuria  Ext: no leg edema Neuro: no unilateral weakness, numbness, or tingling, no vision change or hearing loss Skin: no rash, no skin tear. MSK: No muscle spasm, no deformity, no limitation of range of movement in spin Heme: No easy bruising.  Travel history: No recent long distant travel.   Allergy: No Known Allergies  Past Medical History:  Diagnosis Date   Acute renal failure (HCC)    Altered mental status 01/20/2012   Anemia    Brain lesion    Cholelithiasis    Chronic indwelling Foley catheter    CKD (chronic kidney disease) stage 3, GFR 30-59 ml/min (HCC)    COPD (chronic obstructive pulmonary disease) (HCC)    Diabetic neuropathy (HCC)    DKA (diabetic ketoacidosis) (HCC)    DM (diabetes mellitus), type 2 (HCC)    Elevated PSA    Gait instability    uses walker   H/O urinary retention    Hepatitis C infection    s/p Harvoni   History of cocaine use    last used in 2012   HIV (human immunodeficiency virus infection) (HCC)    HIV dementia (HCC)    Hypertension    IPMN (intraductal papillary mucinous neoplasm)    Lives in group home     [facility]   Metabolic acidosis    Pancreatic cyst    Pneumonia    Thrombocytopenia (HCC)    Tobacco use     Past Surgical History:  Procedure Laterality Date   COLONOSCOPY WITH PROPOFOL N/A 09/05/2022   Procedure: COLONOSCOPY WITH PROPOFOL;  Surgeon: Toledo, Boykin Nearing, MD;  Location: [facility] ENDOSCOPY;  Service: Gastroenterology;  Laterality: N/A;   EUS N/A 04/19/2022   Procedure: UPPER ENDOSCOPIC ULTRASOUND (EUS) LINEAR;  Surgeon: Doren Custard, MD;  Location: [facility] ENDOSCOPY;  Service: Gastroenterology;  Laterality: N/A;  Requesting 1p slot   HEMOSTASIS CLIP PLACEMENT  09/05/2022   Procedure: HEMOSTASIS CLIP PLACEMENT;  Surgeon: Norma Fredrickson, Boykin Nearing, MD;  Location: [facility] ENDOSCOPY;  Service: Gastroenterology;;   POLYPECTOMY  09/05/2022   Procedure: POLYPECTOMY;  Surgeon: Norma Fredrickson, Boykin Nearing, MD;  Location: [facility] ENDOSCOPY;  Service: Gastroenterology;;   PROSTATE BIOPSY N/A 02/26/2023   Procedure: PROSTATE BIOPSY;  Surgeon: Riki Altes, MD;  Location: [facility] ORS;  Service: Urology;  Laterality: N/A;   TRANSRECTAL ULTRASOUND N/A 02/26/2023   Procedure: TRANSRECTAL ULTRASOUND;  Surgeon: Riki Altes, MD;  Location: [facility] ORS;  Service: Urology;  Laterality: N/A;    Social History:  reports that he has been smoking cigarettes. He has a 34 pack-year  smoking history. He has been exposed to tobacco smoke. He has never used smokeless tobacco. He reports that he does not currently use drugs after having used the following drugs: Cocaine. He reports that he does not drink alcohol.  Family History:  Family History  Problem Relation Age of Onset   Diabetes Mellitus II Mother      Prior to Admission medications   Medication Sig Start Date End Date Taking? Authorizing Provider  Accu-Chek Softclix Lancets lancets by Other route. Use as instructed    [provider]  acetaminophen (TYLENOL 8 HOUR ARTHRITIS PAIN) 650 MG CR tablet Take 650 mg by mouth every 4  (four) hours as needed for pain.    [provider]  atorvastatin (LIPITOR) 20 MG tablet Take 20 mg by mouth at bedtime.    [provider]  cholecalciferol (VITAMIN D3) 25 MCG (1000 UNIT) tablet Take 1,000 Units by mouth at bedtime.    [provider]  Continuous Blood Gluc Receiver (FREESTYLE LIBRE 14 DAY READER) DEVI Apply topically. 10/31/21   [provider]  Continuous Blood Gluc Sensor (FREESTYLE LIBRE 14 DAY SENSOR) MISC Apply topically. 01/17/22   [provider]  Dolutegravir-Rilpivirine (JULUCA) 50-25 MG TABS Take 1 tablet by mouth daily.    [provider]  finasteride (PROSCAR) 5 MG tablet Take 5 mg by mouth daily.    [provider]  glucose blood (ACCU-CHEK AVIVA PLUS) test strip 1 each by Other route in the morning, at noon, and at bedtime. Use as instructed    [provider]  JARDIANCE 25 MG TABS tablet Take 25 mg by mouth daily. 01/14/23   [provider]  losartan (COZAAR) 100 MG tablet Take 100 mg by mouth daily.    [provider]  metoprolol succinate (TOPROL-XL) 100 MG 24 hr tablet Take 100 mg by mouth daily. 09/03/20   [provider]  neomycin-bacitracin-polymyxin [phone] OINT Apply 1 Application topically daily. 03/15/23   Felecia Shelling, DPM  sodium bicarbonate 650 MG tablet Take 650 mg by mouth 2 (two) times daily.    [provider]    Physical Exam: Vitals:   04/24/23 2000  BP: (!) 97/51  Pulse: 63  Resp: 20  Temp: 97.8 F (36.6 C)  TempSrc: Oral  SpO2: 100%   General: Not in acute distress HEENT:       Eyes: PERRL, EOMI, no jaundice       ENT: No discharge from the ears and nose, no pharynx injection, no tonsillar enlargement.        Neck: No JVD, no bruit, no mass felt. Heme: No neck lymph node enlargement. Cardiac: S1/S2, RRR, No murmurs, No gallops or rubs. Respiratory: No rales, wheezing, rhonchi or rubs. GI: Soft, nondistended, nontender,  no rebound pain, no organomegaly, BS present. GU: No hematuria Ext: No pitting leg edema bilaterally. 1+DP/PT pulse bilaterally. Musculoskeletal: No joint deformities, No joint redness or warmth, no limitation of ROM in spin. Skin: No rashes.  Neuro: Alert, oriented X3, cranial nerves II-XII grossly intact, moves all extremities normally. Muscle strength 5/5 in all extremities, sensation to light touch intact. Brachial reflex 2+ bilaterally. Knee reflex 1+ bilaterally. Negative Babinski's sign. Normal finger to nose test. Psych: Patient is not psychotic, no suicidal or hemocidal ideation.  Labs on Admission: I have personally reviewed following labs and imaging studies  CBC: Recent Labs  Lab 04/24/23 2011  WBC 8.0  NEUTROABS 5.6  HGB 5.7*  HCT 18.1*  MCV  105.2*  PLT 143*   Basic Metabolic Panel: Recent Labs  Lab 04/24/23 2011  NA 141  K 4.3  CL 112*  CO2 22  GLUCOSE 191*  BUN 31*  CREATININE 3.52*  CALCIUM 7.7*   GFR: Estimated Creatinine Clearance: 19.2 mL/min (A) (by C-G formula based on SCr of 3.52 mg/dL (H)). Liver Function Tests: Recent Labs  Lab 04/24/23 2011  AST 16  ALT 10  ALKPHOS 52  BILITOT 0.3  PROT 5.4*  ALBUMIN 2.5*   No results for input(s): "LIPASE", "AMYLASE" in the last 168 hours. No results for input(s): "AMMONIA" in the last 168 hours. Coagulation Profile: No results for input(s): "INR", "PROTIME" in the last 168 hours. Cardiac Enzymes: No results for input(s): "CKTOTAL", "CKMB", "CKMBINDEX", "TROPONINI" in the last 168 hours. BNP (last 3 results) No results for input(s): "PROBNP" in the last 8760 hours. HbA1C: No results for input(s): "HGBA1C" in the last 72 hours. CBG: No results for input(s): "GLUCAP" in the last 168 hours. Lipid Profile: No results for input(s): "CHOL", "HDL", "LDLCALC", "TRIG", "CHOLHDL", "LDLDIRECT" in the last 72 hours. Thyroid Function Tests: No results for input(s): "TSH", "T4TOTAL", "FREET4", "T3FREE",  "THYROIDAB" in the last 72 hours. Anemia Panel: No results for input(s): "VITAMINB12", "FOLATE", "FERRITIN", "TIBC", "IRON", "RETICCTPCT" in the last 72 hours. Urine analysis:    Component Value Date/Time   COLORURINE YELLOW 05/25/2017 1840   APPEARANCEUR Cloudy (A) 02/11/2023 1123   LABSPEC 1.010 05/25/2017 1840   PHURINE 5.0 05/25/2017 1840   GLUCOSEU 3+ (A) 02/11/2023 1123   HGBUR SMALL (A) 05/25/2017 1840   BILIRUBINUR Negative 02/11/2023 1123   KETONESUR NEGATIVE 05/25/2017 1840   PROTEINUR 3+ (A) 02/11/2023 1123   PROTEINUR >=300 (A) 05/25/2017 1840   UROBILINOGEN 1.0 01/23/2012 0602   NITRITE Positive (A) 02/11/2023 1123   NITRITE NEGATIVE 05/25/2017 1840   LEUKOCYTESUR 1+ (A) 02/11/2023 1123   Sepsis Labs: @LABRCNTIP (procalcitonin:4,lacticidven:4) )No results found for this or any previous visit (from the past 240 hours).   Radiological Exams on Admission:   Assessment/Plan Active Problems:   * No active hospital problems. *   Assessment and Plan: No notes have been filed under this hospital service. Service: Hospitalist      Active Problems:   * No active hospital problems. *    DVT ppx: SQ Heparin         SQ Lovenox  Code Status: Full code   ***  Family Communication:     not done, no family member is at bed side.              Yes, patient's    at bed side.       by phone   ***  Disposition Plan:  Anticipate discharge back to previous environment  Consults called:    Admission status and Level of care: :    for obs as inpt        Dispo: The patient is from: {From:23814}              Anticipated d/c is to: {To:23815}              Anticipated d/c date is: {Days:23816}              Patient currently {Medically stable:23817}    Severity of Illness:  {Observation/Inpatient:21159}       Date of Service 04/24/2023    Lorretta Harp [facility]   If 7PM-7AM, please contact night-coverage [email or web address] 04/24/2023, 10:01 PM

## 2023-04-25 ENCOUNTER — Ambulatory Visit

## 2023-04-25 ENCOUNTER — Other Ambulatory Visit: Payer: Self-pay

## 2023-04-25 ENCOUNTER — Encounter: Payer: Self-pay | Admitting: Internal Medicine

## 2023-04-25 DIAGNOSIS — N1831 Chronic kidney disease, stage 3a: Secondary | ICD-10-CM

## 2023-04-25 DIAGNOSIS — E1122 Type 2 diabetes mellitus with diabetic chronic kidney disease: Secondary | ICD-10-CM | POA: Diagnosis not present

## 2023-04-25 DIAGNOSIS — R55 Syncope and collapse: Secondary | ICD-10-CM | POA: Diagnosis not present

## 2023-04-25 DIAGNOSIS — N179 Acute kidney failure, unspecified: Secondary | ICD-10-CM | POA: Diagnosis not present

## 2023-04-25 DIAGNOSIS — D649 Anemia, unspecified: Secondary | ICD-10-CM | POA: Diagnosis not present

## 2023-04-25 LAB — CBC WITH DIFFERENTIAL/PLATELET
Abs Immature Granulocytes: 0.07 10*3/uL (ref 0.00–0.07)
Basophils Absolute: 0 10*3/uL (ref 0.0–0.1)
Basophils Relative: 0 %
Eosinophils Absolute: 0 10*3/uL (ref 0.0–0.5)
Eosinophils Relative: 0 %
HCT: 26.1 % — ABNORMAL LOW (ref 39.0–52.0)
Hemoglobin: 8.6 g/dL — ABNORMAL LOW (ref 13.0–17.0)
Immature Granulocytes: 1 %
Lymphocytes Relative: 19 %
Lymphs Abs: 1.7 10*3/uL (ref 0.7–4.0)
MCH: 32.2 pg (ref 26.0–34.0)
MCHC: 33 g/dL (ref 30.0–36.0)
MCV: 97.8 fL (ref 80.0–100.0)
Monocytes Absolute: 0.5 10*3/uL (ref 0.1–1.0)
Monocytes Relative: 5 %
Neutro Abs: 6.7 10*3/uL (ref 1.7–7.7)
Neutrophils Relative %: 75 %
Platelets: 90 10*3/uL — ABNORMAL LOW (ref 150–400)
RBC: 2.67 MIL/uL — ABNORMAL LOW (ref 4.22–5.81)
RDW: 15.8 % — ABNORMAL HIGH (ref 11.5–15.5)
WBC: 9 10*3/uL (ref 4.0–10.5)
nRBC: 0 % (ref 0.0–0.2)

## 2023-04-25 LAB — BASIC METABOLIC PANEL
Anion gap: 4 — ABNORMAL LOW (ref 5–15)
BUN: 29 mg/dL — ABNORMAL HIGH (ref 8–23)
CO2: 22 mmol/L (ref 22–32)
Calcium: 8 mg/dL — ABNORMAL LOW (ref 8.9–10.3)
Chloride: 115 mmol/L — ABNORMAL HIGH (ref 98–111)
Creatinine, Ser: 3.27 mg/dL — ABNORMAL HIGH (ref 0.61–1.24)
GFR, Estimated: 21 mL/min — ABNORMAL LOW (ref >60)
Glucose, Bld: 102 mg/dL — ABNORMAL HIGH (ref 70–99)
Potassium: 4.3 mmol/L (ref 3.5–5.1)
Sodium: 141 mmol/L (ref 135–145)

## 2023-04-25 LAB — CBG MONITORING, ED
Glucose-Capillary: 132 mg/dL — ABNORMAL HIGH (ref 70–99)
Glucose-Capillary: 124 mg/dL — ABNORMAL HIGH (ref 70–99)
Glucose-Capillary: 123 mg/dL — ABNORMAL HIGH (ref 70–99)

## 2023-04-25 LAB — URINALYSIS, W/ REFLEX TO CULTURE (INFECTION SUSPECTED)
Bilirubin Urine: NEGATIVE
Glucose, UA: >=500 mg/dL — AB
Ketones, ur: NEGATIVE mg/dL
Leukocytes,Ua: NEGATIVE
Nitrite: NEGATIVE
Protein, ur: 100 mg/dL — AB
RBC / HPF: >50 RBC/hpf (ref 0–5)
Specific Gravity, Urine: 1.015 (ref 1.005–1.030)
Squamous Epithelial / HPF: 0 /HPF (ref 0–5)
WBC, UA: >50 WBC/hpf (ref 0–5)
pH: 5 (ref 5.0–8.0)

## 2023-04-25 LAB — VITAMIN B12: Vitamin B-12: 306 pg/mL (ref 180–914)

## 2023-04-25 LAB — IRON AND TIBC
Iron: 46 ug/dL (ref 45–182)
Saturation Ratios: 24 % (ref 17.9–39.5)
TIBC: 193 ug/dL — ABNORMAL LOW (ref 250–450)
UIBC: 147 ug/dL

## 2023-04-25 LAB — TROPONIN I (HIGH SENSITIVITY): Troponin I (High Sensitivity): 7 ng/L (ref <18)

## 2023-04-25 LAB — LACTIC ACID, PLASMA
Lactic Acid, Venous: 1.5 mmol/L (ref 0.5–1.9)
Lactic Acid, Venous: 2.5 mmol/L (ref 0.5–1.9)

## 2023-04-25 LAB — GLUCOSE, CAPILLARY
Glucose-Capillary: 161 mg/dL — ABNORMAL HIGH (ref 70–99)
Glucose-Capillary: 151 mg/dL — ABNORMAL HIGH (ref 70–99)

## 2023-04-25 LAB — FOLATE: Folate: 3.4 ng/mL — ABNORMAL LOW (ref >5.9)

## 2023-04-25 LAB — RETICULOCYTES
Immature Retic Fract: 31.6 % — ABNORMAL HIGH (ref 2.3–15.9)
RBC.: 1.72 MIL/uL — ABNORMAL LOW (ref 4.22–5.81)
Retic Count, Absolute: 100.3 10*3/uL (ref 19.0–186.0)
Retic Ct Pct: 5.8 % — ABNORMAL HIGH (ref 0.4–3.1)

## 2023-04-25 LAB — FERRITIN: Ferritin: 48 ng/mL (ref 24–336)

## 2023-04-25 MED ORDER — LORAZEPAM 2 MG/ML IJ SOLN: 1.0000 mg | INTRAMUSCULAR | Status: DC | PRN

## 2023-04-25 MED ORDER — ATORVASTATIN CALCIUM 20 MG PO TABS
20.0000 mg | ORAL_TABLET | Freq: Every day | ORAL | Status: DC
  Administered 2023-04-25: 20 mg via ORAL
  Filled 2023-04-25: qty 1

## 2023-04-25 MED ORDER — DOLUTEGRAVIR-RILPIVIRINE 50-25 MG PO TABS
1.0000 | ORAL_TABLET | Freq: Every day | ORAL | Status: DC
  Administered 2023-04-25 – 2023-04-26 (×2): 1 via ORAL
  Filled 2023-04-25 (×2): qty 1

## 2023-04-25 MED ORDER — FOLIC ACID 1 MG PO TABS
1.0000 mg | ORAL_TABLET | Freq: Every day | ORAL | Status: DC
  Administered 2023-04-25 – 2023-04-26 (×2): 1 mg via ORAL
  Filled 2023-04-25 (×2): qty 1

## 2023-04-25 MED ORDER — FINASTERIDE 5 MG PO TABS
5.0000 mg | ORAL_TABLET | Freq: Every day | ORAL | Status: DC
  Administered 2023-04-25 – 2023-04-26 (×2): 5 mg via ORAL
  Filled 2023-04-25 (×2): qty 1

## 2023-04-25 NOTE — ED Notes (Signed)
 Pt refused catheter change. Pt educated on benefits of change of catheter. Pt refused to change his clothes. Provider notified and ativan prn ordered. Pt declined medication at this time. Pt refusing to stay hooked up to monitor. CCMD informed. Will continue to monitor.

## 2023-04-25 NOTE — Progress Notes (Signed)
 Progress Note   Patient: Jay Flores:[id number] DOB: [birth year] DOA: 04/24/2023     0 DOS: the patient was seen and examined on 04/25/2023   Brief hospital course: Jay Flores is a 63 y.o. male with medical history significant of hypertension, hyperlipidemia, diabetes mellitus, COPD, CKD-3B, HIV, HCV (s/p of Harvoni), thrombocytopenia, prostate cancer, indwelling foley catheter placement, who presents with syncope.   Patient has noted to have hemoglobin of 5.7, AKI.  He states he was having dark urine, denies rectal bleeding.  FOBT negative in ED.  Admitted to [facility] service for blood transfusions, further management evaluation  Assessment and Plan: Symptomatic anemia Hemoglobin 5.7 upon presentation. Patient got a unit of blood. Repeat Hb 8.6. He reports hematuria, does have foley leak. I discussed with urology who advised to replace foley. No BRBPR or melena.  Iron/ Anemia profile reviewed. Folate level low, will give repletion. He will need close monitoring with PCP for further management of anemia.  Syncopal event: In the setting of severe anemia, dehydration. Continue to monitor vitals closely. Check orthostatic vitals. Will check an echocardiogram.  Prostate cancer: Follows with Dr. Lonna Flores, with whom I discussed foley leak, advised to replace. Patient is going for radiation therapy with Dr. Aggie Flores. Continue Proscar therapy.  Acute kidney injury on CKD stage IIIb: Patient's baseline creatinine around 2 presented with creatinine of 3.5. Patient did receive IV fluids. Will continue to monitor renal function daily. Avoid nephrotoxic drugs.  Type 2 diabetes mellitus: Continue Accu-Cheks and sliding scale insulin as per floor protocol.  Lactic acidosis: Improved with IV hydration. No concern for infection.  HIV Last CD4 on record 258 in 2019. Continue home HAART medications. CD4, viral load pending. Thrombocytopenia-platelet 90K.  Tobacco abuse Counseled  regarding smoking cessation. Nicotine patch ordered.     Out of bed to chair. Incentive spirometry. Nursing supportive care. Fall, aspiration precautions. Diet:  Diet Orders (From admission, onward)     Start     Ordered   04/25/23 0055  Diet heart healthy/carb modified Fluid consistency: Thin  Diet effective now       Question:  Fluid consistency:  Answer:  Thin   04/25/23 0054           DVT prophylaxis: SCDs Start: 04/24/23 2246  Level of care: tele medical   Code Status: Full Code  Subjective: Patient is seen and examined today morning. He is complaining of foley leak. Feels dizzy getting up. Denies headache, blurred vision. No chest pain. Does feel weak.   Physical Exam: Vitals:   04/25/23 0935 04/25/23 1128 04/25/23 1129 04/25/23 1337  BP:  (!) 141/78 (!) 141/78   Pulse:   84   Resp:  (!) 21 20   Temp: 97.8 F (36.6 C)   98.2 F (36.8 C)  TempSrc: Oral   Oral  SpO2:   100%     General - Middle aged African-American male, no apparent distress HEENT - PERRLA, EOMI, atraumatic head, non tender sinuses. Lung - Clear, no rales, rhonchi, wheezes. Heart - S1, S2 heard, no murmurs, rubs, no pedal edema. Abdomen - Soft, non tender, bowel sounds good, foley intact with leak around Neuro - Alert, awake and oriented x 3, non focal exam. Skin - Warm and dry.  Data Reviewed:      Latest Ref Rng & Units 04/25/2023    8:48 AM 04/24/2023    8:11 PM 02/21/2023    1:11 PM  CBC  WBC 4.0 - 10.5 K/uL 9.0  8.0  6.7   Hemoglobin 13.0 - 17.0 g/dL 8.6  5.7  40.9   Hematocrit 39.0 - 52.0 % 26.1  18.1  43.7   Platelets 150 - 400 K/uL 90  143  134       Latest Ref Rng & Units 04/25/2023    7:09 AM 04/24/2023    8:11 PM 02/21/2023    1:11 PM  BMP  Glucose 70 - 99 mg/dL 811  914  96   BUN 8 - 23 mg/dL 29  31  30    Creatinine 0.61 - 1.24 mg/dL 7.82  9.56  2.13   Sodium 135 - 145 mmol/L 141  141  142   Potassium 3.5 - 5.1 mmol/L 4.3  4.3  4.6   Chloride 98 - 111 mmol/L 115   112  111   CO2 22 - 32 mmol/L 22  22  21    Calcium 8.9 - 10.3 mg/dL 8.0  7.7  8.8    DG Chest Port 1 View Result Date: 04/24/2023 CLINICAL DATA:  Questionable sepsis - evaluate for abnormality EXAM: PORTABLE CHEST 1 VIEW COMPARISON:  Chest CT 04/20/2019 FINDINGS: The cardiomediastinal contours are normal. Emphysematous changes on CT are not well demonstrated by radiograph. Pulmonary vasculature is normal. No consolidation, pleural effusion, or pneumothorax. No acute osseous abnormalities are seen. IMPRESSION: No active disease. Electronically Signed   By: Narda Rutherford M.D.   On: 04/24/2023 20:57    Family Communication: Discussed with patient, he understand and agree. All questions answered.  Disposition: Status is: Observation The patient will require care spanning > 2 midnights and should be moved to inpatient because: severe anemia, AKI, foley malfunction  Planned Discharge Destination: Home     Time spent: 39 minutes  Author: Marcelino Duster, MD 04/25/2023 1:55 PM Secure chat 7am to 7pm For on call review [email or web address].

## 2023-04-25 NOTE — ED Notes (Signed)
 This tech and NT Nettie Elm changed patient. Patient has foley but it is leaking. This tech and NT Nettie Elm put brief on patient, changed patients linens, clothing, and blankets with new clean items. Per RN Colin Mulders, the patient has continuously pulled their brief off and put back on wet clothing. Patients wet clothing was put in a patient belonging bag, and stored under the head of the bed. Patient is currently resting with new warm blankets and call bell in hand.

## 2023-04-25 NOTE — ED Notes (Signed)
 Attempted to change pt into gown and change sheets. Pt put back on wet clothes and refused to keep gown or brief on.

## 2023-04-25 NOTE — ED Notes (Addendum)
 This RN spoke with pts sister [name]. [name] states that the pt does not have a court appointed legal guardian but she is the one who helps take care of the pt. I informed her that pt had an inpatient bed and we were ready to move him to the floor but pt was refusing to go to the floor. [name] ask to speak with pt. Pt spoke with [name] and after speaking with her pt states "OK I'll go up there". Pt got up out of the recliner and moved back to the hospital bed.

## 2023-04-25 NOTE — Progress Notes (Signed)
 Patient refused to answer admissions questions.

## 2023-04-25 NOTE — ED Notes (Signed)
Pt to the bathroom via wheelchair.

## 2023-04-26 ENCOUNTER — Ambulatory Visit

## 2023-04-26 DIAGNOSIS — N179 Acute kidney failure, unspecified: Secondary | ICD-10-CM | POA: Diagnosis not present

## 2023-04-26 DIAGNOSIS — D649 Anemia, unspecified: Secondary | ICD-10-CM | POA: Diagnosis not present

## 2023-04-26 DIAGNOSIS — C61 Malignant neoplasm of prostate: Secondary | ICD-10-CM | POA: Diagnosis not present

## 2023-04-26 DIAGNOSIS — R55 Syncope and collapse: Secondary | ICD-10-CM | POA: Diagnosis not present

## 2023-04-26 LAB — HELPER T-LYMPH-CD4 (ARMC ONLY)
% CD 4 Pos. Lymph.: 20.5 % — ABNORMAL LOW (ref 30.8–58.5)
Absolute CD 4 Helper: 349 /uL — ABNORMAL LOW (ref 359–1519)
Basophils Absolute: 0 10*3/uL (ref 0.0–0.2)
Basos: 0 %
EOS (ABSOLUTE): 0.1 10*3/uL (ref 0.0–0.4)
Eos: 1 %
Hematocrit: 24.7 % — ABNORMAL LOW (ref 37.5–51.0)
Hemoglobin: 8.2 g/dL — ABNORMAL LOW (ref 13.0–17.7)
Immature Grans (Abs): 0.1 10*3/uL (ref 0.0–0.1)
Immature Granulocytes: 1 %
Lymphocytes Absolute: 1.7 10*3/uL (ref 0.7–3.1)
Lymphs: 21 %
MCH: 31.8 pg (ref 26.6–33.0)
MCHC: 33.2 g/dL (ref 31.5–35.7)
MCV: 96 fL (ref 79–97)
Monocytes Absolute: 0.5 10*3/uL (ref 0.1–0.9)
Monocytes: 6 %
Neutrophils Absolute: 5.9 10*3/uL (ref 1.4–7.0)
Neutrophils: 71 %
Platelets: 128 10*3/uL — ABNORMAL LOW (ref 150–450)
RBC: 2.58 x10E6/uL — ABNORMAL LOW (ref 4.14–5.80)
RDW: 13.9 % (ref 11.6–15.4)
WBC: 8.2 10*3/uL (ref 3.4–10.8)

## 2023-04-26 LAB — HIV-1 RNA QUANT-NO REFLEX-BLD
HIV 1 RNA Quant: 70 {copies}/mL
LOG10 HIV-1 RNA: 1.845 {Log_copies}/mL

## 2023-04-26 LAB — BPAM RBC
Blood Product Expiration Date: 202503302359
ISSUE DATE / TIME: 202503192318
ISSUE DATE / TIME: 202503200242
ISSUE DATE / TIME: 202504182359
Unit Type and Rh: 202503302359
Unit Type and Rh: 202504182359
Unit Type and Rh: 600
Unit Type and Rh: 6200

## 2023-04-26 LAB — BASIC METABOLIC PANEL
Anion gap: 8 (ref 5–15)
BUN: 26 mg/dL — ABNORMAL HIGH (ref 8–23)
CO2: 21 mmol/L — ABNORMAL LOW (ref 22–32)
Calcium: 8.1 mg/dL — ABNORMAL LOW (ref 8.9–10.3)
Chloride: 110 mmol/L (ref 98–111)
Creatinine, Ser: 2.89 mg/dL — ABNORMAL HIGH (ref 0.61–1.24)
GFR, Estimated: 24 mL/min — ABNORMAL LOW (ref >60)
Glucose, Bld: 111 mg/dL — ABNORMAL HIGH (ref 70–99)
Potassium: 4 mmol/L (ref 3.5–5.1)
Sodium: 139 mmol/L (ref 135–145)

## 2023-04-26 LAB — CBC
HCT: 25.2 % — ABNORMAL LOW (ref 39.0–52.0)
Hemoglobin: 8.4 g/dL — ABNORMAL LOW (ref 13.0–17.0)
MCH: 31.5 pg (ref 26.0–34.0)
MCHC: 33.3 g/dL (ref 30.0–36.0)
MCV: 94.4 fL (ref 80.0–100.0)
Platelets: 136 10*3/uL — ABNORMAL LOW (ref 150–400)
RBC: 2.67 MIL/uL — ABNORMAL LOW (ref 4.22–5.81)
RDW: 15.7 % — ABNORMAL HIGH (ref 11.5–15.5)
WBC: 8.4 10*3/uL (ref 4.0–10.5)
nRBC: 0 % (ref 0.0–0.2)

## 2023-04-26 LAB — TYPE AND SCREEN
ABO/RH(D): A POS
Antibody Screen: NEGATIVE
Unit division: 0
Unit division: 0

## 2023-04-26 LAB — URINE CULTURE

## 2023-04-26 LAB — GLUCOSE, CAPILLARY: Glucose-Capillary: 106 mg/dL — ABNORMAL HIGH (ref 70–99)

## 2023-04-26 MED ORDER — NICOTINE 21 MG/24HR TD PT24: 21.0000 mg | MEDICATED_PATCH | Freq: Every day | TRANSDERMAL | 0 refills | Status: DC

## 2023-04-26 MED ORDER — FOLIC ACID 1 MG PO TABS: 1.0000 mg | ORAL_TABLET | Freq: Every day | ORAL | 1 refills | Status: DC

## 2023-04-26 NOTE — Progress Notes (Addendum)
 Patient report given to [facility].  All questions are answered via phone. No any questions at this time.pt is going to group home with foley's.

## 2023-04-26 NOTE — Evaluation (Signed)
 Physical Therapy Evaluation Patient Details Name: Jay Flores MRN: [id number] DOB: 02-27-1960 Today's Date: 04/26/2023  History of Present Illness  Jay Flores is a 63 y.o. male with medical history significant of hypertension, hyperlipidemia, diabetes mellitus, COPD, CKD-3B, HIV, HCV (s/p of Harvoni), thrombocytopenia, prostate cancer, indwelling foley catheter placement, who presents with syncope. Patient has noted to have hemoglobin of 5.7, AKI.   Clinical Impression  Pt initially pleasant but grows aggravated and frustrated by PT presence and questions/tasks. Pt poor historian, per chart review pt is from a group home, TOC stated he does have a walker and WC that he uses there. ModI for bed mobility, able to stand with RW CGA and ambulate ~56ft. RW very outside BOS. Pt noted for very very narrow base of support, tandem walking with very short steps. Pt often caught his foot on other foot in preswing/swing phase. Pt self limiting of distance and resistant to any education. Able to take catheter off RW, place on bed hook, and return to sitting CGA. Pt appears to be near baseline level of functioning, no acute PT needs at this time, please re-consult if needs arise.         If plan is discharge home, recommend the following: A little help with walking and/or transfers;A little help with bathing/dressing/bathroom;Assistance with cooking/housework;Supervision due to cognitive status;Assist for transportation;Help with stairs or ramp for entrance   Can travel by private vehicle        Equipment Recommendations None recommended by PT  Recommendations for Other Services       Functional Status Assessment Patient has not had a recent decline in their functional status     Precautions / Restrictions Precautions Precautions: Fall Recall of Precautions/Restrictions: Impaired Restrictions Weight Bearing Restrictions Per Provider Order: No      Mobility  Bed Mobility Overal bed  mobility: Modified Independent                  Transfers Overall transfer level: Needs assistance Equipment used: Rolling walker (2 wheels) Transfers: Sit to/from Stand Sit to Stand: Contact guard assist           General transfer comment: RW outside BOS    Ambulation/Gait Ambulation/Gait assistance: Contact guard assist Gait Distance (Feet): 12 Feet Assistive device: Rolling walker (2 wheels)         General Gait Details: RW way outside BOS, noted for very very narrow base of support, tandem walking with very short steps. noted to catch foot on other foot in preswing/swing phase  Stairs            Wheelchair Mobility     Tilt Bed    Modified Rankin (Stroke Patients Only)       Balance Overall balance assessment: Needs assistance Sitting-balance support: Feet supported Sitting balance-Leahy Scale: Good     Standing balance support: Bilateral upper extremity supported Standing balance-Leahy Scale: Poor                               Pertinent Vitals/Pain Pain Assessment Pain Assessment: Faces Faces Pain Scale: No hurt    Home Living Family/patient expects to be discharged to:: Group home                   Additional Comments: pt chart review ramp to enter home, CSW stated pt has RW and WC    Prior Function Prior Level of Function : Patient poor  historian/Family not available                     Extremity/Trunk Assessment        Lower Extremity Assessment Lower Extremity Assessment:  (pt able to lift BLE off bed but limited ankle and knee ROM noted)       Communication        Cognition Arousal: Alert Behavior During Therapy: Agitated, Flat affect, WFL for tasks assessed/performed                             Following commands: Impaired Following commands impaired: Follows one step commands inconsistently     Cueing       General Comments      Exercises     Assessment/Plan     PT Assessment Patient does not need any further PT services  PT Problem List         PT Treatment Interventions      PT Goals (Current goals can be found in the Care Plan section)       Frequency       Co-evaluation               AM-PAC PT "6 Clicks" Mobility  Outcome Measure Help needed turning from your back to your side while in a flat bed without using bedrails?: None Help needed moving from lying on your back to sitting on the side of a flat bed without using bedrails?: None Help needed moving to and from a bed to a chair (including a wheelchair)?: A Little Help needed standing up from a chair using your arms (e.g., wheelchair or bedside chair)?: A Little Help needed to walk in hospital room?: A Little Help needed climbing 3-5 steps with a railing? : A Lot 6 Click Score: 19    End of Session   Activity Tolerance: Patient tolerated treatment well Patient left: in bed;with call bell/phone within reach;with bed alarm set Nurse Communication: Mobility status PT Visit Diagnosis: Other abnormalities of gait and mobility (R26.89)    Time: 1020-1030 PT Time Calculation (min) (ACUTE ONLY): 10 min   Charges:   PT Evaluation $PT Eval Low Complexity: 1 Low   PT General Charges $$ ACUTE PT VISIT: 1 Visit        Olga Coaster PT, DPT 11:40 AM,04/26/23

## 2023-04-26 NOTE — Plan of Care (Signed)
  Problem: Coping: Goal: Ability to adjust to condition or change in health will improve Outcome: Progressing   Problem: Education: Goal: Ability to describe self-care measures that may prevent or decrease complications (Diabetes Survival Skills Education) will improve Outcome: Progressing Goal: Individualized Educational Video(s) Outcome: Progressing   Problem: Coping: Goal: Ability to adjust to condition or change in health will improve Outcome: Progressing   Problem: Health Behavior/Discharge Planning: Goal: Ability to identify and utilize available resources and services will improve Outcome: Progressing Goal: Ability to manage health-related needs will improve Outcome: Progressing   Problem: Metabolic: Goal: Ability to maintain appropriate glucose levels will improve Outcome: Progressing   Problem: Nutritional: Goal: Maintenance of adequate nutrition will improve Outcome: Progressing Goal: Progress toward achieving an optimal weight will improve Outcome: Progressing   Problem: Skin Integrity: Goal: Risk for impaired skin integrity will decrease Outcome: Progressing   Problem: Tissue Perfusion: Goal: Adequacy of tissue perfusion will improve Outcome: Progressing   Problem: Education: Goal: Knowledge of General Education information will improve Description: Including pain rating scale, medication(s)/side effects and non-pharmacologic comfort measures Outcome: Progressing   Problem: Health Behavior/Discharge Planning: Goal: Ability to manage health-related needs will improve Outcome: Progressing   Problem: Clinical Measurements: Goal: Ability to maintain clinical measurements within normal limits will improve Outcome: Progressing Goal: Will remain free from infection Outcome: Progressing Goal: Diagnostic test results will improve Outcome: Progressing Goal: Respiratory complications will improve Outcome: Progressing Goal: Cardiovascular complication will be  avoided Outcome: Progressing   Problem: Activity: Goal: Risk for activity intolerance will decrease Outcome: Progressing   Problem: Nutrition: Goal: Adequate nutrition will be maintained Outcome: Progressing   Problem: Coping: Goal: Level of anxiety will decrease Outcome: Progressing   Problem: Elimination: Goal: Will not experience complications related to bowel motility Outcome: Progressing Goal: Will not experience complications related to urinary retention Outcome: Progressing

## 2023-04-26 NOTE — TOC Initial Note (Addendum)
 Transition of Care [facility]) - Initial/Assessment Note    Patient Details  Name: Jay Flores MRN: [id number] Date of Birth: 1960-10-09  Transition of Care [facility]) CM/SW Contact:    Cherre Blanc, RN Phone Number: 04/26/2023, 10:21 AM  Clinical Narrative:                 TOC spoke with the patient in the room. Per the patient he lives with his sister, Noel Christmas [phone]. TOC outreached to [facility] and she advised that the patient  lives in a group home. Group home contact is Jarold Motto ph [phone]  Fx [phone]. The patient uses a rolling walker and wheelchair at the group home. Patient will dc to group home when he is medically clear for dc    Patient Goals and CMS Choice            Expected Discharge Plan and Services         Expected Discharge Date: 04/26/23                                    Prior Living Arrangements/Services                       Activities of Daily Living   ADL Screening (condition at time of admission) Independently performs ADLs?: No Does the patient have a NEW difficulty with bathing/dressing/toileting/self-feeding that is expected to last >3 days?: No Does the patient have a NEW difficulty with getting in/out of bed, walking, or climbing stairs that is expected to last >3 days?: No Does the patient have a NEW difficulty with communication that is expected to last >3 days?: No Is the patient deaf or have difficulty hearing?: No Does the patient have difficulty seeing, even when wearing glasses/contacts?: No Does the patient have difficulty concentrating, remembering, or making decisions?: Yes  Permission Sought/Granted                  Emotional Assessment              Admission diagnosis:  AKI (acute kidney injury) (HCC) [N17.9] Symptomatic anemia [D64.9] Syncope, unspecified syncope type [R55] Anemia, unspecified type [D64.9] Patient Active Problem List   Diagnosis Date Noted   Symptomatic anemia  04/24/2023   HLD (hyperlipidemia) 04/24/2023   Syncope 04/24/2023   Acute renal failure superimposed on stage 3b chronic kidney disease (HCC) 04/24/2023   Type II diabetes mellitus with renal manifestations (HCC) 04/24/2023   Prostate cancer (HCC) 04/24/2023   Elevated lactic acid level 04/24/2023   Pancreatic cyst 03/23/2022   CKD (chronic kidney disease) stage 3, GFR 30-59 ml/min (HCC) 03/23/2022   Thrombocytopenia (HCC) 03/23/2022   Callus 11/23/2021   Pain due to onychomycosis of toenails of both feet 06/15/2021   Diabetic neuropathy (HCC) 06/15/2021   Bladder dysfunction 05/07/2021   H/O urinary retention 05/07/2021   Tobacco use 04/17/2018   DKA (diabetic ketoacidosis) (HCC) 03/29/2017   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   Leg weakness, bilateral 01/18/2016   Difficulty walking 12/19/2015   Hyperreflexia of lower extremity 12/19/2015   Muscle spasm of both lower legs 12/19/2015   HTN (hypertension) 02/19/2012   Weakness 02/19/2012   Metabolic acidosis 01/27/2012   Hypokalemia 01/23/2012   PNA (pneumonia) 01/22/2012   HIV (human immunodeficiency virus infection) (HCC) 01/22/2012   Brain lesion 01/22/2012   Acute renal failure (HCC) 01/22/2012   Anemia 01/22/2012  Altered mental status 01/22/2012   Hepatitis C infection 10/05/2011   PCP:  Mick Sell, MD Pharmacy:   [facility] [place] [address] [place] [address] Phone: [phone] Fax: [phone]     Social Drivers of Health (SDOH) Social History: SDOH Screenings   Food Insecurity: No Food Insecurity (04/25/2023)  Housing: Low Risk  (04/25/2023)  Transportation Needs: No Transportation Needs (04/25/2023)  Utilities: Not At Risk (04/25/2023)  Depression (PHQ2-9): Low Risk  (03/23/2022)  Tobacco Use: High Risk (04/25/2023)   SDOH Interventions:     Readmission Risk Interventions     No data to display

## 2023-04-26 NOTE — Progress Notes (Signed)
 Patient is alert and oriented X 4 with intermittent forgetful.pt is refused to go home and he has been saying  he needs money because he has been staying here since last night. Md made aware. Got help from the  security. Plan of care ongoing.

## 2023-04-26 NOTE — TOC Transition Note (Signed)
 Transition of Care [facility]) - Discharge Note   Patient Details  Name: Jay Flores MRN: [id number] Date of Birth: [birth year]  [facility]) CM/SW Contact:  Cherre Blanc, RN Phone Number: 04/26/2023, 10:26 AM   Clinical Narrative:     TOC outreached to Group home contact is Jarold Motto ph [phone]. [name] requested that the Nurse call when the patient is ready for dc. The Group home staff will transport the patient from the hospital. No additional dc needs identified         Patient Goals and CMS Choice            Discharge Placement                       Discharge Plan and Services Additional resources added to the After Visit Summary for                                       Social Drivers of Health (SDOH) Interventions SDOH Screenings   Food Insecurity: No Food Insecurity (04/25/2023)  Housing: Low Risk  (04/25/2023)  Transportation Needs: No Transportation Needs (04/25/2023)  Utilities: Not At Risk (04/25/2023)  Depression (PHQ2-9): Low Risk  (03/23/2022)  Tobacco Use: High Risk (04/25/2023)     Readmission Risk Interventions     No data to display

## 2023-04-26 NOTE — Discharge Summary (Signed)
 Physician Discharge Summary   Patient: Jay Flores MRN: [id number] DOB: 1960/02/23  Admit date:     04/24/2023  Discharge date: 04/26/23  Discharge Physician: Marcelino Duster   PCP: Mick Sell, MD   Recommendations at discharge:    PCP follow up in 1 week advised. Primary radiation onc, urology follow up as scheduled.  Discharge Diagnoses: Principal Problem:   Symptomatic anemia Active Problems:   Syncope   Prostate cancer (HCC)   Acute renal failure superimposed on stage 3b chronic kidney disease (HCC)   HTN (hypertension)   Type II diabetes mellitus with renal manifestations (HCC)   HLD (hyperlipidemia)   Elevated lactic acid level   Thrombocytopenia (HCC)   HIV (human immunodeficiency virus infection) (HCC)   Tobacco use  Resolved Problems:   * No resolved hospital problems. *  Hospital Course: KAYLEB WARSHAW is a 63 y.o. male with medical history significant of hypertension, hyperlipidemia, diabetes mellitus, COPD, CKD-3B, HIV, HCV (s/p of Harvoni), thrombocytopenia, prostate cancer, indwelling foley catheter placement, who presents with syncope.    Patient has noted to have hemoglobin of 5.7, AKI.  He states he was having dark urine, denies rectal bleeding.  FOBT negative in ED.  Admitted to [facility] service for blood transfusions, further management evaluation  Assessment and Plan: Symptomatic anemia Hemoglobin 5.7 upon presentation. Patient got a unit of blood. Repeat Hb 8.6. He reports hematuria, no BRBPR or melena.  Iron/ Anemia profile reviewed. Folate level low, repletion ordered. He will need close monitoring with PCP for further management of anemia.   Syncopal event: In the setting of severe anemia, dehydration. Continue to monitor vitals closely. Outpatient echocardiogram advised.   Prostate cancer: Follows with Dr. Lonna Cobb, with whom I discussed foley leak. Replaced Coude cath.  Patient is going for radiation therapy with Dr.  Aggie Cosier. Continue Proscar therapy.   Acute kidney injury on CKD stage IIIb: Patient's baseline creatinine around 2 presented with creatinine of 3.5. Patient did receive IV fluids with improvement of kidney function. Monitor renal function as outpatient. Avoid nephrotoxic drugs.   Type 2 diabetes mellitus: Home regimen resumed.   Lactic acidosis: Improved with IV hydration. No concern for infection.   HIV Last CD4 on record 258 in 2019. Continue home HAART medications. CD4, viral load pending. Thrombocytopenia-platelet 90K.   Tobacco abuse Counseled regarding smoking cessation. Nicotine patch ordered.       Consultants: none Procedures performed: replaced coude cath  Disposition: Group home Diet recommendation:  Discharge Diet Orders (From admission, onward)     Start     Ordered   04/26/23 0000  Diet - low sodium heart healthy        04/26/23 0947   04/26/23 0000  Diet Carb Modified        04/26/23 0947           Cardiac diet DISCHARGE MEDICATION: Allergies as of 04/26/2023   No Known Allergies      Medication List     TAKE these medications    Accu-Chek Aviva Plus test strip Generic drug: glucose blood 1 each by Other route in the morning, at noon, and at bedtime. Use as instructed   Accu-Chek Softclix Lancets lancets by Other route. Use as instructed   atorvastatin 20 MG tablet Commonly known as: LIPITOR Take 20 mg by mouth at bedtime.   cholecalciferol 25 MCG (1000 UNIT) tablet Commonly known as: VITAMIN D3 Take 1,000 Units by mouth at bedtime.   finasteride 5 MG tablet  Commonly known as: PROSCAR Take 5 mg by mouth daily.   folic acid 1 MG tablet Commonly known as: FOLVITE Take 1 tablet (1 mg total) by mouth daily. Start taking on: April 27, 2023   FreeStyle Libre 14 Day Reader Hardie Pulley Apply topically.   FreeStyle Libre 14 Day Sensor Misc Apply topically.   Jardiance 25 MG Tabs tablet Generic drug: empagliflozin Take 25 mg by  mouth daily.   Juluca 50-25 MG tablet Generic drug: dolutegravir-rilpivirine Take 1 tablet by mouth daily.   losartan 100 MG tablet Commonly known as: COZAAR Take 100 mg by mouth daily.   metoprolol succinate 100 MG 24 hr tablet Commonly known as: TOPROL-XL Take 100 mg by mouth daily.   neomycin-bacitracin-polymyxin [phone] Oint Apply 1 Application topically daily.   nicotine 21 mg/24hr patch Commonly known as: NICODERM CQ - dosed in mg/24 hours Place 1 patch (21 mg total) onto the skin daily. Start taking on: April 27, 2023   sodium bicarbonate 650 MG tablet Take 650 mg by mouth 2 (two) times daily.   Tylenol 8 Hour Arthritis Pain 650 MG CR tablet Generic drug: acetaminophen Take 650 mg by mouth every 4 (four) hours as needed for pain.        Follow-up Information     Mick Sell, MD Follow up.   Specialty: Infectious Diseases Why: Hospital follow up Contact information: [address] [place] [address] [phone]         Riki Altes, MD Follow up in 1 week(s).   Specialty: Urology Contact information: [address] [place] [address] [phone]                Discharge Exam:    04/26/2023    7:47 AM 04/26/2023    4:32 AM 04/26/2023   12:03 AM  Vitals with BMI  Systolic 139 138 259  Diastolic 69 76 73  Pulse 77 73 75    General - Middle aged African-American male, no apparent distress HEENT - PERRLA, EOMI, atraumatic head, non tender sinuses. Lung - Clear, no rales, rhonchi, wheezes. Heart - S1, S2 heard, no murmurs, rubs, no pedal edema. Abdomen - Soft, non tender, bowel sounds good, Coude cath with dark urine. Neuro - Alert, awake and oriented x 3, non focal exam. Skin - Warm and dry.  Condition at discharge: stable  The results of significant diagnostics from this hospitalization (including imaging, microbiology, ancillary and laboratory) are listed below for reference.   Imaging  Studies: DG Chest Port 1 View Result Date: 04/24/2023 CLINICAL DATA:  Questionable sepsis - evaluate for abnormality EXAM: PORTABLE CHEST 1 VIEW COMPARISON:  Chest CT 04/20/2019 FINDINGS: The cardiomediastinal contours are normal. Emphysematous changes on CT are not well demonstrated by radiograph. Pulmonary vasculature is normal. No consolidation, pleural effusion, or pneumothorax. No acute osseous abnormalities are seen. IMPRESSION: No active disease. Electronically Signed   By: Narda Rutherford M.D.   On: 04/24/2023 20:57    Microbiology: Results for orders placed or performed during the hospital encounter of 04/24/23  Blood Culture (routine x 2)     Status: None (Preliminary result)   Collection Time: 04/24/23  8:39 PM   Specimen: BLOOD  Result Value Ref Range Status   Specimen Description BLOOD BLOOD LEFT FOREARM  Final   Special Requests   Final    BOTTLES DRAWN AEROBIC AND ANAEROBIC Blood Culture results may not be optimal due to an inadequate volume of blood received in culture  bottles   Culture   Final    NO GROWTH 2 DAYS Performed at [facility], [address] [place], [place] [address]    Report Status PENDING  Incomplete  Blood Culture (routine x 2)     Status: None (Preliminary result)   Collection Time: 04/24/23  9:40 PM   Specimen: BLOOD  Result Value Ref Range Status   Specimen Description BLOOD BLOOD RIGHT ARM  Final   Special Requests   Final    BOTTLES DRAWN AEROBIC AND ANAEROBIC Blood Culture adequate volume   Culture   Final    NO GROWTH 2 DAYS Performed at [facility], [address] [place], [place] [address]    Report Status PENDING  Incomplete    Labs: CBC: Recent Labs  Lab 04/24/23 2011 04/25/23 0848 04/26/23 0449  WBC 8.0 9.0 8.4  NEUTROABS 5.6 6.7  --   HGB 5.7* 8.6* 8.4*  HCT 18.1* 26.1* 25.2*  MCV 105.2* 97.8 94.4  PLT 143* 90* 136*   Basic Metabolic Panel: Recent Labs  Lab 04/24/23 2011 04/25/23 0709  04/26/23 0449  NA 141 141 139  K 4.3 4.3 4.0  CL 112* 115* 110  CO2 22 22 21*  GLUCOSE 191* 102* 111*  BUN 31* 29* 26*  CREATININE 3.52* 3.27* 2.89*  CALCIUM 7.7* 8.0* 8.1*   Liver Function Tests: Recent Labs  Lab 04/24/23 2011  AST 16  ALT 10  ALKPHOS 52  BILITOT 0.3  PROT 5.4*  ALBUMIN 2.5*   CBG: Recent Labs  Lab 04/25/23 1231 04/25/23 1638 04/25/23 1814 04/25/23 2147 04/26/23 0748  GLUCAP 124* 123* 161* 151* 106*    Discharge time spent: 34 minutes.  Signed: Marcelino Duster, MD [facility] 04/26/2023

## 2023-04-26 NOTE — [place] FL2 (Signed)
 [place] MEDICAID FL2 LEVEL OF CARE FORM     IDENTIFICATION  Patient Name: Jay Flores Birthdate: July 21, 1960 Sex: male Admission Date (Current Location): 04/24/2023  [facility] and [place] Number:  Chiropodist and Address:  [facility], [address] [place], [place] [address]      Provider Number: [id number]  Attending Physician Name and Address:  No att. providers found  Relative Name and Phone Number:  [name]  [phone]    Current Level of Care: Hospital Recommended Level of Care: Family Care Home Prior Approval Number:    Date Approved/Denied:   PASRR Number:    Discharge Plan:      Current Diagnoses: Patient Active Problem List   Diagnosis Date Noted   Symptomatic anemia 04/24/2023   HLD (hyperlipidemia) 04/24/2023   Syncope 04/24/2023   Acute renal failure superimposed on stage 3b chronic kidney disease (HCC) 04/24/2023   Type II diabetes mellitus with renal manifestations (HCC) 04/24/2023   Prostate cancer (HCC) 04/24/2023   Elevated lactic acid level 04/24/2023   Pancreatic cyst 03/23/2022   CKD (chronic kidney disease) stage 3, GFR 30-59 ml/min (HCC) 03/23/2022   Thrombocytopenia (HCC) 03/23/2022   Callus 11/23/2021   Pain due to onychomycosis of toenails of both feet 06/15/2021   Diabetic neuropathy (HCC) 06/15/2021   Bladder dysfunction 05/07/2021   H/O urinary retention 05/07/2021   Tobacco use 04/17/2018   DKA (diabetic ketoacidosis) (HCC) 03/29/2017   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   Leg weakness, bilateral 01/18/2016   Difficulty walking 12/19/2015   Hyperreflexia of lower extremity 12/19/2015   Muscle spasm of both lower legs 12/19/2015   HTN (hypertension) 02/19/2012   Weakness 02/19/2012   Metabolic acidosis 01/27/2012   Hypokalemia 01/23/2012   PNA (pneumonia) 01/22/2012   HIV (human immunodeficiency virus infection) (HCC) 01/22/2012   Brain lesion 01/22/2012   Acute renal failure (HCC) 01/22/2012    Anemia 01/22/2012   Altered mental status 01/22/2012   Hepatitis C infection 10/05/2011    Orientation RESPIRATION BLADDER Height & Weight     Self, Time, Place    External catheter Weight:   Height:     BEHAVIORAL SYMPTOMS/MOOD NEUROLOGICAL BOWEL NUTRITION STATUS      Continent    AMBULATORY STATUS COMMUNICATION OF NEEDS Skin   Limited Assist Verbally                         Personal Care Assistance Level of Assistance  Bathing, Feeding, Dressing Bathing Assistance: Limited assistance Feeding assistance: Limited assistance Dressing Assistance: Limited assistance     Functional Limitations Info  Sight, Hearing, Speech Sight Info: Adequate Hearing Info: Adequate Speech Info: Adequate    SPECIAL CARE FACTORS FREQUENCY                       Contractures      Additional Factors Info  Code Status, Allergies Code Status Info: Full Allergies Info: No Known Allergies               Discharge Medications:   Current Medication List-Note: Take only these medications. Stop taking all medications not included on this list. If you have any questions regarding medications not on this list, please follow up with your healthcare provider. Bring this list to your next appointment.    Medication Details Next Dose Due Morning Afternoon Evening Bedtime As Needed   Accu-Chek Aviva Plus test strip 1 each by Other route  in the morning, at noon, and at bedtime. Use as instructed Generic drug: glucose blood  As order        Accu-Chek Softclix Lancets lancets by Other route. Use as instructed  As order        atorvastatin 20 MG tablet Commonly known as: LIPITOR Take 20 mg by mouth at bedtime.  04/26/2022        cholecalciferol 25 MCG (1000 UNIT) tablet Commonly known as: VITAMIN D3 Take 1,000 Units by mouth at bedtime.  04/26/2023        finasteride 5 MG tablet Commonly known as: PROSCAR Take 5 mg by mouth daily.  04/27/2023        folic acid 1 MG tablet Commonly  known as: FOLVITE Start taking on: April 27, 2023 Take 1 tablet (1 mg total) by mouth daily.  04/27/2023        FreeStyle Libre 14 Day Reader Hardie Pulley Apply topically.  As order        FreeStyle Libre 14 Day Sensor Misc Apply topically.  As order        Jardiance 25 MG Tabs tablet Take 25 mg by mouth daily. Generic drug: empagliflozin  As order        Juluca 50-25 MG tablet Take 1 tablet by mouth daily. Generic drug: dolutegravir-rilpivirine  04/27/2023        losartan 100 MG tablet Commonly known as: COZAAR Take 100 mg by mouth daily.  As order        metoprolol succinate 100 MG 24 hr tablet Commonly known as: TOPROL-XL Take 100 mg by mouth daily.  As order        neomycin-bacitracin-polymyxin [phone] Oint Apply 1 Application topically daily.  As order        nicotine 21 mg/24hr patch Commonly known as: NICODERM CQ - dosed in mg/24 hours Start taking on: April 27, 2023 Place 1 patch (21 mg total) onto the skin daily.  04/27/2023        sodium bicarbonate 650 MG tablet Take 650 mg by mouth 2 (two) times daily.  As order        Tylenol 8 Hour Arthritis Pain 650 MG CR tablet Take 650 mg by mouth every 4 (four) hours as needed for pain. Generic drug: acetaminophen           Relevant Imaging Results:  Relevant Lab Results:   Additional Information    Cherre Blanc, RN

## 2023-04-29 ENCOUNTER — Other Ambulatory Visit: Payer: Self-pay

## 2023-04-29 ENCOUNTER — Ambulatory Visit
Admission: RE | Admit: 2023-04-29 | Discharge: 2023-04-29 | Disposition: A | Discharge status: Home / Self Care | Source: Ambulatory Visit | Attending: Radiation Oncology | Admitting: Radiation Oncology

## 2023-04-29 DIAGNOSIS — C61 Malignant neoplasm of prostate: Secondary | ICD-10-CM | POA: Diagnosis not present

## 2023-04-29 LAB — RAD ONC ARIA SESSION SUMMARY
Course Elapsed Days: 0
Plan Fractions Treated to Date: 1
Plan Prescribed Dose Per Fraction: 2 Gy
Plan Total Fractions Prescribed: 40
Plan Total Prescribed Dose: 80 Gy
Reference Point Dosage Given to Date: 2 Gy
Reference Point Session Dosage Given: 2 Gy
Session Number: 1

## 2023-04-29 LAB — CULTURE, BLOOD (ROUTINE X 2)
Culture: NO GROWTH
Culture: NO GROWTH
Special Requests: ADEQUATE

## 2023-04-30 ENCOUNTER — Ambulatory Visit
Admission: RE | Admit: 2023-04-30 | Discharge: 2023-04-30 | Disposition: A | Discharge status: Home / Self Care | Source: Ambulatory Visit | Attending: Radiation Oncology | Admitting: Radiation Oncology

## 2023-04-30 ENCOUNTER — Other Ambulatory Visit: Payer: Self-pay

## 2023-04-30 DIAGNOSIS — C61 Malignant neoplasm of prostate: Secondary | ICD-10-CM | POA: Diagnosis not present

## 2023-04-30 LAB — RAD ONC ARIA SESSION SUMMARY
Course Elapsed Days: 1
Plan Fractions Treated to Date: 2
Plan Prescribed Dose Per Fraction: 2 Gy
Plan Total Fractions Prescribed: 40
Plan Total Prescribed Dose: 80 Gy
Reference Point Dosage Given to Date: 4 Gy
Reference Point Session Dosage Given: 2 Gy
Session Number: 2

## 2023-05-01 ENCOUNTER — Ambulatory Visit
Admission: RE | Admit: 2023-05-01 | Discharge: 2023-05-01 | Disposition: A | Discharge status: Home / Self Care | Source: Ambulatory Visit | Attending: Radiation Oncology | Admitting: Radiation Oncology

## 2023-05-01 ENCOUNTER — Other Ambulatory Visit: Payer: Self-pay

## 2023-05-01 ENCOUNTER — Inpatient Hospital Stay

## 2023-05-01 DIAGNOSIS — C61 Malignant neoplasm of prostate: Secondary | ICD-10-CM | POA: Insufficient documentation

## 2023-05-01 LAB — RAD ONC ARIA SESSION SUMMARY
Course Elapsed Days: 2
Plan Fractions Treated to Date: 3
Plan Prescribed Dose Per Fraction: 2 Gy
Plan Total Fractions Prescribed: 40
Plan Total Prescribed Dose: 80 Gy
Reference Point Dosage Given to Date: 6 Gy
Reference Point Session Dosage Given: 2 Gy
Session Number: 3

## 2023-05-01 LAB — CBC (CANCER CENTER ONLY)
HCT: 33.4 % — ABNORMAL LOW (ref 39.0–52.0)
Hemoglobin: 10.5 g/dL — ABNORMAL LOW (ref 13.0–17.0)
MCH: 31.2 pg (ref 26.0–34.0)
MCHC: 31.4 g/dL (ref 30.0–36.0)
MCV: 99.1 fL (ref 80.0–100.0)
Platelet Count: 166 10*3/uL (ref 150–400)
RBC: 3.37 MIL/uL — ABNORMAL LOW (ref 4.22–5.81)
RDW: 14.6 % (ref 11.5–15.5)
WBC Count: 8.8 10*3/uL (ref 4.0–10.5)
nRBC: 0 % (ref 0.0–0.2)

## 2023-05-02 ENCOUNTER — Ambulatory Visit
Admission: RE | Admit: 2023-05-02 | Discharge: 2023-05-02 | Disposition: A | Discharge status: Home / Self Care | Source: Ambulatory Visit | Attending: Radiation Oncology | Admitting: Radiation Oncology

## 2023-05-02 ENCOUNTER — Other Ambulatory Visit: Payer: Self-pay

## 2023-05-02 DIAGNOSIS — C61 Malignant neoplasm of prostate: Secondary | ICD-10-CM | POA: Diagnosis not present

## 2023-05-02 LAB — RAD ONC ARIA SESSION SUMMARY
Course Elapsed Days: 3
Plan Fractions Treated to Date: 4
Plan Prescribed Dose Per Fraction: 2 Gy
Plan Total Fractions Prescribed: 40
Plan Total Prescribed Dose: 80 Gy
Reference Point Dosage Given to Date: 8 Gy
Reference Point Session Dosage Given: 2 Gy
Session Number: 4

## 2023-05-03 ENCOUNTER — Other Ambulatory Visit: Payer: Self-pay

## 2023-05-03 ENCOUNTER — Ambulatory Visit
Admission: RE | Admit: 2023-05-03 | Discharge: 2023-05-03 | Disposition: A | Discharge status: Home / Self Care | Source: Ambulatory Visit | Attending: Radiation Oncology | Admitting: Radiation Oncology

## 2023-05-03 DIAGNOSIS — C61 Malignant neoplasm of prostate: Secondary | ICD-10-CM | POA: Diagnosis not present

## 2023-05-03 LAB — RAD ONC ARIA SESSION SUMMARY
Course Elapsed Days: 4
Plan Fractions Treated to Date: 5
Plan Prescribed Dose Per Fraction: 2 Gy
Plan Total Fractions Prescribed: 40
Plan Total Prescribed Dose: 80 Gy
Reference Point Dosage Given to Date: 10 Gy
Reference Point Session Dosage Given: 2 Gy
Session Number: 5

## 2023-05-03 MED FILL — Lactated Ringer's Solution: INTRAVENOUS | Qty: 1000 | Status: AC

## 2023-05-06 ENCOUNTER — Other Ambulatory Visit: Payer: Self-pay

## 2023-05-06 ENCOUNTER — Ambulatory Visit
Admission: RE | Admit: 2023-05-06 | Discharge: 2023-05-06 | Disposition: A | Discharge status: Home / Self Care | Source: Ambulatory Visit | Attending: Radiation Oncology | Admitting: Radiation Oncology

## 2023-05-06 DIAGNOSIS — C61 Malignant neoplasm of prostate: Secondary | ICD-10-CM | POA: Diagnosis not present

## 2023-05-06 LAB — RAD ONC ARIA SESSION SUMMARY
Course Elapsed Days: 7
Plan Fractions Treated to Date: 6
Plan Prescribed Dose Per Fraction: 2 Gy
Plan Total Fractions Prescribed: 40
Plan Total Prescribed Dose: 80 Gy
Reference Point Dosage Given to Date: 12 Gy
Reference Point Session Dosage Given: 2 Gy
Session Number: 6

## 2023-05-07 ENCOUNTER — Ambulatory Visit
Admission: RE | Admit: 2023-05-07 | Discharge: 2023-05-07 | Disposition: A | Discharge status: Home / Self Care | Source: Ambulatory Visit | Attending: Radiation Oncology | Admitting: Radiation Oncology

## 2023-05-07 ENCOUNTER — Other Ambulatory Visit: Payer: Self-pay

## 2023-05-07 DIAGNOSIS — Z79624 Long term (current) use of inhibitors of nucleotide synthesis: Secondary | ICD-10-CM | POA: Insufficient documentation

## 2023-05-07 DIAGNOSIS — J449 Chronic obstructive pulmonary disease, unspecified: Secondary | ICD-10-CM | POA: Insufficient documentation

## 2023-05-07 DIAGNOSIS — I129 Hypertensive chronic kidney disease with stage 1 through stage 4 chronic kidney disease, or unspecified chronic kidney disease: Secondary | ICD-10-CM | POA: Insufficient documentation

## 2023-05-07 DIAGNOSIS — Z21 Asymptomatic human immunodeficiency virus [HIV] infection status: Secondary | ICD-10-CM | POA: Insufficient documentation

## 2023-05-07 DIAGNOSIS — E1122 Type 2 diabetes mellitus with diabetic chronic kidney disease: Secondary | ICD-10-CM | POA: Insufficient documentation

## 2023-05-07 DIAGNOSIS — N183 Chronic kidney disease, stage 3 unspecified: Secondary | ICD-10-CM | POA: Insufficient documentation

## 2023-05-07 DIAGNOSIS — F0280 Dementia in other diseases classified elsewhere without behavioral disturbance: Secondary | ICD-10-CM | POA: Insufficient documentation

## 2023-05-07 DIAGNOSIS — Z8619 Personal history of other infectious and parasitic diseases: Secondary | ICD-10-CM | POA: Insufficient documentation

## 2023-05-07 DIAGNOSIS — E1140 Type 2 diabetes mellitus with diabetic neuropathy, unspecified: Secondary | ICD-10-CM | POA: Insufficient documentation

## 2023-05-07 DIAGNOSIS — C61 Malignant neoplasm of prostate: Secondary | ICD-10-CM | POA: Insufficient documentation

## 2023-05-07 DIAGNOSIS — Z510 Encounter for antineoplastic radiation therapy: Secondary | ICD-10-CM | POA: Insufficient documentation

## 2023-05-07 DIAGNOSIS — F0390 Unspecified dementia without behavioral disturbance: Secondary | ICD-10-CM | POA: Insufficient documentation

## 2023-05-07 DIAGNOSIS — F17210 Nicotine dependence, cigarettes, uncomplicated: Secondary | ICD-10-CM | POA: Insufficient documentation

## 2023-05-07 DIAGNOSIS — Z79899 Other long term (current) drug therapy: Secondary | ICD-10-CM | POA: Insufficient documentation

## 2023-05-07 LAB — RAD ONC ARIA SESSION SUMMARY
Course Elapsed Days: 8
Plan Fractions Treated to Date: 7
Plan Prescribed Dose Per Fraction: 2 Gy
Plan Total Fractions Prescribed: 40
Plan Total Prescribed Dose: 80 Gy
Reference Point Dosage Given to Date: 14 Gy
Reference Point Session Dosage Given: 2 Gy
Session Number: 7

## 2023-05-08 ENCOUNTER — Ambulatory Visit
Admission: RE | Admit: 2023-05-08 | Discharge: 2023-05-08 | Disposition: A | Discharge status: Home / Self Care | Source: Ambulatory Visit | Attending: Radiation Oncology | Admitting: Radiation Oncology

## 2023-05-08 ENCOUNTER — Other Ambulatory Visit: Payer: Self-pay

## 2023-05-08 DIAGNOSIS — Z510 Encounter for antineoplastic radiation therapy: Secondary | ICD-10-CM | POA: Diagnosis not present

## 2023-05-08 LAB — RAD ONC ARIA SESSION SUMMARY
Course Elapsed Days: 9
Plan Fractions Treated to Date: 8
Plan Prescribed Dose Per Fraction: 2 Gy
Plan Total Fractions Prescribed: 40
Plan Total Prescribed Dose: 80 Gy
Reference Point Dosage Given to Date: 16 Gy
Reference Point Session Dosage Given: 2 Gy
Session Number: 8

## 2023-05-09 ENCOUNTER — Ambulatory Visit: Admitting: Physician Assistant

## 2023-05-09 ENCOUNTER — Ambulatory Visit

## 2023-05-10 ENCOUNTER — Ambulatory Visit
Admission: RE | Admit: 2023-05-10 | Discharge: 2023-05-10 | Disposition: A | Discharge status: Home / Self Care | Source: Ambulatory Visit | Attending: Radiation Oncology | Admitting: Radiation Oncology

## 2023-05-10 ENCOUNTER — Other Ambulatory Visit: Payer: Self-pay

## 2023-05-10 DIAGNOSIS — Z510 Encounter for antineoplastic radiation therapy: Secondary | ICD-10-CM | POA: Diagnosis not present

## 2023-05-10 LAB — RAD ONC ARIA SESSION SUMMARY
Course Elapsed Days: 11
Plan Fractions Treated to Date: 9
Plan Prescribed Dose Per Fraction: 2 Gy
Plan Total Fractions Prescribed: 40
Plan Total Prescribed Dose: 80 Gy
Reference Point Dosage Given to Date: 18 Gy
Reference Point Session Dosage Given: 2 Gy
Session Number: 9

## 2023-05-13 ENCOUNTER — Other Ambulatory Visit: Payer: Self-pay

## 2023-05-13 ENCOUNTER — Ambulatory Visit
Admission: RE | Admit: 2023-05-13 | Discharge: 2023-05-13 | Disposition: A | Discharge status: Home / Self Care | Source: Ambulatory Visit | Attending: Radiation Oncology | Admitting: Radiation Oncology

## 2023-05-13 DIAGNOSIS — Z510 Encounter for antineoplastic radiation therapy: Secondary | ICD-10-CM | POA: Diagnosis not present

## 2023-05-13 LAB — RAD ONC ARIA SESSION SUMMARY
Course Elapsed Days: 14
Plan Fractions Treated to Date: 10
Plan Prescribed Dose Per Fraction: 2 Gy
Plan Total Fractions Prescribed: 40
Plan Total Prescribed Dose: 80 Gy
Reference Point Dosage Given to Date: 20 Gy
Reference Point Session Dosage Given: 2 Gy
Session Number: 10

## 2023-05-14 ENCOUNTER — Other Ambulatory Visit: Payer: Self-pay

## 2023-05-14 ENCOUNTER — Ambulatory Visit
Admission: RE | Admit: 2023-05-14 | Discharge: 2023-05-14 | Disposition: A | Discharge status: Home / Self Care | Source: Ambulatory Visit | Attending: Radiation Oncology | Admitting: Radiation Oncology

## 2023-05-14 DIAGNOSIS — Z510 Encounter for antineoplastic radiation therapy: Secondary | ICD-10-CM | POA: Diagnosis not present

## 2023-05-14 LAB — RAD ONC ARIA SESSION SUMMARY
Course Elapsed Days: 15
Plan Fractions Treated to Date: 11
Plan Prescribed Dose Per Fraction: 2 Gy
Plan Total Fractions Prescribed: 40
Plan Total Prescribed Dose: 80 Gy
Reference Point Dosage Given to Date: 22 Gy
Reference Point Session Dosage Given: 2 Gy
Session Number: 11

## 2023-05-15 ENCOUNTER — Ambulatory Visit
Admission: RE | Admit: 2023-05-15 | Discharge: 2023-05-15 | Disposition: A | Discharge status: Home / Self Care | Source: Ambulatory Visit | Attending: Radiation Oncology | Admitting: Radiation Oncology

## 2023-05-15 ENCOUNTER — Inpatient Hospital Stay

## 2023-05-15 ENCOUNTER — Other Ambulatory Visit: Payer: Self-pay

## 2023-05-15 DIAGNOSIS — C61 Malignant neoplasm of prostate: Secondary | ICD-10-CM | POA: Insufficient documentation

## 2023-05-15 DIAGNOSIS — Z510 Encounter for antineoplastic radiation therapy: Secondary | ICD-10-CM | POA: Diagnosis not present

## 2023-05-15 LAB — RAD ONC ARIA SESSION SUMMARY
Course Elapsed Days: 16
Plan Fractions Treated to Date: 12
Plan Prescribed Dose Per Fraction: 2 Gy
Plan Total Fractions Prescribed: 40
Plan Total Prescribed Dose: 80 Gy
Reference Point Dosage Given to Date: 24 Gy
Reference Point Session Dosage Given: 2 Gy
Session Number: 12

## 2023-05-15 LAB — CBC (CANCER CENTER ONLY)
HCT: 31.7 % — ABNORMAL LOW (ref 39.0–52.0)
Hemoglobin: 10.2 g/dL — ABNORMAL LOW (ref 13.0–17.0)
MCH: 30.8 pg (ref 26.0–34.0)
MCHC: 32.2 g/dL (ref 30.0–36.0)
MCV: 95.8 fL (ref 80.0–100.0)
Platelet Count: 194 10*3/uL (ref 150–400)
RBC: 3.31 MIL/uL — ABNORMAL LOW (ref 4.22–5.81)
RDW: 13.8 % (ref 11.5–15.5)
WBC Count: 9.3 10*3/uL (ref 4.0–10.5)
nRBC: 0 % (ref 0.0–0.2)

## 2023-05-16 ENCOUNTER — Ambulatory Visit
Admission: RE | Admit: 2023-05-16 | Discharge: 2023-05-16 | Discharge status: Home / Self Care | Source: Ambulatory Visit | Attending: Radiation Oncology | Admitting: Radiation Oncology

## 2023-05-16 ENCOUNTER — Other Ambulatory Visit: Payer: Self-pay

## 2023-05-16 DIAGNOSIS — Z510 Encounter for antineoplastic radiation therapy: Secondary | ICD-10-CM | POA: Diagnosis not present

## 2023-05-16 LAB — RAD ONC ARIA SESSION SUMMARY
Course Elapsed Days: 17
Plan Fractions Treated to Date: 13
Plan Prescribed Dose Per Fraction: 2 Gy
Plan Total Fractions Prescribed: 40
Plan Total Prescribed Dose: 80 Gy
Reference Point Dosage Given to Date: 26 Gy
Reference Point Session Dosage Given: 2 Gy
Session Number: 13

## 2023-05-17 ENCOUNTER — Ambulatory Visit (INDEPENDENT_AMBULATORY_CARE_PROVIDER_SITE_OTHER): Admitting: Physician Assistant

## 2023-05-17 ENCOUNTER — Ambulatory Visit
Admission: RE | Admit: 2023-05-17 | Discharge: 2023-05-17 | Disposition: A | Discharge status: Home / Self Care | Source: Ambulatory Visit | Attending: Radiation Oncology | Admitting: Radiation Oncology

## 2023-05-17 ENCOUNTER — Other Ambulatory Visit: Payer: Self-pay

## 2023-05-17 ENCOUNTER — Ambulatory Visit: Admitting: Physician Assistant

## 2023-05-17 DIAGNOSIS — Z466 Encounter for fitting and adjustment of urinary device: Secondary | ICD-10-CM

## 2023-05-17 DIAGNOSIS — R310 Gross hematuria: Secondary | ICD-10-CM

## 2023-05-17 DIAGNOSIS — Z510 Encounter for antineoplastic radiation therapy: Secondary | ICD-10-CM | POA: Diagnosis not present

## 2023-05-17 LAB — RAD ONC ARIA SESSION SUMMARY
Course Elapsed Days: 18
Plan Fractions Treated to Date: 14
Plan Prescribed Dose Per Fraction: 2 Gy
Plan Total Fractions Prescribed: 40
Plan Total Prescribed Dose: 80 Gy
Reference Point Dosage Given to Date: 28 Gy
Reference Point Session Dosage Given: 2 Gy
Session Number: 14

## 2023-05-17 MED ORDER — CEFDINIR 300 MG PO CAPS
300.0000 mg | ORAL_CAPSULE | Freq: Every day | ORAL | 0 refills | Status: AC
Start: 2023-05-17 — End: 2023-05-24

## 2023-05-17 NOTE — Progress Notes (Signed)
 05/17/2023 12:15 PM   Jay Flores 12-Nov-1960 [id number]  CC: Chief Complaint  Patient presents with   Urinary Retention   Hematuria   HPI: Jay Flores is a 63 y.o. male with PMH prostate cancer s/p gold seed placement with Dr. Lonna Cobb on 04/12/2023 on 6 months of Eligard and undergoing IMRT with Dr. Rushie Chestnut, BPH with urinary retention on finasteride managed with chronic Foley catheter, and a recent history of gross hematuria with acute blood loss anemia requiring admission and 1 unit PRBCs who presents today for scheduled Foley catheter change and with reports of gross hematuria.   Today he reports ongoing gross hematuria without abdominal pain, flank pain, or fevers.  He is unsure if the hematuria has improved or worsened throughout the week.  Hemoglobin 2 days ago was stable at 10.2.  PMH: Past Medical History:  Diagnosis Date   Acute renal failure (HCC)    Altered mental status 01/20/2012   Anemia    Brain lesion    Cholelithiasis    Chronic indwelling Foley catheter    CKD (chronic kidney disease) stage 3, GFR 30-59 ml/min (HCC)    COPD (chronic obstructive pulmonary disease) (HCC)    Diabetic neuropathy (HCC)    DKA (diabetic ketoacidosis) (HCC)    DM (diabetes mellitus), type 2 (HCC)    Elevated PSA    Gait instability    uses walker   H/O urinary retention    Hepatitis C infection    s/p Harvoni   History of cocaine use    last used in 2012   HIV (human immunodeficiency virus infection) (HCC)    HIV dementia (HCC)    Hypertension    IPMN (intraductal papillary mucinous neoplasm)    Lives in group home    [facility]   Metabolic acidosis    Pancreatic cyst    Pneumonia    Thrombocytopenia (HCC)    Tobacco use     Surgical History: Past Surgical History:  Procedure Laterality Date   COLONOSCOPY WITH PROPOFOL N/A 09/05/2022   Procedure: COLONOSCOPY WITH PROPOFOL;  Surgeon: Toledo, Boykin Nearing, MD;  Location: [facility]  ENDOSCOPY;  Service: Gastroenterology;  Laterality: N/A;   EUS N/A 04/19/2022   Procedure: UPPER ENDOSCOPIC ULTRASOUND (EUS) LINEAR;  Surgeon: Doren Custard, MD;  Location: [facility] ENDOSCOPY;  Service: Gastroenterology;  Laterality: N/A;  Requesting 1p slot   HEMOSTASIS CLIP PLACEMENT  09/05/2022   Procedure: HEMOSTASIS CLIP PLACEMENT;  Surgeon: Norma Fredrickson, Boykin Nearing, MD;  Location: [facility] ENDOSCOPY;  Service: Gastroenterology;;   POLYPECTOMY  09/05/2022   Procedure: POLYPECTOMY;  Surgeon: Norma Fredrickson, Boykin Nearing, MD;  Location: [facility] ENDOSCOPY;  Service: Gastroenterology;;   PROSTATE BIOPSY N/A 02/26/2023   Procedure: PROSTATE BIOPSY;  Surgeon: Riki Altes, MD;  Location: [facility] ORS;  Service: Urology;  Laterality: N/A;   TRANSRECTAL ULTRASOUND N/A 02/26/2023   Procedure: TRANSRECTAL ULTRASOUND;  Surgeon: Riki Altes, MD;  Location: [facility] ORS;  Service: Urology;  Laterality: N/A;    Home Medications:  Allergies as of 05/17/2023   No Known Allergies      Medication List        Accurate as of May 17, 2023 12:15 PM. If you have any questions, ask your nurse or doctor.          Accu-Chek Aviva Plus test strip Generic drug: glucose blood 1 each by Other route in the morning, at noon, and at bedtime. Use as instructed   Accu-Chek Softclix Lancets lancets by  Other route. Use as instructed   atorvastatin 20 MG tablet Commonly known as: LIPITOR Take 20 mg by mouth at bedtime.   cefdinir 300 MG capsule Commonly known as: OMNICEF Take 1 capsule (300 mg total) by mouth daily for 7 days. Started by: Carman Ching   cholecalciferol 25 MCG (1000 UNIT) tablet Commonly known as: VITAMIN D3 Take 1,000 Units by mouth at bedtime.   finasteride 5 MG tablet Commonly known as: PROSCAR Take 5 mg by mouth daily.   folic acid 1 MG tablet Commonly known as: FOLVITE Take 1 tablet (1 mg total) by mouth daily.   FreeStyle Libre 14 Day Reader Hardie Pulley Apply topically.   FreeStyle Libre 14  Day Sensor Misc Apply topically.   Jardiance 25 MG Tabs tablet Generic drug: empagliflozin Take 25 mg by mouth daily.   Juluca 50-25 MG tablet Generic drug: dolutegravir-rilpivirine Take 1 tablet by mouth daily.   losartan 100 MG tablet Commonly known as: COZAAR Take 100 mg by mouth daily.   metoprolol succinate 100 MG 24 hr tablet Commonly known as: TOPROL-XL Take 100 mg by mouth daily.   neomycin-bacitracin-polymyxin [id number] Oint Apply 1 Application topically daily.   nicotine 21 mg/24hr patch Commonly known as: NICODERM CQ - dosed in mg/24 hours Place 1 patch (21 mg total) onto the skin daily.   sodium bicarbonate 650 MG tablet Take 650 mg by mouth 2 (two) times daily.   Tylenol 8 Hour Arthritis Pain 650 MG CR tablet Generic drug: acetaminophen Take 650 mg by mouth every 4 (four) hours as needed for pain.        Allergies:  No Known Allergies  Family History: Family History  Problem Relation Age of Onset   Diabetes Mellitus II Mother     Social History:   reports that he has been smoking cigarettes. He has a 34 pack-year smoking history. He has been exposed to tobacco smoke. He has never used smokeless tobacco. He reports that he does not currently use drugs after having used the following drugs: Cocaine. He reports that he does not drink alcohol.  Physical Exam: There were no vitals taken for this visit.  Constitutional:  Alert and oriented, no acute distress, nontoxic appearing HEENT: [place], AT Cardiovascular: No clubbing, cyanosis, or edema Respiratory: Normal respiratory effort, no increased work of breathing GU: Foley catheter in place draining translucent, red urine with some clot fragments. Skin: No rashes, bruises or suspicious lesions Neurologic: Grossly intact, no focal deficits, moving all 4 extremities Psychiatric: Normal mood and affect  Cath Change/ Replacement  Patient is present today for a catheter change due to urinary retention.   8ml of water was removed from the balloon, a 18FR coud foley cath was removed without difficulty.  Patient was cleaned and prepped in a sterile fashion with betadine and 2% lidocaine jelly was instilled into the urethra. A 18 FR coud foley cath was replaced into the bladder, no complications were noted. Urine return was noted 60ml and urine was dark red-brown in color. The balloon was filled with 10ml of sterile water. A leg bag was attached for drainage.  Patient tolerated well.    Performed by: Carman Ching, PA-C   Bladder Irrigation  Due to gross hematuria with clot fragments patient is present today for a bladder irrigation. Patient was cleaned and prepped in a sterile fashion. 525 mL of sterile water was instilled into the bladder with a 70mL To[place]y syringe through the catheter in place.  of urine return was  cleared from the bladder with evacuation of 10ccs of clot material. Efflux cleared from dark red-brown to pink with the procedure and the catheter irrigated easily. Upon completion, the catheter was draining well and was reattached to the leg bag for drainage. Patient tolerated well.   Performed by: Carman Ching, PA-C and Annabell Sabal, CMA  Assessment & Plan:   1. Gross hematuria (Primary) Likely multifactorial in the setting of recent gold seed placement, IMRT for prostate cancer, and Foley catheter irritation.  With some clot fragments noted in his urine upon catheter exchange as above, I elected to irrigate his bladder.  Initial syringes appeared rather purulent, so I obtained a urine sample for culture and will start on empiric cefdinir.  Question possible cystitis as contributor as well.  Fortunately, blood counts have been stable.  Will continue to monitor. - CULTURE, URINE COMPREHENSIVE - cefdinir (OMNICEF) 300 MG capsule; Take 1 capsule (300 mg total) by mouth daily for 7 days.  Dispense: 7 capsule; Refill: 0  2. Urinary catheter (Foley) change  required Exchanged as above.  Return in about 4 weeks (around 06/14/2023) for Catheter exchange.  Carman Ching, PA-C  [facility] [place] [address], [address] [place], [place] [address] [phone]

## 2023-05-20 ENCOUNTER — Ambulatory Visit
Admission: RE | Admit: 2023-05-20 | Discharge: 2023-05-20 | Disposition: A | Discharge status: Home / Self Care | Source: Ambulatory Visit | Attending: Radiation Oncology | Admitting: Radiation Oncology

## 2023-05-20 ENCOUNTER — Telehealth: Payer: Self-pay | Admitting: *Deleted

## 2023-05-20 ENCOUNTER — Other Ambulatory Visit: Payer: Self-pay

## 2023-05-20 DIAGNOSIS — Z510 Encounter for antineoplastic radiation therapy: Secondary | ICD-10-CM | POA: Diagnosis not present

## 2023-05-20 LAB — RAD ONC ARIA SESSION SUMMARY
Course Elapsed Days: 21
Plan Fractions Treated to Date: 15
Plan Prescribed Dose Per Fraction: 2 Gy
Plan Total Fractions Prescribed: 40
Plan Total Prescribed Dose: 80 Gy
Reference Point Dosage Given to Date: 30 Gy
Reference Point Session Dosage Given: 2 Gy
Session Number: 15

## 2023-05-20 NOTE — Telephone Encounter (Signed)
 Contacted Care Home administrator regarding patient's personal hygiene. Reported that patient often presented to treatment with a soiled diaper and dirty stained clothing. Today patient had feces on his sweat pants. She assured Clinical research associate that she would speak with direct care staff and monitor his hygiene.

## 2023-05-21 ENCOUNTER — Other Ambulatory Visit: Payer: Self-pay

## 2023-05-21 ENCOUNTER — Ambulatory Visit
Admission: RE | Admit: 2023-05-21 | Discharge: 2023-05-21 | Disposition: A | Discharge status: Home / Self Care | Source: Ambulatory Visit | Attending: Radiation Oncology | Admitting: Radiation Oncology

## 2023-05-21 DIAGNOSIS — Z510 Encounter for antineoplastic radiation therapy: Secondary | ICD-10-CM | POA: Diagnosis not present

## 2023-05-21 LAB — RAD ONC ARIA SESSION SUMMARY
Course Elapsed Days: 22
Plan Fractions Treated to Date: 16
Plan Prescribed Dose Per Fraction: 2 Gy
Plan Total Fractions Prescribed: 40
Plan Total Prescribed Dose: 80 Gy
Reference Point Dosage Given to Date: 32 Gy
Reference Point Session Dosage Given: 2 Gy
Session Number: 16

## 2023-05-21 LAB — CULTURE, URINE COMPREHENSIVE

## 2023-05-22 ENCOUNTER — Other Ambulatory Visit: Payer: Self-pay

## 2023-05-22 ENCOUNTER — Ambulatory Visit
Admission: RE | Admit: 2023-05-22 | Discharge: 2023-05-22 | Disposition: A | Discharge status: Home / Self Care | Source: Ambulatory Visit | Attending: Radiation Oncology | Admitting: Radiation Oncology

## 2023-05-22 DIAGNOSIS — Z510 Encounter for antineoplastic radiation therapy: Secondary | ICD-10-CM | POA: Diagnosis not present

## 2023-05-22 LAB — RAD ONC ARIA SESSION SUMMARY
Course Elapsed Days: 23
Plan Fractions Treated to Date: 17
Plan Prescribed Dose Per Fraction: 2 Gy
Plan Total Fractions Prescribed: 40
Plan Total Prescribed Dose: 80 Gy
Reference Point Dosage Given to Date: 34 Gy
Reference Point Session Dosage Given: 2 Gy
Session Number: 17

## 2023-05-23 ENCOUNTER — Ambulatory Visit
Admission: RE | Admit: 2023-05-23 | Discharge: 2023-05-23 | Disposition: A | Discharge status: Home / Self Care | Source: Ambulatory Visit | Attending: Radiation Oncology | Admitting: Radiation Oncology

## 2023-05-23 ENCOUNTER — Other Ambulatory Visit: Payer: Self-pay

## 2023-05-23 DIAGNOSIS — Z510 Encounter for antineoplastic radiation therapy: Secondary | ICD-10-CM | POA: Diagnosis not present

## 2023-05-23 LAB — RAD ONC ARIA SESSION SUMMARY
Course Elapsed Days: 24
Plan Fractions Treated to Date: 18
Plan Prescribed Dose Per Fraction: 2 Gy
Plan Total Fractions Prescribed: 40
Plan Total Prescribed Dose: 80 Gy
Reference Point Dosage Given to Date: 36 Gy
Reference Point Session Dosage Given: 2 Gy
Session Number: 18

## 2023-05-24 ENCOUNTER — Other Ambulatory Visit: Payer: Self-pay

## 2023-05-24 ENCOUNTER — Ambulatory Visit
Admission: RE | Admit: 2023-05-24 | Discharge: 2023-05-24 | Disposition: A | Discharge status: Home / Self Care | Source: Ambulatory Visit | Attending: Radiation Oncology | Admitting: Radiation Oncology

## 2023-05-24 DIAGNOSIS — Z510 Encounter for antineoplastic radiation therapy: Secondary | ICD-10-CM | POA: Diagnosis not present

## 2023-05-24 LAB — RAD ONC ARIA SESSION SUMMARY
Course Elapsed Days: 25
Plan Fractions Treated to Date: 19
Plan Prescribed Dose Per Fraction: 2 Gy
Plan Total Fractions Prescribed: 40
Plan Total Prescribed Dose: 80 Gy
Reference Point Dosage Given to Date: 38 Gy
Reference Point Session Dosage Given: 2 Gy
Session Number: 19

## 2023-05-27 ENCOUNTER — Telehealth: Payer: Self-pay | Admitting: *Deleted

## 2023-05-27 ENCOUNTER — Ambulatory Visit

## 2023-05-27 ENCOUNTER — Ambulatory Visit: Admission: RE | Admit: 2023-05-27 | Source: Ambulatory Visit

## 2023-05-27 NOTE — Telephone Encounter (Signed)
 Notified Administrator of his care home that a bed bug was found in he under clothing this Am/ Advised her that we had to have proof of extermination before patient could return of treatment. She verbalized understanding.

## 2023-05-27 NOTE — Telephone Encounter (Signed)
 error

## 2023-05-28 ENCOUNTER — Ambulatory Visit

## 2023-05-28 ENCOUNTER — Other Ambulatory Visit: Payer: Self-pay

## 2023-05-28 ENCOUNTER — Ambulatory Visit
Admission: RE | Admit: 2023-05-28 | Discharge: 2023-05-28 | Disposition: A | Discharge status: Home / Self Care | Source: Ambulatory Visit | Attending: Radiation Oncology | Admitting: Radiation Oncology

## 2023-05-28 DIAGNOSIS — Z510 Encounter for antineoplastic radiation therapy: Secondary | ICD-10-CM | POA: Diagnosis not present

## 2023-05-28 LAB — RAD ONC ARIA SESSION SUMMARY
Course Elapsed Days: 29
Plan Fractions Treated to Date: 20
Plan Prescribed Dose Per Fraction: 2 Gy
Plan Total Fractions Prescribed: 40
Plan Total Prescribed Dose: 80 Gy
Reference Point Dosage Given to Date: 40 Gy
Reference Point Session Dosage Given: 2 Gy
Session Number: 20

## 2023-05-29 ENCOUNTER — Other Ambulatory Visit: Payer: Self-pay

## 2023-05-29 ENCOUNTER — Inpatient Hospital Stay

## 2023-05-29 ENCOUNTER — Ambulatory Visit
Admission: RE | Admit: 2023-05-29 | Discharge: 2023-05-29 | Disposition: A | Discharge status: Home / Self Care | Source: Ambulatory Visit | Attending: Radiation Oncology | Admitting: Radiation Oncology

## 2023-05-29 DIAGNOSIS — Z510 Encounter for antineoplastic radiation therapy: Secondary | ICD-10-CM | POA: Diagnosis not present

## 2023-05-29 LAB — RAD ONC ARIA SESSION SUMMARY
Course Elapsed Days: 30
Plan Fractions Treated to Date: 1
Plan Prescribed Dose Per Fraction: 2.77 Gy
Plan Total Fractions Prescribed: 12
Plan Total Prescribed Dose: 33.24 Gy
Reference Point Dosage Given to Date: 42.77 Gy
Reference Point Session Dosage Given: 2.77 Gy
Session Number: 21

## 2023-05-30 ENCOUNTER — Ambulatory Visit
Admission: RE | Admit: 2023-05-30 | Discharge: 2023-05-30 | Disposition: A | Discharge status: Home / Self Care | Source: Ambulatory Visit | Attending: Radiation Oncology | Admitting: Radiation Oncology

## 2023-05-30 ENCOUNTER — Other Ambulatory Visit: Payer: Self-pay

## 2023-05-30 ENCOUNTER — Encounter: Payer: Self-pay | Admitting: *Deleted

## 2023-05-30 ENCOUNTER — Inpatient Hospital Stay

## 2023-05-30 DIAGNOSIS — Z510 Encounter for antineoplastic radiation therapy: Secondary | ICD-10-CM | POA: Diagnosis not present

## 2023-05-30 DIAGNOSIS — C61 Malignant neoplasm of prostate: Secondary | ICD-10-CM

## 2023-05-30 LAB — CBC (CANCER CENTER ONLY)
HCT: 29.1 % — ABNORMAL LOW (ref 39.0–52.0)
Hemoglobin: 9.3 g/dL — ABNORMAL LOW (ref 13.0–17.0)
MCH: 30.9 pg (ref 26.0–34.0)
MCHC: 32 g/dL (ref 30.0–36.0)
MCV: 96.7 fL (ref 80.0–100.0)
Platelet Count: 174 10*3/uL (ref 150–400)
RBC: 3.01 MIL/uL — ABNORMAL LOW (ref 4.22–5.81)
RDW: 13.6 % (ref 11.5–15.5)
WBC Count: 6.7 10*3/uL (ref 4.0–10.5)
nRBC: 0 % (ref 0.0–0.2)

## 2023-05-30 LAB — RAD ONC ARIA SESSION SUMMARY
Course Elapsed Days: 31
Plan Fractions Treated to Date: 2
Plan Prescribed Dose Per Fraction: 2.77 Gy
Plan Total Fractions Prescribed: 12
Plan Total Prescribed Dose: 33.24 Gy
Reference Point Dosage Given to Date: 45.54 Gy
Reference Point Session Dosage Given: 2.77 Gy
Session Number: 22
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
## 2023-05-31 ENCOUNTER — Other Ambulatory Visit: Payer: Self-pay

## 2023-05-31 ENCOUNTER — Ambulatory Visit
Admission: RE | Admit: 2023-05-31 | Discharge: 2023-05-31 | Disposition: A | Discharge status: Home / Self Care | Source: Ambulatory Visit | Attending: Radiation Oncology | Admitting: Radiation Oncology

## 2023-05-31 DIAGNOSIS — Z510 Encounter for antineoplastic radiation therapy: Secondary | ICD-10-CM | POA: Diagnosis not present

## 2023-05-31 LAB — RAD ONC ARIA SESSION SUMMARY
Course Elapsed Days: 32
Plan Fractions Treated to Date: 3
Plan Prescribed Dose Per Fraction: 2.77 Gy
Plan Total Fractions Prescribed: 12
Plan Total Prescribed Dose: 33.24 Gy
Reference Point Dosage Given to Date: 48.31 Gy
Reference Point Session Dosage Given: 2.77 Gy
Session Number: 23

## 2023-06-03 ENCOUNTER — Ambulatory Visit
Admission: RE | Admit: 2023-06-03 | Discharge: 2023-06-03 | Disposition: A | Discharge status: Home / Self Care | Source: Ambulatory Visit | Attending: Radiation Oncology | Admitting: Radiation Oncology

## 2023-06-03 ENCOUNTER — Other Ambulatory Visit: Payer: Self-pay

## 2023-06-03 DIAGNOSIS — Z510 Encounter for antineoplastic radiation therapy: Secondary | ICD-10-CM | POA: Diagnosis not present

## 2023-06-03 LAB — RAD ONC ARIA SESSION SUMMARY
Course Elapsed Days: 35
Plan Fractions Treated to Date: 4
Plan Prescribed Dose Per Fraction: 2.77 Gy
Plan Total Fractions Prescribed: 12
Plan Total Prescribed Dose: 33.24 Gy
Reference Point Dosage Given to Date: 51.08 Gy
Reference Point Session Dosage Given: 2.77 Gy
Session Number: 24

## 2023-06-04 ENCOUNTER — Other Ambulatory Visit: Payer: Self-pay

## 2023-06-04 ENCOUNTER — Ambulatory Visit
Admission: RE | Admit: 2023-06-04 | Discharge: 2023-06-04 | Disposition: A | Discharge status: Home / Self Care | Source: Ambulatory Visit | Attending: Radiation Oncology | Admitting: Radiation Oncology

## 2023-06-04 DIAGNOSIS — Z510 Encounter for antineoplastic radiation therapy: Secondary | ICD-10-CM | POA: Diagnosis not present

## 2023-06-04 LAB — RAD ONC ARIA SESSION SUMMARY
Course Elapsed Days: 36
Plan Fractions Treated to Date: 5
Plan Prescribed Dose Per Fraction: 2.77 Gy
Plan Total Fractions Prescribed: 12
Plan Total Prescribed Dose: 33.24 Gy
Reference Point Dosage Given to Date: 53.85 Gy
Reference Point Session Dosage Given: 2.77 Gy
Session Number: 25

## 2023-06-05 ENCOUNTER — Other Ambulatory Visit: Payer: Self-pay

## 2023-06-05 ENCOUNTER — Ambulatory Visit
Admission: RE | Admit: 2023-06-05 | Discharge: 2023-06-05 | Disposition: A | Discharge status: Home / Self Care | Source: Ambulatory Visit | Attending: Radiation Oncology | Admitting: Radiation Oncology

## 2023-06-05 DIAGNOSIS — Z510 Encounter for antineoplastic radiation therapy: Secondary | ICD-10-CM | POA: Diagnosis not present

## 2023-06-05 LAB — RAD ONC ARIA SESSION SUMMARY
Course Elapsed Days: 37
Plan Fractions Treated to Date: 6
Plan Prescribed Dose Per Fraction: 2.77 Gy
Plan Total Fractions Prescribed: 12
Plan Total Prescribed Dose: 33.24 Gy
Reference Point Dosage Given to Date: 56.62 Gy
Reference Point Session Dosage Given: 2.77 Gy
Session Number: 26

## 2023-06-06 ENCOUNTER — Other Ambulatory Visit: Payer: Self-pay

## 2023-06-06 ENCOUNTER — Ambulatory Visit
Admission: RE | Admit: 2023-06-06 | Discharge: 2023-06-06 | Disposition: A | Discharge status: Home / Self Care | Source: Ambulatory Visit | Attending: Radiation Oncology | Admitting: Radiation Oncology

## 2023-06-06 DIAGNOSIS — Z510 Encounter for antineoplastic radiation therapy: Secondary | ICD-10-CM | POA: Insufficient documentation

## 2023-06-06 DIAGNOSIS — C61 Malignant neoplasm of prostate: Secondary | ICD-10-CM | POA: Diagnosis present

## 2023-06-06 LAB — RAD ONC ARIA SESSION SUMMARY
Course Elapsed Days: 38
Plan Fractions Treated to Date: 7
Plan Prescribed Dose Per Fraction: 2.77 Gy
Plan Total Fractions Prescribed: 12
Plan Total Prescribed Dose: 33.24 Gy
Reference Point Dosage Given to Date: 59.39 Gy
Reference Point Session Dosage Given: 2.77 Gy
Session Number: 27

## 2023-06-07 ENCOUNTER — Ambulatory Visit
Admission: RE | Admit: 2023-06-07 | Discharge: 2023-06-07 | Disposition: A | Discharge status: Home / Self Care | Source: Ambulatory Visit | Attending: Radiation Oncology | Admitting: Radiation Oncology

## 2023-06-07 ENCOUNTER — Other Ambulatory Visit: Payer: Self-pay

## 2023-06-07 DIAGNOSIS — Z510 Encounter for antineoplastic radiation therapy: Secondary | ICD-10-CM | POA: Diagnosis not present

## 2023-06-07 LAB — RAD ONC ARIA SESSION SUMMARY
Course Elapsed Days: 39
Plan Fractions Treated to Date: 8
Plan Prescribed Dose Per Fraction: 2.77 Gy
Plan Total Fractions Prescribed: 12
Plan Total Prescribed Dose: 33.24 Gy
Reference Point Dosage Given to Date: 62.16 Gy
Reference Point Session Dosage Given: 2.77 Gy
Session Number: 28

## 2023-06-10 ENCOUNTER — Ambulatory Visit
Admission: RE | Admit: 2023-06-10 | Discharge: 2023-06-10 | Disposition: A | Discharge status: Home / Self Care | Source: Ambulatory Visit | Attending: Radiation Oncology | Admitting: Radiation Oncology

## 2023-06-10 ENCOUNTER — Other Ambulatory Visit: Payer: Self-pay

## 2023-06-10 DIAGNOSIS — Z510 Encounter for antineoplastic radiation therapy: Secondary | ICD-10-CM | POA: Diagnosis not present

## 2023-06-10 LAB — RAD ONC ARIA SESSION SUMMARY
Course Elapsed Days: 42
Plan Fractions Treated to Date: 9
Plan Prescribed Dose Per Fraction: 2.77 Gy
Plan Total Fractions Prescribed: 12
Plan Total Prescribed Dose: 33.24 Gy
Reference Point Dosage Given to Date: 64.93 Gy
Reference Point Session Dosage Given: 2.77 Gy
Session Number: 29

## 2023-06-11 ENCOUNTER — Ambulatory Visit
Admission: RE | Admit: 2023-06-11 | Discharge: 2023-06-11 | Disposition: A | Discharge status: Home / Self Care | Source: Ambulatory Visit | Attending: Radiation Oncology | Admitting: Radiation Oncology

## 2023-06-11 ENCOUNTER — Other Ambulatory Visit: Payer: Self-pay

## 2023-06-11 DIAGNOSIS — Z510 Encounter for antineoplastic radiation therapy: Secondary | ICD-10-CM | POA: Diagnosis not present

## 2023-06-11 LAB — RAD ONC ARIA SESSION SUMMARY
Course Elapsed Days: 43
Plan Fractions Treated to Date: 10
Plan Prescribed Dose Per Fraction: 2.77 Gy
Plan Total Fractions Prescribed: 12
Plan Total Prescribed Dose: 33.24 Gy
Reference Point Dosage Given to Date: 67.7 Gy
Reference Point Session Dosage Given: 2.77 Gy
Session Number: 30

## 2023-06-12 ENCOUNTER — Ambulatory Visit
Admission: RE | Admit: 2023-06-12 | Discharge: 2023-06-12 | Disposition: A | Discharge status: Home / Self Care | Source: Ambulatory Visit | Attending: Radiation Oncology | Admitting: Radiation Oncology

## 2023-06-12 ENCOUNTER — Other Ambulatory Visit: Payer: Self-pay

## 2023-06-12 ENCOUNTER — Inpatient Hospital Stay: Attending: Radiation Oncology

## 2023-06-12 DIAGNOSIS — Z510 Encounter for antineoplastic radiation therapy: Secondary | ICD-10-CM | POA: Diagnosis not present

## 2023-06-12 LAB — RAD ONC ARIA SESSION SUMMARY
Course Elapsed Days: 44
Plan Fractions Treated to Date: 11
Plan Prescribed Dose Per Fraction: 2.77 Gy
Plan Total Fractions Prescribed: 12
Plan Total Prescribed Dose: 33.24 Gy
Reference Point Dosage Given to Date: 70.47 Gy
Reference Point Session Dosage Given: 2.77 Gy
Session Number: 31

## 2023-06-13 ENCOUNTER — Ambulatory Visit

## 2023-06-13 ENCOUNTER — Other Ambulatory Visit: Payer: Self-pay

## 2023-06-13 ENCOUNTER — Ambulatory Visit
Admission: RE | Admit: 2023-06-13 | Discharge: 2023-06-13 | Disposition: A | Discharge status: Home / Self Care | Source: Ambulatory Visit | Attending: Radiation Oncology | Admitting: Radiation Oncology

## 2023-06-13 DIAGNOSIS — Z510 Encounter for antineoplastic radiation therapy: Secondary | ICD-10-CM | POA: Diagnosis not present

## 2023-06-13 LAB — RAD ONC ARIA SESSION SUMMARY
Course Elapsed Days: 45
Plan Fractions Treated to Date: 12
Plan Prescribed Dose Per Fraction: 2.77 Gy
Plan Total Fractions Prescribed: 12
Plan Total Prescribed Dose: 33.24 Gy
Reference Point Dosage Given to Date: 73.24 Gy
Reference Point Session Dosage Given: 2.77 Gy
Session Number: 32

## 2023-06-14 ENCOUNTER — Ambulatory Visit

## 2023-06-14 NOTE — Radiation Completion Notes (Signed)
 Patient Name: UNIQUE, DARTY MRN: [id number] Date of Birth: 1960-11-22 Referring Physician: Emi Hanson, M.D. Date of Service: 2023-06-14 Radiation Oncologist: Glenis Langdon, M.D. 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  MRI scan showed no transscapular spread although there was an area of 1.5 cm capsular contact.  No evidence of metastatic disease in bone or pelvic lymph nodes or seminal vesicle involvement.8 range he did have a PI-RADS category 5 lesion in the right peripheral zone and targeting data was sent to [place].  Pathology was positive for 5 of 12 cores positive for combination of Gleason 7 (3+4) and Gleason 7 (4+3).  Patient does have a Foley catheter placed.  He is in no pain.  He has a bone scan scheduled for tomorrow.      ==========DELIVERED PLANS==========  First Treatment Date: 2023-04-29 Last Treatment Date: 2023-06-13   Plan Name: Prostate Site: Prostate Technique: IMRT Mode: Photon Dose Per Fraction: 2 Gy Prescribed Dose (Delivered / Prescribed): 40 Gy / 40 Gy Prescribed Fxs (Delivered / Prescribed): 20 / 20   Plan Name: Prostate:1 Site: Prostate Technique: IMRT Mode: Photon Dose Per Fraction: 2.77 Gy Prescribed Dose (Delivered / Prescribed): 33.24 Gy / 33.24 Gy Prescribed Fxs (Delivered / Prescribed): 12 / 12     ==========ON TREATMENT VISIT DATES========== 2023-04-30, 2023-05-07, 2023-05-14, 2023-05-21, 2023-05-28, 2023-06-04, 2023-06-11     ==========UPCOMING VISITS==========       ==========APPENDIX - ON TREATMENT VISIT NOTES==========   See  weekly On Treatment Notes in Epic for details in the Media tab (listed as Progress notes on the On Treatment Visit Dates listed above).

## 2023-06-17 ENCOUNTER — Ambulatory Visit

## 2023-06-18 ENCOUNTER — Ambulatory Visit

## 2023-06-19 ENCOUNTER — Ambulatory Visit

## 2023-06-19 ENCOUNTER — Encounter

## 2023-06-20 ENCOUNTER — Encounter

## 2023-06-21 ENCOUNTER — Emergency Department
Admission: EM | Admit: 2023-06-21 | Discharge: 2023-06-21 | Disposition: A | Discharge status: Home / Self Care | Attending: Emergency Medicine | Admitting: Emergency Medicine

## 2023-06-21 ENCOUNTER — Other Ambulatory Visit: Payer: Self-pay

## 2023-06-21 ENCOUNTER — Encounter

## 2023-06-21 ENCOUNTER — Telehealth: Payer: Self-pay | Admitting: Emergency Medicine

## 2023-06-21 ENCOUNTER — Encounter: Admitting: Physician Assistant

## 2023-06-21 ENCOUNTER — Ambulatory Visit

## 2023-06-21 DIAGNOSIS — N189 Chronic kidney disease, unspecified: Secondary | ICD-10-CM | POA: Diagnosis not present

## 2023-06-21 DIAGNOSIS — Z21 Asymptomatic human immunodeficiency virus [HIV] infection status: Secondary | ICD-10-CM | POA: Insufficient documentation

## 2023-06-21 DIAGNOSIS — T839XXA Unspecified complication of genitourinary prosthetic device, implant and graft, initial encounter: Secondary | ICD-10-CM

## 2023-06-21 DIAGNOSIS — T83098A Other mechanical complication of other indwelling urethral catheter, initial encounter: Secondary | ICD-10-CM | POA: Diagnosis present

## 2023-06-21 DIAGNOSIS — E119 Type 2 diabetes mellitus without complications: Secondary | ICD-10-CM | POA: Insufficient documentation

## 2023-06-21 DIAGNOSIS — I129 Hypertensive chronic kidney disease with stage 1 through stage 4 chronic kidney disease, or unspecified chronic kidney disease: Secondary | ICD-10-CM | POA: Insufficient documentation

## 2023-06-21 DIAGNOSIS — N4889 Other specified disorders of penis: Secondary | ICD-10-CM | POA: Insufficient documentation

## 2023-06-21 DIAGNOSIS — R82998 Other abnormal findings in urine: Secondary | ICD-10-CM | POA: Insufficient documentation

## 2023-06-21 DIAGNOSIS — Z8546 Personal history of malignant neoplasm of prostate: Secondary | ICD-10-CM | POA: Diagnosis not present

## 2023-06-21 LAB — BASIC METABOLIC PANEL WITH GFR
Anion gap: 14 (ref 5–15)
BUN: 32 mg/dL — ABNORMAL HIGH (ref 8–23)
CO2: 18 mmol/L — ABNORMAL LOW (ref 22–32)
Calcium: 9.3 mg/dL (ref 8.9–10.3)
Chloride: 106 mmol/L (ref 98–111)
Creatinine, Ser: 2.92 mg/dL — ABNORMAL HIGH (ref 0.61–1.24)
GFR, Estimated: 24 mL/min — ABNORMAL LOW (ref >60)
Glucose, Bld: 174 mg/dL — ABNORMAL HIGH (ref 70–99)
Potassium: 4.5 mmol/L (ref 3.5–5.1)
Sodium: 138 mmol/L (ref 135–145)

## 2023-06-21 LAB — CBC WITH DIFFERENTIAL/PLATELET
Abs Immature Granulocytes: 0.04 10*3/uL (ref 0.00–0.07)
Basophils Absolute: 0 10*3/uL (ref 0.0–0.1)
Basophils Relative: 0 %
Eosinophils Absolute: 0 10*3/uL (ref 0.0–0.5)
Eosinophils Relative: 0 %
HCT: 32 % — ABNORMAL LOW (ref 39.0–52.0)
Hemoglobin: 9.9 g/dL — ABNORMAL LOW (ref 13.0–17.0)
Immature Granulocytes: 1 %
Lymphocytes Relative: 5 %
Lymphs Abs: 0.4 10*3/uL — ABNORMAL LOW (ref 0.7–4.0)
MCH: 29.6 pg (ref 26.0–34.0)
MCHC: 30.9 g/dL (ref 30.0–36.0)
MCV: 95.5 fL (ref 80.0–100.0)
Monocytes Absolute: 0 10*3/uL — ABNORMAL LOW (ref 0.1–1.0)
Monocytes Relative: 1 %
Neutro Abs: 7.4 10*3/uL (ref 1.7–7.7)
Neutrophils Relative %: 93 %
Platelets: 165 10*3/uL (ref 150–400)
RBC: 3.35 MIL/uL — ABNORMAL LOW (ref 4.22–5.81)
RDW: 13.2 % (ref 11.5–15.5)
WBC: 7.8 10*3/uL (ref 4.0–10.5)
nRBC: 0 % (ref 0.0–0.2)

## 2023-06-21 LAB — URINALYSIS, ROUTINE W REFLEX MICROSCOPIC
Bilirubin Urine: NEGATIVE
Glucose, UA: >=500 mg/dL — AB
Ketones, ur: NEGATIVE mg/dL
Nitrite: NEGATIVE
Protein, ur: >=300 mg/dL — AB
RBC / HPF: >50 RBC/hpf (ref 0–5)
Specific Gravity, Urine: 1.012 (ref 1.005–1.030)
Squamous Epithelial / HPF: 0 /HPF (ref 0–5)
WBC, UA: >50 WBC/hpf (ref 0–5)
pH: 5 (ref 5.0–8.0)

## 2023-06-21 MED ORDER — HYDROMORPHONE HCL 1 MG/ML IJ SOLN
0.5000 mg | Freq: Once | INTRAMUSCULAR | Status: AC
  Administered 2023-06-21: 0.5 mg via INTRAVENOUS
  Filled 2023-06-21: qty 0.5

## 2023-06-21 MED ORDER — CEPHALEXIN 500 MG PO CAPS
500.0000 mg | ORAL_CAPSULE | Freq: Once | ORAL | Status: AC
  Administered 2023-06-21: 500 mg via ORAL
  Filled 2023-06-21: qty 1

## 2023-06-21 MED ORDER — CEPHALEXIN 250 MG PO CAPS: 250.0000 mg | ORAL_CAPSULE | Freq: Two times a day (BID) | ORAL | 0 refills | Status: DC

## 2023-06-21 MED ORDER — LIDOCAINE HCL URETHRAL/MUCOSAL 2 % EX GEL
1.0000 | Freq: Once | CUTANEOUS | Status: DC
  Filled 2023-06-21: qty 10

## 2023-06-21 MED ORDER — CEPHALEXIN 500 MG PO CAPS: 500.0000 mg | ORAL_CAPSULE | Freq: Four times a day (QID) | ORAL | 0 refills | Status: DC

## 2023-06-21 MED ORDER — CEPHALEXIN 500 MG PO CAPS: 500.0000 mg | ORAL_CAPSULE | Freq: Three times a day (TID) | ORAL | 0 refills | Status: DC

## 2023-06-21 MED ORDER — CEPHALEXIN 250 MG PO CAPS: 250.0000 mg | ORAL_CAPSULE | Freq: Two times a day (BID) | ORAL | 0 refills | Status: AC

## 2023-06-21 MED ORDER — CEPHALEXIN 500 MG PO CAPS
500.0000 mg | ORAL_CAPSULE | Freq: Two times a day (BID) | ORAL | 0 refills | Status: DC
Start: 2023-06-21 — End: 2023-06-21

## 2023-06-21 NOTE — Telephone Encounter (Signed)
 Rx resent.

## 2023-06-21 NOTE — ED Notes (Signed)
 Called report to [facility] and spoke to [place], [place] and she advised she would arrange for transport to come and pick pt up from the hospital to return him back to the facility. [phone]

## 2023-06-21 NOTE — Consult Note (Signed)
 Urology Consult   Reason for consult: foley problem  History of Present Illness: Jay Flores is a 63 y.o. M with hx of urinary retention managed with indwelling foley. Also hx of prostate cancer s/p radiation earlier this year.   He presented to the emergency department complaining of pain in the catheter was found to be dislodged.  Emergency department staff was not able to deflate the balloon and remove the catheter. The balloon would not deflate -aspiration was attempted from the balloon port and the end of the Foley was cut off.  Past Medical History:  Diagnosis Date   Acute renal failure (HCC)    Altered mental status 01/20/2012   Anemia    Brain lesion    Cholelithiasis    Chronic indwelling Foley catheter    CKD (chronic kidney disease) stage 3, GFR 30-59 ml/min (HCC)    COPD (chronic obstructive pulmonary disease) (HCC)    Diabetic neuropathy (HCC)    DKA (diabetic ketoacidosis) (HCC)    DM (diabetes mellitus), type 2 (HCC)    Elevated PSA    Gait instability    uses walker   H/O urinary retention    Hepatitis C infection    s/p Harvoni   History of cocaine use    last used in 2012   HIV (human immunodeficiency virus infection) (HCC)    HIV dementia (HCC)    Hypertension    IPMN (intraductal papillary mucinous neoplasm)    Lives in group home    [facility]   Metabolic acidosis    Pancreatic cyst    Pneumonia    Thrombocytopenia (HCC)    Tobacco use     Past Surgical History:  Procedure Laterality Date   COLONOSCOPY WITH PROPOFOL  N/A 09/05/2022   Procedure: COLONOSCOPY WITH PROPOFOL ;  Surgeon: Toledo, Alphonsus Jeans, MD;  Location: [facility] ENDOSCOPY;  Service: Gastroenterology;  Laterality: N/A;   EUS N/A 04/19/2022   Procedure: UPPER ENDOSCOPIC ULTRASOUND (EUS) LINEAR;  Surgeon: Rayford Cake, MD;  Location: [facility] ENDOSCOPY;  Service: Gastroenterology;  Laterality: N/A;  Requesting 1p slot   HEMOSTASIS CLIP PLACEMENT  09/05/2022    Procedure: HEMOSTASIS CLIP PLACEMENT;  Surgeon: Corky Diener, Alphonsus Jeans, MD;  Location: [facility] ENDOSCOPY;  Service: Gastroenterology;;   POLYPECTOMY  09/05/2022   Procedure: POLYPECTOMY;  Surgeon: Corky Diener, Alphonsus Jeans, MD;  Location: [facility] ENDOSCOPY;  Service: Gastroenterology;;   PROSTATE BIOPSY N/A 02/26/2023   Procedure: PROSTATE BIOPSY;  Surgeon: Geraline Knapp, MD;  Location: [facility] ORS;  Service: Urology;  Laterality: N/A;   TRANSRECTAL ULTRASOUND N/A 02/26/2023   Procedure: TRANSRECTAL ULTRASOUND;  Surgeon: Geraline Knapp, MD;  Location: [facility] ORS;  Service: Urology;  Laterality: N/A;    Current Hospital Medications:  Home Meds:  No current facility-administered medications on file prior to encounter.   Current Outpatient Medications on File Prior to Encounter  Medication Sig Dispense Refill   Accu-Chek Softclix Lancets lancets by Other route. Use as instructed     acetaminophen  (TYLENOL  8 HOUR ARTHRITIS PAIN) 650 MG CR tablet Take 650 mg by mouth every 4 (four) hours as needed for pain.     atorvastatin  (LIPITOR) 20 MG tablet Take 20 mg by mouth at bedtime.     cholecalciferol (VITAMIN D3) 25 MCG (1000 UNIT) tablet Take 1,000 Units by mouth at bedtime.     Continuous Blood Gluc Receiver (FREESTYLE LIBRE 14 DAY READER) DEVI Apply topically.     Continuous Blood Gluc Sensor (FREESTYLE LIBRE 14 DAY SENSOR)  MISC Apply topically.     Dolutegravir -Rilpivirine  (JULUCA ) 50-25 MG TABS Take 1 tablet by mouth daily.     finasteride  (PROSCAR ) 5 MG tablet Take 5 mg by mouth daily.     folic acid  (FOLVITE ) 1 MG tablet Take 1 tablet (1 mg total) by mouth daily. 90 tablet 1   glucose blood (ACCU-CHEK AVIVA PLUS) test strip 1 each by Other route in the morning, at noon, and at bedtime. Use as instructed     JARDIANCE 25 MG TABS tablet Take 25 mg by mouth daily.     losartan  (COZAAR ) 100 MG tablet Take 100 mg by mouth daily.     metoprolol succinate (TOPROL-XL) 100 MG 24 hr tablet Take 100 mg by mouth daily.      neomycin-bacitracin-polymyxin [id number] OINT Apply 1 Application topically daily. 30 g 1   nicotine  (NICODERM CQ  - DOSED IN MG/24 HOURS) 21 mg/24hr patch Place 1 patch (21 mg total) onto the skin daily. 28 patch 0   sodium bicarbonate  650 MG tablet Take 650 mg by mouth 2 (two) times daily.       Scheduled Meds:  lidocaine   1 Application Urethral Once   Continuous Infusions: PRN Meds:.  Allergies: No Known Allergies  Family History  Problem Relation Age of Onset   Diabetes Mellitus II Mother     Social History:  reports that he has been smoking cigarettes. He has a 34 pack-year smoking history. He has been exposed to tobacco smoke. He has never used smokeless tobacco. He reports that he does not currently use drugs after having used the following drugs: Cocaine. He reports that he does not drink alcohol.  ROS: A complete review of systems was performed.  All systems are negative except for pertinent findings as noted.  Physical Exam:  Vital signs in last 24 hours: Temp:  [98.7 F (37.1 C)] 98.7 F (37.1 C) (05/16 1030) Pulse Rate:  [87-103] 89 (05/16 1030) Resp:  [18] 18 (05/16 1030) BP: (113-157)/(74-83) 113/74 (05/16 1030) SpO2:  [97 %-100 %] 98 % (05/16 1030) Weight:  [61 kg] 61 kg (05/16 0820) Constitutional:  Alert and oriented, No acute distress Cardiovascular: Regular rate and rhythm Respiratory: Normal respiratory effort, Lungs clear bilaterally GI: Abdomen is soft, nontender, nondistended, no abdominal masses GU: Foley in place with end cut off.  There is some blood at the meatus.  I can palpate the balloon in the penile urethra.  Neurologic: Grossly intact, no focal deficits Psychiatric: Normal mood and affect  Laboratory Data:  Recent Labs    06/21/23 0923  WBC 7.8  HGB 9.9*  HCT 32.0*  PLT 165    Recent Labs    06/21/23 0923  NA 138  K 4.5  CL 106  GLUCOSE 174*  BUN 32*  CALCIUM  9.3  CREATININE 2.92*     Results for orders placed or  performed during the hospital encounter of 06/21/23 (from the past 24 hours)  CBC with Differential/Platelet     Status: Abnormal   Collection Time: 06/21/23  9:23 AM  Result Value Ref Range   WBC 7.8 4.0 - 10.5 K/uL   RBC 3.35 (L) 4.22 - 5.81 MIL/uL   Hemoglobin 9.9 (L) 13.0 - 17.0 g/dL   HCT 16.1 (L) 09.6 - 04.5 %   MCV 95.5 80.0 - 100.0 fL   MCH 29.6 26.0 - 34.0 pg   MCHC 30.9 30.0 - 36.0 g/dL   RDW 40.9 81.1 - 91.4 %   Platelets 165 150 -  400 K/uL   nRBC 0.0 0.0 - 0.2 %   Neutrophils Relative % 93 %   Neutro Abs 7.4 1.7 - 7.7 K/uL   Lymphocytes Relative 5 %   Lymphs Abs 0.4 (L) 0.7 - 4.0 K/uL   Monocytes Relative 1 %   Monocytes Absolute 0.0 (L) 0.1 - 1.0 K/uL   Eosinophils Relative 0 %   Eosinophils Absolute 0.0 0.0 - 0.5 K/uL   Basophils Relative 0 %   Basophils Absolute 0.0 0.0 - 0.1 K/uL   Immature Granulocytes 1 %   Abs Immature Granulocytes 0.04 0.00 - 0.07 K/uL  Basic metabolic panel     Status: Abnormal   Collection Time: 06/21/23  9:23 AM  Result Value Ref Range   Sodium 138 135 - 145 mmol/L   Potassium 4.5 3.5 - 5.1 mmol/L   Chloride 106 98 - 111 mmol/L   CO2 18 (L) 22 - 32 mmol/L   Glucose, Bld 174 (H) 70 - 99 mg/dL   BUN 32 (H) 8 - 23 mg/dL   Creatinine, Ser 1.61 (H) 0.61 - 1.24 mg/dL   Calcium  9.3 8.9 - 10.3 mg/dL   GFR, Estimated 24 (L) >60 mL/min   Anion gap 14 5 - 15  Urinalysis, Routine w reflex microscopic -Urine, Catheterized; Indwelling urinary catheter     Status: Abnormal   Collection Time: 06/21/23  9:24 AM  Result Value Ref Range   Color, Urine YELLOW (A) YELLOW   APPearance TURBID (A) CLEAR   Specific Gravity, Urine 1.012 1.005 - 1.030   pH 5.0 5.0 - 8.0   Glucose, UA >=500 (A) NEGATIVE mg/dL   Hgb urine dipstick LARGE (A) NEGATIVE   Bilirubin Urine NEGATIVE NEGATIVE   Ketones, ur NEGATIVE NEGATIVE mg/dL   Protein, ur >=096 (A) NEGATIVE mg/dL   Nitrite NEGATIVE NEGATIVE   Leukocytes,Ua MODERATE (A) NEGATIVE   RBC / HPF >50 0 - 5  RBC/hpf   WBC, UA >50 0 - 5 WBC/hpf   Bacteria, UA MANY (A) NONE SEEN   Squamous Epithelial / HPF 0 0 - 5 /HPF   WBC Clumps PRESENT    No results found for this or any previous visit (from the past 240 hours).  Renal Function: Recent Labs    06/21/23 0923  CREATININE 2.92*   Estimated Creatinine Clearance: 22.6 mL/min (A) (by C-G formula based on SCr of 2.92 mg/dL (H)).  Radiologic Imaging: No results found.  I independently reviewed the above imaging studies.  Procedure: As mentioned above, patient's Foley was palpable with the balloon partially inflated in the penile urethra.  With the patient's permission I gently pulled on it and was able to remove it completely.  Thereafter he was prepped in the usual sterile fashion.  I easily advanced an 21 [place] coud catheter into his bladder with return of clear yellow urine.  The appropriate collection bag was connected and the catheter balloon was inflated with 10 cc sterile water.  Impression/Recommendation 63 year old gentleman with history of urinary retention with dislodged Foley that cannot be removed.  Ultimately I was able to pull on the catheter and remove it without too much issue.  I was able to easily replace an 21 [place] coud catheter thereafter with return of clear urine.  Patient may be discharged from the ED with catheter in place.  I would expect him to have some blood around the catheter.  Also ED staff will be discharging him with antibiotics given the manipulation.  Julene Oaks MD 06/21/2023,  4:06 PM  [facility]  Pager: [phone]

## 2023-06-21 NOTE — Discharge Instructions (Signed)
 We replaced the Foley catheter but it took multiple attempts.  He is started on antibiotics to prevent infection considering the manipulation required.  Return to the ED with any worsening symptoms.

## 2023-06-21 NOTE — ED Provider Notes (Signed)
 [facility] Provider Note    Event Date/Time   First MD Initiated Contact with Patient 06/21/23 [phone]     (approximate)   History   Catheter Issues   HPI  Jay Flores is a 63 y.o. male who presents to the ED for evaluation of Catheter Issues   Review of nephrology clinic visit from 3 days ago.  History of CKD, HIV, DM and HTN.  History of prostate cancer s/p gold seed placement, radiation therapy, urinary retention and BPH with a chronic Foley catheter.  Patient presents to the ED from his SNF due to discomfort of his Foley catheter.  He reports he has pain in the base of his penis and he shows me a painful spot and asked me to touch it.  Denies to me that he has been pulling on the catheter, but reports from EMS was that he was tugging on it.    Physical Exam   Triage Vital Signs: ED Triage Vitals  Encounter Vitals Group     BP 06/21/23 0818 (!) 157/74     Systolic BP Percentile --      Diastolic BP Percentile --      Pulse Rate 06/21/23 0818 98     Resp 06/21/23 0818 18     Temp 06/21/23 0818 98.7 F (37.1 C)     Temp Source 06/21/23 0818 Oral     SpO2 06/21/23 0818 100 %     Weight 06/21/23 0820 134 lb 8 oz (61 kg)     Height --      Head Circumference --      Peak Flow --      Pain Score --      Pain Loc --      Pain Education --      Exclude from Growth Chart --     Most recent vital signs: Vitals:   06/21/23 1000 06/21/23 1030  BP: 134/77 113/74  Pulse: 92 89  Resp:  18  Temp:  98.7 F (37.1 C)  SpO2: 97% 98%    General: Awake, no distress.  CV:  Good peripheral perfusion.  Resp:  Normal effort.  Abd:  No distention.  MSK:  No deformity noted.  Neuro:  No focal deficits appreciated. Other:  Palpable Foley bulb at the base of the penis, likely urethral placement   ED Results / Procedures / Treatments   Labs (all labs ordered are listed, but only abnormal results are displayed) Labs Reviewed  CBC WITH  DIFFERENTIAL/PLATELET - Abnormal; Notable for the following components:      Result Value   RBC 3.35 (*)    Hemoglobin 9.9 (*)    HCT 32.0 (*)    Lymphs Abs 0.4 (*)    Monocytes Absolute 0.0 (*)    All other components within normal limits  BASIC METABOLIC PANEL WITH GFR - Abnormal; Notable for the following components:   CO2 18 (*)    Glucose, Bld 174 (*)    BUN 32 (*)    Creatinine, Ser 2.92 (*)    GFR, Estimated 24 (*)    All other components within normal limits  URINALYSIS, ROUTINE W REFLEX MICROSCOPIC - Abnormal; Notable for the following components:   Color, Urine YELLOW (*)    APPearance TURBID (*)    Glucose, UA >=500 (*)    Hgb urine dipstick LARGE (*)    Protein, ur >=300 (*)    Leukocytes,Ua MODERATE (*)    Bacteria,  UA MANY (*)    All other components within normal limits  URINE CULTURE    EKG   RADIOLOGY   Official radiology report(s): No results found.  PROCEDURES and INTERVENTIONS:  Procedures  Medications  lidocaine  (XYLOCAINE ) 2 % jelly 1 Application (0 Applications Urethral Hold 06/21/23 0928)  HYDROmorphone (DILAUDID) injection 0.5 mg (0.5 mg Intravenous Given 06/21/23 0926)  cephALEXin  (KEFLEX ) capsule 500 mg (500 mg Oral Given 06/21/23 1232)     IMPRESSION / MDM / ASSESSMENT AND PLAN / ED COURSE  I reviewed the triage vital signs and the nursing notes.  Differential diagnosis includes, but is not limited to, obstructed Foley catheter, acute cystitis, sepsis, blood loss anemia  {Patient presents with symptoms of an acute illness or injury that is potentially life-threatening.  Patient presents due to discomfort from his Foley catheter, likely urethral balloon requiring urology to help remove it and ultimately replacement of this catheter and antibiotic administration.  As below, multiple attempts to remove the catheter was unsuccessful so a consult with urology who assists at the bedside and removed the catheter manually.  Significant urethral  trauma so we placed a new coud catheter and initiate antibiotics due to instrumentation.  Send the urine for culture.  Improving hemoglobin.  CKD near baseline.  Suitable for outpatient management with antibiotics, renally adjusted  Clinical Course as of 06/21/23 1309  Fri Jun 21, 2023  0919 Most recent urology notes I can see from 3/3, 2 months ago where he had an 22 [place] coud placed by urology. [DS]  1610 Have secretary page urology, Dr. Jarvis Mesa [DS]  [phone] Unable to pull back fluid from the presumably inflated Foley catheter bulb.  With a syringe we get less than 1 mL of water coming back from the balloon port.  Foley catheter is still unable to be removed.  Causing quite a bit of pain.  There is a firmness along the catheter near the balloon port that causes concern for some sort of obstruction along this line so we used a pair of shears to cut the Foley catheter with at least 6 inches remaining external outside the urethra, still does not drain fluid and still unable to remove the catheter. [DS]  5409 I consult with Urology. Use the stiff end of a wire to try to clear obstruction via small port hole.  [DS]  1015 I attempted this technique without success.  Page Urology back [DS]  1030 I consult with Dr. Jarvis Mesa who will try to be here in the next hour, will have to shuffle around clinic patients [DS]  1205 Urology at the bedside removes Foley catheter.  Planning to place a fresh coud [DS]    Clinical Course User Index [DS] Arline Bennett, MD     FINAL CLINICAL IMPRESSION(S) / ED DIAGNOSES   Final diagnoses:  Foley catheter problem, initial encounter [facility])     Rx / DC Orders   ED Discharge Orders          Ordered    cephALEXin  (KEFLEX ) 500 MG capsule  3 times daily,   Status:  Discontinued        06/21/23 1217    cephALEXin  (KEFLEX ) 500 MG capsule  4 times daily,   Status:  Discontinued        06/21/23 1307    cephALEXin  (KEFLEX ) 500 MG capsule  2 times daily,   Status:   Discontinued        06/21/23 1307    cephALEXin  (KEFLEX ) 250 MG  capsule  2 times daily        06/21/23 1308             Note:  This document was prepared using Dragon voice recognition software and may include unintentional dictation errors.   Arline Bennett, MD 06/21/23 1310

## 2023-06-21 NOTE — Telephone Encounter (Signed)
 RX resent

## 2023-06-21 NOTE — ED Triage Notes (Signed)
 [facility] reports pt coming from [facility]. Pt states his foley catheter has been bothering him and stinging and  pt tried to take it out himself. Catheter still in place and appears to be draining with dried blood on tubing.

## 2023-06-23 LAB — URINE CULTURE: Culture: 50000 — AB

## 2023-06-24 ENCOUNTER — Ambulatory Visit

## 2023-06-24 ENCOUNTER — Encounter

## 2023-06-25 ENCOUNTER — Ambulatory Visit

## 2023-06-26 ENCOUNTER — Ambulatory Visit

## 2023-07-06 ENCOUNTER — Emergency Department

## 2023-07-06 ENCOUNTER — Inpatient Hospital Stay
Admission: EM | Admit: 2023-07-06 | Discharge: 2023-07-08 | DRG: 974 | Disposition: A | Discharge status: Home / Self Care | Attending: Osteopathic Medicine | Admitting: Osteopathic Medicine

## 2023-07-06 ENCOUNTER — Other Ambulatory Visit: Payer: Self-pay

## 2023-07-06 ENCOUNTER — Encounter: Payer: Self-pay | Admitting: Internal Medicine

## 2023-07-06 DIAGNOSIS — Z8546 Personal history of malignant neoplasm of prostate: Secondary | ICD-10-CM

## 2023-07-06 DIAGNOSIS — E1140 Type 2 diabetes mellitus with diabetic neuropathy, unspecified: Secondary | ICD-10-CM | POA: Diagnosis present

## 2023-07-06 DIAGNOSIS — N390 Urinary tract infection, site not specified: Secondary | ICD-10-CM | POA: Diagnosis present

## 2023-07-06 DIAGNOSIS — N184 Chronic kidney disease, stage 4 (severe): Secondary | ICD-10-CM | POA: Diagnosis present

## 2023-07-06 DIAGNOSIS — Y846 Urinary catheterization as the cause of abnormal reaction of the patient, or of later complication, without mention of misadventure at the time of the procedure: Secondary | ICD-10-CM | POA: Diagnosis present

## 2023-07-06 DIAGNOSIS — E1122 Type 2 diabetes mellitus with diabetic chronic kidney disease: Secondary | ICD-10-CM | POA: Diagnosis present

## 2023-07-06 DIAGNOSIS — F32A Depression, unspecified: Secondary | ICD-10-CM | POA: Diagnosis present

## 2023-07-06 DIAGNOSIS — E785 Hyperlipidemia, unspecified: Secondary | ICD-10-CM | POA: Diagnosis present

## 2023-07-06 DIAGNOSIS — T83511A Infection and inflammatory reaction due to indwelling urethral catheter, initial encounter: Secondary | ICD-10-CM | POA: Diagnosis present

## 2023-07-06 DIAGNOSIS — Z860100 Personal history of colon polyps, unspecified: Secondary | ICD-10-CM

## 2023-07-06 DIAGNOSIS — K862 Cyst of pancreas: Secondary | ICD-10-CM | POA: Diagnosis present

## 2023-07-06 DIAGNOSIS — Z794 Long term (current) use of insulin: Secondary | ICD-10-CM | POA: Diagnosis not present

## 2023-07-06 DIAGNOSIS — B961 Klebsiella pneumoniae [K. pneumoniae] as the cause of diseases classified elsewhere: Secondary | ICD-10-CM | POA: Diagnosis present

## 2023-07-06 DIAGNOSIS — R338 Other retention of urine: Secondary | ICD-10-CM | POA: Diagnosis present

## 2023-07-06 DIAGNOSIS — F0283 Dementia in other diseases classified elsewhere, unspecified severity, with mood disturbance: Secondary | ICD-10-CM | POA: Diagnosis present

## 2023-07-06 DIAGNOSIS — J440 Chronic obstructive pulmonary disease with acute lower respiratory infection: Secondary | ICD-10-CM | POA: Diagnosis present

## 2023-07-06 DIAGNOSIS — F17210 Nicotine dependence, cigarettes, uncomplicated: Secondary | ICD-10-CM | POA: Diagnosis present

## 2023-07-06 DIAGNOSIS — I129 Hypertensive chronic kidney disease with stage 1 through stage 4 chronic kidney disease, or unspecified chronic kidney disease: Secondary | ICD-10-CM | POA: Diagnosis present

## 2023-07-06 DIAGNOSIS — Z833 Family history of diabetes mellitus: Secondary | ICD-10-CM

## 2023-07-06 DIAGNOSIS — N3090 Cystitis, unspecified without hematuria: Principal | ICD-10-CM

## 2023-07-06 DIAGNOSIS — Z21 Asymptomatic human immunodeficiency virus [HIV] infection status: Secondary | ICD-10-CM | POA: Diagnosis present

## 2023-07-06 DIAGNOSIS — J189 Pneumonia, unspecified organism: Principal | ICD-10-CM | POA: Diagnosis present

## 2023-07-06 DIAGNOSIS — Z79899 Other long term (current) drug therapy: Secondary | ICD-10-CM

## 2023-07-06 DIAGNOSIS — E43 Unspecified severe protein-calorie malnutrition: Secondary | ICD-10-CM | POA: Diagnosis present

## 2023-07-06 DIAGNOSIS — N401 Enlarged prostate with lower urinary tract symptoms: Secondary | ICD-10-CM | POA: Diagnosis present

## 2023-07-06 DIAGNOSIS — Z7984 Long term (current) use of oral hypoglycemic drugs: Secondary | ICD-10-CM | POA: Diagnosis not present

## 2023-07-06 DIAGNOSIS — Z681 Body mass index (BMI) 19 or less, adult: Secondary | ICD-10-CM | POA: Diagnosis not present

## 2023-07-06 DIAGNOSIS — I10 Essential (primary) hypertension: Secondary | ICD-10-CM | POA: Diagnosis present

## 2023-07-06 DIAGNOSIS — R0789 Other chest pain: Secondary | ICD-10-CM | POA: Insufficient documentation

## 2023-07-06 DIAGNOSIS — E1129 Type 2 diabetes mellitus with other diabetic kidney complication: Secondary | ICD-10-CM | POA: Diagnosis present

## 2023-07-06 DIAGNOSIS — B20 Human immunodeficiency virus [HIV] disease: Secondary | ICD-10-CM | POA: Diagnosis present

## 2023-07-06 DIAGNOSIS — C61 Malignant neoplasm of prostate: Secondary | ICD-10-CM | POA: Diagnosis present

## 2023-07-06 DIAGNOSIS — Z720 Tobacco use: Secondary | ICD-10-CM | POA: Diagnosis present

## 2023-07-06 LAB — URINALYSIS, ROUTINE W REFLEX MICROSCOPIC
Bilirubin Urine: NEGATIVE
Glucose, UA: >=500 mg/dL — AB
Ketones, ur: NEGATIVE mg/dL
Nitrite: POSITIVE — AB
Protein, ur: 100 mg/dL — AB
Specific Gravity, Urine: 1.016 (ref 1.005–1.030)
WBC, UA: >50 WBC/hpf (ref 0–5)
pH: 5 (ref 5.0–8.0)

## 2023-07-06 LAB — COMPREHENSIVE METABOLIC PANEL WITH GFR
ALT: 9 U/L (ref 0–44)
AST: 16 U/L (ref 15–41)
Albumin: 2.7 g/dL — ABNORMAL LOW (ref 3.5–5.0)
Alkaline Phosphatase: 61 U/L (ref 38–126)
Anion gap: 9 (ref 5–15)
BUN: 24 mg/dL — ABNORMAL HIGH (ref 8–23)
CO2: 23 mmol/L (ref 22–32)
Calcium: 8.9 mg/dL (ref 8.9–10.3)
Chloride: 103 mmol/L (ref 98–111)
Creatinine, Ser: 2.68 mg/dL — ABNORMAL HIGH (ref 0.61–1.24)
GFR, Estimated: 26 mL/min — ABNORMAL LOW (ref >60)
Glucose, Bld: 277 mg/dL — ABNORMAL HIGH (ref 70–99)
Potassium: 4.4 mmol/L (ref 3.5–5.1)
Sodium: 135 mmol/L (ref 135–145)
Total Bilirubin: 0.7 mg/dL (ref 0.0–1.2)
Total Protein: 7.6 g/dL (ref 6.5–8.1)

## 2023-07-06 LAB — CBC WITH DIFFERENTIAL/PLATELET
Abs Immature Granulocytes: 0.06 10*3/uL (ref 0.00–0.07)
Basophils Absolute: 0 10*3/uL (ref 0.0–0.1)
Basophils Relative: 0 %
Eosinophils Absolute: 0.1 10*3/uL (ref 0.0–0.5)
Eosinophils Relative: 1 %
HCT: 27.4 % — ABNORMAL LOW (ref 39.0–52.0)
Hemoglobin: 8.4 g/dL — ABNORMAL LOW (ref 13.0–17.0)
Immature Granulocytes: 1 %
Lymphocytes Relative: 18 %
Lymphs Abs: 1.8 10*3/uL (ref 0.7–4.0)
MCH: 28.7 pg (ref 26.0–34.0)
MCHC: 30.7 g/dL (ref 30.0–36.0)
MCV: 93.5 fL (ref 80.0–100.0)
Monocytes Absolute: 0.6 10*3/uL (ref 0.1–1.0)
Monocytes Relative: 6 %
Neutro Abs: 7.4 10*3/uL (ref 1.7–7.7)
Neutrophils Relative %: 74 %
Platelets: 148 10*3/uL — ABNORMAL LOW (ref 150–400)
RBC: 2.93 MIL/uL — ABNORMAL LOW (ref 4.22–5.81)
RDW: 13.2 % (ref 11.5–15.5)
WBC: 9.9 10*3/uL (ref 4.0–10.5)
nRBC: 0 % (ref 0.0–0.2)

## 2023-07-06 LAB — GLUCOSE, CAPILLARY
Glucose-Capillary: 171 mg/dL — ABNORMAL HIGH (ref 70–99)
Glucose-Capillary: 206 mg/dL — ABNORMAL HIGH (ref 70–99)

## 2023-07-06 LAB — TROPONIN I (HIGH SENSITIVITY)
Troponin I (High Sensitivity): 12 ng/L (ref <18)
Troponin I (High Sensitivity): 13 ng/L (ref <18)

## 2023-07-06 LAB — LIPASE, BLOOD: Lipase: 20 U/L (ref 11–51)

## 2023-07-06 MED ORDER — SODIUM BICARBONATE 650 MG PO TABS
650.0000 mg | ORAL_TABLET | Freq: Two times a day (BID) | ORAL | Status: DC
  Administered 2023-07-08: 650 mg via ORAL
  Filled 2023-07-06: qty 1

## 2023-07-06 MED ORDER — HYDROMORPHONE HCL 1 MG/ML IJ SOLN
0.5000 mg | Freq: Once | INTRAMUSCULAR | Status: AC
  Administered 2023-07-06: 0.5 mg via INTRAVENOUS
  Filled 2023-07-06: qty 0.5

## 2023-07-06 MED ORDER — HYDRALAZINE HCL 20 MG/ML IJ SOLN
5.0000 mg | Freq: Four times a day (QID) | INTRAMUSCULAR | Status: DC | PRN
Start: 2023-07-06 — End: 2023-07-11

## 2023-07-06 MED ORDER — FOLIC ACID 1 MG PO TABS
1.0000 mg | ORAL_TABLET | Freq: Every day | ORAL | Status: DC
  Administered 2023-07-06 – 2023-07-08 (×3): 1 mg via ORAL
  Filled 2023-07-06 (×3): qty 1

## 2023-07-06 MED ORDER — FENTANYL CITRATE PF 50 MCG/ML IJ SOSY: 25.0000 ug | PREFILLED_SYRINGE | INTRAMUSCULAR | Status: AC | PRN

## 2023-07-06 MED ORDER — SENNOSIDES-DOCUSATE SODIUM 8.6-50 MG PO TABS: 1.0000 | ORAL_TABLET | Freq: Every evening | ORAL | Status: DC | PRN

## 2023-07-06 MED ORDER — ONDANSETRON HCL 4 MG/2ML IJ SOLN
4.0000 mg | Freq: Once | INTRAMUSCULAR | Status: AC
Start: 2023-07-06 — End: 2023-07-06
  Administered 2023-07-06: 4 mg via INTRAVENOUS
  Filled 2023-07-06: qty 2

## 2023-07-06 MED ORDER — ONDANSETRON HCL 4 MG PO TABS: 4.0000 mg | ORAL_TABLET | Freq: Four times a day (QID) | ORAL | Status: DC | PRN

## 2023-07-06 MED ORDER — SODIUM CHLORIDE 0.9 % IV SOLN
2.0000 g | INTRAVENOUS | Status: DC
  Administered 2023-07-07 – 2023-07-08 (×2): 2 g via INTRAVENOUS
  Filled 2023-07-06 (×2): qty 20

## 2023-07-06 MED ORDER — VITAMIN D 25 MCG (1000 UNIT) PO TABS
1000.0000 [IU] | ORAL_TABLET | Freq: Every day | ORAL | Status: DC
  Administered 2023-07-06 – 2023-07-07 (×2): 1000 [IU] via ORAL
  Filled 2023-07-06 (×2): qty 1

## 2023-07-06 MED ORDER — SODIUM CHLORIDE 0.9 % IV SOLN
1.0000 g | Freq: Once | INTRAVENOUS | Status: AC
  Administered 2023-07-06: 1 g via INTRAVENOUS
  Filled 2023-07-06: qty 10

## 2023-07-06 MED ORDER — SODIUM CHLORIDE 0.9 % IV SOLN
500.0000 mg | INTRAVENOUS | Status: DC
  Administered 2023-07-07 – 2023-07-08 (×2): 500 mg via INTRAVENOUS
  Filled 2023-07-06 (×2): qty 5

## 2023-07-06 MED ORDER — INSULIN ASPART 100 UNIT/ML IJ SOLN
0.0000 [IU] | Freq: Every day | INTRAMUSCULAR | Status: DC
  Administered 2023-07-06: 2 [IU] via SUBCUTANEOUS
  Filled 2023-07-06: qty 1

## 2023-07-06 MED ORDER — SODIUM CHLORIDE 0.9 % IV SOLN
500.0000 mg | Freq: Once | INTRAVENOUS | Status: AC
  Administered 2023-07-06: 500 mg via INTRAVENOUS
  Filled 2023-07-06: qty 5

## 2023-07-06 MED ORDER — ORAL CARE MOUTH RINSE: 15.0000 mL | OROMUCOSAL | Status: DC | PRN

## 2023-07-06 MED ORDER — ACETAMINOPHEN 650 MG RE SUPP: 650.0000 mg | Freq: Four times a day (QID) | RECTAL | Status: DC | PRN

## 2023-07-06 MED ORDER — HEPARIN SODIUM (PORCINE) 5000 UNIT/ML IJ SOLN
5000.0000 [IU] | Freq: Three times a day (TID) | INTRAMUSCULAR | Status: DC
  Administered 2023-07-06 – 2023-07-08 (×6): 5000 [IU] via SUBCUTANEOUS
  Filled 2023-07-06 (×6): qty 1

## 2023-07-06 MED ORDER — HYDROMORPHONE HCL 1 MG/ML IJ SOLN: 0.5000 mg | INTRAMUSCULAR | Status: AC | PRN

## 2023-07-06 MED ORDER — INSULIN ASPART 100 UNIT/ML IJ SOLN
0.0000 [IU] | Freq: Three times a day (TID) | INTRAMUSCULAR | Status: DC
  Administered 2023-07-06 – 2023-07-08 (×5): 2 [IU] via SUBCUTANEOUS
  Filled 2023-07-06 (×5): qty 1

## 2023-07-06 MED ORDER — ATORVASTATIN CALCIUM 20 MG PO TABS
20.0000 mg | ORAL_TABLET | Freq: Every day | ORAL | Status: DC
  Administered 2023-07-06 – 2023-07-07 (×2): 20 mg via ORAL
  Filled 2023-07-06: qty 1

## 2023-07-06 MED ORDER — VILAZODONE HCL 20 MG PO TABS
20.0000 mg | ORAL_TABLET | Freq: Every day | ORAL | Status: DC
  Administered 2023-07-06 – 2023-07-08 (×3): 20 mg via ORAL
  Filled 2023-07-06 (×3): qty 1

## 2023-07-06 MED ORDER — ONDANSETRON HCL 4 MG/2ML IJ SOLN: 4.0000 mg | Freq: Four times a day (QID) | INTRAMUSCULAR | Status: DC | PRN

## 2023-07-06 MED ORDER — FINASTERIDE 5 MG PO TABS
5.0000 mg | ORAL_TABLET | Freq: Every day | ORAL | Status: DC
  Administered 2023-07-06 – 2023-07-08 (×3): 5 mg via ORAL
  Filled 2023-07-06 (×4): qty 1

## 2023-07-06 MED ORDER — METOPROLOL SUCCINATE ER 50 MG PO TB24
100.0000 mg | ORAL_TABLET | Freq: Every day | ORAL | Status: DC
  Administered 2023-07-06 – 2023-07-08 (×3): 100 mg via ORAL
  Filled 2023-07-06 (×3): qty 2

## 2023-07-06 MED ORDER — DOLUTEGRAVIR-RILPIVIRINE 50-25 MG PO TABS
1.0000 | ORAL_TABLET | Freq: Every day | ORAL | Status: DC
  Administered 2023-07-06 – 2023-07-08 (×3): 1 via ORAL
  Filled 2023-07-06 (×3): qty 1

## 2023-07-06 MED ORDER — ACETAMINOPHEN 325 MG PO TABS
650.0000 mg | ORAL_TABLET | Freq: Four times a day (QID) | ORAL | Status: DC | PRN
Start: 2023-07-06 — End: 2023-07-11
  Administered 2023-07-07 (×2): 650 mg via ORAL
  Filled 2023-07-06 (×2): qty 2

## 2023-07-06 MED ORDER — LOSARTAN POTASSIUM 50 MG PO TABS
100.0000 mg | ORAL_TABLET | Freq: Every day | ORAL | Status: DC
  Administered 2023-07-07 – 2023-07-08 (×2): 100 mg via ORAL
  Filled 2023-07-06 (×2): qty 2

## 2023-07-06 NOTE — Assessment & Plan Note (Addendum)
 Home Vilazodone 20 mg daily resumed

## 2023-07-06 NOTE — Assessment & Plan Note (Signed)
 Resume home antiviral medication, JULUCA  on admission

## 2023-07-06 NOTE — H&P (Signed)
 History and Physical   Jay Flores:[id number] DOB: [birth year] DOA: 07/06/2023  PCP: [name], MD  Patient coming from: [facility] via EMS  I have personally briefly reviewed patient's old medical records in [facility] EMR.  Chief Concern: chest pain, shortness of breath  HPI: Mr. Jay Flores is a 63 year old male with history of HIV, prostate cancer, hypertension, BPH with chronic indwelling Foley catheter, history of Klebsiella pneumonia UTI, bladder tumor, CKD stage IV, presents emergency department for chief concerns of chest pain abdominal pain.  Vitals in the ED showed T of 98.3, rr 25, hr 79, blood pressure 137/77, SpO2 100% on room air.  Serum sodium is 135, potassium 4.4, chloride 103, bicarb 23, BUN of 24, serum creatinine 2.68, nonfasting blood glucose 277, EGFR of 26, WBC 9.9, hemoglobin 8.4, platelets of 148.  HS troponin is 12.  UA was positive for large leukocytes and large nitrate.  ED treatment: Dilaudid  0.5 mg IV x 2, ondansetron  4 mg IV one-time dose, azithromycin  500 mg IV, ceftriaxone  1 g IV. --------------------------------- At bedside, patient was able to tell me his first and last name, his location of [place], his age of 63.  He was not able to tell me the current calendar year or the current month.  He reports he started having chest discomfort today.  He denies any abdominal pain.  He denies fever, chills, shortness of breath, cough, diarrhea, blood in his stool.  Social history: He lives in a group home  ROS: Unable to accurately complete as patient has HIV dementia  ED Course: Discussed with EDP, patient required hospitalization for chief concerns of left lower lobe pneumonia.  Assessment/Plan  Principal Problem:   Community acquired pneumonia Active Problems:   HIV (human immunodeficiency virus infection) (HCC)   Prostate cancer (HCC)   Tobacco use   HTN (hypertension)   HLD (hyperlipidemia)   Type II diabetes  mellitus with renal manifestations (HCC)   PNA (pneumonia)   HIV dementia (HCC)   Chest discomfort   Depression   Assessment and Plan:  * Community acquired pneumonia Azithromycin  500 mg IV daily, ceftriaxone  2 g IV daily to complete a 5-day course  HIV (human immunodeficiency virus infection) (HCC) Resume home antiviral medication, JULUCA  on admission  HLD (hyperlipidemia) Home atorvastatin  20 mg nightly resumed  HTN (hypertension) Losartan  100 mg daily, metoprolol succinate 100 mg daily resumed Hydralazine  5 mg IV every 6 hours as needed for SBP greater 170  Type II diabetes mellitus with renal manifestations (HCC) Home metformin will not be resumed on admission Insulin  SSI with at bedtime coverage ordered  Depression Home Vilazodone 20 mg daily resumed  Chest discomfort Noncardiac chest discomfort Suspect secondary to pneumonia Treatment per above Symptomatic support: Fentanyl  25 mcg IV every 4 hours.  For moderate pain, 20 hours ordered; Dilaudid  0.5 mg IV every 4 hours as needed for severe pain, 20 hours ordered  Chart reviewed.   DVT prophylaxis: Heparin  5000 units subcutaneous every 8 hours Code Status: full code  Diet: Heart healthy/carb modified Family Communication: Patient states no Disposition Plan: Pending course Consults called: None at this time Admission status: Telemetry medical, inpatient  Past Medical History:  Diagnosis Date   Acute renal failure (HCC)    Altered mental status 01/20/2012   Anemia    Brain lesion    Cholelithiasis    Chronic indwelling Foley catheter    CKD (chronic kidney disease) stage 3, GFR 30-59 ml/min (HCC)    COPD (  chronic obstructive pulmonary disease) (HCC)    Diabetic neuropathy (HCC)    DKA (diabetic ketoacidosis) (HCC)    DM (diabetes mellitus), type 2 (HCC)    Elevated PSA    Gait instability    uses walker   H/O urinary retention    Hepatitis C infection    s/p Harvoni   History of cocaine use    last  used in 2012   HIV (human immunodeficiency virus infection) (HCC)    HIV dementia (HCC)    Hypertension    IPMN (intraductal papillary mucinous neoplasm)    Lives in group home    [facility]   Metabolic acidosis    Pancreatic cyst    Pneumonia    Thrombocytopenia (HCC)    Tobacco use    Past Surgical History:  Procedure Laterality Date   COLONOSCOPY WITH PROPOFOL  N/A 09/05/2022   Procedure: COLONOSCOPY WITH PROPOFOL ;  Surgeon: Toledo, Alphonsus Jeans, MD;  Location: [facility] ENDOSCOPY;  Service: Gastroenterology;  Laterality: N/A;   EUS N/A 04/19/2022   Procedure: UPPER ENDOSCOPIC ULTRASOUND (EUS) LINEAR;  Surgeon: Rayford Cake, MD;  Location: [facility] ENDOSCOPY;  Service: Gastroenterology;  Laterality: N/A;  Requesting 1p slot   HEMOSTASIS CLIP PLACEMENT  09/05/2022   Procedure: HEMOSTASIS CLIP PLACEMENT;  Surgeon: Corky Diener, Alphonsus Jeans, MD;  Location: [facility] ENDOSCOPY;  Service: Gastroenterology;;   POLYPECTOMY  09/05/2022   Procedure: POLYPECTOMY;  Surgeon: Corky Diener, Alphonsus Jeans, MD;  Location: [facility] ENDOSCOPY;  Service: Gastroenterology;;   PROSTATE BIOPSY N/A 02/26/2023   Procedure: PROSTATE BIOPSY;  Surgeon: Geraline Knapp, MD;  Location: [facility] ORS;  Service: Urology;  Laterality: N/A;   TRANSRECTAL ULTRASOUND N/A 02/26/2023   Procedure: TRANSRECTAL ULTRASOUND;  Surgeon: Geraline Knapp, MD;  Location: [facility] ORS;  Service: Urology;  Laterality: N/A;   Social History:  reports that he has been smoking cigarettes. He has a 34 pack-year smoking history. He has been exposed to tobacco smoke. He has never used smokeless tobacco. He reports that he does not currently use drugs after having used the following drugs: Cocaine. He reports that he does not drink alcohol.  No Known Allergies Family History  Problem Relation Age of Onset   Diabetes Mellitus II Mother    Family history: Family history reviewed and not pertinent.  Prior to Admission medications   Medication Sig  Start Date End Date Taking? Authorizing Provider  loperamide (IMODIUM) 2 MG capsule Take 4 mg by mouth 2 (two) times daily. 06/06/23  Yes [provider]  losartan  (COZAAR ) 25 MG tablet Take 25 mg by mouth daily. 07/02/23  Yes [provider]  metFORMIN (GLUCOPHAGE) 500 MG tablet Take 500 mg by mouth daily. 07/03/23  Yes [provider]  sucralfate (CARAFATE) 1 g tablet Take 1 g by mouth 2 (two) times daily. 07/02/23  Yes [provider]  Vilazodone HCl 20 MG TABS Take 20 mg by mouth daily.   Yes [provider]  Accu-Chek Softclix Lancets lancets by Other route. Use as instructed    [provider]  acetaminophen  (TYLENOL  8 HOUR ARTHRITIS PAIN) 650 MG CR tablet Take 650 mg by mouth every 4 (four) hours as needed for pain.    [provider]  atorvastatin  (LIPITOR) 20 MG tablet Take 20 mg by mouth at bedtime.    [provider]  cholecalciferol (VITAMIN D3) 25 MCG (1000 UNIT) tablet Take 1,000 Units by mouth at bedtime.    [provider]  Continuous Blood Gluc  Receiver (FREESTYLE LIBRE 14 DAY READER) DEVI Apply topically. 10/31/21   [provider]  Continuous Blood Gluc Sensor (FREESTYLE LIBRE 14 DAY SENSOR) MISC Apply topically. 01/17/22   [provider]  Dolutegravir -Rilpivirine  (JULUCA ) 50-25 MG TABS Take 1 tablet by mouth daily.    [provider]  finasteride  (PROSCAR ) 5 MG tablet Take 5 mg by mouth daily.    [provider]  folic acid  (FOLVITE ) 1 MG tablet Take 1 tablet (1 mg total) by mouth daily. 04/27/23   Aisha Hove, MD  glucose blood (ACCU-CHEK AVIVA PLUS) test strip 1 each by Other route in the morning, at noon, and at bedtime. Use as instructed    [provider]  JARDIANCE 25 MG TABS tablet Take 25 mg by mouth daily. 01/14/23   [provider]  losartan  (COZAAR ) 100 MG tablet Take 100 mg by mouth daily.    [provider]  metoprolol  succinate (TOPROL-XL) 100 MG 24 hr tablet Take 100 mg by mouth daily. 09/03/20   [provider]  neomycin-bacitracin-polymyxin [phone] OINT Apply 1 Application topically daily. 03/15/23   Dot Gazella, DPM  nicotine  (NICODERM CQ  - DOSED IN MG/24 HOURS) 21 mg/24hr patch Place 1 patch (21 mg total) onto the skin daily. 04/27/23   Aisha Hove, MD  sodium bicarbonate  650 MG tablet Take 650 mg by mouth 2 (two) times daily.    [provider]   Physical Exam: Vitals:   07/06/23 1300 07/06/23 1330 07/06/23 1400 07/06/23 1430  BP: 130/76 112/73 (!) 124/92 131/76  Pulse: 70 79  74  Resp: (!) 22 (!) 23 (!) 21 (!) 26  Temp:      TempSrc:      SpO2: 99% 98%  97%  Weight:      Height:       Constitutional: appears frail, cachectic appearing, chronically ill Eyes: PERRL, lids and conjunctivae normal HENMT: Bilateral temporal wasting, mucous membranes are moist. Posterior pharynx clear of any exudate or lesions. Age-appropriate dentition. Hearing appropriate Neck: normal, supple, no masses, no thyromegaly Respiratory: Decreased lung sounds in the left lower lobe, no wheezing, no crackles. Normal respiratory effort. No accessory muscle use.  Cardiovascular: Regular rate and rhythm, no murmurs / rubs / gallops. No extremity edema. 2+ pedal pulses. No carotid bruits.  Abdomen: no tenderness, no masses palpated, no hepatosplenomegaly. Bowel sounds positive.  Musculoskeletal: no clubbing / cyanosis. No joint deformity upper and lower extremities. Good ROM, no contractures, no atrophy. Normal muscle tone.  Skin: no rashes, lesions, ulcers. No induration Neurologic: Sensation intact. Strength 5/5 in all 4.  Psychiatric: Normal judgment and insight. Alert and oriented x 3. Normal mood.   EKG: independently reviewed, showing sinus rhythm with rate of 81, QTc 445  Chest x-ray on Admission: I personally reviewed and I agree with radiologist reading as below.  DG Chest 2  View Result Date: 07/06/2023 CLINICAL DATA:  Chest pain.  History of prostate cancer and AIDS. EXAM: CHEST - 2 VIEW COMPARISON:  04/24/2023 FINDINGS: Normal sized heart. Mild patchy opacity at the posterior lung bases on the lateral view and mild patchy opacity in the right lower lobe. Mild-to-moderate central peribronchial thickening. Unremarkable bones. IMPRESSION: 1. Mild bibasilar and right lower lobe patchy atelectasis or pneumonia. 2. Mild-to-moderate central bronchitic changes. Electronically Signed   By: Catherin Closs M.D.   On: 07/06/2023 11:09   CT ABDOMEN PELVIS WO CONTRAST Result Date: 07/06/2023 CLINICAL DATA:  63 year old male with acute abdominal pain. EXAM:  CT ABDOMEN AND PELVIS WITHOUT CONTRAST TECHNIQUE: Multidetector CT imaging of the abdomen and pelvis was performed following the standard protocol without IV contrast. RADIATION DOSE REDUCTION: This exam was performed according to the departmental dose-optimization program which includes automated exposure control, adjustment of the mA and/or kV according to patient size and/or use of iterative reconstruction technique. COMPARISON:  CT Abdomen 12/21/2008. FINDINGS: Lower chest: Elevation of the left hemidiaphragm is new since 2010, and from portable chest x-ray this year 04/24/2023. No cardiomegaly. No pericardial effusion. Confluent right lower lobe, costophrenic angle pulmonary ground-glass opacity. No other significant lung base opacity. Hepatobiliary: Cholelithiasis within a contracted gallbladder. No pericholecystic inflammation. Negative noncontrast liver. No bile duct enlargement is evident. Pancreas: Highly abnormal. Subtotal pancreatic atrophy superimposed on extensive parenchymal dystrophic calcifications and dilated main pancreatic duct. These findings terminates at the duodenum C-loop where a rounded cystic mass is present on series 2, image 28 and further described below. No peripancreatic inflammation. Spleen: The entire superior  pole of the spleen is not included but the visible noncontrast spleen appears negative. Adrenals/Urinary Tract: Normal adrenal glands. Nonobstructed kidneys. Diminutive ureters. Abnormal urinary bladder. A Foley catheter is within the bladder lumen. The base of the bladder is decompressed but there is severe abnormal anterior bladder wall thickening and a spiculated appearance which is indeterminate for inflammation (series 2, image 66). Abnormal thickening there of slightly over 3 cm as seen on coronal image 30. The dome of the bladder contains gas and is non thickened. See sagittal image 84. Pelvic phleboliths. Stomach/Bowel: Redundant but decompressed large bowel in the pelvis. Mild descending colon retained stool. Moderate transverse colon gaseous distension more so than retained stool. Decompressed flexures. Retained stool in the right colon without abnormal distention. Normal gas containing appendix on coronal image 44. Nondilated small bowel. Stomach is also mostly decompressed. However, there is a rounded 3.4 cm cystic mass with simple fluid density at the gastroduodenal junction, adjacent to abnormal pancreas (series 2, image 28 and coronal image 34), new since 2010. No regional inflammation. However, this may be a portion of the larger cystic mass which has an hourglass configuration (coronal image 33 uncertain). Duodenum is nondilated. No pneumoperitoneum. No free fluid. No mesenteric inflammation identified. Vascular/Lymphatic: Aortoiliac calcified atherosclerosis. Normal caliber abdominal aorta. Vascular patency is not evaluated in the absence of IV contrast. No lymphadenopathy identified. Reproductive: Urethral catheter in place. Other: No pelvis free fluid. Musculoskeletal: No acute or suspicious osseous lesion. IMPRESSION: ABDOMEN: 1. Extensive pancreatic atrophy and dystrophic calcification with dilated main pancreatic duct terminating at a Cystic Mass at the duodenal C-loop (3.4 cm unilocular  versus larger hour glass shaped, see coronal images 33 and 34), which is new since 2010. No regional inflammation or lymphadenopathy. Differential consideration includes obstructing cystic neoplasm, pseudocyst, choledochal cyst, enteric cyst. 2. Gallstones within contracted gallbladder. No CT evidence of acute cholecystitis or hepatic bile duct obstruction. PELVIS: 1. Bulky tumor versus inflammatory wall thickening at the anterior base of the urinary bladder, 3.2 cm in thickness. Foley catheter within the bladder. No regional lymphadenopathy. Kidneys and ureters appear nonobstructed. CHEST: 1. Right lower lobe pulmonary ground-glass opacity more suspicious for infection than atelectasis. No pleural fluid. 2. Elevation of the left hemidiaphragm is new since 2010 and from portable chest x-ray this year. 3.  Aortic Atherosclerosis (ICD10-I70.0). Electronically Signed   By: Marlise Simpers M.D.   On: 07/06/2023 10:34   Labs on Admission: I have personally reviewed following labs  CBC: Recent Labs  Lab 07/06/23 [phone]  WBC 9.9  NEUTROABS 7.4  HGB 8.4*  HCT 27.4*  MCV 93.5  PLT 148*   Basic Metabolic Panel: Recent Labs  Lab 07/06/23 0948  NA 135  K 4.4  CL 103  CO2 23  GLUCOSE 277*  BUN 24*  CREATININE 2.68*  CALCIUM  8.9   GFR: Estimated Creatinine Clearance: 24.7 mL/min (A) (by C-G formula based on SCr of 2.68 mg/dL (H)).  Liver Function Tests: Recent Labs  Lab 07/06/23 0948  AST 16  ALT 9  ALKPHOS 61  BILITOT 0.7  PROT 7.6  ALBUMIN 2.7*   Recent Labs  Lab 07/06/23 0948  LIPASE 20   Urine analysis:    Component Value Date/Time   COLORURINE YELLOW (A) 07/06/2023 0948   APPEARANCEUR TURBID (A) 07/06/2023 0948   APPEARANCEUR Cloudy (A) 02/11/2023 1123   LABSPEC 1.016 07/06/2023 0948   PHURINE 5.0 07/06/2023 0948   GLUCOSEU >=500 (A) 07/06/2023 0948   HGBUR MODERATE (A) 07/06/2023 0948   BILIRUBINUR NEGATIVE 07/06/2023 0948   BILIRUBINUR Negative 02/11/2023 1123   KETONESUR  NEGATIVE 07/06/2023 0948   PROTEINUR 100 (A) 07/06/2023 0948   UROBILINOGEN 1.0 01/23/2012 0602   NITRITE POSITIVE (A) 07/06/2023 0948   LEUKOCYTESUR LARGE (A) 07/06/2023 0948   This document was prepared using Dragon Voice Recognition software and may include unintentional dictation errors.  Dr. Reinhold Carbine [facility]  If 7PM-7AM, please contact overnight-coverage provider If 7AM-7PM, please contact day attending provider [email or web address]  07/06/2023, 3:11 PM

## 2023-07-06 NOTE — ED Triage Notes (Signed)
 Pt BIB [facility] from [facility] c/o Chest, back and abdominal pain that started yesterday am. Pt denies N/V, vision changes or headache. Hx of prostate cancer, UTI and AIDS. Pt is also c/o R sided flank pain upon palpation.  85HR 99 RA 119/76

## 2023-07-06 NOTE — Assessment & Plan Note (Signed)
 Noncardiac chest discomfort Suspect secondary to pneumonia Treatment per above Symptomatic support: Fentanyl  25 mcg IV every 4 hours.  For moderate pain, 20 hours ordered; Dilaudid  0.5 mg IV every 4 hours as needed for severe pain, 20 hours ordered

## 2023-07-06 NOTE — Assessment & Plan Note (Signed)
 Home metformin will not be resumed on admission Insulin SSI with at bedtime coverage ordered

## 2023-07-06 NOTE — Progress Notes (Signed)
 Patient is alert and oriented X 2. Not able to complete patient admission profile, called [employer] (group home owner) twice, but did not receive call.

## 2023-07-06 NOTE — Assessment & Plan Note (Signed)
Home atorvastatin 20 mg nightly resumed

## 2023-07-06 NOTE — Assessment & Plan Note (Signed)
 Azithromycin 500 mg IV daily, ceftriaxone 2 g IV daily to complete a 5-day course

## 2023-07-06 NOTE — Assessment & Plan Note (Signed)
 Losartan  100 mg daily, metoprolol succinate 100 mg daily resumed Hydralazine  5 mg IV every 6 hours as needed for SBP greater 170

## 2023-07-06 NOTE — ED Provider Notes (Signed)
 [facility] Provider Note    Event Date/Time   First MD Initiated Contact with Patient 07/06/23 [phone]     (approximate)   History   Chest Pain (ab) and Abdominal Pain   HPI  Jay Flores is a 63 y.o. male with a history of CKD, HIV, diabetes, hypertension, prostate cancer status post seed placement and radiation therapy, urinary retention and BPH with a chronic indwelling Foley catheter who presents with abdominal, chest, back pain since yesterday.  The patient reports pain and what he refers to is the right side of his abdomen but he is pointing more towards the left periumbilical abdomen and left flank.  He reports pain in the middle of his lower back as well as in his chest.  He denies any nausea or vomiting.  He has not had any diarrhea.  He denies cough, shortness of breath, or fever.  I reviewed the past medical records.  The patient was seen in the ED and also evaluated by urology on 5/16 with a dislodged urinary catheter that initially could not fully be removed.  Urology removed and replaced the catheter without complication.   Physical Exam   Triage Vital Signs: ED Triage Vitals  Encounter Vitals Group     BP 07/06/23 0943 137/77     Systolic BP Percentile --      Diastolic BP Percentile --      Pulse Rate 07/06/23 0943 79     Resp 07/06/23 0943 (!) 25     Temp 07/06/23 0943 98.3 F (36.8 C)     Temp Source 07/06/23 0943 Oral     SpO2 07/06/23 0943 100 %     Weight 07/06/23 0944 134 lb 7.7 oz (61 kg)     Height 07/06/23 0944 6\' 1"  (1.854 m)     Head Circumference --      Peak Flow --      Pain Score 07/06/23 0944 0     Pain Loc --      Pain Education --      Exclude from Growth Chart --     Most recent vital signs: Vitals:   07/06/23 1400 07/06/23 1430  BP: (!) 124/92 131/76  Pulse:  74  Resp: (!) 21 (!) 26  Temp:    SpO2:  97%     General: Alert, relatively comfortable appearing, no distress.  CV:  Good peripheral  perfusion.  Resp:  Normal effort.  Abd:  Mild periumbilical abdominal tenderness.  No distention.  Other:  No midline spinal tenderness.  No CVA tenderness.   ED Results / Procedures / Treatments   Labs (all labs ordered are listed, but only abnormal results are displayed) Labs Reviewed  COMPREHENSIVE METABOLIC PANEL WITH GFR - Abnormal; Notable for the following components:      Result Value   Glucose, Bld 277 (*)    BUN 24 (*)    Creatinine, Ser 2.68 (*)    Albumin 2.7 (*)    GFR, Estimated 26 (*)    All other components within normal limits  CBC WITH DIFFERENTIAL/PLATELET - Abnormal; Notable for the following components:   RBC 2.93 (*)    Hemoglobin 8.4 (*)    HCT 27.4 (*)    Platelets 148 (*)    All other components within normal limits  URINALYSIS, ROUTINE W REFLEX MICROSCOPIC - Abnormal; Notable for the following components:   Color, Urine YELLOW (*)    APPearance TURBID (*)  Glucose, UA >=500 (*)    Hgb urine dipstick MODERATE (*)    Protein, ur 100 (*)    Nitrite POSITIVE (*)    Leukocytes,Ua LARGE (*)    Bacteria, UA MANY (*)    All other components within normal limits  LIPASE, BLOOD  TROPONIN I (HIGH SENSITIVITY)  TROPONIN I (HIGH SENSITIVITY)     EKG  ED ECG REPORT I, Lind Repine, the attending physician, personally viewed and interpreted this ECG.  Date: 07/06/2023 EKG Time: 0942 Rate: 81 Rhythm: normal sinus rhythm QRS Axis: normal Intervals: normal ST/T Wave abnormalities: Nonspecific ST abnormalities Narrative Interpretation: no evidence of acute ischemia    RADIOLOGY  Chest x-ray: I independently viewed and interpreted the images; there is a right lower lobe opacity with no obvious focal consolidation or edema  CT abdomen/pelvis:   IMPRESSION:  ABDOMEN:    1. 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 No regional  inflammation or lymphadenopathy. Differential consideration includes  obstructing cystic neoplasm, pseudocyst, choledochal cyst, enteric  cyst.  2. Gallstones within contracted gallbladder. No CT evidence of acute  cholecystitis or hepatic bile duct obstruction.    PELVIS:    1. Bulky tumor versus inflammatory wall thickening at the anterior  base of the urinary bladder, 3.2 cm in thickness. Foley catheter  within the bladder. No regional lymphadenopathy. Kidneys and ureters  appear nonobstructed.    CHEST:    1. Right lower lobe pulmonary ground-glass opacity more suspicious  for infection than atelectasis. No pleural fluid.  2. Elevation of the left hemidiaphragm is new since 2010 and from  portable chest x-ray this year.  3.  Aortic Atherosclerosis (ICD10-I70.0).   PROCEDURES:  Critical Care performed: No  Procedures   MEDICATIONS ORDERED IN ED: Medications  acetaminophen  (TYLENOL ) tablet 650 mg (has no administration in time range)    Or  acetaminophen  (TYLENOL ) suppository 650 mg (has no administration in time range)  ondansetron  (ZOFRAN ) tablet 4 mg (has no administration in time range)    Or  ondansetron  (ZOFRAN ) injection 4 mg (has no administration in time range)  heparin  injection 5,000 Units (has no administration in time range)  senna-docusate (Senokot-S) tablet 1 tablet (has no administration in time range)  hydrALAZINE  (APRESOLINE ) injection 5 mg (has no administration in time range)  dolutegravir -rilpivirine  (JULUCA ) 50-25 MG per tablet 1 tablet (has no administration in time range)  fentaNYL  (SUBLIMAZE ) injection 25 mcg (has no administration in time range)  ondansetron  (ZOFRAN ) injection 4 mg (4 mg Intravenous Given 07/06/23 1029)  HYDROmorphone  (DILAUDID ) injection 0.5 mg (0.5 mg Intravenous Given 07/06/23 1030)  azithromycin  (ZITHROMAX ) 500 mg in sodium  chloride 0.9 % 250 mL IVPB (0 mg Intravenous Stopped 07/06/23 1431)  cefTRIAXone  (ROCEPHIN ) 1 g in sodium chloride  0.9 % 100 mL IVPB (0 g Intravenous Stopped 07/06/23 1431)  HYDROmorphone  (DILAUDID ) injection 0.5 mg (0.5 mg Intravenous Given 07/06/23 1250)     IMPRESSION / MDM / ASSESSMENT AND PLAN / ED COURSE  I reviewed the triage vital signs and the nursing notes.  63 year old male with PMH as noted above presents with abdominal, low back, and chest pain since yesterday.  Differential diagnosis is broad and includes, but is not limited to, UTI/cystitis, pyelonephritis, colitis, diverticulitis, gastroenteritis, PUD, gastritis, pancreatitis, musculoskeletal pain, less likely ACS  or other cardiac etiology.  We will obtain basic labs, cardiac enzymes, LFTs, lipase, chest x-ray, CT abdomen/pelvis, give analgesia, and reassess.  Patient's presentation is most consistent with acute presentation with potential threat to life or bodily function.  The patient is on the cardiac monitor to evaluate for evidence of arrhythmia and/or significant heart rate changes.  ----------------------------------------- 1:08 PM on 07/06/2023 -----------------------------------------  Chest x-ray and CT are suggestive of pneumonia.  Urinalysis shows findings compatible with UTI.  I have ordered empiric antibiotics to cover for both.  CMP and CBC are otherwise unremarkable.  Troponin is negative.  CT abdomen is also significant for a cystic mass in the upper abdomen adjacent to the pancreas and duodenum.  However, I consulted and discussed this with Dr. Mauri Sous from general surgery.  A similar mass was present on previous imaging.  This appears to be chronic.  The patient will need admission for further management.  I consulted Dr. Reinhold Carbine from the hospitalist service; based on our discussion she agrees to evaluate the patient for admission.   FINAL CLINICAL IMPRESSION(S) / ED DIAGNOSES   Final diagnoses:  Cystitis   Community acquired pneumonia, unspecified laterality     Rx / DC Orders   ED Discharge Orders     None        Note:  This document was prepared using Dragon voice recognition software and may include unintentional dictation errors.    Lind Repine, MD 07/06/23 [phone]

## 2023-07-06 NOTE — ED Notes (Signed)
 Advised nurse that paitent has ready bed

## 2023-07-06 NOTE — Plan of Care (Signed)
  Problem: Education: Goal: Knowledge of General Education information will improve Description: Including pain rating scale, medication(s)/side effects and non-pharmacologic comfort measures Outcome: Progressing   Problem: Health Behavior/Discharge Planning: Goal: Ability to manage health-related needs will improve Outcome: Progressing   Problem: Clinical Measurements: Goal: Ability to maintain clinical measurements within normal limits will improve Outcome: Progressing Goal: Will remain free from infection Outcome: Progressing Goal: Diagnostic test results will improve Outcome: Progressing Goal: Respiratory complications will improve Outcome: Progressing Goal: Cardiovascular complication will be avoided Outcome: Progressing   Problem: Activity: Goal: Risk for activity intolerance will decrease Outcome: Progressing   Problem: Nutrition: Goal: Adequate nutrition will be maintained Outcome: Progressing   Problem: Elimination: Goal: Will not experience complications related to bowel motility Outcome: Progressing Goal: Will not experience complications related to urinary retention Outcome: Progressing   Problem: Coping: Goal: Level of anxiety will decrease Outcome: Progressing   Problem: Pain Managment: Goal: General experience of comfort will improve and/or be controlled Outcome: Progressing   Problem: Education: Goal: Ability to describe self-care measures that may prevent or decrease complications (Diabetes Survival Skills Education) will improve Outcome: Progressing Goal: Individualized Educational Video(s) Outcome: Progressing

## 2023-07-06 NOTE — Hospital Course (Addendum)
 Mr. Jay Flores is a 63 year old male with history of HIV, prostate cancer, hypertension, BPH with chronic indwelling Foley catheter, history of Klebsiella pneumonia UTI, bladder tumor, CKD stage IV, presents emergency department for chief concerns of chest pain abdominal pain.  Vitals in the ED showed T of 98.3, rr 25, hr 79, blood pressure 137/77, SpO2 100% on room air.  Serum sodium is 135, potassium 4.4, chloride 103, bicarb 23, BUN of 24, serum creatinine 2.68, nonfasting blood glucose 277, EGFR of 26, WBC 9.9, hemoglobin 8.4, platelets of 148.  HS troponin is 12.  UA was positive for large leukocytes and large nitrate.  ED treatment: Dilaudid  0.5 mg IV x 2, ondansetron  4 mg IV one-time dose, azithromycin  500 mg IV, ceftriaxone  1 g IV.

## 2023-07-07 DIAGNOSIS — J189 Pneumonia, unspecified organism: Secondary | ICD-10-CM | POA: Diagnosis not present

## 2023-07-07 LAB — GLUCOSE, CAPILLARY
Glucose-Capillary: 153 mg/dL — ABNORMAL HIGH (ref 70–99)
Glucose-Capillary: 165 mg/dL — ABNORMAL HIGH (ref 70–99)
Glucose-Capillary: 184 mg/dL — ABNORMAL HIGH (ref 70–99)
Glucose-Capillary: 159 mg/dL — ABNORMAL HIGH (ref 70–99)

## 2023-07-07 LAB — CBC
HCT: 25.6 % — ABNORMAL LOW (ref 39.0–52.0)
Hemoglobin: 8.2 g/dL — ABNORMAL LOW (ref 13.0–17.0)
MCH: 29.2 pg (ref 26.0–34.0)
MCHC: 32 g/dL (ref 30.0–36.0)
MCV: 91.1 fL (ref 80.0–100.0)
Platelets: 141 10*3/uL — ABNORMAL LOW (ref 150–400)
RBC: 2.81 MIL/uL — ABNORMAL LOW (ref 4.22–5.81)
RDW: 13.1 % (ref 11.5–15.5)
WBC: 8.8 10*3/uL (ref 4.0–10.5)
nRBC: 0 % (ref 0.0–0.2)

## 2023-07-07 LAB — BASIC METABOLIC PANEL WITH GFR
Anion gap: 10 (ref 5–15)
BUN: 26 mg/dL — ABNORMAL HIGH (ref 8–23)
CO2: 21 mmol/L — ABNORMAL LOW (ref 22–32)
Calcium: 8.7 mg/dL — ABNORMAL LOW (ref 8.9–10.3)
Chloride: 103 mmol/L (ref 98–111)
Creatinine, Ser: 2.37 mg/dL — ABNORMAL HIGH (ref 0.61–1.24)
GFR, Estimated: 30 mL/min — ABNORMAL LOW (ref >60)
Glucose, Bld: 154 mg/dL — ABNORMAL HIGH (ref 70–99)
Potassium: 4.1 mmol/L (ref 3.5–5.1)
Sodium: 134 mmol/L — ABNORMAL LOW (ref 135–145)

## 2023-07-07 MED ORDER — MEGESTROL ACETATE 20 MG PO TABS
40.0000 mg | ORAL_TABLET | Freq: Two times a day (BID) | ORAL | Status: DC
  Administered 2023-07-07 – 2023-07-08 (×2): 40 mg via ORAL
  Filled 2023-07-07 (×2): qty 2

## 2023-07-07 MED ORDER — CHLORHEXIDINE GLUCONATE CLOTH 2 % EX PADS
6.0000 | MEDICATED_PAD | Freq: Every day | CUTANEOUS | Status: DC
  Administered 2023-07-07 – 2023-07-08 (×2): 6 via TOPICAL

## 2023-07-07 NOTE — Progress Notes (Addendum)
 PROGRESS NOTE    Jay Flores   WGN:[id number] DOB: 01/12/1961  DOA: 07/06/2023 Date of Service: 07/07/23 which is hospital day 1  PCP: [name], MD    Hospital course / significant events:   HPI: Jay Flores is a 63 year old male with history of HIV, prostate cancer, hypertension, BPH with chronic indwelling Foley catheter, history of Klebsiella pneumonia UTI, bladder tumor, CKD stage IV. He resides at [facility]. He presents via EMS to emergency department for chief concerns of chest pain and abdominal pain.  05/31: to ED - RR 25, no other SIRS/Sepsis criteria. Cr 2.68, GFR 26. UA (+)WBC >50. Imaging - question RLL pneumonia/atelectasis, pancreatic cyst concerning possible neoplasm or other cyst, gallstones w/o cholecystitis or obstruction, tumor vs inflammatory wall thickening anterior urinary bladder 3.2 cm thickness 06/01: Cr 2.37, GFR 30. Continue abx, reaching out to urology re: Foley exchange - ok to wait until tomorrow (Mon) and hospitalist will reach out to team tm      Consultants:  none  Procedures/Surgeries: none      ASSESSMENT & PLAN:   Community acquired pneumonia Imaging findings - question RLL pneumonia/atelectasis, clinically significant Antibiotics: Azithromycin  500 mg IV daily, ceftriaxone  2 g IV daily to complete a 5-day course  UTI  On CT imaging: tumor vs inflammatory wall thickening anterior urinary bladder 3.2 cm thickness - likely clinically significant imaging findings  Prostate cancer - s/p gold seed placement 04/12/2023 on 6 months of Eligard  and undergoing IMRT BPH w/ urinary retention  Chronic foley in place  Follows w/ [facility] - cath change in office 05/17/23 and in ED by urology 06/21/23 and DC on Keflex  at that time  Ceftriaxone  2 g IV daily Await UCx  Continue finasteride   Urology consult for catheter exchange - secure chat w/ Dr Valeta Gaudier, confirm likely colonized / can reach out to urology team tomorrow  (Mon) for cath exchange   CKD4 Monitor BMP Avoid nephrotoxins as able  HIV (human immunodeficiency virus infection) (HCC) Resume home antiviral medication, JULUCA  on admission   Pancreatic cyst  Hx chronic pancreatitis  concerning possible neoplasm or other cyst Normal Lipase here, unlikely pancreatitis Follow outpatient  Gallstones w/o cholecystitis or obstruction  Not clinically significant at this time  Follow outpatient  HLD (hyperlipidemia) Home atorvastatin  20 mg nightly resumed   HTN (hypertension) Losartan  100 mg daily, metoprolol succinate 100 mg daily resumed Hydralazine  5 mg IV every 6 hours as needed for SBP greater 170   Type II diabetes mellitus with renal manifestations (HCC) Home metformin held for now given renal function Insulin  SSI with at bedtime coverage ordered   Depression Home Vilazodone 20 mg daily resumed   Chest discomfort - resolved Noncardiac chest discomfort Suspect secondary to pneumonia Treatment per above Symptomatic support: Fentanyl  25 mcg IV every 4 hours.  For moderate pain, 20 hours ordered; Dilaudid  0.5 mg IV every 4 hours as needed for severe pain, 20 hours ordered   Reduced appetite  Denies N/V or dysphagia Added megace Dietician consult   Underweight based on BMI: Body mass index is 17.74 kg/m.[name]  Severe malnutrition Significantly low or high BMI is associated with higher medical risk.  Healthy nutrition and physical activity advised as adjunct to other disease management and risk reduction treatments    DVT prophylaxis: heparin   IV fluids: no continuous IV fluids  Nutrition: cardiac/carb diet Central lines / other devices: Foley (chronic)  Code Status: FULL CODE ACP documentation reviewed: HCPOA Simonne Dubonnet Ray [phone])  caregiver noted on advanced directive dated 01/2022 which is on file in [facility]  [facility] needs: anticipate dc back to previous environment  Medical barriers to dispo: await cultures. Expected medical  readiness for discharge 1-3 days.              Subjective / Brief ROS:  Patient reports low appetite, mild epigastric abd pain but denies nausea/vomiting, tolerating diet and no dysphagia Denies CP/SOB.  Denies new weakness.  Reports no concerns w/ urination/defecation.   Family Communication: none at this time     Objective Findings:  Vitals:   07/06/23 1701 07/06/23 2014 07/07/23 0431 07/07/23 0802  BP: (!) 117/58 119/76 127/82 125/71  Pulse: 83 81 77 71  Resp: 18 18 12 16   Temp: 98.3 F (36.8 C) 99.5 F (37.5 C) 98.5 F (36.9 C) (!) 97.4 F (36.3 C)  TempSrc: Oral Oral Oral Oral  SpO2: 99% 100% 100% 100%  Weight:      Height:        Intake/Output Summary (Last 24 hours) at 07/07/2023 1130 Last data filed at 07/07/2023 0900 Gross per 24 hour  Intake 120 ml  Output 1000 ml  Net -880 ml   Filed Weights   07/06/23 0944  Weight: 61 kg    Examination:  Physical Exam Constitutional:      General: He is not in acute distress. Cardiovascular:     Rate and Rhythm: Normal rate and regular rhythm.     Heart sounds: Normal heart sounds.  Pulmonary:     Effort: Pulmonary effort is normal.     Breath sounds: Normal breath sounds.  Abdominal:     General: Bowel sounds are normal.     Palpations: Abdomen is soft.  Musculoskeletal:     Right lower leg: No edema.     Left lower leg: No edema.  Skin:    General: Skin is warm and dry.  Neurological:     Mental Status: He is alert.  Psychiatric:        Mood and Affect: Mood normal.        Behavior: Behavior normal.          Scheduled Medications:   atorvastatin   20 mg Oral QHS   cholecalciferol  1,000 Units Oral QHS   dolutegravir -rilpivirine   1 tablet Oral Daily   finasteride   5 mg Oral Daily   folic acid   1 mg Oral Daily   heparin   5,000 Units Subcutaneous Q8H   insulin  aspart  0-5 Units Subcutaneous QHS   insulin  aspart  0-9 Units Subcutaneous TID WC   losartan   100 mg Oral Daily   megestrol   40 mg Oral BID   metoprolol succinate  100 mg Oral Daily   sodium bicarbonate   650 mg Oral BID   Vilazodone HCl  20 mg Oral Daily    Continuous Infusions:  azithromycin      cefTRIAXone  (ROCEPHIN )  IV 2 g (07/07/23 0947)    PRN Medications:  acetaminophen  **OR** acetaminophen , hydrALAZINE , ondansetron  **OR** ondansetron  (ZOFRAN ) IV, mouth rinse, senna-docusate  Antimicrobials from admission:  Anti-infectives (From admission, onward)    Start     Dose/Rate Route Frequency Ordered Stop   07/07/23 1100  azithromycin  (ZITHROMAX ) 500 mg in sodium chloride  0.9 % 250 mL IVPB        500 mg 250 mL/hr over 60 Minutes Intravenous Every 24 hours 07/06/23 1504 07/11/23 1059   07/07/23 1000  cefTRIAXone  (ROCEPHIN ) 2 g in sodium chloride  0.9 % 100 mL  IVPB        2 g 200 mL/hr over 30 Minutes Intravenous Every 24 hours 07/06/23 1504 07/11/23 0959   07/06/23 1800  dolutegravir -rilpivirine  (JULUCA ) 50-25 MG per tablet 1 tablet       Note to Pharmacy: Pending med rec. If patient hasn't taken, please resume.   1 tablet Oral Daily 07/06/23 1312     07/06/23 1245  azithromycin  (ZITHROMAX ) 500 mg in sodium chloride  0.9 % 250 mL IVPB        500 mg 250 mL/hr over 60 Minutes Intravenous  Once 07/06/23 1244 07/06/23 1431   07/06/23 1245  cefTRIAXone  (ROCEPHIN ) 1 g in sodium chloride  0.9 % 100 mL IVPB        1 g 200 mL/hr over 30 Minutes Intravenous  Once 07/06/23 1244 07/06/23 1431           Data Reviewed:  I have personally reviewed the following...  CBC: Recent Labs  Lab 07/06/23 0948 07/07/23 0432  WBC 9.9 8.8  NEUTROABS 7.4  --   HGB 8.4* 8.2*  HCT 27.4* 25.6*  MCV 93.5 91.1  PLT 148* 141*   Basic Metabolic Panel: Recent Labs  Lab 07/06/23 0948 07/07/23 0432  NA 135 134*  K 4.4 4.1  CL 103 103  CO2 23 21*  GLUCOSE 277* 154*  BUN 24* 26*  CREATININE 2.68* 2.37*  CALCIUM  8.9 8.7*   GFR: Estimated Creatinine Clearance: 27.9 mL/min (A) (by C-G formula based on SCr of 2.37  mg/dL (H)). Liver Function Tests: Recent Labs  Lab 07/06/23 0948  AST 16  ALT 9  ALKPHOS 61  BILITOT 0.7  PROT 7.6  ALBUMIN 2.7*   Recent Labs  Lab 07/06/23 0948  LIPASE 20   No results for input(s): "AMMONIA" in the last 168 hours. Coagulation Profile: No results for input(s): "INR", "PROTIME" in the last 168 hours. Cardiac Enzymes: No results for input(s): "CKTOTAL", "CKMB", "CKMBINDEX", "TROPONINI" in the last 168 hours. BNP (last 3 results) No results for input(s): "PROBNP" in the last 8760 hours. HbA1C: No results for input(s): "HGBA1C" in the last 72 hours. CBG: Recent Labs  Lab 07/06/23 1658 07/06/23 2121 07/07/23 0759  GLUCAP 171* 206* 153*   Lipid Profile: No results for input(s): "CHOL", "HDL", "LDLCALC", "TRIG", "CHOLHDL", "LDLDIRECT" in the last 72 hours. Thyroid Function Tests: No results for input(s): "TSH", "T4TOTAL", "FREET4", "T3FREE", "THYROIDAB" in the last 72 hours. Anemia Panel: No results for input(s): "VITAMINB12", "FOLATE", "FERRITIN", "TIBC", "IRON", "RETICCTPCT" in the last 72 hours. Most Recent Urinalysis On File:     Component Value Date/Time   COLORURINE YELLOW (A) 07/06/2023 0948   APPEARANCEUR TURBID (A) 07/06/2023 0948   APPEARANCEUR Cloudy (A) 02/11/2023 1123   LABSPEC 1.016 07/06/2023 0948   PHURINE 5.0 07/06/2023 0948   GLUCOSEU >=500 (A) 07/06/2023 0948   HGBUR MODERATE (A) 07/06/2023 0948   BILIRUBINUR NEGATIVE 07/06/2023 0948   BILIRUBINUR Negative 02/11/2023 1123   KETONESUR NEGATIVE 07/06/2023 0948   PROTEINUR 100 (A) 07/06/2023 0948   UROBILINOGEN 1.0 01/23/2012 0602   NITRITE POSITIVE (A) 07/06/2023 0948   LEUKOCYTESUR LARGE (A) 07/06/2023 0948   Sepsis Labs: @LABRCNTIP (procalcitonin:4,lacticidven:4) Microbiology: No results found for this or any previous visit (from the past 240 hours).    Radiology Studies last 3 days: DG Chest 2 View Result Date: 07/06/2023 CLINICAL DATA:  Chest pain.  History of prostate  cancer and AIDS. EXAM: CHEST - 2 VIEW COMPARISON:  04/24/2023 FINDINGS: Normal sized heart. Mild patchy opacity at  the posterior lung bases on the lateral view and mild patchy opacity in the right lower lobe. Mild-to-moderate central peribronchial thickening. Unremarkable bones. IMPRESSION: 1. Mild bibasilar and right lower lobe patchy atelectasis or pneumonia. 2. Mild-to-moderate central bronchitic changes. Electronically Signed   By: Catherin Closs M.D.   On: 07/06/2023 11:09   CT ABDOMEN PELVIS WO CONTRAST Result Date: 07/06/2023 CLINICAL DATA:  63 year old male with acute abdominal pain. EXAM: CT ABDOMEN AND PELVIS WITHOUT CONTRAST TECHNIQUE: Multidetector CT imaging of the abdomen and pelvis was performed following the standard protocol without IV contrast. RADIATION DOSE REDUCTION: This exam was performed according to the departmental dose-optimization program which includes automated exposure control, adjustment of the mA and/or kV according to patient size and/or use of iterative reconstruction technique. COMPARISON:  CT Abdomen 12/21/2008. FINDINGS: Lower chest: Elevation of the left hemidiaphragm is new since 2010, and from portable chest x-ray this year 04/24/2023. No cardiomegaly. No pericardial effusion. Confluent right lower lobe, costophrenic angle pulmonary ground-glass opacity. No other significant lung base opacity. Hepatobiliary: Cholelithiasis within a contracted gallbladder. No pericholecystic inflammation. Negative noncontrast liver. No bile duct enlargement is evident. Pancreas: Highly abnormal. Subtotal pancreatic atrophy superimposed on extensive parenchymal dystrophic calcifications and dilated main pancreatic duct. These findings terminates at the duodenum C-loop where a rounded cystic mass is present on series 2, image 28 and further described below. No peripancreatic inflammation. Spleen: The entire superior pole of the spleen is not included but the visible noncontrast spleen appears  negative. Adrenals/Urinary Tract: Normal adrenal glands. Nonobstructed kidneys. Diminutive ureters. Abnormal urinary bladder. A Foley catheter is within the bladder lumen. The base of the bladder is decompressed but there is severe abnormal anterior bladder wall thickening and a spiculated appearance which is indeterminate for inflammation (series 2, image 66). Abnormal thickening there of slightly over 3 cm as seen on coronal image 30. The dome of the bladder contains gas and is non thickened. See sagittal image 84. Pelvic phleboliths. Stomach/Bowel: Redundant but decompressed large bowel in the pelvis. Mild descending colon retained stool. Moderate transverse colon gaseous distension more so than retained stool. Decompressed flexures. Retained stool in the right colon without abnormal distention. Normal gas containing appendix on coronal image 44. Nondilated small bowel. Stomach is also mostly decompressed. However, there is a rounded 3.4 cm cystic mass with simple fluid density at the gastroduodenal junction, adjacent to abnormal pancreas (series 2, image 28 and coronal image 34), new since 2010. No regional inflammation. However, this may be a portion of the larger cystic mass which has an hourglass configuration (coronal image 33 uncertain). Duodenum is nondilated. No pneumoperitoneum. No free fluid. No mesenteric inflammation identified. Vascular/Lymphatic: Aortoiliac calcified atherosclerosis. Normal caliber abdominal aorta. Vascular patency is not evaluated in the absence of IV contrast. No lymphadenopathy identified. Reproductive: Urethral catheter in place. Other: No pelvis free fluid. Musculoskeletal: No acute or suspicious osseous lesion. IMPRESSION: ABDOMEN: 1. Extensive pancreatic atrophy and dystrophic calcification with dilated main pancreatic duct terminating at a Cystic Mass at the duodenal C-loop (3.4 cm unilocular versus larger hour glass shaped, see coronal images 33 and 34), which is new since  2010. No regional inflammation or lymphadenopathy. Differential consideration includes obstructing cystic neoplasm, pseudocyst, choledochal cyst, enteric cyst. 2. Gallstones within contracted gallbladder. No CT evidence of acute cholecystitis or hepatic bile duct obstruction. PELVIS: 1. Bulky tumor versus inflammatory wall thickening at the anterior base of the urinary bladder, 3.2 cm in thickness. Foley catheter within the bladder. No regional lymphadenopathy. Kidneys and ureters  appear nonobstructed. CHEST: 1. Right lower lobe pulmonary ground-glass opacity more suspicious for infection than atelectasis. No pleural fluid. 2. Elevation of the left hemidiaphragm is new since 2010 and from portable chest x-ray this year. 3.  Aortic Atherosclerosis (ICD10-I70.0). Electronically Signed   By: Marlise Simpers M.D.   On: 07/06/2023 10:34         Ximena Todaro, DO [facility] 07/07/2023, 11:30 AM    Dictation software may have been used to generate the above note. Typos may occur and escape review in typed/dictated notes. Please contact Dr Authur Leghorn directly for clarity if needed.  Staff may message me via secure chat in Epic  but this may not receive an immediate response,  please page me for urgent matters!  If 7PM-7AM, please contact night coverage [email or web address]

## 2023-07-07 NOTE — TOC Initial Note (Addendum)
 Transition of Care [facility]) - Initial/Assessment Note    Patient Details  Name: Jay Flores MRN: [id number] Date of Birth: 01/22/1961  [facility]) CM/SW Contact:    Alexandra Ice, RN Phone Number: 07/07/2023, 1:49 PM  Clinical Narrative:                 Patient was sleeping. Contacted Brandi at [facility]. She confirmed patient was resident there. She stated he needs assistance with bathing, dressing and transfers. He has RW and wheelchair, he is able to move around himself independently in wheelchair. She stated he does not have a legal guardian, and he can make his own decisions. She did state he does have POA. They are able to provide transportation at discharge. TOC will follow for any discharge needs.  Expected Discharge Plan: Group Home Barriers to Discharge: Continued Medical Work up   Patient Goals and CMS Choice            Expected Discharge Plan and Services       Living arrangements for the past 2 months: Group Home [facility])                                      Prior Living Arrangements/Services Living arrangements for the past 2 months: Group Home [facility]) Lives with:: Facility Resident Patient language and need for interpreter reviewed:: Yes Do you feel safe going back to the place where you live?: Yes      Need for Family Participation in Patient Care: No (Comment) Care giver support system in place?: Yes (comment) Current home services: DME (walker and wheelchair) Criminal Activity/Legal Involvement Pertinent to Current Situation/Hospitalization: No - Comment as needed  Activities of Daily Living      Permission Sought/Granted                  Emotional Assessment Appearance:: Appears older than stated age Attitude/Demeanor/Rapport: Lethargic Affect (typically observed): Appropriate Orientation: : Oriented to Self, Oriented to Place, Oriented to  Time Alcohol / Substance  Use: Not Applicable Psych Involvement: No (comment)  Admission diagnosis:  Community acquired pneumonia [J18.9] Cystitis [N30.90] Community acquired pneumonia, unspecified laterality [J18.9] Patient Active Problem List   Diagnosis Date Noted   Community acquired pneumonia 07/06/2023   Chest discomfort 07/06/2023   Depression 07/06/2023   Symptomatic anemia 04/24/2023   HLD (hyperlipidemia) 04/24/2023   Syncope 04/24/2023   Acute renal failure superimposed on stage 3b chronic kidney disease (HCC) 04/24/2023   Type II diabetes mellitus with renal manifestations (HCC) 04/24/2023   Prostate cancer (HCC) 04/24/2023   Elevated lactic acid level 04/24/2023   Pancreatic cyst 03/23/2022   CKD (chronic kidney disease) stage 3, GFR 30-59 ml/min (HCC) 03/23/2022   Thrombocytopenia (HCC) 03/23/2022   Callus 11/23/2021   Pain due to onychomycosis of toenails of both feet 06/15/2021   Diabetic neuropathy (HCC) 06/15/2021   Bladder dysfunction 05/07/2021   H/O urinary retention 05/07/2021   Tobacco use 04/17/2018   DKA (diabetic ketoacidosis) (HCC) 03/29/2017   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   Leg weakness, bilateral 01/18/2016   Difficulty walking 12/19/2015   Hyperreflexia of lower extremity 12/19/2015   Muscle spasm of both lower legs 12/19/2015   HTN (hypertension) 02/19/2012   Weakness 02/19/2012   Metabolic acidosis 01/27/2012   Hypokalemia 01/23/2012   PNA (pneumonia) 01/22/2012   HIV (human immunodeficiency virus infection) (  HCC) 01/22/2012   Brain lesion 01/22/2012   Acute renal failure (HCC) 01/22/2012   Anemia 01/22/2012   Altered mental status 01/22/2012   Hepatitis C infection 10/05/2011   PCP:  [name], MD Pharmacy:   [facility] - [place], [place] - [address] [place] [address] Phone: [phone] Fax: [phone]     Social Drivers of Health (SDOH) Social History: SDOH Screenings   Food Insecurity: No Food  Insecurity (04/25/2023)  Housing: Low Risk  (04/25/2023)  Transportation Needs: No Transportation Needs (04/25/2023)  Utilities: Not At Risk (04/25/2023)  Depression (PHQ2-9): Low Risk  (03/23/2022)  Tobacco Use: High Risk (07/06/2023)   SDOH Interventions:     Readmission Risk Interventions     No data to display

## 2023-07-07 NOTE — Plan of Care (Signed)
  Problem: Health Behavior/Discharge Planning: Goal: Ability to manage health-related needs will improve Outcome: Progressing   Problem: Education: Goal: Knowledge of General Education information will improve Description: Including pain rating scale, medication(s)/side effects and non-pharmacologic comfort measures Outcome: Progressing   Problem: Clinical Measurements: Goal: Ability to maintain clinical measurements within normal limits will improve Outcome: Progressing Goal: Will remain free from infection Outcome: Progressing Goal: Diagnostic test results will improve Outcome: Progressing Goal: Respiratory complications will improve Outcome: Progressing

## 2023-07-07 NOTE — Plan of Care (Signed)
  Problem: Education: Goal: Knowledge of General Education information will improve Description: Including pain rating scale, medication(s)/side effects and non-pharmacologic comfort measures Outcome: Progressing   Problem: Health Behavior/Discharge Planning: Goal: Ability to manage health-related needs will improve Outcome: Progressing   Problem: Clinical Measurements: Goal: Ability to maintain clinical measurements within normal limits will improve Outcome: Progressing Goal: Will remain free from infection Outcome: Progressing Goal: Diagnostic test results will improve Outcome: Progressing Goal: Respiratory complications will improve Outcome: Progressing Goal: Cardiovascular complication will be avoided Outcome: Progressing   Problem: Activity: Goal: Risk for activity intolerance will decrease Outcome: Progressing   Problem: Coping: Goal: Level of anxiety will decrease Outcome: Progressing   Problem: Elimination: Goal: Will not experience complications related to bowel motility Outcome: Progressing Goal: Will not experience complications related to urinary retention Outcome: Progressing   Problem: Skin Integrity: Goal: Risk for impaired skin integrity will decrease Outcome: Progressing   Problem: Coping: Goal: Ability to adjust to condition or change in health will improve Outcome: Progressing   Problem: Fluid Volume: Goal: Ability to maintain a balanced intake and output will improve Outcome: Progressing

## 2023-07-08 ENCOUNTER — Other Ambulatory Visit: Payer: Self-pay

## 2023-07-08 DIAGNOSIS — J189 Pneumonia, unspecified organism: Secondary | ICD-10-CM | POA: Diagnosis not present

## 2023-07-08 LAB — BASIC METABOLIC PANEL WITH GFR
Anion gap: 10 (ref 5–15)
BUN: 28 mg/dL — ABNORMAL HIGH (ref 8–23)
CO2: 20 mmol/L — ABNORMAL LOW (ref 22–32)
Calcium: 9 mg/dL (ref 8.9–10.3)
Chloride: 103 mmol/L (ref 98–111)
Creatinine, Ser: 2.43 mg/dL — ABNORMAL HIGH (ref 0.61–1.24)
GFR, Estimated: 29 mL/min — ABNORMAL LOW (ref >60)
Glucose, Bld: 150 mg/dL — ABNORMAL HIGH (ref 70–99)
Potassium: 4.5 mmol/L (ref 3.5–5.1)
Sodium: 133 mmol/L — ABNORMAL LOW (ref 135–145)

## 2023-07-08 LAB — CBC
HCT: 25.9 % — ABNORMAL LOW (ref 39.0–52.0)
Hemoglobin: 8.2 g/dL — ABNORMAL LOW (ref 13.0–17.0)
MCH: 28.7 pg (ref 26.0–34.0)
MCHC: 31.7 g/dL (ref 30.0–36.0)
MCV: 90.6 fL (ref 80.0–100.0)
Platelets: 143 10*3/uL — ABNORMAL LOW (ref 150–400)
RBC: 2.86 MIL/uL — ABNORMAL LOW (ref 4.22–5.81)
RDW: 13 % (ref 11.5–15.5)
WBC: 9.7 10*3/uL (ref 4.0–10.5)
nRBC: 0 % (ref 0.0–0.2)

## 2023-07-08 LAB — GLUCOSE, CAPILLARY
Glucose-Capillary: 189 mg/dL — ABNORMAL HIGH (ref 70–99)
Glucose-Capillary: 115 mg/dL — ABNORMAL HIGH (ref 70–99)

## 2023-07-08 MED ORDER — AMOXICILLIN-POT CLAVULANATE 875-125 MG PO TABS
1.0000 | ORAL_TABLET | Freq: Two times a day (BID) | ORAL | 0 refills | Status: AC
  Filled 2023-07-08: qty 14, 7d supply, fill #0

## 2023-07-08 MED ORDER — AZITHROMYCIN 250 MG PO TABS
ORAL_TABLET | ORAL | 0 refills | Status: AC
  Filled 2023-07-08: qty 6, 5d supply, fill #0

## 2023-07-08 NOTE — Discharge Summary (Signed)
 Physician Discharge Summary   Patient: Jay Flores MRN: [id number]  DOB: 1960/12/29   Admit:     Date of Admission: 07/06/2023 Admitted from: group home   Discharge: Date of discharge: 07/08/23 Disposition: Group home Condition at discharge: good  CODE STATUS: FULL CODE     Discharge Physician: Melodi Sprung, DO [facility]     PCP: [name], MD  Recommendations for Outpatient Follow-up:  Follow up with PCP [name], MD in 1-2 weeks Follow as scheduled w/ urology     Discharge Instructions     Diet general   Complete by: As directed    Increase activity slowly   Complete by: As directed          Discharge Diagnoses: Principal Problem:   Community acquired pneumonia Active Problems:   HIV (human immunodeficiency virus infection) (HCC)   Prostate cancer (HCC)   Tobacco use   HTN (hypertension)   HLD (hyperlipidemia)   Type II diabetes mellitus with renal manifestations (HCC)   PNA (pneumonia)   HIV dementia (HCC)   Chest discomfort   Depression      Hospital course / significant events:   HPI: Jay Flores is a 63 year old male with history of HIV, prostate cancer, hypertension, BPH with chronic indwelling Foley catheter, history of Klebsiella pneumonia UTI, bladder tumor, CKD stage IV. He resides at [facility]. He presents via EMS to emergency department for chief concerns of chest pain and abdominal pain.  05/31: to ED - RR 25, no other SIRS/Sepsis criteria. Cr 2.68, GFR 26. UA (+)WBC >50. Imaging - question RLL pneumonia/atelectasis, pancreatic cyst concerning possible neoplasm or other cyst, gallstones w/o cholecystitis or obstruction, tumor vs inflammatory wall thickening anterior urinary bladder 3.2 cm thickness 06/01: Cr 2.37, GFR 30. Continue abx, reaching out to urology re: Foley exchange - ok to wait until tomorrow (Mon) and hospitalist will reach out to team tm / once cultures result   06/02: confirmed w/ urology UCx likely reflects colonization and no need to exchange catheter early. Stable for dc to complete po abx for pneumonia     Consultants:  none  Procedures/Surgeries: none      ASSESSMENT & PLAN:   Community acquired pneumonia Imaging findings - question RLL pneumonia/atelectasis, clinically significant Antibiotics: sent home on augmentin + azithro   UTI  On CT imaging: tumor vs inflammatory wall thickening anterior urinary bladder 3.2 cm thickness - likely clinically significant imaging findings  Prostate cancer - s/p gold seed placement 04/12/2023 on 6 months of Eligard  and undergoing IMRT BPH w/ urinary retention  Chronic foley in place  Follows w/ [facility] - cath change in office 05/17/23 and in ED by urology 06/21/23 and DC on Keflex  at that time  Continue finasteride   Urology consult for catheter exchange -  confirmed w/ urology UCx likely reflects colonization and no need to exchange catheter early.  CKD4 Monitor BMP Avoid nephrotoxins as able  HIV (human immunodeficiency virus infection) (HCC) continue home antiviral medication   Pancreatic cyst  Hx chronic pancreatitis  concerning possible neoplasm or other cyst Normal Lipase here, unlikely pancreatitis Follow outpatient  Gallstones w/o cholecystitis or obstruction  Not clinically significant at this time  Follow outpatient  HLD (hyperlipidemia) Home atorvastatin  20 mg nightly resumed   HTN (hypertension) Losartan  100 mg daily, metoprolol succinate 100 mg daily resumed Hydralazine  5 mg IV every 6 hours as needed for SBP greater 170   Type II diabetes  mellitus with renal manifestations (HCC) Home metformin held for now given renal function Insulin  SSI with at bedtime coverage ordered   Depression Home Vilazodone 20 mg daily resumed   Chest discomfort - resolved Noncardiac chest discomfort Suspect secondary to pneumonia Treatment per above Symptomatic support:  Fentanyl  25 mcg IV every 4 hours.  For moderate pain, 20 hours ordered; Dilaudid  0.5 mg IV every 4 hours as needed for severe pain, 20 hours ordered   Reduced appetite  Denies N/V or dysphagia Added megace Dietician consult   Underweight based on BMI: Body mass index is 17.74 kg/m.[name]  Severe malnutrition Significantly low or high BMI is associated with higher medical risk.  Healthy nutrition and physical activity advised as adjunct to other disease management and risk reduction treatments            Discharge Instructions  Allergies as of 07/08/2023   No Known Allergies      Medication List     STOP taking these medications    cefdinir  300 MG capsule Commonly known as: OMNICEF        TAKE these medications    Accu-Chek Aviva Plus test strip Generic drug: glucose blood 1 each by Other route in the morning, at noon, and at bedtime. Use as instructed   Accu-Chek Softclix Lancets lancets by Other route. Use as instructed   amoxicillin -clavulanate 875-125 MG tablet Commonly known as: AUGMENTIN Take 1 tablet by mouth 2 (two) times daily for 7 days. Start taking on: July 09, 2023   atorvastatin  20 MG tablet Commonly known as: LIPITOR Take 20 mg by mouth at bedtime.   azithromycin  250 MG tablet Commonly known as: Zithromax  Take 1 tablet (250 mg total) by mouth daily for 4 days. 2 tabs po on Day 1, then 1 tab daily Days 2 - 5 Start taking on: July 09, 2023   cholecalciferol 25 MCG (1000 UNIT) tablet Commonly known as: VITAMIN D3 Take 1,000 Units by mouth at bedtime.   finasteride  5 MG tablet Commonly known as: PROSCAR  Take 5 mg by mouth daily.   folic acid  1 MG tablet Commonly known as: FOLVITE  Take 1 tablet (1 mg total) by mouth daily.   FreeStyle Libre 14 Day Reader Seymour Dapper Apply topically.   FreeStyle Libre 14 Day Sensor Misc Apply topically.   Jardiance 25 MG Tabs tablet Generic drug: empagliflozin Take 25 mg by mouth in the morning.   Juluca   50-25 MG tablet Generic drug: dolutegravir -rilpivirine  Take 1 tablet by mouth daily.   loperamide 2 MG capsule Commonly known as: IMODIUM Take 2 mg by mouth 4 (four) times daily as needed for diarrhea or loose stools.   losartan  100 MG tablet Commonly known as: COZAAR  Take 100 mg by mouth daily. What changed: Another medication with the same name was removed. Continue taking this medication, and follow the directions you see here.   metFORMIN 500 MG tablet Commonly known as: GLUCOPHAGE Take 500 mg by mouth daily with breakfast.   metoprolol succinate 100 MG 24 hr tablet Commonly known as: TOPROL-XL Take 100 mg by mouth daily.   neomycin-bacitracin-polymyxin [id number] Oint Apply 1 Application topically daily.   nicotine  21 mg/24hr patch Commonly known as: NICODERM CQ  - dosed in mg/24 hours Place 1 patch (21 mg total) onto the skin daily.   sodium bicarbonate  650 MG tablet Take 650 mg by mouth 2 (two) times daily.   sucralfate 1 g tablet Commonly known as: CARAFATE Take 1 g by mouth 2 (two) times daily.  Tylenol  8 Hour Arthritis Pain 650 MG CR tablet Generic drug: acetaminophen  Take 650 mg by mouth every 4 (four) hours as needed for pain.   Vilazodone HCl 20 MG Tabs Take 20 mg by mouth daily.          No Known Allergies   Subjective: pt has no complaints this morning, tolerating diet, pain controlled   Discharge Exam: BP 117/64 (BP Location: Right Arm)   Pulse 80   Temp 98.2 F (36.8 C)   Resp 16   Ht 6\' 1"  (1.854 m)   Wt 61 kg   SpO2 100%   BMI 17.74 kg/m  General: Pt is alert, awake, not in acute distress Cardiovascular: RRR, S1/S2 +, no rubs, no gallops Respiratory: CTA bilaterally, no wheezing, no rhonchi Abdominal: Soft, NT, ND, bowel sounds + Extremities: no edema, no cyanosis     The results of significant diagnostics from this hospitalization (including imaging, microbiology, ancillary and laboratory) are listed below for reference.      Microbiology: No results found for this or any previous visit (from the past 240 hours).   Labs: BNP (last 3 results) No results for input(s): "BNP" in the last 8760 hours. Basic Metabolic Panel: Recent Labs  Lab 07/06/23 0948 07/07/23 0432 07/08/23 0349  NA 135 134* 133*  K 4.4 4.1 4.5  CL 103 103 103  CO2 23 21* 20*  GLUCOSE 277* 154* 150*  BUN 24* 26* 28*  CREATININE 2.68* 2.37* 2.43*  CALCIUM  8.9 8.7* 9.0   Liver Function Tests: Recent Labs  Lab 07/06/23 0948  AST 16  ALT 9  ALKPHOS 61  BILITOT 0.7  PROT 7.6  ALBUMIN 2.7*   Recent Labs  Lab 07/06/23 0948  LIPASE 20   No results for input(s): "AMMONIA" in the last 168 hours. CBC: Recent Labs  Lab 07/06/23 0948 07/07/23 0432 07/08/23 0349  WBC 9.9 8.8 9.7  NEUTROABS 7.4  --   --   HGB 8.4* 8.2* 8.2*  HCT 27.4* 25.6* 25.9*  MCV 93.5 91.1 90.6  PLT 148* 141* 143*   Cardiac Enzymes: No results for input(s): "CKTOTAL", "CKMB", "CKMBINDEX", "TROPONINI" in the last 168 hours. BNP: Invalid input(s): "POCBNP" CBG: Recent Labs  Lab 07/07/23 1151 07/07/23 1723 07/07/23 1924 07/08/23 0757 07/08/23 1124  GLUCAP 165* 184* 159* 189* 115*   D-Dimer No results for input(s): "DDIMER" in the last 72 hours. Hgb A1c No results for input(s): "HGBA1C" in the last 72 hours. Lipid Profile No results for input(s): "CHOL", "HDL", "LDLCALC", "TRIG", "CHOLHDL", "LDLDIRECT" in the last 72 hours. Thyroid function studies No results for input(s): "TSH", "T4TOTAL", "T3FREE", "THYROIDAB" in the last 72 hours.  Invalid input(s): "FREET3" Anemia work up No results for input(s): "VITAMINB12", "FOLATE", "FERRITIN", "TIBC", "IRON", "RETICCTPCT" in the last 72 hours. Urinalysis    Component Value Date/Time   COLORURINE YELLOW (A) 07/06/2023 0948   APPEARANCEUR TURBID (A) 07/06/2023 0948   APPEARANCEUR Cloudy (A) 02/11/2023 1123   LABSPEC 1.016 07/06/2023 0948   PHURINE 5.0 07/06/2023 0948   GLUCOSEU >=500 (A)  07/06/2023 0948   HGBUR MODERATE (A) 07/06/2023 0948   BILIRUBINUR NEGATIVE 07/06/2023 0948   BILIRUBINUR Negative 02/11/2023 1123   KETONESUR NEGATIVE 07/06/2023 0948   PROTEINUR 100 (A) 07/06/2023 0948   UROBILINOGEN 1.0 01/23/2012 0602   NITRITE POSITIVE (A) 07/06/2023 0948   LEUKOCYTESUR LARGE (A) 07/06/2023 0948   Sepsis Labs Recent Labs  Lab 07/06/23 0948 07/07/23 0432 07/08/23 0349  WBC 9.9 8.8 9.7  Microbiology No results found for this or any previous visit (from the past 240 hours). Imaging DG Chest 2 View Result Date: 07/06/2023 CLINICAL DATA:  Chest pain.  History of prostate cancer and AIDS. EXAM: CHEST - 2 VIEW COMPARISON:  04/24/2023 FINDINGS: Normal sized heart. Mild patchy opacity at the posterior lung bases on the lateral view and mild patchy opacity in the right lower lobe. Mild-to-moderate central peribronchial thickening. Unremarkable bones. IMPRESSION: 1. Mild bibasilar and right lower lobe patchy atelectasis or pneumonia. 2. Mild-to-moderate central bronchitic changes. Electronically Signed   By: Catherin Closs M.D.   On: 07/06/2023 11:09   CT ABDOMEN PELVIS WO CONTRAST Result Date: 07/06/2023 CLINICAL DATA:  63 year old male with acute abdominal pain. EXAM: CT ABDOMEN AND PELVIS WITHOUT CONTRAST TECHNIQUE: Multidetector CT imaging of the abdomen and pelvis was performed following the standard protocol without IV contrast. RADIATION DOSE REDUCTION: This exam was performed according to the departmental dose-optimization program which includes automated exposure control, adjustment of the mA and/or kV according to patient size and/or use of iterative reconstruction technique. COMPARISON:  CT Abdomen 12/21/2008. FINDINGS: Lower chest: Elevation of the left hemidiaphragm is new since 2010, and from portable chest x-ray this year 04/24/2023. No cardiomegaly. No pericardial effusion. Confluent right lower lobe, costophrenic angle pulmonary ground-glass opacity. No other  significant lung base opacity. Hepatobiliary: Cholelithiasis within a contracted gallbladder. No pericholecystic inflammation. Negative noncontrast liver. No bile duct enlargement is evident. Pancreas: Highly abnormal. Subtotal pancreatic atrophy superimposed on extensive parenchymal dystrophic calcifications and dilated main pancreatic duct. These findings terminates at the duodenum C-loop where a rounded cystic mass is present on series 2, image 28 and further described below. No peripancreatic inflammation. Spleen: The entire superior pole of the spleen is not included but the visible noncontrast spleen appears negative. Adrenals/Urinary Tract: Normal adrenal glands. Nonobstructed kidneys. Diminutive ureters. Abnormal urinary bladder. A Foley catheter is within the bladder lumen. The base of the bladder is decompressed but there is severe abnormal anterior bladder wall thickening and a spiculated appearance which is indeterminate for inflammation (series 2, image 66). Abnormal thickening there of slightly over 3 cm as seen on coronal image 30. The dome of the bladder contains gas and is non thickened. See sagittal image 84. Pelvic phleboliths. Stomach/Bowel: Redundant but decompressed large bowel in the pelvis. Mild descending colon retained stool. Moderate transverse colon gaseous distension more so than retained stool. Decompressed flexures. Retained stool in the right colon without abnormal distention. Normal gas containing appendix on coronal image 44. Nondilated small bowel. Stomach is also mostly decompressed. However, there is a rounded 3.4 cm cystic mass with simple fluid density at the gastroduodenal junction, adjacent to abnormal pancreas (series 2, image 28 and coronal image 34), new since 2010. No regional inflammation. However, this may be a portion of the larger cystic mass which has an hourglass configuration (coronal image 33 uncertain). Duodenum is nondilated. No pneumoperitoneum. No free fluid.  No mesenteric inflammation identified. Vascular/Lymphatic: Aortoiliac calcified atherosclerosis. Normal caliber abdominal aorta. Vascular patency is not evaluated in the absence of IV contrast. No lymphadenopathy identified. Reproductive: Urethral catheter in place. Other: No pelvis free fluid. Musculoskeletal: No acute or suspicious osseous lesion. IMPRESSION: ABDOMEN: 1. Extensive pancreatic atrophy and dystrophic calcification with dilated main pancreatic duct terminating at a Cystic Mass at the duodenal C-loop (3.4 cm unilocular versus larger hour glass shaped, see coronal images 33 and 34), which is new since 2010. No regional inflammation or lymphadenopathy. Differential consideration includes obstructing cystic neoplasm, pseudocyst,  choledochal cyst, enteric cyst. 2. Gallstones within contracted gallbladder. No CT evidence of acute cholecystitis or hepatic bile duct obstruction. PELVIS: 1. Bulky tumor versus inflammatory wall thickening at the anterior base of the urinary bladder, 3.2 cm in thickness. Foley catheter within the bladder. No regional lymphadenopathy. Kidneys and ureters appear nonobstructed. CHEST: 1. Right lower lobe pulmonary ground-glass opacity more suspicious for infection than atelectasis. No pleural fluid. 2. Elevation of the left hemidiaphragm is new since 2010 and from portable chest x-ray this year. 3.  Aortic Atherosclerosis (ICD10-I70.0). Electronically Signed   By: Marlise Simpers M.D.   On: 07/06/2023 10:34      Time coordinating discharge: over 30 minutes  SIGNED:  Barba Solt DO [facility]

## 2023-07-08 NOTE — TOC Progression Note (Signed)
 Transition of Care [facility]) - Progression Note    Patient Details  Name: Jay Flores MRN: [id number] Date of Birth: [birth year]  Transition of Care [facility]) CM/SW Contact  Elsie Halo, RN Phone Number: 07/08/2023, 11:44 AM  Clinical Narrative:     TOC spoke with Jay Flores [phone]  at the group home, they can accept the patient back today. Jay Flores will arrange for transport when he is dc'd.  TOC will fax DC Summary to [phone].   Expected Discharge Plan: Group Home Barriers to Discharge: Continued Medical Work up  Expected Discharge Plan and Services       Living arrangements for the past 2 months: Group Home [facility])                                       Social Determinants of Health (SDOH) Interventions SDOH Screenings   Food Insecurity: No Food Insecurity (07/07/2023)  Housing: Low Risk  (07/07/2023)  Transportation Needs: No Transportation Needs (07/07/2023)  Utilities: Not At Risk (07/07/2023)  Depression (PHQ2-9): Low Risk  (03/23/2022)  Tobacco Use: High Risk (07/06/2023)    Readmission Risk Interventions     No data to display

## 2023-07-08 NOTE — Progress Notes (Signed)
 Initial Nutrition Assessment  DOCUMENTATION CODES:   Underweight, Severe malnutrition in context of chronic illness  INTERVENTION:   -Glucerna Shake po TID, each supplement provides 220 kcal and 10 grams of protein -MVI with minerals daily -Liberalize diet to carb modified for wider variety of meal selections  NUTRITION DIAGNOSIS:   Severe Malnutrition related to chronic illness (HIV) as evidenced by moderate fat depletion, severe fat depletion, moderate muscle depletion, severe muscle depletion.  GOAL:   Patient will meet greater than or equal to 90% of their needs  MONITOR:   PO intake, Supplement acceptance  REASON FOR ASSESSMENT:   Consult Assessment of nutrition requirement/status  ASSESSMENT:   Pt with history of HIV, prostate cancer, hypertension, BPH with chronic indwelling Foley catheter, history of Klebsiella pneumonia UTI, bladder tumor, CKD stage IV, presents  for chief concerns of chest pain abdominal pain.  Pt admitted with CAP.   Reviewed I/O's: -2.6 L x 24 hours and -3.6 L since admission  UOP: 3 L x 24 hours  Spoke with pt at bedside, who reports feeling better today. Noted pt consumed a bag of potato chips without difficulty. Pt reports he has not been eating, as "I don't eat anything that comes on a dietary tray". RN reports pt consumed 100% of breakfast (sausage and pancakes) and 100% of lunch (roast beef and mac and cheese).   Pt reports good appetite and consumes 2-3 meals per day. He likes the food at facility but does not provide a diet recall; "we're not going to talk about that".   Pt denies any weight loss. Reviewed wt hx; pt has experienced a 1.1% wt loss over the past 6 months, which is not significant for time frame.   Discussed importance of good meal and supplement intake to promote healing. Pt amenable to supplements.   Per RN, likely discharge back to group home today.   Medications reviewed and include vitamin D3, folic acid ,  megestrol, and sodium bicarbonate .   Lab Results  Component Value Date   HGBA1C 6.3 (H) 02/21/2023   PTA DM medications are 500 mg metformin daily.   Labs reviewed: CBGS: 115-189 (inpatient orders for glycemic control are 0-5 units insulin  aspart daily at bedtime and 0-9 units insulin  aspart TID with meals).    NUTRITION - FOCUSED PHYSICAL EXAM:  Flowsheet Row Most Recent Value  Orbital Region Severe depletion  Upper Arm Region Moderate depletion  Thoracic and Lumbar Region Moderate depletion  Buccal Region Severe depletion  Temple Region Severe depletion  Clavicle Bone Region Severe depletion  Clavicle and Acromion Bone Region Severe depletion  Scapular Bone Region Severe depletion  Dorsal Hand Severe depletion  Patellar Region Severe depletion  Anterior Thigh Region Severe depletion  Posterior Calf Region Severe depletion  Edema (RD Assessment) None  Hair Reviewed  Eyes Reviewed  Mouth Reviewed  Skin Reviewed  Nails Reviewed       Diet Order:   Diet Order             Diet general           Diet heart healthy/carb modified Room service appropriate? Yes; Fluid consistency: Thin  Diet effective now                   EDUCATION NEEDS:   Education needs have been addressed  Skin:  Skin Assessment: Reviewed RN Assessment  Last BM:  Unknown  Height:   Ht Readings from Last 1 Encounters:  07/06/23 6\' 1"  (1.854 m)  Weight:   Wt Readings from Last 1 Encounters:  07/06/23 61 kg    Ideal Body Weight:  83.6 kg  BMI:  Body mass index is 17.74 kg/m.  Estimated Nutritional Needs:   Kcal:  1850-2050  Protein:  100-115 grams  Fluid:  1.8-2.0 L    Herschel Lords, RD, LDN, CDCES Registered Dietitian III Certified Diabetes Care and Education Specialist If unable to reach this RD, please use "RD Inpatient" group chat on secure chat between hours of 8am-4 pm daily

## 2023-07-08 NOTE — TOC Transition Note (Addendum)
 Transition of Care [facility]) - Discharge Note   Patient Details  Name: Jay Flores MRN: [id number] Date of Birth: 30-May-1960  Transition of Care [facility]) CM/SW Contact:  Elsie Halo, RN Phone Number: 07/08/2023, 1:43 PM   Clinical Narrative:     The patient is medically clear to discharge back to [facility]. TOC spoke with [name] from the group home and they are arranging transportation and will call the floor when transportation is outside.  TOC faxed DC summary to [facility] at  [phone] No other TOC needs identified.    Barriers to Discharge: Continued Medical Work up   Patient Goals and CMS Choice            Discharge Placement                       Discharge Plan and Services Additional resources added to the After Visit Summary for                                       Social Drivers of Health (SDOH) Interventions SDOH Screenings   Food Insecurity: No Food Insecurity (07/07/2023)  Housing: Low Risk  (07/07/2023)  Transportation Needs: No Transportation Needs (07/07/2023)  Utilities: Not At Risk (07/07/2023)  Depression (PHQ2-9): Low Risk  (03/23/2022)  Tobacco Use: High Risk (07/06/2023)     Readmission Risk Interventions     No data to display

## 2023-07-15 ENCOUNTER — Other Ambulatory Visit: Payer: Self-pay | Admitting: *Deleted

## 2023-07-15 ENCOUNTER — Ambulatory Visit
Admission: RE | Admit: 2023-07-15 | Discharge: 2023-07-15 | Disposition: A | Discharge status: Home / Self Care | Source: Ambulatory Visit | Attending: Radiation Oncology | Admitting: Radiation Oncology

## 2023-07-15 ENCOUNTER — Encounter: Payer: Self-pay | Admitting: Radiation Oncology

## 2023-07-15 VITALS — BP 107/65 | HR 71 | Temp 98.0°F | Resp 16

## 2023-07-15 DIAGNOSIS — C61 Malignant neoplasm of prostate: Secondary | ICD-10-CM | POA: Insufficient documentation

## 2023-07-15 NOTE — Progress Notes (Signed)
 Radiation Oncology Follow up Note  Name: Jay Flores   Date:   07/15/2023 MRN:  [id number] DOB: February 18, 1960    This 63 y.o. 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  REFERRING PROVIDER: [name], MD  HPI: Patient is a 63 year old male now out 1 month having completed image guided IMRT radiation therapy for Gleason 7 adenocarcinoma the prostate.  Seen today in follow-up he is doing well he does have a indwelling catheter which is functioning well he is without complaint he states his bowels are doing well..  COMPLICATIONS OF TREATMENT: none  FOLLOW UP COMPLIANCE: keeps appointments   PHYSICAL EXAM:  BP 107/65   Pulse 71   Temp 98 F (36.7 C) (Tympanic)   Resp 16  Wheelchair-bound male in NAD Foley catheter appears to be doing well clear urine well-developed well-nourished patient in NAD. HEENT reveals PERLA, EOMI, discs not visualized.  Oral cavity is clear. No oral mucosal lesions are identified. Neck is clear without evidence of cervical or supraclavicular adenopathy. Lungs are clear to A&P. Cardiac examination is essentially unremarkable with regular rate and rhythm without murmur rub or thrill. Abdomen is benign with no organomegaly or masses noted. Motor sensory and DTR levels are equal and symmetric in the upper and lower extremities. Cranial nerves II through XII are grossly intact. Proprioception is intact. No peripheral adenopathy or edema is identified. No motor or sensory levels are noted. Crude visual fields are within normal range.  RADIOLOGY RESULTS: No current films for review  PLAN: Present time patient is doing well from a clinical standpoint.  I will see him back in 4 months with repeat PSA and then turn follow-up care over to medical oncology based on the patient's difficulty with  transportation overall comorbidities.  Patient understands my recommendations well.  I would like to take this opportunity to thank you for allowing me to participate in the care of your patient.Glenis Langdon, MD

## 2023-07-22 ENCOUNTER — Ambulatory Visit (INDEPENDENT_AMBULATORY_CARE_PROVIDER_SITE_OTHER): Admitting: Physician Assistant

## 2023-07-22 DIAGNOSIS — Z466 Encounter for fitting and adjustment of urinary device: Secondary | ICD-10-CM | POA: Diagnosis not present

## 2023-07-22 NOTE — Progress Notes (Signed)
 Cath Change/ Replacement  Patient is present today for a catheter change due to urinary retention.  8ml of water was removed from the balloon, a 18FR coude foley cath was removed without difficulty.  Patient was cleaned and prepped in a sterile fashion with betadine and 2% lidocaine  jelly was instilled into the urethra. A 18 FR coude foley cath was replaced into the bladder, no complications were noted. Urine return was not noted but catheter was hubbedr. The balloon was filled with 10ml of sterile water. A night bag was attached for drainage.  Patient tolerated well.    Performed by: Mahonri Seiden, PA-C and Anastasiya Hopkins, CMA  Follow up: Return in about 4 weeks (around 08/19/2023) for Catheter exchange.

## 2023-07-29 ENCOUNTER — Other Ambulatory Visit: Payer: Self-pay

## 2023-07-29 ENCOUNTER — Inpatient Hospital Stay
Admission: EM | Admit: 2023-07-29 | Discharge: 2023-08-03 | DRG: 175 | Disposition: A | Discharge status: Home / Self Care | Attending: Internal Medicine | Admitting: Internal Medicine

## 2023-07-29 ENCOUNTER — Inpatient Hospital Stay

## 2023-07-29 ENCOUNTER — Emergency Department

## 2023-07-29 ENCOUNTER — Encounter: Payer: Self-pay | Admitting: Intensive Care

## 2023-07-29 DIAGNOSIS — C61 Malignant neoplasm of prostate: Secondary | ICD-10-CM | POA: Diagnosis present

## 2023-07-29 DIAGNOSIS — Z79899 Other long term (current) drug therapy: Secondary | ICD-10-CM

## 2023-07-29 DIAGNOSIS — B20 Human immunodeficiency virus [HIV] disease: Secondary | ICD-10-CM | POA: Diagnosis present

## 2023-07-29 DIAGNOSIS — F17210 Nicotine dependence, cigarettes, uncomplicated: Secondary | ICD-10-CM | POA: Diagnosis present

## 2023-07-29 DIAGNOSIS — I82412 Acute embolism and thrombosis of left femoral vein: Secondary | ICD-10-CM | POA: Diagnosis present

## 2023-07-29 DIAGNOSIS — D72829 Elevated white blood cell count, unspecified: Secondary | ICD-10-CM | POA: Diagnosis present

## 2023-07-29 DIAGNOSIS — F32A Depression, unspecified: Secondary | ICD-10-CM | POA: Diagnosis present

## 2023-07-29 DIAGNOSIS — E785 Hyperlipidemia, unspecified: Secondary | ICD-10-CM | POA: Diagnosis present

## 2023-07-29 DIAGNOSIS — F0283 Dementia in other diseases classified elsewhere, unspecified severity, with mood disturbance: Secondary | ICD-10-CM | POA: Diagnosis present

## 2023-07-29 DIAGNOSIS — E1140 Type 2 diabetes mellitus with diabetic neuropathy, unspecified: Secondary | ICD-10-CM | POA: Diagnosis present

## 2023-07-29 DIAGNOSIS — D649 Anemia, unspecified: Secondary | ICD-10-CM | POA: Diagnosis present

## 2023-07-29 DIAGNOSIS — N401 Enlarged prostate with lower urinary tract symptoms: Secondary | ICD-10-CM | POA: Diagnosis present

## 2023-07-29 DIAGNOSIS — I824Y2 Acute embolism and thrombosis of unspecified deep veins of left proximal lower extremity: Secondary | ICD-10-CM | POA: Diagnosis present

## 2023-07-29 DIAGNOSIS — N184 Chronic kidney disease, stage 4 (severe): Secondary | ICD-10-CM | POA: Diagnosis present

## 2023-07-29 DIAGNOSIS — B1920 Unspecified viral hepatitis C without hepatic coma: Secondary | ICD-10-CM | POA: Diagnosis present

## 2023-07-29 DIAGNOSIS — Z681 Body mass index (BMI) 19 or less, adult: Secondary | ICD-10-CM

## 2023-07-29 DIAGNOSIS — E1122 Type 2 diabetes mellitus with diabetic chronic kidney disease: Secondary | ICD-10-CM | POA: Diagnosis present

## 2023-07-29 DIAGNOSIS — Z7901 Long term (current) use of anticoagulants: Secondary | ICD-10-CM | POA: Diagnosis not present

## 2023-07-29 DIAGNOSIS — K862 Cyst of pancreas: Secondary | ICD-10-CM | POA: Diagnosis present

## 2023-07-29 DIAGNOSIS — Z8619 Personal history of other infectious and parasitic diseases: Secondary | ICD-10-CM

## 2023-07-29 DIAGNOSIS — I2699 Other pulmonary embolism without acute cor pulmonale: Secondary | ICD-10-CM | POA: Diagnosis present

## 2023-07-29 DIAGNOSIS — Z923 Personal history of irradiation: Secondary | ICD-10-CM

## 2023-07-29 DIAGNOSIS — Z7984 Long term (current) use of oral hypoglycemic drugs: Secondary | ICD-10-CM

## 2023-07-29 DIAGNOSIS — Z720 Tobacco use: Secondary | ICD-10-CM | POA: Diagnosis present

## 2023-07-29 DIAGNOSIS — E43 Unspecified severe protein-calorie malnutrition: Secondary | ICD-10-CM | POA: Diagnosis present

## 2023-07-29 DIAGNOSIS — Z21 Asymptomatic human immunodeficiency virus [HIV] infection status: Secondary | ICD-10-CM | POA: Diagnosis present

## 2023-07-29 DIAGNOSIS — I82452 Acute embolism and thrombosis of left peroneal vein: Secondary | ICD-10-CM | POA: Diagnosis present

## 2023-07-29 DIAGNOSIS — E119 Type 2 diabetes mellitus without complications: Secondary | ICD-10-CM

## 2023-07-29 DIAGNOSIS — R918 Other nonspecific abnormal finding of lung field: Secondary | ICD-10-CM | POA: Insufficient documentation

## 2023-07-29 DIAGNOSIS — D696 Thrombocytopenia, unspecified: Secondary | ICD-10-CM | POA: Diagnosis present

## 2023-07-29 DIAGNOSIS — R5381 Other malaise: Secondary | ICD-10-CM | POA: Diagnosis present

## 2023-07-29 DIAGNOSIS — R079 Chest pain, unspecified: Secondary | ICD-10-CM | POA: Diagnosis present

## 2023-07-29 DIAGNOSIS — Z8701 Personal history of pneumonia (recurrent): Secondary | ICD-10-CM

## 2023-07-29 DIAGNOSIS — I214 Non-ST elevation (NSTEMI) myocardial infarction: Principal | ICD-10-CM | POA: Diagnosis present

## 2023-07-29 DIAGNOSIS — I129 Hypertensive chronic kidney disease with stage 1 through stage 4 chronic kidney disease, or unspecified chronic kidney disease: Secondary | ICD-10-CM | POA: Diagnosis present

## 2023-07-29 DIAGNOSIS — N138 Other obstructive and reflux uropathy: Secondary | ICD-10-CM | POA: Diagnosis present

## 2023-07-29 DIAGNOSIS — N1831 Type 2 diabetes mellitus with diabetic chronic kidney disease: Secondary | ICD-10-CM

## 2023-07-29 DIAGNOSIS — Z833 Family history of diabetes mellitus: Secondary | ICD-10-CM

## 2023-07-29 DIAGNOSIS — R2689 Other abnormalities of gait and mobility: Secondary | ICD-10-CM | POA: Diagnosis present

## 2023-07-29 DIAGNOSIS — J449 Chronic obstructive pulmonary disease, unspecified: Secondary | ICD-10-CM | POA: Diagnosis present

## 2023-07-29 DIAGNOSIS — I2489 Other forms of acute ischemic heart disease: Secondary | ICD-10-CM | POA: Diagnosis present

## 2023-07-29 DIAGNOSIS — R531 Weakness: Secondary | ICD-10-CM

## 2023-07-29 DIAGNOSIS — R319 Hematuria, unspecified: Secondary | ICD-10-CM | POA: Diagnosis present

## 2023-07-29 DIAGNOSIS — I82432 Acute embolism and thrombosis of left popliteal vein: Secondary | ICD-10-CM | POA: Diagnosis present

## 2023-07-29 DIAGNOSIS — D631 Anemia in chronic kidney disease: Secondary | ICD-10-CM | POA: Diagnosis present

## 2023-07-29 DIAGNOSIS — Z8546 Personal history of malignant neoplasm of prostate: Secondary | ICD-10-CM

## 2023-07-29 DIAGNOSIS — E1129 Type 2 diabetes mellitus with other diabetic kidney complication: Secondary | ICD-10-CM | POA: Diagnosis present

## 2023-07-29 DIAGNOSIS — I10 Essential (primary) hypertension: Secondary | ICD-10-CM | POA: Diagnosis present

## 2023-07-29 LAB — CBC
HCT: 30.8 % — ABNORMAL LOW (ref 39.0–52.0)
Hemoglobin: 9.1 g/dL — ABNORMAL LOW (ref 13.0–17.0)
MCH: 28.3 pg (ref 26.0–34.0)
MCHC: 29.5 g/dL — ABNORMAL LOW (ref 30.0–36.0)
MCV: 95.7 fL (ref 80.0–100.0)
Platelets: 162 10*3/uL (ref 150–400)
RBC: 3.22 MIL/uL — ABNORMAL LOW (ref 4.22–5.81)
RDW: 14.3 % (ref 11.5–15.5)
WBC: 12.4 10*3/uL — ABNORMAL HIGH (ref 4.0–10.5)
nRBC: 0 % (ref 0.0–0.2)

## 2023-07-29 LAB — BASIC METABOLIC PANEL WITH GFR
Anion gap: 9 (ref 5–15)
BUN: 33 mg/dL — ABNORMAL HIGH (ref 8–23)
CO2: 22 mmol/L (ref 22–32)
Calcium: 9.4 mg/dL (ref 8.9–10.3)
Chloride: 110 mmol/L (ref 98–111)
Creatinine, Ser: 2.83 mg/dL — ABNORMAL HIGH (ref 0.61–1.24)
GFR, Estimated: 24 mL/min — ABNORMAL LOW (ref >60)
Glucose, Bld: 218 mg/dL — ABNORMAL HIGH (ref 70–99)
Potassium: 4.4 mmol/L (ref 3.5–5.1)
Sodium: 141 mmol/L (ref 135–145)

## 2023-07-29 LAB — CBG MONITORING, ED: Glucose-Capillary: 154 mg/dL — ABNORMAL HIGH (ref 70–99)

## 2023-07-29 LAB — HEPARIN LEVEL (UNFRACTIONATED): Heparin Unfractionated: <0.1 [IU]/mL — ABNORMAL LOW (ref 0.30–0.70)

## 2023-07-29 LAB — GLUCOSE, CAPILLARY: Glucose-Capillary: 156 mg/dL — ABNORMAL HIGH (ref 70–99)

## 2023-07-29 LAB — PROTIME-INR
INR: 1.3 — ABNORMAL HIGH (ref 0.8–1.2)
Prothrombin Time: 16.2 s — ABNORMAL HIGH (ref 11.4–15.2)

## 2023-07-29 LAB — APTT: aPTT: 32 s (ref 24–36)

## 2023-07-29 LAB — TROPONIN I (HIGH SENSITIVITY)
Troponin I (High Sensitivity): 446 ng/L (ref <18)
Troponin I (High Sensitivity): 511 ng/L (ref <18)

## 2023-07-29 MED ORDER — NICOTINE 21 MG/24HR TD PT24: 21.0000 mg | MEDICATED_PATCH | Freq: Every day | TRANSDERMAL | Status: DC | PRN

## 2023-07-29 MED ORDER — ONDANSETRON HCL 4 MG/2ML IJ SOLN: 4.0000 mg | Freq: Four times a day (QID) | INTRAMUSCULAR | Status: AC | PRN

## 2023-07-29 MED ORDER — VITAMIN D 25 MCG (1000 UNIT) PO TABS
1000.0000 [IU] | ORAL_TABLET | Freq: Every day | ORAL | Status: DC
  Administered 2023-07-29 – 2023-08-02 (×5): 1000 [IU] via ORAL
  Filled 2023-07-29 (×5): qty 1

## 2023-07-29 MED ORDER — RILPIVIRINE HCL 25 MG PO TABS: 25.0000 mg | ORAL_TABLET | Freq: Every day | ORAL | Status: DC

## 2023-07-29 MED ORDER — DOLUTEGRAVIR-RILPIVIRINE 50-25 MG PO TABS: 1.0000 | ORAL_TABLET | Freq: Every day | ORAL | Status: DC

## 2023-07-29 MED ORDER — DOLUTEGRAVIR-RILPIVIRINE 50-25 MG PO TABS
1.0000 | ORAL_TABLET | Freq: Every day | ORAL | Status: DC
  Administered 2023-07-30 – 2023-08-03 (×5): 1 via ORAL
  Filled 2023-07-29 (×6): qty 1

## 2023-07-29 MED ORDER — HEPARIN BOLUS VIA INFUSION
1500.0000 [IU] | Freq: Once | INTRAVENOUS | Status: AC
  Administered 2023-07-29: 1500 [IU] via INTRAVENOUS
  Filled 2023-07-29: qty 1500

## 2023-07-29 MED ORDER — TECHNETIUM TO 99M ALBUMIN AGGREGATED
4.4000 | Freq: Once | INTRAVENOUS | Status: AC | PRN
Start: 2023-07-29 — End: 2023-07-29
  Administered 2023-07-29: 4.4 via INTRAVENOUS

## 2023-07-29 MED ORDER — FOLIC ACID 1 MG PO TABS
1.0000 mg | ORAL_TABLET | Freq: Every day | ORAL | Status: DC
Start: 2023-07-29 — End: 2023-08-03
  Administered 2023-07-29 – 2023-08-03 (×6): 1 mg via ORAL
  Filled 2023-07-29 (×6): qty 1

## 2023-07-29 MED ORDER — SODIUM CHLORIDE 0.9 % IV SOLN
2.0000 g | Freq: Every day | INTRAVENOUS | Status: DC
  Administered 2023-07-29 – 2023-07-30 (×2): 2 g via INTRAVENOUS
  Filled 2023-07-29 (×2): qty 20

## 2023-07-29 MED ORDER — ONDANSETRON HCL 4 MG PO TABS: 4.0000 mg | ORAL_TABLET | Freq: Four times a day (QID) | ORAL | Status: AC | PRN

## 2023-07-29 MED ORDER — SODIUM CHLORIDE 0.9 % IV SOLN
500.0000 mg | Freq: Every day | INTRAVENOUS | Status: DC
  Administered 2023-07-29 – 2023-07-30 (×2): 500 mg via INTRAVENOUS
  Filled 2023-07-29 (×2): qty 5

## 2023-07-29 MED ORDER — ACETAMINOPHEN 325 MG PO TABS: 650.0000 mg | ORAL_TABLET | Freq: Four times a day (QID) | ORAL | Status: AC | PRN

## 2023-07-29 MED ORDER — ATORVASTATIN CALCIUM 20 MG PO TABS
40.0000 mg | ORAL_TABLET | Freq: Every day | ORAL | Status: DC
  Administered 2023-07-29 – 2023-08-02 (×5): 40 mg via ORAL
  Filled 2023-07-29 (×5): qty 2

## 2023-07-29 MED ORDER — VILAZODONE HCL 20 MG PO TABS
20.0000 mg | ORAL_TABLET | Freq: Every day | ORAL | Status: DC
  Administered 2023-07-30 – 2023-08-03 (×5): 20 mg via ORAL
  Filled 2023-07-29 (×5): qty 1

## 2023-07-29 MED ORDER — INSULIN ASPART 100 UNIT/ML IJ SOLN: 0.0000 [IU] | Freq: Every day | INTRAMUSCULAR | Status: DC

## 2023-07-29 MED ORDER — LOPERAMIDE HCL 2 MG PO CAPS: 2.0000 mg | ORAL_CAPSULE | Freq: Four times a day (QID) | ORAL | Status: DC | PRN

## 2023-07-29 MED ORDER — INSULIN ASPART 100 UNIT/ML IJ SOLN
0.0000 [IU] | Freq: Three times a day (TID) | INTRAMUSCULAR | Status: DC
  Administered 2023-07-29 – 2023-07-31 (×4): 2 [IU] via SUBCUTANEOUS
  Administered 2023-07-31: 1 [IU] via SUBCUTANEOUS
  Administered 2023-07-31 – 2023-08-01 (×2): 2 [IU] via SUBCUTANEOUS
  Administered 2023-08-01: 1 [IU] via SUBCUTANEOUS
  Administered 2023-08-01: 2 [IU] via SUBCUTANEOUS
  Administered 2023-08-02 (×2): 1 [IU] via SUBCUTANEOUS
  Administered 2023-08-02 – 2023-08-03 (×3): 2 [IU] via SUBCUTANEOUS
  Filled 2023-07-29 (×14): qty 1

## 2023-07-29 MED ORDER — METOPROLOL SUCCINATE ER 100 MG PO TB24
100.0000 mg | ORAL_TABLET | Freq: Every day | ORAL | Status: DC
  Administered 2023-07-31 – 2023-08-03 (×4): 100 mg via ORAL
  Filled 2023-07-29 (×4): qty 1

## 2023-07-29 MED ORDER — HEPARIN (PORCINE) 25000 UT/250ML-% IV SOLN
1050.0000 [IU]/h | INTRAVENOUS | Status: DC
  Administered 2023-07-29: 600 [IU]/h via INTRAVENOUS
  Administered 2023-07-30: 1050 [IU]/h via INTRAVENOUS
  Filled 2023-07-29 (×2): qty 250

## 2023-07-29 MED ORDER — FINASTERIDE 5 MG PO TABS
5.0000 mg | ORAL_TABLET | Freq: Every day | ORAL | Status: DC
  Administered 2023-07-30 – 2023-08-03 (×5): 5 mg via ORAL
  Filled 2023-07-29 (×5): qty 1

## 2023-07-29 MED ORDER — DOLUTEGRAVIR SODIUM 50 MG PO TABS: 50.0000 mg | ORAL_TABLET | Freq: Every day | ORAL | Status: DC

## 2023-07-29 MED ORDER — SODIUM BICARBONATE 650 MG PO TABS
650.0000 mg | ORAL_TABLET | Freq: Two times a day (BID) | ORAL | Status: DC
  Administered 2023-07-29 – 2023-08-03 (×10): 650 mg via ORAL
  Filled 2023-07-29 (×10): qty 1

## 2023-07-29 MED ORDER — HEPARIN BOLUS VIA INFUSION
3000.0000 [IU] | Freq: Once | INTRAVENOUS | Status: AC
  Administered 2023-07-29: 3000 [IU] via INTRAVENOUS
  Filled 2023-07-29: qty 3000

## 2023-07-29 MED ORDER — ACETAMINOPHEN 650 MG RE SUPP
650.0000 mg | Freq: Four times a day (QID) | RECTAL | Status: AC | PRN
Start: 2023-07-29 — End: 2023-08-03

## 2023-07-29 NOTE — ED Notes (Signed)
 Willo, MD, made aware of troponin 446

## 2023-07-29 NOTE — Assessment & Plan Note (Addendum)
 Status post heparin  bolus and gtt. Continue with heparin  GGT on admission Cardiology, Dr. Darron has been consulted and is aware Complete echo ordered on admission Telemetry cardiac, inpatient

## 2023-07-29 NOTE — Assessment & Plan Note (Addendum)
 At baseline Home sodium bicarbonate  650 mg p.o. twice daily resumed

## 2023-07-29 NOTE — Assessment & Plan Note (Signed)
 Home Jardiance 25 mg daily and metformin 500 mg daily will not be resumed on admission Insulin  SSI with at bedtime coverage ordered Goal inpatient blood glucose levels 140-180

## 2023-07-29 NOTE — Assessment & Plan Note (Signed)
 Right lung base opacity with leukocytosis Community-acquired pneumonia cannot be excluded at this time Ceftriaxone  2 g IV daily, azithromycin  500 mg IV daily, to complete a 5-day course Incentive spirometry, flutter valve

## 2023-07-29 NOTE — Assessment & Plan Note (Addendum)
 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

## 2023-07-29 NOTE — Consult Note (Signed)
 PHARMACY - ANTICOAGULATION CONSULT NOTE  Pharmacy Consult for IV Heparin  Indication: chest pain/ACS  Patient Measurements: Height: 6' 1 (185.4 cm) Weight: 49.2 kg (108 lb 6.4 oz) IBW/kg (Calculated) : 79.9 HEPARIN  DW (KG): 49.2  Labs: Recent Labs    07/29/23 1023  HGB 9.1*  HCT 30.8*  PLT 162  CREATININE 2.83*  TROPONINIHS 446*    Estimated Creatinine Clearance: 18.8 mL/min (A) (by C-G formula based on SCr of 2.83 mg/dL (H)).   Medical History: Past Medical History:  Diagnosis Date   Acute renal failure (HCC)    Altered mental status 01/20/2012   Anemia    Brain lesion    Cholelithiasis    Chronic indwelling Foley catheter    CKD (chronic kidney disease) stage 3, GFR 30-59 ml/min (HCC)    COPD (chronic obstructive pulmonary disease) (HCC)    Diabetic neuropathy (HCC)    DKA (diabetic ketoacidosis) (HCC)    DM (diabetes mellitus), type 2 (HCC)    Elevated PSA    Gait instability    uses walker   H/O urinary retention    Hepatitis C infection    s/p Harvoni   History of cocaine use    last used in 2012   HIV (human immunodeficiency virus infection) (HCC)    HIV dementia (HCC)    Hypertension    IPMN (intraductal papillary mucinous neoplasm)    Lives in group home    [facility]   Metabolic acidosis    Pancreatic cyst    Pneumonia    Thrombocytopenia (HCC)    Tobacco use     Medications:  No apparent anticoagulation prior to admission; med reconciliation is pending  Assessment: 63 y/o M with medical history as above presenting to the ED 07/29/23 with chest pain. Troponin is elevated. Pharmacy consulted to initiate and manage heparin  infusion for ACS.  Baseline aPTT and INR are penidng. Baseline CBC notable for anemia which appears consistent with patient's baseline.  Goal of Therapy:  Heparin  level 0.3-0.7 units/ml Monitor platelets by anticoagulation protocol: Yes   Plan:  --Heparin  3000 unit IV bolus followed by  continuous infusion at 600 units/hr --HL 8 hours from initiation --Daily CBC per protocol while on IV heparin   Jay Flores 07/29/2023,11:53 AM

## 2023-07-29 NOTE — Assessment & Plan Note (Signed)
 Home atorvastatin  20 mg nightly not resumed on admission Initiated atorvastatin  40 mg nightly on admission

## 2023-07-29 NOTE — Hospital Course (Addendum)
 Mr. 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  Vitals in the ED showed T of 97.9, rr 22, hr 75, blood pressure 109/77, SpO2 of 99% on room air.  Serum sodium is 141, potassium 4.4, chloride 110, bicarb 22, BUN of 33, serum creatinine of 2.83, EGFR 24, nonfasting blood glucose 218, WBC 12.4, hemoglobin 9.1, platelets of 162.  HS troponin was elevated at 446 and on repeat was 511.  Portable CXR in the ED was read as right lung base opacity.  ED treatment: Heparin  bolus and gtt., EDP consulted cardiology who states they will see the patient.

## 2023-07-29 NOTE — Consult Note (Signed)
 PHARMACY - ANTICOAGULATION CONSULT NOTE  Pharmacy Consult for IV Heparin  Indication: chest pain/ACS  Patient Measurements: Height: 5' 8 (172.7 cm) Weight: 50.5 kg (111 lb 5.3 oz) IBW/kg (Calculated) : 68.4 HEPARIN  DW (KG): 50.5  Labs: Recent Labs    07/29/23 1023 07/29/23 1157 07/29/23 2116  HGB 9.1*  --   --   HCT 30.8*  --   --   PLT 162  --   --   APTT  --  32  --   LABPROT  --  16.2*  --   INR  --  1.3*  --   HEPARINUNFRC  --   --  <0.10*  CREATININE 2.83*  --   --   TROPONINIHS 446* 511*  --     Estimated Creatinine Clearance: 19.3 mL/min (A) (by C-G formula based on SCr of 2.83 mg/dL (H)).   Medical History: Past Medical History:  Diagnosis Date   Acute renal failure (HCC)    Altered mental status 01/20/2012   Anemia    Brain lesion    Cholelithiasis    Chronic indwelling Foley catheter    CKD (chronic kidney disease) stage 3, GFR 30-59 ml/min (HCC)    COPD (chronic obstructive pulmonary disease) (HCC)    Diabetic neuropathy (HCC)    DKA (diabetic ketoacidosis) (HCC)    DM (diabetes mellitus), type 2 (HCC)    Elevated PSA    Gait instability    uses walker   H/O urinary retention    Hepatitis C infection    s/p Harvoni   History of cocaine use    last used in 2012   HIV (human immunodeficiency virus infection) (HCC)    HIV dementia (HCC)    Hypertension    IPMN (intraductal papillary mucinous neoplasm)    Lives in group home    [facility]   Metabolic acidosis    Pancreatic cyst    Pneumonia    Thrombocytopenia (HCC)    Tobacco use     Medications:  No apparent anticoagulation prior to admission; med reconciliation is pending  Assessment: 63 y/o M with medical history as above presenting to the ED 07/29/23 with chest pain. Troponin is elevated. Pharmacy consulted to initiate and manage heparin  infusion for ACS.  Baseline aPTT and INR are penidng. Baseline CBC notable for anemia which appears consistent with  patient's baseline.  Goal of Therapy:  Heparin  level 0.3-0.7 units/ml Monitor platelets by anticoagulation protocol: Yes   06/23 2116 HL < 0.10, subtherapeutic @ 600 units/hr  Plan:  HL subtherapeutic Give heparin  1500 units IV x 1 Increase heparin  infusion to 750 units/hr Recheck heparin  level 8 hours after rate change CBC daily while on heparin   Kayla JULIANNA Blew, PharmD 07/29/2023,10:23 PM

## 2023-07-29 NOTE — ED Provider Notes (Signed)
 [facility] Provider Note    Event Date/Time   First MD Initiated Contact with Patient 07/29/23 1001     (approximate)   History   Chief Complaint Chest Pain   HPI  Jay Flores is a 63 y.o. male with past medical history of hypertension, HIV, prostate cancer, CKD, and BPH with chronic Foley who presents to the ED complaining of chest pain.  Patient reports that he began having dull aching pain across both sides of his chest about an hour prior to arrival.  He denies any associated fevers, cough, or difficulty breathing.  He has not noticed any pain or swelling in his legs, denies history of similar symptoms.     Physical Exam   Triage Vital Signs: ED Triage Vitals  Encounter Vitals Group     BP 07/29/23 1006 109/77     Girls Systolic BP Percentile --      Girls Diastolic BP Percentile --      Boys Systolic BP Percentile --      Boys Diastolic BP Percentile --      Pulse Rate 07/29/23 1006 75     Resp 07/29/23 1006 (!) 22     Temp 07/29/23 1006 97.9 F (36.6 C)     Temp Source 07/29/23 1006 Oral     SpO2 07/29/23 1006 99 %     Weight 07/29/23 1002 108 lb 6.4 oz (49.2 kg)     Height 07/29/23 1002 6' 1 (1.854 m)     Head Circumference --      Peak Flow --      Pain Score 07/29/23 1002 10     Pain Loc --      Pain Education --      Exclude from Growth Chart --     Most recent vital signs: Vitals:   07/29/23 1006  BP: 109/77  Pulse: 75  Resp: (!) 22  Temp: 97.9 F (36.6 C)  SpO2: 99%    Constitutional: Alert and oriented. Eyes: Conjunctivae are normal. Head: Atraumatic. Nose: No congestion/rhinnorhea. Mouth/Throat: Mucous membranes are moist.  Cardiovascular: Normal rate, regular rhythm. Grossly normal heart sounds.  2+ radial pulses bilaterally. Respiratory: Normal respiratory effort.  No retractions. Lungs CTAB.  Bilateral chest wall tenderness to palpation noted. Gastrointestinal: Soft and nontender. No  distention. Musculoskeletal: No lower extremity tenderness nor edema.  Neurologic:  Normal speech and language. No gross focal neurologic deficits are appreciated.    ED Results / Procedures / Treatments   Labs (all labs ordered are listed, but only abnormal results are displayed) Labs Reviewed  BASIC METABOLIC PANEL WITH GFR - Abnormal; Notable for the following components:      Result Value   Glucose, Bld 218 (*)    BUN 33 (*)    Creatinine, Ser 2.83 (*)    GFR, Estimated 24 (*)    All other components within normal limits  CBC - Abnormal; Notable for the following components:   WBC 12.4 (*)    RBC 3.22 (*)    Hemoglobin 9.1 (*)    HCT 30.8 (*)    MCHC 29.5 (*)    All other components within normal limits  PROTIME-INR - Abnormal; Notable for the following components:   Prothrombin Time 16.2 (*)    INR 1.3 (*)    All other components within normal limits  TROPONIN I (HIGH SENSITIVITY) - Abnormal; Notable for the following components:   Troponin I (High Sensitivity) 446 (*)  All other components within normal limits  TROPONIN I (HIGH SENSITIVITY) - Abnormal; Notable for the following components:   Troponin I (High Sensitivity) 511 (*)    All other components within normal limits  APTT  HEPARIN  LEVEL (UNFRACTIONATED)     EKG  ED ECG REPORT I, Carlin Palin, the attending physician, personally viewed and interpreted this ECG.   Date: 07/29/2023  EKG Time: 10:22  Rate: 77  Rhythm: normal sinus rhythm  Axis: Normal  Intervals:none  ST&T Change: None  RADIOLOGY Chest x-ray reviewed and interpreted by me with no infiltrate, edema, or effusion.  PROCEDURES:  Critical Care performed: No  Procedures   MEDICATIONS ORDERED IN ED: Medications  heparin  bolus via infusion 3,000 Units (3,000 Units Intravenous Bolus from Bag 07/29/23 1208)    Followed by  heparin  ADULT infusion 100 units/mL (25000 units/250mL) (600 Units/hr Intravenous New Bag/Given 07/29/23 1209)      IMPRESSION / MDM / ASSESSMENT AND PLAN / ED COURSE  I reviewed the triage vital signs and the nursing notes.                              63 y.o. male with past medical history of hypertension, HIV, CKD, prostate cancer, and BPH with chronic Foley who presents to the ED complaining of 1 hour of aching pain in his chest.  Patient's presentation is most consistent with acute presentation with potential threat to life or bodily function.  Differential diagnosis includes, but is not limited to, ACS, PE, pneumonia, pneumothorax, musculoskeletal pain, GERD, anxiety.  Patient nontoxic-appearing and in no acute distress, vital signs are unremarkable.  EKG shows no evidence of arrhythmia or ischemia, symptoms seem atypical for ACS given pain is reproducible with palpation.  Given acute onset, we will check 2 sets of troponin, additional labs and chest x-ray are also pending at this time.  Initial troponin significantly elevated at greater than 400, on reassessment patient states that his chest pain is resolving.  We will start on IV heparin , perfusion scan was also ordered to rule out PE given patient's recent treatment for prostate cancer.  He unfortunately cannot tolerate CTA due to renal dysfunction.  Additional labs with stable CKD and no significant anemia, leukocytosis, or electrolyte abnormality.  Chest x-ray with questionable pneumonia but no symptoms to suggest pneumonia at this time.  Case discussed with Dr. Darron of cardiology, who will see patient in consultation.  Case discussed with hospitalist for admission.      FINAL CLINICAL IMPRESSION(S) / ED DIAGNOSES   Final diagnoses:  NSTEMI (non-ST elevated myocardial infarction) (HCC)     Rx / DC Orders   ED Discharge Orders     None        Note:  This document was prepared using Dragon voice recognition software and may include unintentional dictation errors.   Palin Carlin, MD 07/29/23 [phone]

## 2023-07-29 NOTE — Assessment & Plan Note (Addendum)
 -  As needed nicotine patch ordered ?

## 2023-07-29 NOTE — Assessment & Plan Note (Signed)
 Status post Harvoni

## 2023-07-29 NOTE — Assessment & Plan Note (Addendum)
 Home vilazodone  20 mg daily resumed

## 2023-07-29 NOTE — ED Triage Notes (Signed)
 Arrived by [facility] from [facility]. Diagnosed with UTI last week. C/o left sided chest pain that started today. No radiation  Diagnosed with renal failure  Baseline wheelchair bound. Reports he can stand and pivot for short period  EMS administered 324mg  aspirin  EMS vitals: 104/59 b/p 76HR 100% RA

## 2023-07-29 NOTE — H&P (Addendum)
 History and Physical   Jay Flores FMW:[id number] DOB: Oct 19, 1960 DOA: 07/29/2023  PCP: [name], MD  Outpatient Specialists: Dr. Romero, [facility] Patient coming from: [facility] care home via EMS  I have personally briefly reviewed patient's old medical records in [facility] EMR.  Chief Concern: Chest pain  HPI: Mr. 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  Vitals in the ED showed T of 97.9, rr 22, hr 75, blood pressure 109/77, SpO2 of 99% on room air.  Serum sodium is 141, potassium 4.4, chloride 110, bicarb 22, BUN of 33, serum creatinine of 2.83, EGFR 24, nonfasting blood glucose 218, WBC 12.4, hemoglobin 9.1, platelets of 162.  HS troponin was elevated at 446 and on repeat was 511.  Portable CXR in the ED was read as right lung base opacity.  ED treatment: Heparin  bolus and gtt., EDP consulted cardiology who states they will see the patient. -------------------------------------------- At bedside, patient is able to tell me his first and last name and current location.  He was not able to tell me his age or the current calendar year.  He states his birthday year is [birth year] and that his age is 63 years old.  He reports he had chest pain that started today points to the left side.  He reports he is never felt this way before.  He denies shortness of breath.  He denies trauma to his person.  He reports he does not know if he takes this medication, he states that the facility just gives it to him.  Social history: He currently lives in home care facility.  He currently smokes cigarettes.  ROS: Unable to complete due to patient with what appears to be moderate to advanced HIV dementia  ED Course: Discussed  with EDP, patient requiring hospitalization for chief concerns of NSTEMI.  Assessment/Plan  Principal Problem:   NSTEMI (non-ST elevated myocardial infarction) (HCC) Active Problems:   HIV (human immunodeficiency virus infection) (HCC)   Symptomatic anemia   Prostate cancer (HCC)   Tobacco use   HTN (hypertension)   HLD (hyperlipidemia)   Type II diabetes mellitus with renal manifestations (HCC)   Diabetic neuropathy (HCC)   Pancreatic cyst   Hepatitis C infection   HIV dementia (HCC)   Weakness   Depression   Diabetes mellitus type 2, noninsulin dependent (HCC)   CKD (chronic kidney disease) stage 4, GFR 15-29 ml/min (HCC)   Leukocytosis   Right pulmonary infiltrate on CXR   Assessment and Plan:  * NSTEMI (non-ST elevated myocardial infarction) (HCC) Status post heparin  bolus and gtt. Continue with heparin  GGT on admission Cardiology, Dr. Darron has been consulted and is aware Complete echo ordered on admission Telemetry cardiac, inpatient  HIV (human immunodeficiency virus infection) (HCC) With HIV dementia Home Juluca  resumed on admission  HLD (hyperlipidemia) Home atorvastatin  20 mg nightly not resumed on admission Initiated atorvastatin  40 mg nightly on admission  Tobacco use As needed nicotine  patch ordered  Right pulmonary infiltrate on CXR Right lung base opacity with leukocytosis Community-acquired pneumonia cannot be excluded at this time Ceftriaxone  2 g IV daily, azithromycin  500 mg IV daily, to complete a 5-day course Incentive spirometry, flutter valve  CKD (chronic kidney disease) stage 4, GFR 15-29 ml/min (HCC) At baseline Home sodium bicarbonate  650  mg p.o. twice daily resumed  Diabetes mellitus type 2, noninsulin dependent (HCC) Home Jardiance 25 mg daily and metformin 500 mg daily will not be resumed on admission Insulin  SSI with at bedtime coverage ordered Goal inpatient blood glucose levels 140-180  Depression Home vilazodone  20 mg daily  resumed  Hepatitis C infection Status post Harvoni  Chart reviewed.   DVT prophylaxis: Heparin  GTT Code Status: full code  Diet: npo, pending cardiology evaluation Family Communication: attempted to call sister, [name] Ewing at patient's request. No pick up and voicemail did not indicated a name. No messages left for HIPAA protection.  Disposition Plan: Pending clinical course; pending cardiology evaluation Consults called: Cardiology Admission status: Telemetry cardiac, inpatient  Past Medical History:  Diagnosis Date   Acute renal failure (HCC)    Altered mental status 01/20/2012   Anemia    Brain lesion    Cholelithiasis    Chronic indwelling Foley catheter    CKD (chronic kidney disease) stage 3, GFR 30-59 ml/min (HCC)    COPD (chronic obstructive pulmonary disease) (HCC)    Diabetic neuropathy (HCC)    DKA (diabetic ketoacidosis) (HCC)    DM (diabetes mellitus), type 2 (HCC)    Elevated PSA    Gait instability    uses walker   H/O urinary retention    Hepatitis C infection    s/p Harvoni   History of cocaine use    last used in 2012   HIV (human immunodeficiency virus infection) (HCC)    HIV dementia (HCC)    Hypertension    IPMN (intraductal papillary mucinous neoplasm)    Lives in group home    [facility]   Metabolic acidosis    Pancreatic cyst    Pneumonia    Thrombocytopenia (HCC)    Tobacco use    Past Surgical History:  Procedure Laterality Date   COLONOSCOPY WITH PROPOFOL  N/A 09/05/2022   Procedure: COLONOSCOPY WITH PROPOFOL ;  Surgeon: Toledo, Ladell POUR, MD;  Location: [facility] ENDOSCOPY;  Service: Gastroenterology;  Laterality: N/A;   EUS N/A 04/19/2022   Procedure: UPPER ENDOSCOPIC ULTRASOUND (EUS) LINEAR;  Surgeon: Elta Fonda SQUIBB, MD;  Location: [facility] ENDOSCOPY;  Service: Gastroenterology;  Laterality: N/A;  Requesting 1p slot   HEMOSTASIS CLIP PLACEMENT  09/05/2022   Procedure: HEMOSTASIS CLIP PLACEMENT;  Surgeon: Aundria,  Ladell POUR, MD;  Location: [facility] ENDOSCOPY;  Service: Gastroenterology;;   POLYPECTOMY  09/05/2022   Procedure: POLYPECTOMY;  Surgeon: Aundria, Ladell POUR, MD;  Location: [facility] ENDOSCOPY;  Service: Gastroenterology;;   PROSTATE BIOPSY N/A 02/26/2023   Procedure: PROSTATE BIOPSY;  Surgeon: Twylla Glendia BROCKS, MD;  Location: [facility] ORS;  Service: Urology;  Laterality: N/A;   TRANSRECTAL ULTRASOUND N/A 02/26/2023   Procedure: TRANSRECTAL ULTRASOUND;  Surgeon: Twylla Glendia BROCKS, MD;  Location: [facility] ORS;  Service: Urology;  Laterality: N/A;   Social History:  reports that he has been smoking cigarettes. He has a 34 pack-year smoking history. He has been exposed to tobacco smoke. He has never used smokeless tobacco. He reports that he does not currently use drugs after having used the following drugs: Cocaine. He reports that he does not drink alcohol.  No Known Allergies Family History  Problem Relation Age of Onset   Diabetes Mellitus II Mother    Family history: Family history reviewed and not pertinent.  Prior to Admission medications   Medication Sig Start Date End Date Taking? Authorizing Provider  Accu-Chek Softclix Lancets lancets by Other route. Use as instructed  [provider]  acetaminophen  (TYLENOL  8 HOUR ARTHRITIS PAIN) 650 MG CR tablet Take 650 mg by mouth every 4 (four) hours as needed for pain.    [provider]  atorvastatin  (LIPITOR) 20 MG tablet Take 20 mg by mouth at bedtime.    [provider]  cholecalciferol  (VITAMIN D3) 25 MCG (1000 UNIT) tablet Take 1,000 Units by mouth at bedtime.    [provider]  Continuous Blood Gluc Receiver (FREESTYLE LIBRE 14 DAY READER) DEVI Apply topically. 10/31/21   [provider]  Continuous Blood Gluc Sensor (FREESTYLE LIBRE 14 DAY SENSOR) MISC Apply topically. 01/17/22   [provider]  Dolutegravir -Rilpivirine  (JULUCA ) 50-25 MG TABS Take 1 tablet by mouth daily.    [provider]   finasteride  (PROSCAR ) 5 MG tablet Take 5 mg by mouth daily.    [provider]  folic acid  (FOLVITE ) 1 MG tablet Take 1 tablet (1 mg total) by mouth daily. 04/27/23   Darci Pore, MD  glucose blood (ACCU-CHEK AVIVA PLUS) test strip 1 each by Other route in the morning, at noon, and at bedtime. Use as instructed    [provider]  JARDIANCE 25 MG TABS tablet Take 25 mg by mouth in the morning. 01/14/23   [provider]  loperamide (IMODIUM) 2 MG capsule Take 2 mg by mouth 4 (four) times daily as needed for diarrhea or loose stools. 06/06/23   [provider]  losartan  (COZAAR ) 100 MG tablet Take 100 mg by mouth daily.    [provider]  metFORMIN (GLUCOPHAGE) 500 MG tablet Take 500 mg by mouth daily with breakfast. 07/03/23   [provider]  metoprolol  succinate (TOPROL -XL) 100 MG 24 hr tablet Take 100 mg by mouth daily. 09/03/20   [provider]  neomycin-bacitracin-polymyxin [id number] OINT Apply 1 Application topically daily. 03/15/23   Janit Thresa HERO, DPM  nicotine  (NICODERM CQ  - DOSED IN MG/24 HOURS) 21 mg/24hr patch Place 1 patch (21 mg total) onto the skin daily. 04/27/23   Darci Pore, MD  sodium bicarbonate  650 MG tablet Take 650 mg by mouth 2 (two) times daily.    [provider]  sucralfate (CARAFATE) 1 g tablet Take 1 g by mouth 2 (two) times daily. 07/02/23   [provider]  Vilazodone  HCl 20 MG TABS Take 20 mg by mouth daily.    [provider]   Physical Exam: Vitals:   07/29/23 1002 07/29/23 1006  BP:  109/77  Pulse:  75  Resp:  (!) 22  Temp:  97.9 F (36.6 C)  TempSrc:  Oral  SpO2:  99%  Weight: 49.2 kg   Height: 6' 1 (1.854 m)    Constitutional: appears frail, cachectic, malnourished Eyes: PERRL, lids and conjunctivae normal HENMT: Bilateral temporal wasting, mucous membranes are moist. Posterior pharynx clear of any exudate or lesions. Age-appropriate  dentition. Hearing appropriate Neck: normal, supple, no masses, no thyromegaly Respiratory: clear to auscultation bilaterally, no wheezing, no crackles. Normal respiratory effort. No accessory muscle use.  Cardiovascular: Regular rate and rhythm, no murmurs / rubs / gallops. No extremity edema. 2+ pedal pulses. No carotid bruits.  Abdomen: Scaphoid abdomen, no tenderness, no masses palpated, no hepatosplenomegaly. Bowel sounds positive.  Musculoskeletal: no clubbing / cyanosis. No joint deformity upper and lower extremities. Good ROM, no contractures, no atrophy. Normal muscle tone.  Skin: no rashes, lesions, ulcers. No induration Neurologic: Sensation intact. Strength 5/5 in all 4.  Psychiatric: Lacks judgment and insight consistent  with dementia. Alert and oriented x 3. Normal mood.   EKG: independently reviewed, showing sinus rhythm with rate of 77, QTc 478  Chest x-ray on Admission: I personally reviewed and I agree with radiologist reading as below.  NM Pulmonary Perfusion Result Date: 07/29/2023 CLINICAL DATA:  Short of breath.  Concern for pulmonary embolism EXAM: NUCLEAR MEDICINE PERFUSION LUNG SCAN TECHNIQUE: Perfusion images were obtained in multiple projections after intravenous injection of radiopharmaceutical. RADIOPHARMACEUTICALS:  4.4 mCi Tc-38m MAA COMPARISON:  Chest radiograph 07/29/2018 FINDINGS: Wedge-shaped peripheral perfusion defect within the lateral RIGHT lower lobe and RIGHT middle lobe. No perfusion defects in LEFT lung. Subtle opacity in the RIGHT lung base. Perfusion defect greater than the opacity volume. Potential pulmonary infarction radiograph. IMPRESSION: Wedge-shaped peripheral perfusion defects not matched on radiograph. Findings concerning for acute pulmonary embolism the RIGHT lower lobe. Electronically Signed   By: Jackquline Boxer M.D.   On: 07/29/2023 14:14   DG Chest 2 View Result Date: 07/29/2023 CLINICAL DATA:  Chest pain EXAM: CHEST - 2 VIEW COMPARISON:   X-ray 07/06/2023. FINDINGS: No edema, pneumothorax or effusion. Normal cardiopericardial silhouette. Subtle opacity however the right lung base. Overlapping cardiac leads. IMPRESSION: Subtle opacity at the right lung base, new from previous. Please correlate for a subtle infiltrate. Short follow-up versus CT as clinically appropriate. Electronically Signed   By: Ranell Bring M.D.   On: 07/29/2023 12:33   Labs on Admission: I have personally reviewed following labs  CBC: Recent Labs  Lab 07/29/23 1023  WBC 12.4*  HGB 9.1*  HCT 30.8*  MCV 95.7  PLT 162   Basic Metabolic Panel: Recent Labs  Lab 07/29/23 1023  NA 141  K 4.4  CL 110  CO2 22  GLUCOSE 218*  BUN 33*  CREATININE 2.83*  CALCIUM  9.4   GFR: Estimated Creatinine Clearance: 18.8 mL/min (A) (by C-G formula based on SCr of 2.83 mg/dL (H)).  Coagulation Profile: Recent Labs  Lab 07/29/23 1157  INR 1.3*   Urine analysis:    Component Value Date/Time   COLORURINE YELLOW (A) 07/06/2023 0948   APPEARANCEUR TURBID (A) 07/06/2023 0948   APPEARANCEUR Cloudy (A) 02/11/2023 1123   LABSPEC 1.016 07/06/2023 0948   PHURINE 5.0 07/06/2023 0948   GLUCOSEU >=500 (A) 07/06/2023 0948   HGBUR MODERATE (A) 07/06/2023 0948   BILIRUBINUR NEGATIVE 07/06/2023 0948   BILIRUBINUR Negative 02/11/2023 1123   KETONESUR NEGATIVE 07/06/2023 0948   PROTEINUR 100 (A) 07/06/2023 0948   UROBILINOGEN 1.0 01/23/2012 0602   NITRITE POSITIVE (A) 07/06/2023 0948   LEUKOCYTESUR LARGE (A) 07/06/2023 0948   This document was prepared using Dragon Voice Recognition software and may include unintentional dictation errors.  Dr. Sherre [facility]  If 7PM-7AM, please contact overnight-coverage provider If 7AM-7PM, please contact day attending provider [email or web address]  07/29/2023, 2:58 PM

## 2023-07-30 ENCOUNTER — Inpatient Hospital Stay (HOSPITAL_COMMUNITY)
Admit: 2023-07-30 | Discharge: 2023-07-30 | Disposition: A | Discharge status: Home / Self Care | Attending: Internal Medicine | Admitting: Internal Medicine

## 2023-07-30 ENCOUNTER — Other Ambulatory Visit (HOSPITAL_COMMUNITY): Payer: Self-pay

## 2023-07-30 ENCOUNTER — Telehealth (HOSPITAL_COMMUNITY): Payer: Self-pay | Admitting: Pharmacy Technician

## 2023-07-30 DIAGNOSIS — I214 Non-ST elevation (NSTEMI) myocardial infarction: Secondary | ICD-10-CM

## 2023-07-30 DIAGNOSIS — R079 Chest pain, unspecified: Secondary | ICD-10-CM | POA: Diagnosis not present

## 2023-07-30 LAB — BASIC METABOLIC PANEL WITH GFR
Anion gap: 4 — ABNORMAL LOW (ref 5–15)
BUN: 35 mg/dL — ABNORMAL HIGH (ref 8–23)
CO2: 28 mmol/L (ref 22–32)
Calcium: 8.9 mg/dL (ref 8.9–10.3)
Chloride: 110 mmol/L (ref 98–111)
Creatinine, Ser: 2.71 mg/dL — ABNORMAL HIGH (ref 0.61–1.24)
GFR, Estimated: 26 mL/min — ABNORMAL LOW (ref >60)
Glucose, Bld: 140 mg/dL — ABNORMAL HIGH (ref 70–99)
Potassium: 3.8 mmol/L (ref 3.5–5.1)
Sodium: 142 mmol/L (ref 135–145)

## 2023-07-30 LAB — ECHOCARDIOGRAM COMPLETE
Height: 68 in
S' Lateral: 2.4 cm
Weight: 1781.32 [oz_av]

## 2023-07-30 LAB — GLUCOSE, CAPILLARY
Glucose-Capillary: 183 mg/dL — ABNORMAL HIGH (ref 70–99)
Glucose-Capillary: 112 mg/dL — ABNORMAL HIGH (ref 70–99)
Glucose-Capillary: 163 mg/dL — ABNORMAL HIGH (ref 70–99)
Glucose-Capillary: 161 mg/dL — ABNORMAL HIGH (ref 70–99)

## 2023-07-30 LAB — CBC
HCT: 26.1 % — ABNORMAL LOW (ref 39.0–52.0)
Hemoglobin: 7.9 g/dL — ABNORMAL LOW (ref 13.0–17.0)
MCH: 28 pg (ref 26.0–34.0)
MCHC: 30.3 g/dL (ref 30.0–36.0)
MCV: 92.6 fL (ref 80.0–100.0)
Platelets: 122 10*3/uL — ABNORMAL LOW (ref 150–400)
RBC: 2.82 MIL/uL — ABNORMAL LOW (ref 4.22–5.81)
RDW: 14.1 % (ref 11.5–15.5)
WBC: 7.3 10*3/uL (ref 4.0–10.5)
nRBC: 0 % (ref 0.0–0.2)

## 2023-07-30 LAB — HEPARIN LEVEL (UNFRACTIONATED)
Heparin Unfractionated: 0.14 [IU]/mL — ABNORMAL LOW (ref 0.30–0.70)
Heparin Unfractionated: 0.14 [IU]/mL — ABNORMAL LOW (ref 0.30–0.70)

## 2023-07-30 LAB — PROCALCITONIN: Procalcitonin: 0.44 ng/mL

## 2023-07-30 MED ORDER — APIXABAN 5 MG PO TABS
10.0000 mg | ORAL_TABLET | Freq: Two times a day (BID) | ORAL | Status: DC
  Administered 2023-07-30 – 2023-08-03 (×8): 10 mg via ORAL
  Filled 2023-07-30 (×8): qty 2

## 2023-07-30 MED ORDER — APIXABAN 5 MG PO TABS: 5.0000 mg | ORAL_TABLET | Freq: Two times a day (BID) | ORAL | Status: DC

## 2023-07-30 MED ORDER — CHLORHEXIDINE GLUCONATE CLOTH 2 % EX PADS
6.0000 | MEDICATED_PAD | Freq: Every day | CUTANEOUS | Status: DC
  Administered 2023-07-30 – 2023-08-03 (×4): 6 via TOPICAL

## 2023-07-30 MED ORDER — HEPARIN BOLUS VIA INFUSION
1500.0000 [IU] | Freq: Once | INTRAVENOUS | Status: AC
  Administered 2023-07-30: 1500 [IU] via INTRAVENOUS
  Filled 2023-07-30: qty 1500

## 2023-07-30 NOTE — Progress Notes (Signed)
 RN made Dr. Awanda aware that patient has what looks like dark maroon urine from old blood. No bright red blood. MD saw secure chat but no new orders given.

## 2023-07-30 NOTE — Telephone Encounter (Signed)
 Patient Product/process development scientist completed.    The patient is insured through [employer].     Ran test claim for Eliquis Starter Pack and the current 30 day co-pay is $4.00.   This test claim was processed through [facility]- copay amounts may vary at other pharmacies due to pharmacy/plan contracts, or as the patient moves through the different stages of their insurance plan.     Reyes Sharps, CPHT Pharmacy Technician III Certified Patient Advocate [facility] Patient Advocate Team Direct Number: [phone]  Fax: [phone]

## 2023-07-30 NOTE — Progress Notes (Signed)
 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    Acute pulmonary embolism  --nuclear scan found acute PE in the RIGHT lower lobe.  Likely the cause of pt's dyspnea.  Pt was started on heparin  gtt due to initial concern for NSTEMI. --transition to Eliquis today --US  DVT studies --monitor for hematuria, if worsens, then may have to stop anticoagulation.  Hx of Hematuria Prostate cancer s/p radiation tx --Past UA all showed mod to large amount of Hgb.  Hematuria was mentioned back in March 2025 hospitalization, likely due to prostate cancer. --monitor hematuria on Eliquis inpatient for at least a few days.  Chronic anemia Folate def --Hgb was normal in Jan 2025, then pt had hospitalization in March 2025 for Hgb drop to 5.7, presumed due to hematuria.  Also found to be folate def. --cont home folic acid  supplement --monitor Hgb while on anticoagulation  * NSTEMI, ruled out --trop 446 and 511.  Cardio consulted, favor demand ischemia from acute PE.  Right pulmonary infiltrate on CXR --CXR showed Subtle opacity at the right lung base, more likely changes related to acute PE rather than PNA.   --d/c abx  HIV (human immunodeficiency virus infection) (HCC) With HIV dementia --cont home Juluca   HLD (hyperlipidemia) --cont statin  Tobacco use As needed nicotine  patch ordered  CKD (chronic kidney disease) stage 4, GFR 15-29 ml/min (HCC) At baseline --cont home NaBicarb  Diabetes mellitus type 2, noninsulin dependent (HCC) --on Jardiance 25  mg daily and metformin 500 mg  --ACHS and SSI  Depression --cont vilazodone   Hx of Hepatitis C infection Status post Harvoni   DVT prophylaxis: Nw:Zopvlpd Code Status: Full code  Family Communication:  Level of care: Telemetry Cardiac Dispo:   The patient is from: [place] care home  Anticipated d/c is to: [place] care home  Anticipated d/c date is: 2-3 days   Subjective and Interval History:  Pt reported continued chest pain and dyspnea, but no dysuria.    Pt and RN reported dark red urine today.   Objective: Vitals:   07/30/23 0812 07/30/23 0946 07/30/23 1103 07/30/23 1503  BP: 96/60 96/60 103/69 98/84  Pulse: 79  78 89  Resp: 16  18 16   Temp: 97.7 F (36.5 C)  97.8 F (36.6 C) 97.9 F (36.6 C)  TempSrc: Oral  Oral Oral  SpO2: 100%  100% 100%  Weight:      Height:        Intake/Output Summary (Last 24 hours) at 07/30/2023 1645 Last data filed at 07/30/2023 1500 Gross per 24 hour  Intake 831.93 ml  Output 820 ml  Net 11.93 ml   Filed Weights   07/29/23 1002 07/29/23 2052  Weight: 49.2 kg 50.5 kg    Examination:   Constitutional: NAD, alert, oriented to person and place HEENT: conjunctivae and lids normal, EOMI CV: No cyanosis.   RESP: normal respiratory effort, on RA Neuro: II - XII grossly intact.   Psych: Normal mood and affect.   Foley present with dark dusky red urine   Data Reviewed: I have personally reviewed labs and imaging  studies  Time spent: 50 minutes  Jay Flores, Jay Flores [facility] If 7PM-7AM, please contact night-coverage 07/30/2023, 4:45 PM

## 2023-07-30 NOTE — Progress Notes (Signed)
 Patient transferred to room 221 bed A. Patient alert with no distress noted. Aldo, RN aware patient in room.

## 2023-07-30 NOTE — Consult Note (Signed)
 PHARMACY - ANTICOAGULATION CONSULT NOTE  Pharmacy Consult for Apixaban Indication: pulmonary embolus  Patient Measurements: Height: 5' 8 (172.7 cm) Weight: 50.5 kg (111 lb 5.3 oz) IBW/kg (Calculated) : 68.4 HEPARIN  DW (KG): 50.5  Labs: Recent Labs    07/29/23 1023 07/29/23 1157 07/29/23 2116 07/30/23 0518 07/30/23 1246  HGB 9.1*  --   --  7.9*  --   HCT 30.8*  --   --  26.1*  --   PLT 162  --   --  122*  --   APTT  --  32  --   --   --   LABPROT  --  16.2*  --   --   --   INR  --  1.3*  --   --   --   HEPARINUNFRC  --   --  <0.10* 0.14* 0.14*  CREATININE 2.83*  --   --  2.71*  --   TROPONINIHS 446* 511*  --   --   --     Estimated Creatinine Clearance: 20.2 mL/min (A) (by C-G formula based on SCr of 2.71 mg/dL (H)).   Medical History: Past Medical History:  Diagnosis Date   Acute renal failure (HCC)    Altered mental status 01/20/2012   Anemia    Brain lesion    Cholelithiasis    Chronic indwelling Foley catheter    CKD (chronic kidney disease) stage 3, GFR 30-59 ml/min (HCC)    COPD (chronic obstructive pulmonary disease) (HCC)    Diabetic neuropathy (HCC)    DKA (diabetic ketoacidosis) (HCC)    DM (diabetes mellitus), type 2 (HCC)    Elevated PSA    Gait instability    uses walker   H/O urinary retention    Hepatitis C infection    s/p Harvoni   History of cocaine use    last used in 2012   HIV (human immunodeficiency virus infection) (HCC)    HIV dementia (HCC)    Hypertension    IPMN (intraductal papillary mucinous neoplasm)    Lives in group home    [facility]   Metabolic acidosis    Pancreatic cyst    Pneumonia    Thrombocytopenia (HCC)    Tobacco use     Medications:  No apparent anticoagulation prior to admission; med reconciliation is pending  Assessment: 63 y/o M with medical history as above presenting to the ED 07/29/23 with chest pain. Troponin is elevated. Pharmacy consulted to transition heparin  to  apixaban for acute PE.  CBC notable for Hgb 9.1 >> 7.9 since presentation. Patient with recent history of symptomatic anemia in setting of hematuria in 04/2023 (prostate cancer / indwelling foley)  Plan:  Stop IV heparin  Start apixaban 10 mg BID x 7 days followed by apixaban 5 mg BID for remaining duration of therapy Monitor CBC closely given history of hematuria and anemia  Marolyn KATHEE Mare 07/30/2023 4:52 PM

## 2023-07-30 NOTE — Progress Notes (Signed)
*  PRELIMINARY RESULTS* Echocardiogram 2D Echocardiogram has been performed.  Floydene Harder 07/30/2023, 7:52 AM

## 2023-07-30 NOTE — Consult Note (Signed)
 PHARMACY - ANTICOAGULATION CONSULT NOTE  Pharmacy Consult for IV Heparin  Indication: chest pain/ACS  Patient Measurements: Height: 5' 8 (172.7 cm) Weight: 50.5 kg (111 lb 5.3 oz) IBW/kg (Calculated) : 68.4 HEPARIN  DW (KG): 50.5  Labs: Recent Labs    07/29/23 1023 07/29/23 1157 07/29/23 2116 07/30/23 0518  HGB 9.1*  --   --  7.9*  HCT 30.8*  --   --  26.1*  PLT 162  --   --  122*  APTT  --  32  --   --   LABPROT  --  16.2*  --   --   INR  --  1.3*  --   --   HEPARINUNFRC  --   --  <0.10* 0.14*  CREATININE 2.83*  --   --  2.71*  TROPONINIHS 446* 511*  --   --     Estimated Creatinine Clearance: 20.2 mL/min (A) (by C-G formula based on SCr of 2.71 mg/dL (H)).   Medical History: Past Medical History:  Diagnosis Date   Acute renal failure (HCC)    Altered mental status 01/20/2012   Anemia    Brain lesion    Cholelithiasis    Chronic indwelling Foley catheter    CKD (chronic kidney disease) stage 3, GFR 30-59 ml/min (HCC)    COPD (chronic obstructive pulmonary disease) (HCC)    Diabetic neuropathy (HCC)    DKA (diabetic ketoacidosis) (HCC)    DM (diabetes mellitus), type 2 (HCC)    Elevated PSA    Gait instability    uses walker   H/O urinary retention    Hepatitis C infection    s/p Harvoni   History of cocaine use    last used in 2012   HIV (human immunodeficiency virus infection) (HCC)    HIV dementia (HCC)    Hypertension    IPMN (intraductal papillary mucinous neoplasm)    Lives in group home    [facility]   Metabolic acidosis    Pancreatic cyst    Pneumonia    Thrombocytopenia (HCC)    Tobacco use     Medications:  No apparent anticoagulation prior to admission; med reconciliation is pending  Assessment: 63 y/o M with medical history as above presenting to the ED 07/29/23 with chest pain. Troponin is elevated. Pharmacy consulted to initiate and manage heparin  infusion for ACS.  Baseline aPTT and INR are penidng.  Baseline CBC notable for anemia which appears consistent with patient's baseline.  Goal of Therapy:  Heparin  level 0.3-0.7 units/ml Monitor platelets by anticoagulation protocol: Yes   06/23 2116 HL < 0.10, subtherapeutic @ 600 units/hr 06/24 0518 HL 0.14 subtherapeutic  Plan:  HL subtherapeutic Give heparin  1500 units IV x 1 Increase heparin  infusion to 900 units/hr Recheck heparin  level 8 hours after rate change CBC daily while on heparin   Rankin CANDIE Dills, PharmD, [facility] 07/30/2023 6:29 AM

## 2023-07-30 NOTE — Plan of Care (Signed)
  Problem: Education: Goal: Ability to describe self-care measures that may prevent or decrease complications (Diabetes Survival Skills Education) will improve Outcome: Progressing   Problem: Coping: Goal: Ability to adjust to condition or change in health will improve Outcome: Progressing   Problem: Fluid Volume: Goal: Ability to maintain a balanced intake and output will improve Outcome: Progressing   Problem: Metabolic: Goal: Ability to maintain appropriate glucose levels will improve Outcome: Progressing   Problem: Skin Integrity: Goal: Risk for impaired skin integrity will decrease Outcome: Progressing   Problem: Tissue Perfusion: Goal: Adequacy of tissue perfusion will improve Outcome: Progressing   Problem: Nutritional: Goal: Progress toward achieving an optimal weight will improve Outcome: Progressing   Problem: Clinical Measurements: Goal: Will remain free from infection Outcome: Progressing   Problem: Clinical Measurements: Goal: Diagnostic test results will improve Outcome: Progressing   Problem: Nutrition: Goal: Adequate nutrition will be maintained Outcome: Progressing   Problem: Activity: Goal: Risk for activity intolerance will decrease Outcome: Progressing   Problem: Clinical Measurements: Goal: Cardiovascular complication will be avoided Outcome: Progressing   Problem: Safety: Goal: Ability to remain free from injury will improve Outcome: Progressing   Plan of care, assessment, treatment, monitoring, and intervention (s) ongoing, see MAR see flowsheet

## 2023-07-30 NOTE — Consult Note (Signed)
 Cardiology Consultation   Patient ID: Jay Flores MRN: [id number]; DOB: 09-08-1960  Admit date: 07/29/2023 Date of Consult: 07/30/2023  PCP:  [name], MD   [facility] Providers Cardiologist:  None        Patient Profile: Jay Flores is a 63 y.o. 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 Willo.  History of Present Illness: Jay Flores presented to [facility] 6/23 with initial complaints of left sided chest pain.  Initial respiratory rate 22 with otherwise normal vital signs.  Initial labs were notable for glucose 218, BUN 33, creatinine 2.83, WBC 12.4, hemoglobin 9.1, hematocrit 30.8, PT 16.2, INR 1.3.  Troponin 446 > 511.  EKG shows sinus rhythm without acute ST/T abnormalities.  Chest x-ray with possible pneumonia.  Perfusion scan concerning for acute PE in the right lower lobe.  Patient was started on IV heparin  and admitted for further evaluation.  Cardiology was asked to consult for further evaluation of NSTEMI.  Patient is a poor historian due to baseline dementia. During our discussion he reports that he presented due to shortness of breath which was sudden in onset. He denies chest pain, palpitations, lightheadedness, dizziness, and lower extremity swelling. He reports that he still feels slightly short of breath but that it has overall resolved.    Past Medical History:  Diagnosis Date   Acute renal failure (HCC)    Altered mental status 01/20/2012   Anemia    Brain lesion    Cholelithiasis    Chronic indwelling Foley catheter    CKD (chronic kidney disease) stage 3, GFR 30-59 ml/min (HCC)    COPD (chronic obstructive pulmonary disease) (HCC)    Diabetic neuropathy (HCC)    DKA (diabetic ketoacidosis) (HCC)    DM (diabetes mellitus),  type 2 (HCC)    Elevated PSA    Gait instability    uses walker   H/O urinary retention    Hepatitis C infection    s/p Harvoni   History of cocaine use    last used in 2012   HIV (human immunodeficiency virus infection) (HCC)    HIV dementia (HCC)    Hypertension    IPMN (intraductal papillary mucinous neoplasm)    Lives in group home    [facility]   Metabolic acidosis    Pancreatic cyst    Pneumonia    Thrombocytopenia (HCC)    Tobacco use     Past Surgical History:  Procedure Laterality Date   COLONOSCOPY WITH PROPOFOL  N/A 09/05/2022   Procedure: COLONOSCOPY WITH PROPOFOL ;  Surgeon: Toledo, Ladell POUR, MD;  Location: [facility] ENDOSCOPY;  Service: Gastroenterology;  Laterality: N/A;   EUS N/A 04/19/2022   Procedure: UPPER ENDOSCOPIC ULTRASOUND (EUS) LINEAR;  Surgeon: Elta Fonda SQUIBB, MD;  Location: [facility] ENDOSCOPY;  Service: Gastroenterology;  Laterality: N/A;  Requesting 1p slot   HEMOSTASIS CLIP PLACEMENT  09/05/2022   Procedure: HEMOSTASIS CLIP PLACEMENT;  Surgeon: Aundria, Ladell POUR, MD;  Location: [facility] ENDOSCOPY;  Service: Gastroenterology;;   POLYPECTOMY  09/05/2022   Procedure: POLYPECTOMY;  Surgeon: Aundria, Ladell POUR, MD;  Location: [facility] ENDOSCOPY;  Service: Gastroenterology;;   PROSTATE BIOPSY N/A 02/26/2023   Procedure: PROSTATE BIOPSY;  Surgeon: Twylla Glendia BROCKS, MD;  Location: [facility] ORS;  Service: Urology;  Laterality:  N/A;   TRANSRECTAL ULTRASOUND N/A 02/26/2023   Procedure: TRANSRECTAL ULTRASOUND;  Surgeon: Twylla Glendia BROCKS, MD;  Location: [facility] ORS;  Service: Urology;  Laterality: N/A;       Scheduled Meds:  atorvastatin   40 mg Oral QHS   cholecalciferol   1,000 Units Oral QHS   dolutegravir -rilpivirine   1 tablet Oral Daily   finasteride   5 mg Oral Daily   folic acid   1 mg Oral Daily   insulin  aspart  0-5 Units Subcutaneous QHS   insulin  aspart  0-9 Units Subcutaneous TID WC   metoprolol  succinate  100 mg Oral Daily   sodium  bicarbonate  650 mg Oral BID   Vilazodone  HCl  20 mg Oral Daily   Continuous Infusions:  azithromycin  Stopped (07/29/23 1722)   cefTRIAXone  (ROCEPHIN )  IV 2 g (07/30/23 1006)   heparin  900 Units/hr (07/30/23 0649)   PRN Meds: acetaminophen  **OR** acetaminophen , loperamide, nicotine , ondansetron  **OR** ondansetron  (ZOFRAN ) IV  Allergies:   No Known Allergies  Social History:   Social History   Socioeconomic History   Marital status: Single    Spouse name: Not on file   Number of children: Not on file   Years of education: Not on file   Highest education level: Not on file  Occupational History   Occupation: disabled  Tobacco Use   Smoking status: Every Day    Current packs/day: 1.00    Average packs/day: 1 pack/day for 34.0 years (34.0 ttl pk-yrs)    Types: Cigarettes    Passive exposure: Current   Smokeless tobacco: Never  Vaping Use   Vaping status: Never Used  Substance and Sexual Activity   Alcohol use: No   Drug use: Not Currently    Types: Cocaine    Comment: 01/22/2012 last used cocaine ~ 2012   Sexual activity: Not Currently  Other Topics Concern   Not on file  Social History Narrative   Not on file   Social Drivers of Health   Financial Resource Strain: Not on file  Food Insecurity: No Food Insecurity (07/29/2023)   Hunger Vital Sign    Worried About Running Out of Food in the Last Year: Never true    Ran Out of Food in the Last Year: Never true  Transportation Needs: No Transportation Needs (07/29/2023)   PRAPARE - Transportation    Lack of Transportation (Medical): No    Lack of Transportation (Non-Medical): No  Physical Activity: Not on file  Stress: Not on file  Social Connections: Not on file  Intimate Partner Violence: Not At Risk (07/29/2023)   Humiliation, Afraid, Rape, and Kick questionnaire    Fear of Current or Ex-Partner: No    Emotionally Abused: No    Physically Abused: No    Sexually Abused: No    Family History:    Family  History  Problem Relation Age of Onset   Diabetes Mellitus II Mother      ROS:  Please see the history of present illness.   Physical Exam/Data: Vitals:   07/30/23 0006 07/30/23 0322 07/30/23 0812 07/30/23 0946  BP: 117/73 105/61 96/60 96/60   Pulse: 75 75 79   Resp: (!) 22 10 16    Temp: 97.7 F (36.5 C) (!) 97.5 F (36.4 C) 97.7 F (36.5 C)   TempSrc: Oral Oral Oral   SpO2: 100% 100% 100%   Weight:      Height:        Intake/Output Summary (Last 24 hours) at 07/30/2023 1045 Last data filed  at 07/30/2023 9167 Gross per 24 hour  Intake 239.89 ml  Output 470 ml  Net -230.11 ml      07/29/2023    8:52 PM 07/29/2023   10:02 AM 07/15/2023   11:17 AM  Last 3 Weights  Weight (lbs) 111 lb 5.3 oz 108 lb 6.4 oz --  Weight (kg) 50.5 kg 49.17 kg --     Body mass index is 16.93 kg/m.  General:  Well nourished, well developed, in no acute distress HEENT: normal Neck: no JVD Vascular: No carotid bruits; Distal pulses 2+ bilaterally Cardiac:  normal S1, S2; RRR; no murmur  Lungs:  clear to auscultation bilaterally, no wheezing, rhonchi or rales  Abd: soft, nontender, no hepatomegaly  Ext: no edema Skin: warm and dry  Psych:  Normal affect   EKG:  The EKG was personally reviewed and demonstrates:  sinus rhythm with LVH Telemetry:  Telemetry was personally reviewed and demonstrates: sinus rhythm with occasional PVCs  Relevant CV Studies: Echo pending  Laboratory Data: High Sensitivity Troponin:   Recent Labs  Lab 07/06/23 0947 07/06/23 1226 07/29/23 1023 07/29/23 1157  TROPONINIHS 12 13 446* 511*     Chemistry Recent Labs  Lab 07/29/23 1023 07/30/23 0518  NA 141 142  K 4.4 3.8  CL 110 110  CO2 22 28  GLUCOSE 218* 140*  BUN 33* 35*  CREATININE 2.83* 2.71*  CALCIUM  9.4 8.9  GFRNONAA 24* 26*  ANIONGAP 9 4*    No results for input(s): PROT, ALBUMIN, AST, ALT, ALKPHOS, BILITOT in the last 168 hours. Lipids No results for input(s): CHOL, TRIG,  HDL, LABVLDL, LDLCALC, CHOLHDL in the last 168 hours.  Hematology Recent Labs  Lab 07/29/23 1023 07/30/23 0518  WBC 12.4* 7.3  RBC 3.22* 2.82*  HGB 9.1* 7.9*  HCT 30.8* 26.1*  MCV 95.7 92.6  MCH 28.3 28.0  MCHC 29.5* 30.3  RDW 14.3 14.1  PLT 162 122*   Thyroid No results for input(s): TSH, FREET4 in the last 168 hours.  BNPNo results for input(s): BNP, PROBNP in the last 168 hours.  DDimer No results for input(s): DDIMER in the last 168 hours.  Radiology/Studies:  NM Pulmonary Perfusion Result Date: 07/29/2023 IMPRESSION: Wedge-shaped peripheral perfusion defects not matched on radiograph. Findings concerning for acute pulmonary embolism the RIGHT lower lobe. Electronically Signed   By: Jackquline Boxer M.D.   On: 07/29/2023 14:14   DG Chest 2 View Result Date: 07/29/2023 IMPRESSION: Subtle opacity at the right lung base, new from previous. Please correlate for a subtle infiltrate. Short follow-up versus CT as clinically appropriate. Electronically Signed   By: Ranell Bring M.D.   On: 07/29/2023 12:33   Assessment and Plan:  NSTEMI - Patient initially presenting with complaints of left sided chest pain with troponin 446>511 - No acute ischemic changes on EKG - Now denying chest pain - Echo ordered, further recommendations pending results - Continue IV heparin  - Overall patient would be poor candidate for cardiac cath in the setting of stage IV CKD and HIV dementia  Pulmonary embolism - Patient reporting dyspnea prior to admission which is now improved  - Perfusion imaging concerning for acute PE in the right lower lobe - Continue IV heparin  - Ongoing management per IM  CKD IV - Cr appears near baseline - Ongoing management per IM  HIV - Baseline HIV with dementia - Management per IM  Right pulmonary infiltrate on CXR - Leukocytosis and right pulmonary opacity noted on imaging - Antibiotics  and spirometry per IM    For questions or updates,  please contact [facility] Please consult [email or web address] for contact info under    Signed, Lesley LITTIE Maffucci, PA-C  07/30/2023 10:45 AM

## 2023-07-30 NOTE — Consult Note (Addendum)
 PHARMACY - ANTICOAGULATION CONSULT NOTE  Pharmacy Consult for IV Heparin  Indication: chest pain/ACS  Patient Measurements: Height: 5' 8 (172.7 cm) Weight: 50.5 kg (111 lb 5.3 oz) IBW/kg (Calculated) : 68.4 HEPARIN  DW (KG): 50.5  Labs: Recent Labs    07/29/23 1023 07/29/23 1157 07/29/23 2116 07/30/23 0518 07/30/23 1246  HGB 9.1*  --   --  7.9*  --   HCT 30.8*  --   --  26.1*  --   PLT 162  --   --  122*  --   APTT  --  32  --   --   --   LABPROT  --  16.2*  --   --   --   INR  --  1.3*  --   --   --   HEPARINUNFRC  --   --  <0.10* 0.14* 0.14*  CREATININE 2.83*  --   --  2.71*  --   TROPONINIHS 446* 511*  --   --   --     Estimated Creatinine Clearance: 20.2 mL/min (A) (by C-G formula based on SCr of 2.71 mg/dL (H)).   Medical History: Past Medical History:  Diagnosis Date   Acute renal failure (HCC)    Altered mental status 01/20/2012   Anemia    Brain lesion    Cholelithiasis    Chronic indwelling Foley catheter    CKD (chronic kidney disease) stage 3, GFR 30-59 ml/min (HCC)    COPD (chronic obstructive pulmonary disease) (HCC)    Diabetic neuropathy (HCC)    DKA (diabetic ketoacidosis) (HCC)    DM (diabetes mellitus), type 2 (HCC)    Elevated PSA    Gait instability    uses walker   H/O urinary retention    Hepatitis C infection    s/p Harvoni   History of cocaine use    last used in 2012   HIV (human immunodeficiency virus infection) (HCC)    HIV dementia (HCC)    Hypertension    IPMN (intraductal papillary mucinous neoplasm)    Lives in group home    [facility]   Metabolic acidosis    Pancreatic cyst    Pneumonia    Thrombocytopenia (HCC)    Tobacco use     Medications:  No apparent anticoagulation prior to admission; med reconciliation is pending  Assessment: 63 y/o M with medical history as above presenting to the ED 07/29/23 with chest pain. Troponin is elevated. Pharmacy consulted to initiate and manage heparin   infusion for ACS and acute PE.  Baseline aPTT and INR are penidng. Baseline CBC notable for anemia which appears consistent with patient's baseline.  Goal of Therapy:  Heparin  level 0.3-0.7 units/ml Monitor platelets by anticoagulation protocol: Yes   Date Time Results Comments 06/23 2116 HL < 0.10 subtherapeutic @ 600 units/hr 06/24 0518 HL 0.14 Subtherapeutic @750  units/hr 06/24 1246 HL 0.14 Subtherapeutic @ 900 units/hr  Plan:  HL remains subtherapeutic, will order heparin  1500 units IV bolus x 1 Increase heparin  infusion to 1050 units/hr Recheck heparin  level 8 hours after rate change CBC daily while on heparin   Kyra Laffey Rodriguez-Guzman PharmD, BCPS 07/30/2023 1:40 PM

## 2023-07-31 ENCOUNTER — Inpatient Hospital Stay

## 2023-07-31 DIAGNOSIS — I2699 Other pulmonary embolism without acute cor pulmonale: Secondary | ICD-10-CM | POA: Diagnosis not present

## 2023-07-31 LAB — GLUCOSE, CAPILLARY
Glucose-Capillary: 160 mg/dL — ABNORMAL HIGH (ref 70–99)
Glucose-Capillary: 170 mg/dL — ABNORMAL HIGH (ref 70–99)
Glucose-Capillary: 146 mg/dL — ABNORMAL HIGH (ref 70–99)
Glucose-Capillary: 125 mg/dL — ABNORMAL HIGH (ref 70–99)

## 2023-07-31 LAB — CBC
HCT: 23.8 % — ABNORMAL LOW (ref 39.0–52.0)
Hemoglobin: 7.3 g/dL — ABNORMAL LOW (ref 13.0–17.0)
MCH: 28.1 pg (ref 26.0–34.0)
MCHC: 30.7 g/dL (ref 30.0–36.0)
MCV: 91.5 fL (ref 80.0–100.0)
Platelets: 121 10*3/uL — ABNORMAL LOW (ref 150–400)
RBC: 2.6 MIL/uL — ABNORMAL LOW (ref 4.22–5.81)
RDW: 14 % (ref 11.5–15.5)
WBC: 7.2 10*3/uL (ref 4.0–10.5)
nRBC: 0 % (ref 0.0–0.2)

## 2023-07-31 LAB — BASIC METABOLIC PANEL WITH GFR
Anion gap: 8 (ref 5–15)
BUN: 34 mg/dL — ABNORMAL HIGH (ref 8–23)
CO2: 23 mmol/L (ref 22–32)
Calcium: 8.5 mg/dL — ABNORMAL LOW (ref 8.9–10.3)
Chloride: 111 mmol/L (ref 98–111)
Creatinine, Ser: 2.56 mg/dL — ABNORMAL HIGH (ref 0.61–1.24)
GFR, Estimated: 28 mL/min — ABNORMAL LOW (ref >60)
Glucose, Bld: 166 mg/dL — ABNORMAL HIGH (ref 70–99)
Potassium: 3.7 mmol/L (ref 3.5–5.1)
Sodium: 142 mmol/L (ref 135–145)

## 2023-07-31 LAB — MAGNESIUM: Magnesium: 2 mg/dL (ref 1.7–2.4)

## 2023-07-31 NOTE — TOC Initial Note (Signed)
 Transition of Care [facility]) - Initial/Assessment Note    Patient Details  Name: Jay Flores MRN: [id number] Date of Birth: 1961-01-06  Transition [facility] CM/SW Contact:    Lauraine Jay Carpen, LCSW Phone Number: 07/31/2023, 9:46 AM  Clinical Narrative:  CSW reviewed chart. Patient is from [facility]. CSW called one of the owners, [employer], and confirmed. Patient is wheelchair bound but does walk some with a walker. Medications will need to be sent to [facility]. No further concerns. CSW will continue to follow patient for support and facilitate return to group home once stable. Group home staff will transport at discharge.               Expected Discharge Plan: Group Home Barriers to Discharge: Continued Medical Work up   Patient Goals and CMS Choice            Expected Discharge Plan and Services     Post Acute Care Choice: Resumption of Svcs/PTA Provider Living arrangements for the past 2 months: Group Home                                      Prior Living Arrangements/Services Living arrangements for the past 2 months: Group Home Lives with:: Facility Resident Patient language and need for interpreter reviewed:: Yes        Need for Family Participation in Patient Care: Yes (Comment) Care giver support system in place?: Yes (comment) Current home services: DME Criminal Activity/Legal Involvement Pertinent to Current Situation/Hospitalization: No - Comment as needed  Activities of Daily Living   ADL Screening (condition at time of admission) Independently performs ADLs?: No Does the patient have a NEW difficulty with bathing/dressing/toileting/self-feeding that is expected to last >3 days?: No Does the patient have a NEW difficulty with getting in/out of bed, walking, or climbing stairs that is expected to last >3 days?: No Does the patient have a NEW difficulty with communication that is expected to last >3 days?: No Is the patient  deaf or have difficulty hearing?: No Does the patient have difficulty seeing, even when wearing glasses/contacts?: No Does the patient have difficulty concentrating, remembering, or making decisions?: Yes  Permission Sought/Granted Permission sought to share information with : Photographer granted to share info w AGENCY: [facility]        Emotional Assessment   Attitude/Demeanor/Rapport: Unable to Assess Affect (typically observed): Unable to Assess Orientation: : Oriented to Self, Oriented to Place Alcohol / Substance Use: Not Applicable Psych Involvement: No (comment)  Admission diagnosis:  NSTEMI (non-ST elevated myocardial infarction) [facility]) [I21.4] Patient Active Problem List   Diagnosis Date Noted   NSTEMI (non-ST elevated myocardial infarction) (HCC) 07/29/2023   Diabetes mellitus type 2, noninsulin dependent (HCC) 07/29/2023   CKD (chronic kidney disease) stage 4, GFR 15-29 ml/min (HCC) 07/29/2023   Leukocytosis 07/29/2023   Right pulmonary infiltrate on CXR 07/29/2023   Community acquired pneumonia 07/06/2023   Chest discomfort 07/06/2023   Depression 07/06/2023   Symptomatic anemia 04/24/2023   HLD (hyperlipidemia) 04/24/2023   Syncope 04/24/2023   Acute renal failure superimposed on stage 3b chronic kidney disease (HCC) 04/24/2023   Type II diabetes mellitus with renal manifestations (HCC) 04/24/2023   Prostate cancer (HCC) 04/24/2023   Elevated lactic acid level 04/24/2023   Pancreatic cyst 03/23/2022   Thrombocytopenia (HCC) 03/23/2022  Callus 11/23/2021   Pain due to onychomycosis of toenails of both feet 06/15/2021   Diabetic neuropathy (HCC) 06/15/2021   Bladder dysfunction 05/07/2021   H/O urinary retention 05/07/2021   Tobacco use 04/17/2018   DKA (diabetic ketoacidosis) (HCC) 03/29/2017   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   Leg weakness, bilateral 01/18/2016   Difficulty walking 12/19/2015   Hyperreflexia  of lower extremity 12/19/2015   Muscle spasm of both lower legs 12/19/2015   HTN (hypertension) 02/19/2012   Weakness 02/19/2012   Metabolic acidosis 01/27/2012   Hypokalemia 01/23/2012   PNA (pneumonia) 01/22/2012   HIV (human immunodeficiency virus infection) (HCC) 01/22/2012   Brain lesion 01/22/2012   Acute renal failure (HCC) 01/22/2012   Anemia 01/22/2012   Altered mental status 01/22/2012   Hepatitis C infection 10/05/2011   PCP:  [name], MD Pharmacy:   [facility] - [place], [place] - [address] [place] [address] Phone: [phone] Fax: [phone]  [facility] [address] [place] [address] Phone: [phone] Fax: [phone]     Social Drivers of Health (SDOH) Social History: SDOH Screenings   Food Insecurity: No Food Insecurity (07/29/2023)  Housing: Low Risk  (07/29/2023)  Transportation Needs: No Transportation Needs (07/29/2023)  Utilities: Not At Risk (07/29/2023)  Depression (PHQ2-9): Low Risk  (03/23/2022)  Tobacco Use: High Risk (07/29/2023)   SDOH Interventions:     Readmission Risk Interventions    07/31/2023    9:41 AM  Readmission Risk Prevention Plan  Transportation Screening Complete  Medication Review (RN Care Manager) Complete  PCP or Specialist appointment within 3-5 days of discharge Complete  SW Recovery Care/Counseling Consult Complete  Palliative Care Screening Not Applicable  Skilled Nursing Facility Not Applicable

## 2023-07-31 NOTE — Plan of Care (Signed)

## 2023-07-31 NOTE — Progress Notes (Signed)
 PROGRESS NOTE    Jay Flores  FMW:[id number] DOB: 1960-02-08 DOA: 07/29/2023 PCP: [name], MD   Assessment & Plan:   Principal Problem:   NSTEMI (non-ST elevated myocardial infarction) [facility]) Active Problems:   HIV (human immunodeficiency virus infection) (HCC)   Symptomatic anemia   Prostate cancer (HCC)   Tobacco use   HTN (hypertension)   HLD (hyperlipidemia)   Type II diabetes mellitus with renal manifestations (HCC)   Diabetic neuropathy (HCC)   Pancreatic cyst   Hepatitis C infection   HIV dementia (HCC)   Weakness   Depression   Diabetes mellitus type 2, noninsulin dependent (HCC)   CKD (chronic kidney disease) stage 4, GFR 15-29 ml/min (HCC)   Leukocytosis   Right pulmonary infiltrate on CXR  Assessment and Plan: Acute pulmonary embolism: w/ LLE DVT. Continue on eliquis. Echo shows EF 60-65%, diastolic function could not be evaluated, RV function is moderately reduced & could not evaluate regional wall motion.    Hx of hematuria: w/ hx of prostate cancer s/p radiation. Will monitor pt on eliquis & hx of hematuria    Chronic anemia: continue folic acid  supplementation. Will transfuse if Hb < 7.0    NSTEMI: ruled out. Likely demand ischemia from acute PE    Right pulmonary infiltrate: on CXR. Likely secondary to acute PE. Abx were already d/c   HIV: w/ HIV dementia. Continue on home dose of juluca     HLD: continue on statin   Tobacco use: received alcohol cessation counseling x 5 mins   CKDIV: Cr is labile. Avoid nephrotoxic meds    DM2: likely poorly controlled. Continue on SSI w/ accuchecks   Depression: severity unknown. Continue on home dose of vilazodone     Hx of HCV: s/p harvoni    Likely severe protein calorie malnutrition: encourage po intake. RD consulted      DVT prophylaxis: eliquis Code Status: full  Family Communication:  Disposition Plan: likely d/c back to home facility  Status is: Inpatient Remains inpatient  appropriate because: severity of illness    Level of care: Med-Surg Consultants:    Procedures:   Antimicrobials:    Subjective: Pt c/o malaise   Objective: Vitals:   07/30/23 1503 07/30/23 2000 07/31/23 0514 07/31/23 0857  BP: 98/84 (!) 143/74 120/69 123/75  Pulse: 89 78 95 87  Resp: 16 16 16 18   Temp: 97.9 F (36.6 C) 98.4 F (36.9 C) 97.9 F (36.6 C) 97.9 F (36.6 C)  TempSrc: Oral Oral Oral   SpO2: 100% 100% 100% 100%  Weight:      Height:        Intake/Output Summary (Last 24 hours) at 07/31/2023 1201 Last data filed at 07/30/2023 2200 Gross per 24 hour  Intake 1339.47 ml  Output 600 ml  Net 739.47 ml   Filed Weights   07/29/23 1002 07/29/23 2052  Weight: 49.2 kg 50.5 kg    Examination:  General exam: Appears calm and comfortable  Respiratory system: Clear to auscultation. Respiratory effort normal. Cardiovascular system: S1 & S2+. No rubs, gallops or clicks. Gastrointestinal system: Abdomen is nondistended, soft and nontender.  Normal bowel sounds heard. Central nervous system: Alert and awake. Moves all extremities Psychiatry: Judgement and insight appears at baseline. Flat mood and affect     Data Reviewed: I have personally reviewed following labs and imaging studies  CBC: Recent Labs  Lab 07/29/23 1023 07/30/23 0518 07/31/23 0503  WBC 12.4* 7.3 7.2  HGB 9.1* 7.9* 7.3*  HCT  30.8* 26.1* 23.8*  MCV 95.7 92.6 91.5  PLT 162 122* 121*   Basic Metabolic Panel: Recent Labs  Lab 07/29/23 1023 07/30/23 0518 07/31/23 0503  NA 141 142 142  K 4.4 3.8 3.7  CL 110 110 111  CO2 22 28 23   GLUCOSE 218* 140* 166*  BUN 33* 35* 34*  CREATININE 2.83* 2.71* 2.56*  CALCIUM  9.4 8.9 8.5*  MG  --   --  2.0   GFR: Estimated Creatinine Clearance: 21.4 mL/min (A) (by C-G formula based on SCr of 2.56 mg/dL (H)). Liver Function Tests: No results for input(s): AST, ALT, ALKPHOS, BILITOT, PROT, ALBUMIN in the last 168 hours. No results for  input(s): LIPASE, AMYLASE in the last 168 hours. No results for input(s): AMMONIA in the last 168 hours. Coagulation Profile: Recent Labs  Lab 07/29/23 1157  INR 1.3*   Cardiac Enzymes: No results for input(s): CKTOTAL, CKMB, CKMBINDEX, TROPONINI in the last 168 hours. BNP (last 3 results) No results for input(s): PROBNP in the last 8760 hours. HbA1C: No results for input(s): HGBA1C in the last 72 hours. CBG: Recent Labs  Lab 07/30/23 1118 07/30/23 1635 07/30/23 2119 07/31/23 0857 07/31/23 1123  GLUCAP 112* 163* 161* 160* 170*   Lipid Profile: No results for input(s): CHOL, HDL, LDLCALC, TRIG, CHOLHDL, LDLDIRECT in the last 72 hours. Thyroid Function Tests: No results for input(s): TSH, T4TOTAL, FREET4, T3FREE, THYROIDAB in the last 72 hours. Anemia Panel: No results for input(s): VITAMINB12, FOLATE, FERRITIN, TIBC, IRON, RETICCTPCT in the last 72 hours. Sepsis Labs: Recent Labs  Lab 07/30/23 1246  PROCALCITON 0.44    No results found for this or any previous visit (from the past 240 hours).       Radiology Studies: US  Venous Img Lower Bilateral (DVT) Result Date: 07/31/2023 CLINICAL DATA:  Pulmonary embolism.  Assess for residual DVT. EXAM: BILATERAL LOWER EXTREMITY VENOUS DOPPLER ULTRASOUND TECHNIQUE: Gray-scale sonography with graded compression, as well as color Doppler and duplex ultrasound were performed to evaluate the lower extremity deep venous systems from the level of the common femoral vein and including the common femoral, femoral, profunda femoral, popliteal and calf veins including the posterior tibial, peroneal and gastrocnemius veins when visible. The superficial great saphenous vein was also interrogated. Spectral Doppler was utilized to evaluate flow at rest and with distal augmentation maneuvers in the common femoral, femoral and popliteal veins. COMPARISON:  None Available. FINDINGS: RIGHT LOWER  EXTREMITY Common Femoral Vein: No evidence of thrombus. Normal compressibility, respiratory phasicity and response to augmentation. Saphenofemoral Junction: No evidence of thrombus. Normal compressibility and flow on color Doppler imaging. Profunda Femoral Vein: No evidence of thrombus. Normal compressibility and flow on color Doppler imaging. Femoral Vein: No evidence of thrombus. Normal compressibility, respiratory phasicity and response to augmentation. Popliteal Vein: No evidence of thrombus. Normal compressibility, respiratory phasicity and response to augmentation. Calf Veins: No evidence of thrombus. Normal compressibility and flow on color Doppler imaging. Superficial Great Saphenous Vein: No evidence of thrombus. Normal compressibility. Venous Reflux:  None. Other Findings:  None. LEFT LOWER EXTREMITY Common Femoral Vein: No evidence of thrombus. Normal compressibility, respiratory phasicity and response to augmentation. Saphenofemoral Junction: No evidence of thrombus. Normal compressibility and flow on color Doppler imaging. Profunda Femoral Vein: No evidence of thrombus. Normal compressibility and flow on color Doppler imaging. Femoral Vein: Noncompressibility of the femoral vein in the distal thigh. The lumen is expanded and filled with low-level internal echoes. No evidence of color flow on color Doppler imaging.  Findings suggest acute occlusive thrombus. Popliteal Vein: Acute occlusive thrombus extends through the popliteal vein. Calf Veins: Acute occlusive thrombus extends into the peroneal veins. Superficial Great Saphenous Vein: No evidence of thrombus. Normal compressibility. Venous Reflux:  None. Other Findings:  None. IMPRESSION: 1. Positive for acute appearing and occlusive thrombus within the femoral vein in the left distal thigh, the left popliteal vein and the left peroneal veins. 2. No evidence of DVT in the right lower extremity. Electronically Signed   By: Wilkie Lent M.D.   On:  07/31/2023 09:42   ECHOCARDIOGRAM COMPLETE Result Date: 07/30/2023    ECHOCARDIOGRAM REPORT   Patient Name:   Jay Flores Date of Exam: 07/30/2023 Medical Rec #:  [id number]         Height:       68.0 in Accession #:    [id number]        Weight:       111.3 lb Date of Birth:  [birth year]          BSA:          1.593 m Patient Age:    63 years          BP:           105/61 mmHg Patient Gender: M                 HR:           75 bpm. Exam Location:  [facility] Procedure: 2D Echo, Cardiac Doppler and Color Doppler (Both Spectral and Color            Flow Doppler were utilized during procedure). Indications:     NSTEMI I21.4                  Chest pain R07.9  History:         Patient has no prior history of Echocardiogram examinations.                  COPD; Risk Factors:Hypertension. Tobacco use.  Sonographer:     Christopher Furnace Referring Phys:  [id number] AMY N COX Diagnosing Phys: Lonni End MD  Sonographer Comments: Technically challenging study due to limited acoustic windows and no apical window. Image acquisition challenging due to COPD and Image acquisition challenging due to patient body habitus. IMPRESSIONS  1. Left ventricular ejection fraction, by estimation, is 60 to 65%. The left ventricle has normal function. Left ventricular endocardial border not optimally defined to evaluate regional wall motion. Left ventricular diastolic function could not be evaluated.  2. Right ventricular systolic function is moderately reduced. The right ventricular size is normal. There is normal pulmonary artery systolic pressure.  3. The mitral valve is normal in structure. Trivial mitral valve regurgitation.  4. The aortic valve is tricuspid. There is moderate thickening of the aortic valve. Aortic valve regurgitation is mild. Aortic valve gradient not assessed due to absent apical windows.  5. The inferior vena cava is normal in size with greater than 50% respiratory variability, suggesting right atrial pressure of 3 mmHg.  FINDINGS  Left Ventricle: Left ventricular ejection fraction, by estimation, is 60 to 65%. The left ventricle has normal function. Left ventricular endocardial border not optimally defined to evaluate regional wall motion. The left ventricular internal cavity size was normal in size. There is no left ventricular hypertrophy. Left ventricular diastolic function could not be evaluated. Right Ventricle: The right ventricular size is normal. No increase in right ventricular wall thickness.  Right ventricular systolic function is moderately reduced. There is normal pulmonary artery systolic pressure. The tricuspid regurgitant velocity is 2.10 m/s, and with an assumed right atrial pressure of 3 mmHg, the estimated right ventricular systolic pressure is 20.7 mmHg. Left Atrium: Left atrial size was not well visualized. Right Atrium: Right atrial size was not well visualized. Pericardium: There is no evidence of pericardial effusion. Mitral Valve: The mitral valve is normal in structure. Trivial mitral valve regurgitation. Tricuspid Valve: The tricuspid valve is normal in structure. Tricuspid valve regurgitation is mild. Aortic Valve: The aortic valve is tricuspid. There is moderate thickening of the aortic valve. Aortic valve regurgitation is mild. Aortic valve gradient not assessed due to absent apical windows. Pulmonic Valve: The pulmonic valve was thickened with good excursion. Pulmonic valve regurgitation is trivial. No evidence of pulmonic stenosis. Aorta: The aortic root is normal in size and structure. Venous: The inferior vena cava is normal in size with greater than 50% respiratory variability, suggesting right atrial pressure of 3 mmHg. IAS/Shunts: No atrial level shunt detected by color flow Doppler.  LEFT VENTRICLE PLAX 2D LVIDd:         3.30 cm LVIDs:         2.40 cm LV PW:         0.80 cm LV IVS:        1.00 cm LVOT diam:     2.10 cm LVOT Area:     3.46 cm  LEFT ATRIUM         Index LA diam:    2.40 cm 1.51  cm/m   AORTA Ao Root diam: 3.40 cm TRICUSPID VALVE TR Peak grad:   17.7 mmHg TR Vmax:        210.15 cm/s  SHUNTS Systemic Diam: 2.10 cm Lonni Hanson MD Electronically signed by Lonni Hanson MD Signature Date/Time: 07/30/2023/1:24:06 PM    Final    NM Pulmonary Perfusion Result Date: 07/29/2023 CLINICAL DATA:  Short of breath.  Concern for pulmonary embolism EXAM: NUCLEAR MEDICINE PERFUSION LUNG SCAN TECHNIQUE: Perfusion images were obtained in multiple projections after intravenous injection of radiopharmaceutical. RADIOPHARMACEUTICALS:  4.4 mCi Tc-57m MAA COMPARISON:  Chest radiograph 07/29/2018 FINDINGS: Wedge-shaped peripheral perfusion defect within the lateral RIGHT lower lobe and RIGHT middle lobe. No perfusion defects in LEFT lung. Subtle opacity in the RIGHT lung base. Perfusion defect greater than the opacity volume. Potential pulmonary infarction radiograph. IMPRESSION: Wedge-shaped peripheral perfusion defects not matched on radiograph. Findings concerning for acute pulmonary embolism the RIGHT lower lobe. Electronically Signed   By: Jackquline Boxer M.D.   On: 07/29/2023 14:14        Scheduled Meds:  apixaban  10 mg Oral BID   Followed by   NOREEN ON 08/06/2023] apixaban  5 mg Oral BID   atorvastatin   40 mg Oral QHS   Chlorhexidine  Gluconate Cloth  6 each Topical Daily   cholecalciferol   1,000 Units Oral QHS   dolutegravir -rilpivirine   1 tablet Oral Daily   finasteride   5 mg Oral Daily   folic acid   1 mg Oral Daily   insulin  aspart  0-5 Units Subcutaneous QHS   insulin  aspart  0-9 Units Subcutaneous TID WC   metoprolol  succinate  100 mg Oral Daily   sodium bicarbonate   650 mg Oral BID   Vilazodone  HCl  20 mg Oral Daily   Continuous Infusions:   LOS: 2 days      Anthony CHRISTELLA Pouch, MD [facility] Pager [phone]  If 7PM-7AM,  please contact night-coverage [email or web address] 07/31/2023, 12:01 PM

## 2023-07-31 NOTE — Discharge Instructions (Addendum)
 Information on my medicine - ELIQUIS (apixaban)  This medication education was reviewed with me or my healthcare representative as part of my discharge preparation.  Why was Eliquis prescribed for you? Eliquis was prescribed to treat blood clots that may have been found in the veins of your legs (deep vein thrombosis) or in your lungs (pulmonary embolism) and to reduce the risk of them occurring again.  What do You need to know about Eliquis ? The starting dose is 10 mg (two 5 mg tablets) taken TWICE daily for the FIRST SEVEN (7) DAYS, then on (enter date)  08/06/23 in PM  the dose is reduced to ONE 5 mg tablet taken TWICE daily.  Eliquis may be taken with or without food.   Try to take the dose about the same time in the morning and in the evening. If you have difficulty swallowing the tablet whole please discuss with your pharmacist how to take the medication safely.  Take Eliquis exactly as prescribed and DO NOT stop taking Eliquis without talking to the doctor who prescribed the medication.  Stopping may increase your risk of developing a new blood clot.  Refill your prescription before you run out.  After discharge, you should have regular check-up appointments with your healthcare provider that is prescribing your Eliquis.    What do you do if you miss a dose? If a dose of ELIQUIS is not taken at the scheduled time, take it as soon as possible on the same day and twice-daily administration should be resumed. The dose should not be doubled to make up for a missed dose.  Important Safety Information A possible side effect of Eliquis is bleeding. You should call your healthcare provider right away if you experience any of the following: Bleeding from an injury or your nose that does not stop. Unusual colored urine (red or dark brown) or unusual colored stools (red or black). Unusual bruising for unknown reasons. A serious fall or if you hit your head (even if there is no  bleeding).  Some medicines may interact with Eliquis and might increase your risk of bleeding or clotting while on Eliquis. To help avoid this, consult your healthcare provider or pharmacist prior to using any new prescription or non-prescription medications, including herbals, vitamins, non-steroidal anti-inflammatory drugs (NSAIDs) and supplements.  This website has more information on Eliquis (apixaban): http://www.eliquis.com/eliquis/home

## 2023-08-01 DIAGNOSIS — I2699 Other pulmonary embolism without acute cor pulmonale: Secondary | ICD-10-CM | POA: Diagnosis not present

## 2023-08-01 DIAGNOSIS — E43 Unspecified severe protein-calorie malnutrition: Secondary | ICD-10-CM | POA: Insufficient documentation

## 2023-08-01 LAB — GLUCOSE, CAPILLARY
Glucose-Capillary: 197 mg/dL — ABNORMAL HIGH (ref 70–99)
Glucose-Capillary: 156 mg/dL — ABNORMAL HIGH (ref 70–99)
Glucose-Capillary: 145 mg/dL — ABNORMAL HIGH (ref 70–99)
Glucose-Capillary: 156 mg/dL — ABNORMAL HIGH (ref 70–99)
Glucose-Capillary: 161 mg/dL — ABNORMAL HIGH (ref 70–99)

## 2023-08-01 LAB — CBC
HCT: 24.8 % — ABNORMAL LOW (ref 39.0–52.0)
Hemoglobin: 7.7 g/dL — ABNORMAL LOW (ref 13.0–17.0)
MCH: 28.4 pg (ref 26.0–34.0)
MCHC: 31 g/dL (ref 30.0–36.0)
MCV: 91.5 fL (ref 80.0–100.0)
Platelets: 126 10*3/uL — ABNORMAL LOW (ref 150–400)
RBC: 2.71 MIL/uL — ABNORMAL LOW (ref 4.22–5.81)
RDW: 14.1 % (ref 11.5–15.5)
WBC: 7.3 10*3/uL (ref 4.0–10.5)
nRBC: 0 % (ref 0.0–0.2)

## 2023-08-01 LAB — BASIC METABOLIC PANEL WITH GFR
Anion gap: 7 (ref 5–15)
BUN: 39 mg/dL — ABNORMAL HIGH (ref 8–23)
CO2: 22 mmol/L (ref 22–32)
Calcium: 8.8 mg/dL — ABNORMAL LOW (ref 8.9–10.3)
Chloride: 111 mmol/L (ref 98–111)
Creatinine, Ser: 2.46 mg/dL — ABNORMAL HIGH (ref 0.61–1.24)
GFR, Estimated: 29 mL/min — ABNORMAL LOW (ref >60)
Glucose, Bld: 139 mg/dL — ABNORMAL HIGH (ref 70–99)
Potassium: 3.9 mmol/L (ref 3.5–5.1)
Sodium: 140 mmol/L (ref 135–145)

## 2023-08-01 LAB — MAGNESIUM: Magnesium: 1.9 mg/dL (ref 1.7–2.4)

## 2023-08-01 MED ORDER — ADULT MULTIVITAMIN W/MINERALS CH
1.0000 | ORAL_TABLET | Freq: Every day | ORAL | Status: DC
  Administered 2023-08-01: 1
  Filled 2023-08-01: qty 1

## 2023-08-01 MED ORDER — GLUCERNA SHAKE PO LIQD
237.0000 mL | Freq: Three times a day (TID) | ORAL | Status: DC
  Administered 2023-08-01 – 2023-08-03 (×7): 237 mL via ORAL

## 2023-08-01 MED ORDER — ADULT MULTIVITAMIN W/MINERALS CH
1.0000 | ORAL_TABLET | Freq: Every day | ORAL | Status: DC
  Administered 2023-08-02 – 2023-08-03 (×2): 1 via ORAL
  Filled 2023-08-01 (×2): qty 1

## 2023-08-01 NOTE — Progress Notes (Signed)
 Initial Nutrition Assessment  DOCUMENTATION CODES:   Underweight, Severe malnutrition in context of chronic illness  INTERVENTION:   -Glucerna Shake po TID, each supplement provides 220 kcal and 10 grams of protein  -MVI with minerals daily -Liberalize diet to carb modified for wider variety of meal selections  NUTRITION DIAGNOSIS:   Severe Malnutrition related to chronic illness (HIV) as evidenced by moderate fat depletion, severe fat depletion, moderate muscle depletion, severe muscle depletion, percent weight loss.  GOAL:   Patient will meet greater than or equal to 90% of their needs  MONITOR:   PO intake, Supplement acceptance  REASON FOR ASSESSMENT:   Consult Assessment of nutrition requirement/status  ASSESSMENT:   Pt with history of HIV, HIV dementia, currently on triple therapy, hyperlipidemia, hep C status post Harvonia treatment, NIDDM2, CKD stage IV, intraductal papillary mucinous neoplasm, BPH, who presents for chief concerns of left-sided chest pain.  Pt admitted with acute PE.   Reviewed I/O's: -1.3 L x 24 hours and -781 ml since admission  UOP: 2.3 L x 24 hours  Per MD notes, nuclear scan revealed acute PE in rt lower lobe.   Pt sleeping soundly at time of visit, however, woke when name was called. Pt reports feeling better today. He confirms that he is a resident of [facility]. Per TOC notes, pt is primarily wheelchair bound but uses a walker occasionally.    Pt reports good appetite PTA. He denies any chewing or swallowing issues. Pt unable to provide a diet recall. When asked he drinks supplements, he replied oh, yeah, I do all that. Noted meal completions 100%.   Pt denies any weight loss. Reviewed wt hx; pt has experienced a 18.2% wt loss over the past 3 months, which is significant for time frame.   Discussed importance of good meal and supplement intake to promote healing.   Medications reviewed and include vitamin D3 and folic acid .    Lab Results  Component Value Date   HGBA1C 6.3 (H) 02/21/2023   PTA DM medications are 500 mg metformin daily.   Labs reviewed: CBGS: 125-160 (inpatient orders for glycemic control are 0-5 units insulin  aspart daily at bedtime, 0-9 units insulin  aspart TID with meals).    NUTRITION - FOCUSED PHYSICAL EXAM:  Flowsheet Row Most Recent Value  Orbital Region Severe depletion  Upper Arm Region Moderate depletion  Thoracic and Lumbar Region Severe depletion  Buccal Region Severe depletion  Temple Region Severe depletion  Clavicle Bone Region Severe depletion  Clavicle and Acromion Bone Region Severe depletion  Scapular Bone Region Severe depletion  Dorsal Hand Moderate depletion  Patellar Region Severe depletion  Anterior Thigh Region Severe depletion  Posterior Calf Region Severe depletion  Edema (RD Assessment) None  Hair Reviewed  Eyes Reviewed  Mouth Reviewed  Skin Reviewed  Nails Reviewed    Diet Order:   Diet Order             Diet heart healthy/carb modified Room service appropriate? Yes; Fluid consistency: Thin  Diet effective now                   EDUCATION NEEDS:   Education needs have been addressed  Skin:  Skin Assessment: Reviewed RN Assessment  Last BM:  08/01/23 (type 2)  Height:   Ht Readings from Last 1 Encounters:  07/29/23 5' 8 (1.727 m)    Weight:   Wt Readings from Last 1 Encounters:  07/29/23 50.5 kg    Ideal Body  Weight:  70 kg  BMI:  Body mass index is 16.93 kg/m.  Estimated Nutritional Needs:   Kcal:  2000-2200  Protein:  105-120 grams  Fluid:  2.0-2.2 L    Margery ORN, RD, LDN, CDCES Registered Dietitian III Certified Diabetes Care and Education Specialist If unable to reach this RD, please use RD Inpatient group chat on secure chat between hours of 8am-4 pm daily

## 2023-08-01 NOTE — Progress Notes (Signed)
 PROGRESS NOTE    [facility]  FMW:[id number] DOB: [birth year] DOA: 07/29/2023 PCP: [name], MD   Assessment & Plan:   Principal Problem:   NSTEMI (non-ST elevated myocardial infarction) [facility]) Active Problems:   HIV (human immunodeficiency virus infection) (HCC)   Symptomatic anemia   Prostate cancer (HCC)   Tobacco use   HTN (hypertension)   HLD (hyperlipidemia)   Type II diabetes mellitus with renal manifestations (HCC)   Diabetic neuropathy (HCC)   Pancreatic cyst   Hepatitis C infection   HIV dementia (HCC)   Weakness   Depression   Diabetes mellitus type 2, noninsulin dependent (HCC)   CKD (chronic kidney disease) stage 4, GFR 15-29 ml/min (HCC)   Leukocytosis   Right pulmonary infiltrate on CXR  Assessment and Plan: Acute pulmonary embolism: w/ LLE DVT. Continue on eliquis. Echo shows EF 60-65%, diastolic function could not be evaluated, RV function is moderately reduced & could not evaluate regional wall motion.    Hx of hematuria: w/ hx of prostate cancer s/p radiation. Hematuria present currently and will monitor while on eliquis    Chronic anemia: continue on folic acid  supplementation. Will transfuse if Hb < 7.0   Thrombocytopenia: etiology unclear. Labile    NSTEMI: ruled out. Likely demand ischemia from acute PE    Right pulmonary infiltrate: on CXR. Likely secondary to acute PE. Abx were already d/c   HIV: w/ HIV dementia. Continue on home dose of juluca     HLD: continue on statin   Tobacco use: received alcohol cessation counseling x 5 mins   CKDIV: Cr is trending down daily. Avoid nephrotoxic meds    DM2: likely poorly controlled. Continue on SSI w/ accuchecks   Depression: severity unknown. Continue on home dose of vilazodone     Hx of HCV: s/p harvoni   Likely severe protein calorie malnutrition: encourage po intake. Started on nutritional supplements     DVT prophylaxis: eliquis Code Status: full  Family Communication:   Disposition Plan: likely d/c back to home facility  Status is: Inpatient Remains inpatient appropriate because: likely d/c back to home facility tomorrow if H&H are stable and/or trending up     Level of care: Med-Surg Consultants:    Procedures:   Antimicrobials:    Subjective: Pt c/o fatigue   Objective: Vitals:   07/31/23 1628 07/31/23 2047 08/01/23 0441 08/01/23 0817  BP: 119/63 113/67 138/84 119/73  Pulse: 70 69 80 80  Resp: 18 20 16 16   Temp: 98.6 F (37 C) 98.6 F (37 C) 98.6 F (37 C) 98.5 F (36.9 C)  TempSrc:      SpO2: 100% 100% 100% 100%  Weight:      Height:        Intake/Output Summary (Last 24 hours) at 08/01/2023 0829 Last data filed at 08/01/2023 0445 Gross per 24 hour  Intake 960 ml  Output 2250 ml  Net -1290 ml   Filed Weights   07/29/23 1002 07/29/23 2052  Weight: 49.2 kg 50.5 kg    Examination:  General exam: Appears comfortable. Frail appearing   Respiratory system: clear breath sounds b/l  Cardiovascular system: S1/S2+. No rubs or clicks  Gastrointestinal system: Abd is soft, NT, ND & hypoactive bowel sounds  Central nervous system: alert & awake.  Psychiatry: judgement and insight appears at baseline. Flat mood and affect    Data Reviewed: I have personally reviewed following labs and imaging studies  CBC: Recent Labs  Lab 07/29/23 1023 07/30/23 0518  07/31/23 0503 08/01/23 0414  WBC 12.4* 7.3 7.2 7.3  HGB 9.1* 7.9* 7.3* 7.7*  HCT 30.8* 26.1* 23.8* 24.8*  MCV 95.7 92.6 91.5 91.5  PLT 162 122* 121* 126*   Basic Metabolic Panel: Recent Labs  Lab 07/29/23 1023 07/30/23 0518 07/31/23 0503 08/01/23 0414  NA 141 142 142 140  K 4.4 3.8 3.7 3.9  CL 110 110 111 111  CO2 22 28 23 22   GLUCOSE 218* 140* 166* 139*  BUN 33* 35* 34* 39*  CREATININE 2.83* 2.71* 2.56* 2.46*  CALCIUM  9.4 8.9 8.5* 8.8*  MG  --   --  2.0 1.9   GFR: Estimated Creatinine Clearance: 22.2 mL/min (A) (by C-G formula based on SCr of 2.46 mg/dL  (H)). Liver Function Tests: No results for input(s): AST, ALT, ALKPHOS, BILITOT, PROT, ALBUMIN in the last 168 hours. No results for input(s): LIPASE, AMYLASE in the last 168 hours. No results for input(s): AMMONIA in the last 168 hours. Coagulation Profile: Recent Labs  Lab 07/29/23 1157  INR 1.3*   Cardiac Enzymes: No results for input(s): CKTOTAL, CKMB, CKMBINDEX, TROPONINI in the last 168 hours. BNP (last 3 results) No results for input(s): PROBNP in the last 8760 hours. HbA1C: No results for input(s): HGBA1C in the last 72 hours. CBG: Recent Labs  Lab 07/31/23 0857 07/31/23 1123 07/31/23 1627 07/31/23 2138 08/01/23 0823  GLUCAP 160* 170* 146* 125* 197*   Lipid Profile: No results for input(s): CHOL, HDL, LDLCALC, TRIG, CHOLHDL, LDLDIRECT in the last 72 hours. Thyroid Function Tests: No results for input(s): TSH, T4TOTAL, FREET4, T3FREE, THYROIDAB in the last 72 hours. Anemia Panel: No results for input(s): VITAMINB12, FOLATE, FERRITIN, TIBC, IRON, RETICCTPCT in the last 72 hours. Sepsis Labs: Recent Labs  Lab 07/30/23 1246  PROCALCITON 0.44    No results found for this or any previous visit (from the past 240 hours).       Radiology Studies: US  Venous Img Lower Bilateral (DVT) Result Date: 07/31/2023 CLINICAL DATA:  Pulmonary embolism.  Assess for residual DVT. EXAM: BILATERAL LOWER EXTREMITY VENOUS DOPPLER ULTRASOUND TECHNIQUE: Gray-scale sonography with graded compression, as well as color Doppler and duplex ultrasound were performed to evaluate the lower extremity deep venous systems from the level of the common femoral vein and including the common femoral, femoral, profunda femoral, popliteal and calf veins including the posterior tibial, peroneal and gastrocnemius veins when visible. The superficial great saphenous vein was also interrogated. Spectral Doppler was utilized to evaluate flow at  rest and with distal augmentation maneuvers in the common femoral, femoral and popliteal veins. COMPARISON:  None Available. FINDINGS: RIGHT LOWER EXTREMITY Common Femoral Vein: No evidence of thrombus. Normal compressibility, respiratory phasicity and response to augmentation. Saphenofemoral Junction: No evidence of thrombus. Normal compressibility and flow on color Doppler imaging. Profunda Femoral Vein: No evidence of thrombus. Normal compressibility and flow on color Doppler imaging. Femoral Vein: No evidence of thrombus. Normal compressibility, respiratory phasicity and response to augmentation. Popliteal Vein: No evidence of thrombus. Normal compressibility, respiratory phasicity and response to augmentation. Calf Veins: No evidence of thrombus. Normal compressibility and flow on color Doppler imaging. Superficial Great Saphenous Vein: No evidence of thrombus. Normal compressibility. Venous Reflux:  None. Other Findings:  None. LEFT LOWER EXTREMITY Common Femoral Vein: No evidence of thrombus. Normal compressibility, respiratory phasicity and response to augmentation. Saphenofemoral Junction: No evidence of thrombus. Normal compressibility and flow on color Doppler imaging. Profunda Femoral Vein: No evidence of thrombus. Normal compressibility and flow on color  Doppler imaging. Femoral Vein: Noncompressibility of the femoral vein in the distal thigh. The lumen is expanded and filled with low-level internal echoes. No evidence of color flow on color Doppler imaging. Findings suggest acute occlusive thrombus. Popliteal Vein: Acute occlusive thrombus extends through the popliteal vein. Calf Veins: Acute occlusive thrombus extends into the peroneal veins. Superficial Great Saphenous Vein: No evidence of thrombus. Normal compressibility. Venous Reflux:  None. Other Findings:  None. IMPRESSION: 1. Positive for acute appearing and occlusive thrombus within the femoral vein in the left distal thigh, the left popliteal  vein and the left peroneal veins. 2. No evidence of DVT in the right lower extremity. Electronically Signed   By: Wilkie Lent M.D.   On: 07/31/2023 09:42        Scheduled Meds:  apixaban  10 mg Oral BID   Followed by   NOREEN ON 08/06/2023] apixaban  5 mg Oral BID   atorvastatin   40 mg Oral QHS   Chlorhexidine  Gluconate Cloth  6 each Topical Daily   cholecalciferol   1,000 Units Oral QHS   dolutegravir -rilpivirine   1 tablet Oral Daily   finasteride   5 mg Oral Daily   folic acid   1 mg Oral Daily   insulin  aspart  0-5 Units Subcutaneous QHS   insulin  aspart  0-9 Units Subcutaneous TID WC   metoprolol  succinate  100 mg Oral Daily   sodium bicarbonate   650 mg Oral BID   Vilazodone  HCl  20 mg Oral Daily   Continuous Infusions:   LOS: 3 days      Anthony CHRISTELLA Pouch, MD [facility] Pager [phone]  If 7PM-7AM, please contact night-coverage [email or web address] 08/01/2023, 8:29 AM

## 2023-08-01 NOTE — TOC Progression Note (Signed)
 Transition of Care [facility]) - Progression Note    Patient Details  Name: SAJAN CHEATWOOD MRN: [id number] Date of Birth: [birth year]  Transition of Care [facility]) CM/SW Contact  Lauraine [name], LCSW Phone Number: 08/01/2023, 11:33 AM  Clinical Narrative:   Per MD, likely discharge tomorrow. CSW sent secure email to group home owner to notify.  Expected Discharge Plan: Group Home Barriers to Discharge: Continued Medical Work up  Expected Discharge Plan and Services     Post Acute Care Choice: Resumption of Svcs/PTA Provider Living arrangements for the past 2 months: Group Home                                       Social Determinants of Health (SDOH) Interventions SDOH Screenings   Food Insecurity: No Food Insecurity (07/29/2023)  Housing: Low Risk  (07/29/2023)  Transportation Needs: No Transportation Needs (07/29/2023)  Utilities: Not At Risk (07/29/2023)  Depression (PHQ2-9): Low Risk  (03/23/2022)  Tobacco Use: High Risk (07/29/2023)    Readmission Risk Interventions    07/31/2023    9:41 AM  Readmission Risk Prevention Plan  Transportation Screening Complete  Medication Review (RN Care Manager) Complete  PCP or Specialist appointment within 3-5 days of discharge Complete  SW Recovery Care/Counseling Consult Complete  Palliative Care Screening Not Applicable  Skilled Nursing Facility Not Applicable

## 2023-08-02 DIAGNOSIS — I2699 Other pulmonary embolism without acute cor pulmonale: Secondary | ICD-10-CM | POA: Diagnosis not present

## 2023-08-02 LAB — GLUCOSE, CAPILLARY
Glucose-Capillary: 170 mg/dL — ABNORMAL HIGH (ref 70–99)
Glucose-Capillary: 149 mg/dL — ABNORMAL HIGH (ref 70–99)
Glucose-Capillary: 141 mg/dL — ABNORMAL HIGH (ref 70–99)
Glucose-Capillary: 141 mg/dL — ABNORMAL HIGH (ref 70–99)

## 2023-08-02 LAB — CBC
HCT: 23.1 % — ABNORMAL LOW (ref 39.0–52.0)
Hemoglobin: 7.2 g/dL — ABNORMAL LOW (ref 13.0–17.0)
MCH: 28.1 pg (ref 26.0–34.0)
MCHC: 31.2 g/dL (ref 30.0–36.0)
MCV: 90.2 fL (ref 80.0–100.0)
Platelets: 128 10*3/uL — ABNORMAL LOW (ref 150–400)
RBC: 2.56 MIL/uL — ABNORMAL LOW (ref 4.22–5.81)
RDW: 14 % (ref 11.5–15.5)
WBC: 7.4 10*3/uL (ref 4.0–10.5)
nRBC: 0 % (ref 0.0–0.2)

## 2023-08-02 LAB — BASIC METABOLIC PANEL WITH GFR
Anion gap: 7 (ref 5–15)
BUN: 42 mg/dL — ABNORMAL HIGH (ref 8–23)
CO2: 24 mmol/L (ref 22–32)
Calcium: 9 mg/dL (ref 8.9–10.3)
Chloride: 108 mmol/L (ref 98–111)
Creatinine, Ser: 2.3 mg/dL — ABNORMAL HIGH (ref 0.61–1.24)
GFR, Estimated: 31 mL/min — ABNORMAL LOW (ref >60)
Glucose, Bld: 141 mg/dL — ABNORMAL HIGH (ref 70–99)
Potassium: 4.3 mmol/L (ref 3.5–5.1)
Sodium: 139 mmol/L (ref 135–145)

## 2023-08-02 LAB — MAGNESIUM: Magnesium: 1.9 mg/dL (ref 1.7–2.4)

## 2023-08-02 NOTE — Plan of Care (Signed)

## 2023-08-02 NOTE — Progress Notes (Signed)
 PROGRESS NOTE    Jay Flores  FMW:[id number] DOB: [birth year] DOA: 07/29/2023 PCP: [name], MD   Assessment & Plan:   Principal Problem:   NSTEMI (non-ST elevated myocardial infarction) [facility]) Active Problems:   HIV (human immunodeficiency virus infection) (HCC)   Symptomatic anemia   Prostate cancer (HCC)   Tobacco use   HTN (hypertension)   HLD (hyperlipidemia)   Type II diabetes mellitus with renal manifestations (HCC)   Diabetic neuropathy (HCC)   Pancreatic cyst   Hepatitis C infection   HIV dementia (HCC)   Weakness   Depression   Diabetes mellitus type 2, noninsulin dependent (HCC)   CKD (chronic kidney disease) stage 4, GFR 15-29 ml/min (HCC)   Leukocytosis   Right pulmonary infiltrate on CXR   Protein-calorie malnutrition, severe  Assessment and Plan: Acute pulmonary embolism: w/ LLE DVT. Continue on eliquis . Echo shows EF 60-65%, diastolic function could not be evaluated, RV function is moderately reduced & could not evaluate regional wall motion.    Hx of hematuria: w/ hx of prostate cancer s/p radiation.  Hematuria present currently and will monitor while on eliquis     Chronic anemia: continue on folic acid . Will transfuse if Hb < 7.0  Thrombocytopenia: possibly secondary to HIV. Labile    NSTEMI: ruled out. Likely demand ischemia from acute PE    Right pulmonary infiltrate: on CXR. Likely secondary to acute PE. Abx were already d/c   HIV: w/ HIV dementia. Continue on home dose of juluca   HLD: continue on statin   Tobacco use: received alcohol cessation counseling x 5 mins   CKDIV: Cr is trending down again from day prior. Avoid nephrotoxic meds    DM2: likely poorly controlled. Continue on SSI w/ accuchecks  Depression: severity unknown. Continue on home dose of vilazodone     Hx of HCV: s/p harvoni   Likely severe protein calorie malnutrition: encourage po intake. Continue on nutritional supplements     DVT prophylaxis:  eliquis  Code Status: full  Family Communication:  Disposition Plan: likely d/c back to home facility  Status is: Inpatient Remains inpatient appropriate because: likely d/c back to home facility tomorrow if H&H are stable and/or trending up. H&H is trending down today and hematuria present    Level of care: Med-Surg Consultants:    Procedures:   Antimicrobials:    Subjective: Pt c/o malaise  Objective: Vitals:   08/01/23 2031 08/02/23 0315 08/02/23 0727 08/02/23 1616  BP: 117/69 117/61 120/67 109/62  Pulse: 73 70 76 66  Resp: 20 16 16 16   Temp: 97.9 F (36.6 C) 98.6 F (37 C) 98.2 F (36.8 C) 98 F (36.7 C)  TempSrc: Oral Oral Oral Oral  SpO2: 100% 100% 100% 100%  Weight:      Height:        Intake/Output Summary (Last 24 hours) at 08/02/2023 1737 Last data filed at 08/02/2023 1616 Gross per 24 hour  Intake 1220 ml  Output 2050 ml  Net -830 ml   Filed Weights   07/29/23 1002 07/29/23 2052  Weight: 49.2 kg 50.5 kg    Examination:  General exam: Appears comfortable.   Respiratory system: clear breath sounds b/l  Cardiovascular system: S1 & S2+ Gastrointestinal system: Abd is soft, NT, ND & hypoactive bowel sounds Central nervous system: alert & awake. Moves all extremities .  Psychiatry: judgement and insight appears at baseline. Flat mood and affect    Data Reviewed: I have personally reviewed following labs and imaging studies  CBC: Recent Labs  Lab 07/29/23 1023 07/30/23 0518 07/31/23 0503 08/01/23 0414 08/02/23 0345  WBC 12.4* 7.3 7.2 7.3 7.4  HGB 9.1* 7.9* 7.3* 7.7* 7.2*  HCT 30.8* 26.1* 23.8* 24.8* 23.1*  MCV 95.7 92.6 91.5 91.5 90.2  PLT 162 122* 121* 126* 128*   Basic Metabolic Panel: Recent Labs  Lab 07/29/23 1023 07/30/23 0518 07/31/23 0503 08/01/23 0414 08/02/23 0345  NA 141 142 142 140 139  K 4.4 3.8 3.7 3.9 4.3  CL 110 110 111 111 108  CO2 22 28 23 22 24   GLUCOSE 218* 140* 166* 139* 141*  BUN 33* 35* 34* 39* 42*   CREATININE 2.83* 2.71* 2.56* 2.46* 2.30*  CALCIUM  9.4 8.9 8.5* 8.8* 9.0  MG  --   --  2.0 1.9 1.9   GFR: Estimated Creatinine Clearance: 23.8 mL/min (A) (by C-G formula based on SCr of 2.3 mg/dL (H)). Liver Function Tests: No results for input(s): AST, ALT, ALKPHOS, BILITOT, PROT, ALBUMIN  in the last 168 hours. No results for input(s): LIPASE, AMYLASE in the last 168 hours. No results for input(s): AMMONIA in the last 168 hours. Coagulation Profile: Recent Labs  Lab 07/29/23 1157  INR 1.3*   Cardiac Enzymes: No results for input(s): CKTOTAL, CKMB, CKMBINDEX, TROPONINI in the last 168 hours. BNP (last 3 results) No results for input(s): PROBNP in the last 8760 hours. HbA1C: No results for input(s): HGBA1C in the last 72 hours. CBG: Recent Labs  Lab 08/01/23 2034 08/01/23 2126 08/02/23 0727 08/02/23 1134 08/02/23 1607  GLUCAP 156* 161* 170* 149* 141*   Lipid Profile: No results for input(s): CHOL, HDL, LDLCALC, TRIG, CHOLHDL, LDLDIRECT in the last 72 hours. Thyroid Function Tests: No results for input(s): TSH, T4TOTAL, FREET4, T3FREE, THYROIDAB in the last 72 hours. Anemia Panel: No results for input(s): VITAMINB12, FOLATE, FERRITIN, TIBC, IRON, RETICCTPCT in the last 72 hours. Sepsis Labs: Recent Labs  Lab 07/30/23 1246  PROCALCITON 0.44    No results found for this or any previous visit (from the past 240 hours).       Radiology Studies: No results found.       Scheduled Meds:  apixaban   10 mg Oral BID   Followed by   NOREEN ON 08/06/2023] apixaban   5 mg Oral BID   atorvastatin   40 mg Oral QHS   Chlorhexidine  Gluconate Cloth  6 each Topical Daily   cholecalciferol   1,000 Units Oral QHS   dolutegravir -rilpivirine   1 tablet Oral Daily   feeding supplement (GLUCERNA SHAKE)  237 mL Oral TID BM   finasteride   5 mg Oral Daily   folic acid   1 mg Oral Daily   insulin  aspart  0-5 Units  Subcutaneous QHS   insulin  aspart  0-9 Units Subcutaneous TID WC   metoprolol  succinate  100 mg Oral Daily   multivitamin with minerals  1 tablet Oral Daily   sodium bicarbonate   650 mg Oral BID   Vilazodone  HCl  20 mg Oral Daily   Continuous Infusions:   LOS: 4 days      Anthony CHRISTELLA Pouch, MD [facility] Pager [phone]  If 7PM-7AM, please contact night-coverage [email or web address] 08/02/2023, 5:37 PM

## 2023-08-02 NOTE — TOC Progression Note (Addendum)
 Transition of Care [facility]) - Progression Note    Patient Details  Name: Jay Flores MRN: [id number] Date of Birth: [birth year]  [facility]) CM/SW Contact  Lauraine [name], LCSW Phone Number: 08/02/2023, 9:49 AM  Clinical Narrative:   Left voicemail for group home owner to see if patient can return over the weekend.  10:54 am: Group home can take him back over the weekend.  Expected Discharge Plan: Group Home Barriers to Discharge: Continued Medical Work up  Expected Discharge Plan and Services     Post Acute Care Choice: Resumption of Svcs/PTA Provider Living arrangements for the past 2 months: Group Home                                       Social Determinants of Health (SDOH) Interventions SDOH Screenings   Food Insecurity: No Food Insecurity (07/29/2023)  Housing: Low Risk  (07/29/2023)  Transportation Needs: No Transportation Needs (07/29/2023)  Utilities: Not At Risk (07/29/2023)  Depression (PHQ2-9): Low Risk  (03/23/2022)  Tobacco Use: High Risk (07/29/2023)    Readmission Risk Interventions    07/31/2023    9:41 AM  Readmission Risk Prevention Plan  Transportation Screening Complete  Medication Review (RN Care Manager) Complete  PCP or Specialist appointment within 3-5 days of discharge Complete  SW Recovery Care/Counseling Consult Complete  Palliative Care Screening Not Applicable  Skilled Nursing Facility Not Applicable

## 2023-08-03 DIAGNOSIS — I2699 Other pulmonary embolism without acute cor pulmonale: Secondary | ICD-10-CM | POA: Diagnosis not present

## 2023-08-03 LAB — GLUCOSE, CAPILLARY
Glucose-Capillary: 152 mg/dL — ABNORMAL HIGH (ref 70–99)
Glucose-Capillary: 165 mg/dL — ABNORMAL HIGH (ref 70–99)

## 2023-08-03 LAB — CBC
HCT: 24.6 % — ABNORMAL LOW (ref 39.0–52.0)
Hemoglobin: 7.5 g/dL — ABNORMAL LOW (ref 13.0–17.0)
MCH: 27.5 pg (ref 26.0–34.0)
MCHC: 30.5 g/dL (ref 30.0–36.0)
MCV: 90.1 fL (ref 80.0–100.0)
Platelets: 152 10*3/uL (ref 150–400)
RBC: 2.73 MIL/uL — ABNORMAL LOW (ref 4.22–5.81)
RDW: 13.9 % (ref 11.5–15.5)
WBC: 7.6 10*3/uL (ref 4.0–10.5)
nRBC: 0 % (ref 0.0–0.2)

## 2023-08-03 LAB — BASIC METABOLIC PANEL WITH GFR
Anion gap: 9 (ref 5–15)
BUN: 39 mg/dL — ABNORMAL HIGH (ref 8–23)
CO2: 21 mmol/L — ABNORMAL LOW (ref 22–32)
Calcium: 9.4 mg/dL (ref 8.9–10.3)
Chloride: 106 mmol/L (ref 98–111)
Creatinine, Ser: 2.04 mg/dL — ABNORMAL HIGH (ref 0.61–1.24)
GFR, Estimated: 36 mL/min — ABNORMAL LOW (ref >60)
Glucose, Bld: 150 mg/dL — ABNORMAL HIGH (ref 70–99)
Potassium: 4.9 mmol/L (ref 3.5–5.1)
Sodium: 136 mmol/L (ref 135–145)

## 2023-08-03 LAB — MAGNESIUM: Magnesium: 2.1 mg/dL (ref 1.7–2.4)

## 2023-08-03 MED ORDER — APIXABAN 5 MG PO TABS: ORAL_TABLET | ORAL | 0 refills | Status: DC

## 2023-08-03 NOTE — TOC Progression Note (Signed)
 Transition of Care [facility]) - Progression Note    Patient Details  Name: DELLA HOMAN MRN: [id number] Date of Birth: August 09, 1960  [facility] of [facility]) CM/SW Contact  Delphine KANDICE Bring, RN Phone Number: 08/03/2023, 1:13 PM  Clinical Narrative:    CM spoke with Daphne and informed her that patient is being discharged today. She just asked for d/c  summary be emailed to her @ [email or web address]. They will provide transportation. Daphne will call CM back with the ETA of transportation.  CM notified MD and nurse   Expected Discharge Plan: Group Home Barriers to Discharge: Continued Medical Work up  Expected Discharge Plan and Services     Post Acute Care Choice: Resumption of Svcs/PTA Provider Living arrangements for the past 2 months: Group Home Expected Discharge Date: 08/03/23                                     Social Determinants of Health (SDOH) Interventions SDOH Screenings   Food Insecurity: No Food Insecurity (07/29/2023)  Housing: Low Risk  (07/29/2023)  Transportation Needs: No Transportation Needs (07/29/2023)  Utilities: Not At Risk (07/29/2023)  Depression (PHQ2-9): Low Risk  (03/23/2022)  Tobacco Use: High Risk (07/29/2023)    Readmission Risk Interventions    07/31/2023    9:41 AM  Readmission Risk Prevention Plan  Transportation Screening Complete  Medication Review (RN Care Manager) Complete  PCP or Specialist appointment within 3-5 days of discharge Complete  SW Recovery Care/Counseling Consult Complete  Palliative Care Screening Not Applicable  Skilled Nursing Facility Not Applicable

## 2023-08-03 NOTE — Discharge Summary (Signed)
 Physician Discharge Summary  Jay Flores FMW:[id number] DOB: 03/13/1960 DOA: 07/29/2023  PCP: [name], MD  Admit date: 07/29/2023 Discharge date: 08/03/2023  Admitted From: group home  Disposition:  group home   Recommendations for Outpatient Follow-up:  Follow up with PCP in 1-2 weeks F/u w/ urology in 1-2 weeks  F/u w/ cardio, Dr. Mady, in 2-3 weeks Get CBC within 1 week to re-check H&H   Home Health: Equipment/Devices:  Discharge Condition: stable  CODE STATUS: full  Diet recommendation:  Carb Modified   Brief/Interim Summary: HPI was taken from Dr. Sherre: Mr. 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   Vitals in the ED showed T of 97.9, rr 22, hr 75, blood pressure 109/77, SpO2 of 99% on room air.   Serum sodium is 141, potassium 4.4, chloride 110, bicarb 22, BUN of 33, serum creatinine of 2.83, EGFR 24, nonfasting blood glucose 218, WBC 12.4, hemoglobin 9.1, platelets of 162.   HS troponin was elevated at 446 and on repeat was 511.   Portable CXR in the ED was read as right lung base opacity.   ED treatment: Heparin  bolus and gtt., EDP consulted cardiology who states they will see the patient. -------------------------------------------- At bedside, patient is able to tell me his first and last name and current location.  He was not able to tell me his age or the current calendar year.  He states his birthday year is [birth year] and that his age is 63 years old.   He reports he had chest pain that started today points to the left side.  He reports he is never felt this way before.  He denies shortness of breath.  He denies trauma to his person.  He reports he does not know if he takes this medication, he states  that the facility just gives it to him.    Discharge Diagnoses:  Principal Problem:   NSTEMI (non-ST elevated myocardial infarction) (HCC) Active Problems:   HIV (human immunodeficiency virus infection) (HCC)   Symptomatic anemia   Prostate cancer (HCC)   Tobacco use   HTN (hypertension)   HLD (hyperlipidemia)   Type II diabetes mellitus with renal manifestations (HCC)   Diabetic neuropathy (HCC)   Pancreatic cyst   Hepatitis C infection   HIV dementia (HCC)   Weakness   Depression   Diabetes mellitus type 2, noninsulin dependent (HCC)   CKD (chronic kidney disease) stage 4, GFR 15-29 ml/min (HCC)   Leukocytosis   Right pulmonary infiltrate on CXR   Protein-calorie malnutrition, severe  Acute pulmonary embolism: w/ LLE DVT. Continue on eliquis . Echo shows EF 60-65%, diastolic function could not be evaluated, RV function is moderately reduced & could not evaluate regional wall motion.    Hx of hematuria: w/ hx of prostate cancer s/p radiation.  Hematuria present currently and will monitor while on eliquis . H&H are trending up today   Chronic foley: continue w/ supportive care    Chronic anemia: continue on folic acid . Will transfuse if Hb < 7.0  Thrombocytopenia: possibly secondary to HIV. WNL today    NSTEMI: ruled out. Likely demand ischemia from acute PE. F/u w/ cardio, Dr. Mady, in 2-3 weeks    Right pulmonary infiltrate: on CXR. Likely secondary to acute PE. Abx were already  d/c   HIV: w/ HIV dementia. Continue on home dose of juluca   HLD: continue on statin   Tobacco use: received alcohol cessation counseling x 5 mins   CKDIV: Cr is trending down again from day prior. Avoid nephrotoxic meds    DM2: likely poorly controlled. Restart home anti-DM meds at discharge   Depression: severity unknown. Continue on home dose of vilazodone     Hx of HCV: s/p harvoni   Likely severe protein calorie malnutrition: encourage po intake.   Discharge Instructions  Discharge  Instructions     Diet Carb Modified   Complete by: As directed    Discharge instructions   Complete by: As directed    F/u w/ PCP in 1-2 weeks. F/u w/ urology in 1-2 weeks   Increase activity slowly   Complete by: As directed       Allergies as of 08/03/2023   No Known Allergies      Medication List     TAKE these medications    Accu-Chek Aviva Plus test strip Generic drug: glucose blood 1 each by Other route in the morning, at noon, and at bedtime. Use as instructed   Accu-Chek Softclix Lancets lancets by Other route. Use as instructed   apixaban  5 MG Tabs tablet Commonly known as: ELIQUIS  Take 2 tablets (10 mg total) by mouth 2 (two) times daily for 4 days, THEN 1 tablet (5 mg total) 2 (two) times daily. Start taking on: August 03, 2023   atorvastatin  20 MG tablet Commonly known as: LIPITOR Take 20 mg by mouth at bedtime.   cholecalciferol  25 MCG (1000 UNIT) tablet Commonly known as: VITAMIN D3 Take 1,000 Units by mouth at bedtime.   finasteride  5 MG tablet Commonly known as: PROSCAR  Take 5 mg by mouth daily.   folic acid  1 MG tablet Commonly known as: FOLVITE  Take 1 tablet (1 mg total) by mouth daily.   FreeStyle Libre 14 Day Reader Espiridion Apply topically.   FreeStyle Libre 14 Day Sensor Misc Apply topically.   Jardiance 25 MG Tabs tablet Generic drug: empagliflozin Take 25 mg by mouth in the morning.   Juluca  50-25 MG tablet Generic drug: dolutegravir -rilpivirine  Take 1 tablet by mouth daily.   loperamide  2 MG capsule Commonly known as: IMODIUM  Take 2 mg by mouth 4 (four) times daily as needed for diarrhea or loose stools.   losartan  25 MG tablet Commonly known as: COZAAR  Take 25 mg by mouth daily.   metFORMIN 500 MG tablet Commonly known as: GLUCOPHAGE Take 500 mg by mouth daily with breakfast.   metoprolol  succinate 100 MG 24 hr tablet Commonly known as: TOPROL -XL Take 100 mg by mouth daily.   neomycin-bacitracin-polymyxin [phone]  Oint Apply 1 Application topically daily.   nicotine  21 mg/24hr patch Commonly known as: NICODERM CQ  - dosed in mg/24 hours Place 1 patch (21 mg total) onto the skin daily.   sodium bicarbonate  650 MG tablet Take 650 mg by mouth 2 (two) times daily.   sucralfate 1 g tablet Commonly known as: CARAFATE Take 1 g by mouth 2 (two) times daily.   Tylenol  8 Hour Arthritis Pain 650 MG CR tablet Generic drug: acetaminophen  Take 650 mg by mouth every 4 (four) hours as needed for pain.   Vilazodone  HCl 20 MG Tabs Take 20 mg by mouth daily.        Follow-up Information     [name], MD Follow up.   Specialty: Infectious Diseases Why: F/u in 1 week  Contact information: [address] [place] [address] [phone]         Mady Bruckner, MD Follow up.   Specialty: Cardiology Why: F/u in 2-3 weeks Contact information: [address] [place] [address] [phone]                No Known Allergies  Consultations: Cardio    Procedures/Studies: US  Venous Img Lower Bilateral (DVT) Result Date: 07/31/2023 CLINICAL DATA:  Pulmonary embolism.  Assess for residual DVT. EXAM: BILATERAL LOWER EXTREMITY VENOUS DOPPLER ULTRASOUND TECHNIQUE: Gray-scale sonography with graded compression, as well as color Doppler and duplex ultrasound were performed to evaluate the lower extremity deep venous systems from the level of the common femoral vein and including the common femoral, femoral, profunda femoral, popliteal and calf veins including the posterior tibial, peroneal and gastrocnemius veins when visible. The superficial great saphenous vein was also interrogated. Spectral Doppler was utilized to evaluate flow at rest and with distal augmentation maneuvers in the common femoral, femoral and popliteal veins. COMPARISON:  None Available. FINDINGS: RIGHT LOWER EXTREMITY Common Femoral Vein: No evidence of thrombus. Normal compressibility,  respiratory phasicity and response to augmentation. Saphenofemoral Junction: No evidence of thrombus. Normal compressibility and flow on color Doppler imaging. Profunda Femoral Vein: No evidence of thrombus. Normal compressibility and flow on color Doppler imaging. Femoral Vein: No evidence of thrombus. Normal compressibility, respiratory phasicity and response to augmentation. Popliteal Vein: No evidence of thrombus. Normal compressibility, respiratory phasicity and response to augmentation. Calf Veins: No evidence of thrombus. Normal compressibility and flow on color Doppler imaging. Superficial Great Saphenous Vein: No evidence of thrombus. Normal compressibility. Venous Reflux:  None. Other Findings:  None. LEFT LOWER EXTREMITY Common Femoral Vein: No evidence of thrombus. Normal compressibility, respiratory phasicity and response to augmentation. Saphenofemoral Junction: No evidence of thrombus. Normal compressibility and flow on color Doppler imaging. Profunda Femoral Vein: No evidence of thrombus. Normal compressibility and flow on color Doppler imaging. Femoral Vein: Noncompressibility of the femoral vein in the distal thigh. The lumen is expanded and filled with low-level internal echoes. No evidence of color flow on color Doppler imaging. Findings suggest acute occlusive thrombus. Popliteal Vein: Acute occlusive thrombus extends through the popliteal vein. Calf Veins: Acute occlusive thrombus extends into the peroneal veins. Superficial Great Saphenous Vein: No evidence of thrombus. Normal compressibility. Venous Reflux:  None. Other Findings:  None. IMPRESSION: 1. Positive for acute appearing and occlusive thrombus within the femoral vein in the left distal thigh, the left popliteal vein and the left peroneal veins. 2. No evidence of DVT in the right lower extremity. Electronically Signed   By: Wilkie Lent M.D.   On: 07/31/2023 09:42   ECHOCARDIOGRAM COMPLETE Result Date: 07/30/2023     ECHOCARDIOGRAM REPORT   Patient Name:   Jay Flores Date of Exam: 07/30/2023 Medical Rec #:  [id number]         Height:       68.0 in Accession #:    [id number]        Weight:       111.3 lb Date of Birth:  05-Apr-1960          BSA:          1.593 m Patient Age:    63 years          BP:           105/61 mmHg Patient Gender: M  HR:           75 bpm. Exam Location:  [facility] Procedure: 2D Echo, Cardiac Doppler and Color Doppler (Both Spectral and Color            Flow Doppler were utilized during procedure). Indications:     NSTEMI I21.4                  Chest pain R07.9  History:         Patient has no prior history of Echocardiogram examinations.                  COPD; Risk Factors:Hypertension. Tobacco use.  Sonographer:     Christopher Furnace Referring Phys:  [id number] AMY N COX Diagnosing Phys: Lonni End MD  Sonographer Comments: Technically challenging study due to limited acoustic windows and no apical window. Image acquisition challenging due to COPD and Image acquisition challenging due to patient body habitus. IMPRESSIONS  1. Left ventricular ejection fraction, by estimation, is 60 to 65%. The left ventricle has normal function. Left ventricular endocardial border not optimally defined to evaluate regional wall motion. Left ventricular diastolic function could not be evaluated.  2. Right ventricular systolic function is moderately reduced. The right ventricular size is normal. There is normal pulmonary artery systolic pressure.  3. The mitral valve is normal in structure. Trivial mitral valve regurgitation.  4. The aortic valve is tricuspid. There is moderate thickening of the aortic valve. Aortic valve regurgitation is mild. Aortic valve gradient not assessed due to absent apical windows.  5. The inferior vena cava is normal in size with greater than 50% respiratory variability, suggesting right atrial pressure of 3 mmHg. FINDINGS  Left Ventricle: Left ventricular ejection fraction, by estimation,  is 60 to 65%. The left ventricle has normal function. Left ventricular endocardial border not optimally defined to evaluate regional wall motion. The left ventricular internal cavity size was normal in size. There is no left ventricular hypertrophy. Left ventricular diastolic function could not be evaluated. Right Ventricle: The right ventricular size is normal. No increase in right ventricular wall thickness. Right ventricular systolic function is moderately reduced. There is normal pulmonary artery systolic pressure. The tricuspid regurgitant velocity is 2.10 m/s, and with an assumed right atrial pressure of 3 mmHg, the estimated right ventricular systolic pressure is 20.7 mmHg. Left Atrium: Left atrial size was not well visualized. Right Atrium: Right atrial size was not well visualized. Pericardium: There is no evidence of pericardial effusion. Mitral Valve: The mitral valve is normal in structure. Trivial mitral valve regurgitation. Tricuspid Valve: The tricuspid valve is normal in structure. Tricuspid valve regurgitation is mild. Aortic Valve: The aortic valve is tricuspid. There is moderate thickening of the aortic valve. Aortic valve regurgitation is mild. Aortic valve gradient not assessed due to absent apical windows. Pulmonic Valve: The pulmonic valve was thickened with good excursion. Pulmonic valve regurgitation is trivial. No evidence of pulmonic stenosis. Aorta: The aortic root is normal in size and structure. Venous: The inferior vena cava is normal in size with greater than 50% respiratory variability, suggesting right atrial pressure of 3 mmHg. IAS/Shunts: No atrial level shunt detected by color flow Doppler.  LEFT VENTRICLE PLAX 2D LVIDd:         3.30 cm LVIDs:         2.40 cm LV PW:         0.80 cm LV IVS:        1.00 cm LVOT diam:  2.10 cm LVOT Area:     3.46 cm  LEFT ATRIUM         Index LA diam:    2.40 cm 1.51 cm/m   AORTA Ao Root diam: 3.40 cm TRICUSPID VALVE TR Peak grad:   17.7 mmHg  TR Vmax:        210.15 cm/s  SHUNTS Systemic Diam: 2.10 cm Lonni Hanson MD Electronically signed by Lonni Hanson MD Signature Date/Time: 07/30/2023/1:24:06 PM    Final    NM Pulmonary Perfusion Result Date: 07/29/2023 CLINICAL DATA:  Short of breath.  Concern for pulmonary embolism EXAM: NUCLEAR MEDICINE PERFUSION LUNG SCAN TECHNIQUE: Perfusion images were obtained in multiple projections after intravenous injection of radiopharmaceutical. RADIOPHARMACEUTICALS:  4.4 mCi Tc-6m MAA COMPARISON:  Chest radiograph 07/29/2018 FINDINGS: Wedge-shaped peripheral perfusion defect within the lateral RIGHT lower lobe and RIGHT middle lobe. No perfusion defects in LEFT lung. Subtle opacity in the RIGHT lung base. Perfusion defect greater than the opacity volume. Potential pulmonary infarction radiograph. IMPRESSION: Wedge-shaped peripheral perfusion defects not matched on radiograph. Findings concerning for acute pulmonary embolism the RIGHT lower lobe. Electronically Signed   By: Jackquline Boxer M.D.   On: 07/29/2023 14:14   DG Chest 2 View Result Date: 07/29/2023 CLINICAL DATA:  Chest pain EXAM: CHEST - 2 VIEW COMPARISON:  X-ray 07/06/2023. FINDINGS: No edema, pneumothorax or effusion. Normal cardiopericardial silhouette. Subtle opacity however the right lung base. Overlapping cardiac leads. IMPRESSION: Subtle opacity at the right lung base, new from previous. Please correlate for a subtle infiltrate. Short follow-up versus CT as clinically appropriate. Electronically Signed   By: Ranell Bring M.D.   On: 07/29/2023 12:33   DG Chest 2 View Result Date: 07/06/2023 CLINICAL DATA:  Chest pain.  History of prostate cancer and AIDS. EXAM: CHEST - 2 VIEW COMPARISON:  04/24/2023 FINDINGS: Normal sized heart. Mild patchy opacity at the posterior lung bases on the lateral view and mild patchy opacity in the right lower lobe. Mild-to-moderate central peribronchial thickening. Unremarkable bones. IMPRESSION: 1. Mild  bibasilar and right lower lobe patchy atelectasis or pneumonia. 2. Mild-to-moderate central bronchitic changes. Electronically Signed   By: Elspeth Bathe M.D.   On: 07/06/2023 11:09   CT ABDOMEN PELVIS WO CONTRAST Result Date: 07/06/2023 CLINICAL DATA:  63 year old male with acute abdominal pain. EXAM: CT ABDOMEN AND PELVIS WITHOUT CONTRAST TECHNIQUE: Multidetector CT imaging of the abdomen and pelvis was performed following the standard protocol without IV contrast. RADIATION DOSE REDUCTION: This exam was performed according to the departmental dose-optimization program which includes automated exposure control, adjustment of the mA and/or kV according to patient size and/or use of iterative reconstruction technique. COMPARISON:  CT Abdomen 12/21/2008. FINDINGS: Lower chest: Elevation of the left hemidiaphragm is new since 2010, and from portable chest x-ray this year 04/24/2023. No cardiomegaly. No pericardial effusion. Confluent right lower lobe, costophrenic angle pulmonary ground-glass opacity. No other significant lung base opacity. Hepatobiliary: Cholelithiasis within a contracted gallbladder. No pericholecystic inflammation. Negative noncontrast liver. No bile duct enlargement is evident. Pancreas: Highly abnormal. Subtotal pancreatic atrophy superimposed on extensive parenchymal dystrophic calcifications and dilated main pancreatic duct. These findings terminates at the duodenum C-loop where a rounded cystic mass is present on series 2, image 28 and further described below. No peripancreatic inflammation. Spleen: The entire superior pole of the spleen is not included but the visible noncontrast spleen appears negative. Adrenals/Urinary Tract: Normal adrenal glands. Nonobstructed kidneys. Diminutive ureters. Abnormal urinary bladder. A Foley catheter is within the bladder lumen. The  base of the bladder is decompressed but there is severe abnormal anterior bladder wall thickening and a spiculated appearance  which is indeterminate for inflammation (series 2, image 66). Abnormal thickening there of slightly over 3 cm as seen on coronal image 30. The dome of the bladder contains gas and is non thickened. See sagittal image 84. Pelvic phleboliths. Stomach/Bowel: Redundant but decompressed large bowel in the pelvis. Mild descending colon retained stool. Moderate transverse colon gaseous distension more so than retained stool. Decompressed flexures. Retained stool in the right colon without abnormal distention. Normal gas containing appendix on coronal image 44. Nondilated small bowel. Stomach is also mostly decompressed. However, there is a rounded 3.4 cm cystic mass with simple fluid density at the gastroduodenal junction, adjacent to abnormal pancreas (series 2, image 28 and coronal image 34), new since 2010. No regional inflammation. However, this may be a portion of the larger cystic mass which has an hourglass configuration (coronal image 33 uncertain). Duodenum is nondilated. No pneumoperitoneum. No free fluid. No mesenteric inflammation identified. Vascular/Lymphatic: Aortoiliac calcified atherosclerosis. Normal caliber abdominal aorta. Vascular patency is not evaluated in the absence of IV contrast. No lymphadenopathy identified. Reproductive: Urethral catheter in place. Other: No pelvis free fluid. Musculoskeletal: No acute or suspicious osseous lesion. IMPRESSION: ABDOMEN: 1. Extensive pancreatic atrophy and dystrophic calcification with dilated main pancreatic duct terminating at a Cystic Mass at the duodenal C-loop (3.4 cm unilocular versus larger hour glass shaped, see coronal images 33 and 34), which is new since 2010. No regional inflammation or lymphadenopathy. Differential consideration includes obstructing cystic neoplasm, pseudocyst, choledochal cyst, enteric cyst. 2. Gallstones within contracted gallbladder. No CT evidence of acute cholecystitis or hepatic bile duct obstruction. PELVIS: 1. Bulky tumor  versus inflammatory wall thickening at the anterior base of the urinary bladder, 3.2 cm in thickness. Foley catheter within the bladder. No regional lymphadenopathy. Kidneys and ureters appear nonobstructed. CHEST: 1. Right lower lobe pulmonary ground-glass opacity more suspicious for infection than atelectasis. No pleural fluid. 2. Elevation of the left hemidiaphragm is new since 2010 and from portable chest x-ray this year. 3.  Aortic Atherosclerosis (ICD10-I70.0). Electronically Signed   By: VEAR Hurst M.D.   On: 07/06/2023 10:34   (Echo, Carotid, EGD, Colonoscopy, ERCP)    Subjective: Pt c/o fatigue   Discharge Exam: Vitals:   08/03/23 0402 08/03/23 0738  BP: 139/73 123/70  Pulse: 73 76  Resp: 20 18  Temp: 97.6 F (36.4 C) 98 F (36.7 C)  SpO2: 97% 100%   Vitals:   08/02/23 1616 08/02/23 1923 08/03/23 0402 08/03/23 0738  BP: 109/62 124/69 139/73 123/70  Pulse: 66 67 73 76  Resp: 16 16 20 18   Temp: 98 F (36.7 C) 98.1 F (36.7 C) 97.6 F (36.4 C) 98 F (36.7 C)  TempSrc: Oral Oral Oral Oral  SpO2: 100% 100% 97% 100%  Weight:      Height:        General: Pt is alert, awake, not in acute distress Cardiovascular: S1/S2 +, no rubs, no gallops Respiratory: CTA bilaterally, no wheezing, no rhonchi Abdominal: Soft, NT, ND, bowel sounds + Extremities: no edema, no cyanosis    The results of significant diagnostics from this hospitalization (including imaging, microbiology, ancillary and laboratory) are listed below for reference.     Microbiology: No results found for this or any previous visit (from the past 240 hours).   Labs: BNP (last 3 results) No results for input(s): BNP in the last 8760 hours. Basic Metabolic  Panel: Recent Labs  Lab 07/30/23 0518 07/31/23 0503 08/01/23 0414 08/02/23 0345 08/03/23 0429  NA 142 142 140 139 136  K 3.8 3.7 3.9 4.3 4.9  CL 110 111 111 108 106  CO2 28 23 22 24  21*  GLUCOSE 140* 166* 139* 141* 150*  BUN 35* 34* 39* 42* 39*   CREATININE 2.71* 2.56* 2.46* 2.30* 2.04*  CALCIUM  8.9 8.5* 8.8* 9.0 9.4  MG  --  2.0 1.9 1.9 2.1   Liver Function Tests: No results for input(s): AST, ALT, ALKPHOS, BILITOT, PROT, ALBUMIN  in the last 168 hours. No results for input(s): LIPASE, AMYLASE in the last 168 hours. No results for input(s): AMMONIA in the last 168 hours. CBC: Recent Labs  Lab 07/30/23 0518 07/31/23 0503 08/01/23 0414 08/02/23 0345 08/03/23 0429  WBC 7.3 7.2 7.3 7.4 7.6  HGB 7.9* 7.3* 7.7* 7.2* 7.5*  HCT 26.1* 23.8* 24.8* 23.1* 24.6*  MCV 92.6 91.5 91.5 90.2 90.1  PLT 122* 121* 126* 128* 152   Cardiac Enzymes: No results for input(s): CKTOTAL, CKMB, CKMBINDEX, TROPONINI in the last 168 hours. BNP: Invalid input(s): POCBNP CBG: Recent Labs  Lab 08/02/23 1134 08/02/23 1607 08/02/23 2052 08/03/23 0737 08/03/23 1200  GLUCAP 149* 141* 141* 152* 165*   D-Dimer No results for input(s): DDIMER in the last 72 hours. Hgb A1c No results for input(s): HGBA1C in the last 72 hours. Lipid Profile No results for input(s): CHOL, HDL, LDLCALC, TRIG, CHOLHDL, LDLDIRECT in the last 72 hours. Thyroid function studies No results for input(s): TSH, T4TOTAL, T3FREE, THYROIDAB in the last 72 hours.  Invalid input(s): FREET3 Anemia work up No results for input(s): VITAMINB12, FOLATE, FERRITIN, TIBC, IRON, RETICCTPCT in the last 72 hours. Urinalysis    Component Value Date/Time   COLORURINE YELLOW (A) 07/06/2023 0948   APPEARANCEUR TURBID (A) 07/06/2023 0948   APPEARANCEUR Cloudy (A) 02/11/2023 1123   LABSPEC 1.016 07/06/2023 0948   PHURINE 5.0 07/06/2023 0948   GLUCOSEU >=500 (A) 07/06/2023 0948   HGBUR MODERATE (A) 07/06/2023 0948   BILIRUBINUR NEGATIVE 07/06/2023 0948   BILIRUBINUR Negative 02/11/2023 1123   KETONESUR NEGATIVE 07/06/2023 0948   PROTEINUR 100 (A) 07/06/2023 0948   UROBILINOGEN 1.0 01/23/2012 0602   NITRITE POSITIVE (A)  07/06/2023 0948   LEUKOCYTESUR LARGE (A) 07/06/2023 0948   Sepsis Labs Recent Labs  Lab 07/31/23 0503 08/01/23 0414 08/02/23 0345 08/03/23 0429  WBC 7.2 7.3 7.4 7.6   Microbiology No results found for this or any previous visit (from the past 240 hours).   Time coordinating discharge: 34 minutes  SIGNED:   Anthony CHRISTELLA Pouch, MD  [facility] 08/03/2023, 12:34 PM Pager   If 7PM-7AM, please contact night-coverage [email or web address]

## 2023-08-03 NOTE — [place] FL2 (Signed)
 [place]  MEDICAID [id number] LEVEL OF CARE FORM     IDENTIFICATION  Patient Name: Jay Flores Birthdate: 09-Jun-1960 Sex: male Admission Date (Current Location): 07/29/2023  [facility] and [place] Number:  Chiropodist and Address:  [facility], [address] [place], [place] [address]      Provider Number: [id number]  Attending Physician Name and Address:  Trudy Anthony HERO, MD  Relative Name and Phone Number:  [name] ewing [phone] sister    Current Level of Care: Hospital Recommended Level of Care: [facility] Prior Approval Number:    Date Approved/Denied:   PASRR Number:    Discharge Plan: Other (Comment)    Current Diagnoses: Patient Active Problem List   Diagnosis Date Noted   Protein-calorie malnutrition, severe 08/01/2023   NSTEMI (non-ST elevated myocardial infarction) (HCC) 07/29/2023   Diabetes mellitus type 2, noninsulin dependent (HCC) 07/29/2023   CKD (chronic kidney disease) stage 4, GFR 15-29 ml/min (HCC) 07/29/2023   Leukocytosis 07/29/2023   Right pulmonary infiltrate on CXR 07/29/2023   Community acquired pneumonia 07/06/2023   Chest discomfort 07/06/2023   Depression 07/06/2023   Symptomatic anemia 04/24/2023   HLD (hyperlipidemia) 04/24/2023   Syncope 04/24/2023   Acute renal failure superimposed on stage 3b chronic kidney disease (HCC) 04/24/2023   Type II diabetes mellitus with renal manifestations (HCC) 04/24/2023   Prostate cancer (HCC) 04/24/2023   Elevated lactic acid level 04/24/2023   Pancreatic cyst 03/23/2022   Thrombocytopenia (HCC) 03/23/2022   Callus 11/23/2021   Pain due to onychomycosis of toenails of both feet 06/15/2021   Diabetic neuropathy (HCC) 06/15/2021   Bladder dysfunction 05/07/2021   H/O urinary retention 05/07/2021   Tobacco use 04/17/2018   DKA (diabetic ketoacidosis) (HCC) 03/29/2017   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   Leg weakness, bilateral 01/18/2016   Difficulty  walking 12/19/2015   Hyperreflexia of lower extremity 12/19/2015   Muscle spasm of both lower legs 12/19/2015   HTN (hypertension) 02/19/2012   Weakness 02/19/2012   Metabolic acidosis 01/27/2012   Hypokalemia 01/23/2012   PNA (pneumonia) 01/22/2012   HIV (human immunodeficiency virus infection) (HCC) 01/22/2012   Brain lesion 01/22/2012   Acute renal failure (HCC) 01/22/2012   Anemia 01/22/2012   Altered mental status 01/22/2012   Hepatitis C infection 10/05/2011    Orientation RESPIRATION BLADDER Height & Weight     Self  Normal Indwelling catheter Weight: 50.5 kg Height:  5' 8 (172.7 cm)  BEHAVIORAL SYMPTOMS/MOOD NEUROLOGICAL BOWEL NUTRITION STATUS        Diet  AMBULATORY STATUS COMMUNICATION OF NEEDS Skin   Limited Assist Verbally                         Personal Care Assistance Level of Assistance  Bathing, Feeding, Dressing Bathing Assistance: Limited assistance Feeding assistance: Limited assistance Dressing Assistance: Limited assistance     Functional Limitations Info  Sight, Hearing Sight Info: Adequate Hearing Info: Adequate      SPECIAL CARE FACTORS FREQUENCY                       Contractures      Additional Factors Info  Code Status, Allergies Code Status Info: Full Code Allergies Info: No Known           Current Medications (08/03/2023):  This is the current hospital active medication list Current Facility-Administered Medications  Medication Dose Route Frequency Provider Last Rate Last Admin  acetaminophen  (TYLENOL ) tablet 650 mg  650 mg Oral Q6H PRN Cox, Amy N, DO       Or   acetaminophen  (TYLENOL ) suppository 650 mg  650 mg Rectal Q6H PRN Cox, Amy N, DO       apixaban  (ELIQUIS ) tablet 10 mg  10 mg Oral BID Chappell, Alex B, RPH   10 mg at 08/03/23 9061   Followed by   NOREEN ON 08/06/2023] apixaban  (ELIQUIS ) tablet 5 mg  5 mg Oral BID Chappell, Alex B, RPH       atorvastatin  (LIPITOR) tablet 40 mg  40 mg Oral QHS Cox, Amy N,  DO   40 mg at 08/02/23 2219   Chlorhexidine  Gluconate Cloth 2 % PADS 6 each  6 each Topical Daily Awanda [place], MD   6 each at 08/03/23 9060   cholecalciferol  (VITAMIN D3) 25 MCG (1000 UNIT) tablet 1,000 Units  1,000 Units Oral QHS Cox, Amy N, DO   1,000 Units at 08/02/23 2220   dolutegravir -rilpivirine  (JULUCA ) 50-25 MG per tablet 1 tablet  1 tablet Oral Daily Clair Marolyn NOVAK, RPH   1 tablet at 08/03/23 9060   feeding supplement (GLUCERNA SHAKE) (GLUCERNA SHAKE) liquid 237 mL  237 mL Oral TID BM Trudy Anthony HERO, MD   237 mL at 08/03/23 9060   finasteride  (PROSCAR ) tablet 5 mg  5 mg Oral Daily Cox, Amy N, DO   5 mg at 08/03/23 9060   folic acid  (FOLVITE ) tablet 1 mg  1 mg Oral Daily Cox, Amy N, DO   1 mg at 08/03/23 9060   insulin  aspart (novoLOG ) injection 0-5 Units  0-5 Units Subcutaneous QHS Cox, Amy N, DO       insulin  aspart (novoLOG ) injection 0-9 Units  0-9 Units Subcutaneous TID WC Cox, Amy N, DO   2 Units at 08/03/23 1235   loperamide  (IMODIUM ) capsule 2 mg  2 mg Oral QID PRN Cox, Amy N, DO       metoprolol  succinate (TOPROL -XL) 24 hr tablet 100 mg  100 mg Oral Daily Cox, Amy N, DO   100 mg at 08/03/23 9061   multivitamin with minerals tablet 1 tablet  1 tablet Oral Daily Greenwood, Howard F, RPH   1 tablet at 08/03/23 9060   nicotine  (NICODERM CQ  - dosed in mg/24 hours) patch 21 mg  21 mg Transdermal Daily PRN Cox, Amy N, DO       ondansetron  (ZOFRAN ) tablet 4 mg  4 mg Oral Q6H PRN Cox, Amy N, DO       Or   ondansetron  (ZOFRAN ) injection 4 mg  4 mg Intravenous Q6H PRN Cox, Amy N, DO       sodium bicarbonate  tablet 650 mg  650 mg Oral BID Cox, Amy N, DO   650 mg at 08/03/23 9061   Vilazodone  HCl TABS 20 mg  20 mg Oral Daily Cox, Amy N, DO   20 mg at 08/03/23 9060     Discharge Medications: Please see discharge summary for a list of discharge medications.  Relevant Imaging Results:  Relevant Lab Results:   Additional Information    Delphine KANDICE Bring, RN

## 2023-08-03 NOTE — Plan of Care (Signed)

## 2023-08-08 ENCOUNTER — Telehealth: Payer: Self-pay | Admitting: Urology

## 2023-08-08 ENCOUNTER — Telehealth: Payer: Self-pay | Admitting: Internal Medicine

## 2023-08-08 NOTE — Telephone Encounter (Signed)
 Called back and reviewed provider recommendations and she verbalized understanding with no further needs at this time.    Lorene Lesley CROME, PA-C     08/08/23  3:13 PM It appears that he is taking Eliquis  for PE. We did not prescribe this and therefore I cannot make any recommendations regarding the medication. I would recommend his care team reach out to the patient's PCP for further advice.

## 2023-08-08 NOTE — Telephone Encounter (Signed)
 Brandi with [facility] called reporting that patient has some blood in his urine but that this is not new he has had it in the past and he was not on Eliquis  at that time. She just wanted to make sure that it was ok for him to continue the eliquis . Instructed that he remain on Eliquis  and that I would check with provider. He is not yet seen anyone in our office but we prescribed this medication in the hospital. She also reports that they did contact Urology as well. He does have upcoming appointment here in our office.

## 2023-08-08 NOTE — Telephone Encounter (Signed)
 New Message:     Please call Jay Flores. She needs to discuss patient's medications.

## 2023-08-08 NOTE — Telephone Encounter (Signed)
 [name] from [facility], where patient currently resides, called stating patient has had off and on issues with blood in urine. Patient was currently placed on Eliquis  and she would like to know if patient should continue taking this medication. Please advise. She may be reached at [phone].

## 2023-08-11 ENCOUNTER — Other Ambulatory Visit: Payer: Self-pay

## 2023-08-11 ENCOUNTER — Encounter: Payer: Self-pay | Admitting: Emergency Medicine

## 2023-08-11 ENCOUNTER — Emergency Department
Admission: EM | Admit: 2023-08-11 | Discharge: 2023-08-11 | Disposition: A | Discharge status: Home / Self Care | Attending: Emergency Medicine | Admitting: Emergency Medicine

## 2023-08-11 DIAGNOSIS — N390 Urinary tract infection, site not specified: Secondary | ICD-10-CM | POA: Diagnosis not present

## 2023-08-11 DIAGNOSIS — R319 Hematuria, unspecified: Secondary | ICD-10-CM | POA: Diagnosis present

## 2023-08-11 LAB — CBC WITH DIFFERENTIAL/PLATELET
Abs Immature Granulocytes: 0.03 K/uL (ref 0.00–0.07)
Basophils Absolute: 0 K/uL (ref 0.0–0.1)
Basophils Relative: 0 %
Eosinophils Absolute: 0.1 K/uL (ref 0.0–0.5)
Eosinophils Relative: 1 %
HCT: 24 % — ABNORMAL LOW (ref 39.0–52.0)
Hemoglobin: 7.4 g/dL — ABNORMAL LOW (ref 13.0–17.0)
Immature Granulocytes: 0 %
Lymphocytes Relative: 10 %
Lymphs Abs: 0.9 K/uL (ref 0.7–4.0)
MCH: 28 pg (ref 26.0–34.0)
MCHC: 30.8 g/dL (ref 30.0–36.0)
MCV: 90.9 fL (ref 80.0–100.0)
Monocytes Absolute: 0.3 K/uL (ref 0.1–1.0)
Monocytes Relative: 4 %
Neutro Abs: 7.5 K/uL (ref 1.7–7.7)
Neutrophils Relative %: 85 %
Platelets: 225 K/uL (ref 150–400)
RBC: 2.64 MIL/uL — ABNORMAL LOW (ref 4.22–5.81)
RDW: 14.4 % (ref 11.5–15.5)
WBC: 8.9 K/uL (ref 4.0–10.5)
nRBC: 0 % (ref 0.0–0.2)

## 2023-08-11 LAB — COMPREHENSIVE METABOLIC PANEL WITH GFR
ALT: 9 U/L (ref 0–44)
AST: 20 U/L (ref 15–41)
Albumin: 2.5 g/dL — ABNORMAL LOW (ref 3.5–5.0)
Alkaline Phosphatase: 66 U/L (ref 38–126)
Anion gap: 10 (ref 5–15)
BUN: 28 mg/dL — ABNORMAL HIGH (ref 8–23)
CO2: 21 mmol/L — ABNORMAL LOW (ref 22–32)
Calcium: 8.9 mg/dL (ref 8.9–10.3)
Chloride: 105 mmol/L (ref 98–111)
Creatinine, Ser: 2.77 mg/dL — ABNORMAL HIGH (ref 0.61–1.24)
GFR, Estimated: 25 mL/min — ABNORMAL LOW (ref >60)
Glucose, Bld: 166 mg/dL — ABNORMAL HIGH (ref 70–99)
Potassium: 4.1 mmol/L (ref 3.5–5.1)
Sodium: 136 mmol/L (ref 135–145)
Total Bilirubin: 0.6 mg/dL (ref 0.0–1.2)
Total Protein: 7.2 g/dL (ref 6.5–8.1)

## 2023-08-11 LAB — URINALYSIS, ROUTINE W REFLEX MICROSCOPIC
RBC / HPF: >50 RBC/hpf (ref 0–5)
Squamous Epithelial / HPF: 0 /HPF (ref 0–5)
WBC, UA: >50 WBC/hpf (ref 0–5)

## 2023-08-11 LAB — LIPASE, BLOOD: Lipase: 22 U/L (ref 11–51)

## 2023-08-11 LAB — LACTIC ACID, PLASMA: Lactic Acid, Venous: 1.5 mmol/L (ref 0.5–1.9)

## 2023-08-11 MED ORDER — CEPHALEXIN 500 MG PO CAPS: 500.0000 mg | ORAL_CAPSULE | Freq: Three times a day (TID) | ORAL | 0 refills | Status: DC

## 2023-08-11 MED ORDER — SODIUM CHLORIDE 0.9 % IV SOLN
1.0000 g | Freq: Once | INTRAVENOUS | Status: AC
  Administered 2023-08-11: 1 g via INTRAVENOUS
  Filled 2023-08-11: qty 10

## 2023-08-11 NOTE — ED Triage Notes (Signed)
 Pt via [facility] from [facility]. Pt c/o lower abd pain and groin pain. States he has been having blood in his catheter. Unknown how long. Recently placed on Eliquis . Pt has a hx of prostate cancer and just recently completed radiation. Pt is A&Ox4 and NAD  EMS reports initial HR was in the 130s, reports approx of fluid given. HR now 101. CBG 200. 150/60 BP. 97.9 temp orally.

## 2023-08-11 NOTE — ED Provider Notes (Signed)
 [facility] Provider Note    Event Date/Time   First MD Initiated Contact with Patient 08/11/23 [phone]     (approximate)   History   Abdominal Pain and Hematuria   HPI  Jay Flores is a 63 y.o. male who presents to the emergency department today because of concerns for lower abdominal pain.  States that it started this morning.  Did not have any pain yesterday.  He describes it as sharp.  It does not radiate.  The pain is not associated with any nausea or vomiting.  He also has noticed blood in his catheter.  He is unsure how long it has been present.  Denies any fevers.     Physical Exam   Triage Vital Signs: ED Triage Vitals  Encounter Vitals Group     BP 08/11/23 0847 (!) 158/81     Girls Systolic BP Percentile --      Girls Diastolic BP Percentile --      Boys Systolic BP Percentile --      Boys Diastolic BP Percentile --      Pulse Rate 08/11/23 0845 (!) 101     Resp 08/11/23 0845 (!) 23     Temp 08/11/23 0848 98.3 F (36.8 C)     Temp Source 08/11/23 0848 Oral     SpO2 08/11/23 0845 100 %     Weight 08/11/23 0847 114 lb 4.8 oz (51.8 kg)     Height 08/11/23 0847 5' 8 (1.727 m)     Head Circumference --      Peak Flow --      Pain Score 08/11/23 0847 10     Pain Loc --      Pain Education --      Exclude from Growth Chart --     Most recent vital signs: Vitals:   08/11/23 0847 08/11/23 0848  BP: (!) 158/81   Pulse:    Resp:    Temp:  98.3 F (36.8 C)  SpO2:     General: Awake, alert, not completely oriented. CV:  Good peripheral perfusion. Tachycardia. Resp:  Normal effort. Lungs clear. Abd:  No distention. Non tender. Other:  Foley bag with dark urine.   ED Results / Procedures / Treatments   Labs (all labs ordered are listed, but only abnormal results are displayed) Labs Reviewed  URINALYSIS, ROUTINE W REFLEX MICROSCOPIC - Abnormal; Notable for the following components:      Result Value   Color, Urine PINK (*)     APPearance CLOUDY (*)    Glucose, UA   (*)    Value: TEST NOT REPORTED DUE TO COLOR INTERFERENCE OF URINE PIGMENT   Hgb urine dipstick   (*)    Value: TEST NOT REPORTED DUE TO COLOR INTERFERENCE OF URINE PIGMENT   Bilirubin Urine   (*)    Value: TEST NOT REPORTED DUE TO COLOR INTERFERENCE OF URINE PIGMENT   Ketones, ur   (*)    Value: TEST NOT REPORTED DUE TO COLOR INTERFERENCE OF URINE PIGMENT   Protein, ur   (*)    Value: TEST NOT REPORTED DUE TO COLOR INTERFERENCE OF URINE PIGMENT   Nitrite   (*)    Value: TEST NOT REPORTED DUE TO COLOR INTERFERENCE OF URINE PIGMENT   Leukocytes,Ua   (*)    Value: TEST NOT REPORTED DUE TO COLOR INTERFERENCE OF URINE PIGMENT   Bacteria, UA MANY (*)    All other components within normal  limits  CBC WITH DIFFERENTIAL/PLATELET - Abnormal; Notable for the following components:   RBC 2.64 (*)    Hemoglobin 7.4 (*)    HCT 24.0 (*)    All other components within normal limits  COMPREHENSIVE METABOLIC PANEL WITH GFR - Abnormal; Notable for the following components:   CO2 21 (*)    Glucose, Bld 166 (*)    BUN 28 (*)    Creatinine, Ser 2.77 (*)    Albumin  2.5 (*)    GFR, Estimated 25 (*)    All other components within normal limits  URINE CULTURE  LACTIC ACID, PLASMA  LIPASE, BLOOD     EKG  I, Guadalupe Eagles, attending physician, personally viewed and interpreted this EKG  EKG Time: 0843 Rate: 100 Rhythm: sinus tachycardia Axis: normal Intervals: qtc 453 QRS: narrow, LVH ST changes: no st elevation Impression: abnormal ekg   RADIOLOGY None   PROCEDURES:  Critical Care performed: No   MEDICATIONS ORDERED IN ED: Medications - No data to display   IMPRESSION / MDM / ASSESSMENT AND PLAN / ED COURSE  I reviewed the triage vital signs and the nursing notes.                              Differential diagnosis includes, but is not limited to, urinary tract infection, neoplasm, diverticulitis  Patient's presentation is most  consistent with acute presentation with potential threat to life or bodily function.   Patient presented to the emergency department today because of lower abdominal pain and hematuria.  On exam patient without any abdominal tenderness.  Foley catheter was replaced and sample from the Foley is concerning for infection.  I do think this could explain the patient's symptoms and hematuria.  Will give patient dose of IV antibiotics here.  No concerning leukocytosis in the blood work.  This could be reasonable for patient be treated with oral antibiotics. Discussed finding and plan with patient.     FINAL CLINICAL IMPRESSION(S) / ED DIAGNOSES   Final diagnoses:  Lower urinary tract infectious disease     Note:  This document was prepared using Dragon voice recognition software and may include unintentional dictation errors.    Eagles Guadalupe, MD 08/11/23 [phone]

## 2023-08-11 NOTE — ED Notes (Signed)
 Called for urology cart at this time.

## 2023-08-15 LAB — URINE CULTURE: Culture: 80000 — AB

## 2023-08-15 NOTE — Progress Notes (Signed)
 ED Antimicrobial Stewardship Positive Culture Follow Up   Jay Flores is an 62 y.o. male who presented to [facility] on 08/11/2023 with a chief complaint of  Chief Complaint  Patient presents with   Abdominal Pain   Hematuria    Recent Results (from the past 720 hours)  Urine Culture     Status: Abnormal   Collection Time: 08/11/23 11:08 AM   Specimen: Urine, Catheterized  Result Value Ref Range Status   Specimen Description   Final    URINE, CATHETERIZED Performed at [facility], [address] [place], [place] [address]    Special Requests   Final    NONE Performed at [facility], [address] [place], [place] [address]    Culture (A)  Final    80,000 COLONIES/mL KLEBSIELLA PNEUMONIAE >=100,000 COLONIES/mL ENTEROCOCCUS FAECALIS    Report Status 08/15/2023 FINAL  Final   Organism ID, Bacteria KLEBSIELLA PNEUMONIAE (A)  Final   Organism ID, Bacteria ENTEROCOCCUS FAECALIS (A)  Final      Susceptibility   Enterococcus faecalis - MIC*    AMPICILLIN <=2 SENSITIVE Sensitive     NITROFURANTOIN <=16 SENSITIVE Sensitive     VANCOMYCIN 1 SENSITIVE Sensitive     * >=100,000 COLONIES/mL ENTEROCOCCUS FAECALIS   Klebsiella pneumoniae - MIC*    AMPICILLIN >=32 RESISTANT Resistant     CEFAZOLIN <=4 SENSITIVE Sensitive     CEFEPIME <=0.12 SENSITIVE Sensitive     CEFTRIAXONE  <=0.25 SENSITIVE Sensitive     CIPROFLOXACIN  <=0.25 SENSITIVE Sensitive     GENTAMICIN  <=1 SENSITIVE Sensitive     IMIPENEM <=0.25 SENSITIVE Sensitive     NITROFURANTOIN 256 RESISTANT Resistant     TRIMETH /SULFA  <=20 SENSITIVE Sensitive     AMPICILLIN/SULBACTAM 8 SENSITIVE Sensitive     PIP/TAZO <=4 SENSITIVE Sensitive ug/mL    * 80,000 COLONIES/mL KLEBSIELLA PNEUMONIAE    [x]  Treated with cephalexin , organism resistant to prescribed antimicrobial  New antibiotic prescription: amoxicillin  500 mg po TID x 7 days called to [facility] in [place], [place] via [place]. I also  spoke personally to Brand (assigned as Case Administrator) who understands the plan. Additionally, hs has a urology appointment scheduled for 08/20/23 for follow-up  ED Provider: GORMAN Punter, MD   Adriana JONETTA Bolster 08/15/2023, 1:02 PM Clinical Pharmacist Monday - Friday phone -  [phone] - [phone]

## 2023-08-16 ENCOUNTER — Telehealth: Payer: Self-pay

## 2023-08-16 ENCOUNTER — Other Ambulatory Visit: Payer: Self-pay

## 2023-08-16 ENCOUNTER — Emergency Department

## 2023-08-16 ENCOUNTER — Emergency Department
Admission: EM | Admit: 2023-08-16 | Discharge: 2023-08-16 | Disposition: A | Discharge status: Home / Self Care | Attending: Emergency Medicine | Admitting: Emergency Medicine

## 2023-08-16 DIAGNOSIS — N3001 Acute cystitis with hematuria: Secondary | ICD-10-CM | POA: Insufficient documentation

## 2023-08-16 DIAGNOSIS — N3289 Other specified disorders of bladder: Secondary | ICD-10-CM | POA: Diagnosis not present

## 2023-08-16 DIAGNOSIS — Z992 Dependence on renal dialysis: Secondary | ICD-10-CM | POA: Diagnosis not present

## 2023-08-16 DIAGNOSIS — N186 End stage renal disease: Secondary | ICD-10-CM | POA: Insufficient documentation

## 2023-08-16 DIAGNOSIS — I120 Hypertensive chronic kidney disease with stage 5 chronic kidney disease or end stage renal disease: Secondary | ICD-10-CM | POA: Insufficient documentation

## 2023-08-16 DIAGNOSIS — R1030 Lower abdominal pain, unspecified: Secondary | ICD-10-CM | POA: Diagnosis present

## 2023-08-16 DIAGNOSIS — E1122 Type 2 diabetes mellitus with diabetic chronic kidney disease: Secondary | ICD-10-CM | POA: Insufficient documentation

## 2023-08-16 LAB — CBC
HCT: 25.7 % — ABNORMAL LOW (ref 39.0–52.0)
Hemoglobin: 7.6 g/dL — ABNORMAL LOW (ref 13.0–17.0)
MCH: 27 pg (ref 26.0–34.0)
MCHC: 29.6 g/dL — ABNORMAL LOW (ref 30.0–36.0)
MCV: 91.1 fL (ref 80.0–100.0)
Platelets: 253 K/uL (ref 150–400)
RBC: 2.82 MIL/uL — ABNORMAL LOW (ref 4.22–5.81)
RDW: 14.1 % (ref 11.5–15.5)
WBC: 7.1 K/uL (ref 4.0–10.5)
nRBC: 0 % (ref 0.0–0.2)

## 2023-08-16 LAB — URINALYSIS, ROUTINE W REFLEX MICROSCOPIC
Bilirubin Urine: NEGATIVE
Glucose, UA: >=500 mg/dL — AB
Ketones, ur: NEGATIVE mg/dL
Nitrite: NEGATIVE
Protein, ur: 100 mg/dL — AB
RBC / HPF: >50 RBC/hpf (ref 0–5)
Specific Gravity, Urine: 1.021 (ref 1.005–1.030)
Squamous Epithelial / HPF: 0 /HPF (ref 0–5)
WBC, UA: >50 WBC/hpf (ref 0–5)
pH: 5 (ref 5.0–8.0)

## 2023-08-16 LAB — COMPREHENSIVE METABOLIC PANEL WITH GFR
ALT: 8 U/L (ref 0–44)
AST: 12 U/L — ABNORMAL LOW (ref 15–41)
Albumin: 2.6 g/dL — ABNORMAL LOW (ref 3.5–5.0)
Alkaline Phosphatase: 65 U/L (ref 38–126)
Anion gap: 10 (ref 5–15)
BUN: 23 mg/dL (ref 8–23)
CO2: 24 mmol/L (ref 22–32)
Calcium: 9.1 mg/dL (ref 8.9–10.3)
Chloride: 104 mmol/L (ref 98–111)
Creatinine, Ser: 2.62 mg/dL — ABNORMAL HIGH (ref 0.61–1.24)
GFR, Estimated: 27 mL/min — ABNORMAL LOW (ref >60)
Glucose, Bld: 104 mg/dL — ABNORMAL HIGH (ref 70–99)
Potassium: 3.7 mmol/L (ref 3.5–5.1)
Sodium: 138 mmol/L (ref 135–145)
Total Bilirubin: 0.6 mg/dL (ref 0.0–1.2)
Total Protein: 7 g/dL (ref 6.5–8.1)

## 2023-08-16 LAB — LIPASE, BLOOD: Lipase: 22 U/L (ref 11–51)

## 2023-08-16 MED ORDER — ACETAMINOPHEN 500 MG PO TABS
1000.0000 mg | ORAL_TABLET | Freq: Once | ORAL | Status: AC
  Administered 2023-08-16: 1000 mg via ORAL
  Filled 2023-08-16: qty 2

## 2023-08-16 MED ORDER — LIDOCAINE HCL (PF) 1 % IJ SOLN
INTRAMUSCULAR | Status: AC
  Administered 2023-08-16: 2.1 mL
  Filled 2023-08-16: qty 5

## 2023-08-16 MED ORDER — CEFTRIAXONE SODIUM 1 G IJ SOLR
1.0000 g | Freq: Once | INTRAMUSCULAR | Status: AC
  Administered 2023-08-16: 1 g via INTRAMUSCULAR
  Filled 2023-08-16: qty 10

## 2023-08-16 NOTE — ED Notes (Signed)
 [facility] called for transport to [facility].

## 2023-08-16 NOTE — Telephone Encounter (Signed)
 [name] from [facility] called the triage line asking if pt could be seen today due to possible cath blockage. States the pt has pain in his bladder and feels tight in that area. States he is draining into his cath bag but it's very little. Advise [name] to take Jay Flores to the ER because we do not have any openings today. [name] voiced understanding.

## 2023-08-16 NOTE — ED Notes (Signed)
[facility] arrived to transport patient

## 2023-08-16 NOTE — ED Provider Notes (Signed)
 [facility] Provider Note    Event Date/Time   First MD Initiated Contact with Patient 08/16/23 1457     (approximate)   History   Abdominal Pain   HPI Jay Flores is a 63 y.o. male history of ESRD on dialysis, DM 2, HTN, HLD presenting today for lower abdominal pain.  Patient reports lower abdominal pain that has been going on for several weeks and is constant.  Feels like it is getting worse.  Is not tender to touch.  Denies associated nausea, vomiting, constipation, diarrhea.  Chronic indwelling Foley catheter and unsure if he has any pain when urine is coming out but has noted cloudiness to his urine.  No fevers or chills.  Chart review: Patient seen in the ED 5 days ago for similar symptoms and diagnosed with a UTI.  Started on Keflex .  Unsure if he is taking all that as prescribed.     Physical Exam   Triage Vital Signs: ED Triage Vitals  Encounter Vitals Group     BP 08/16/23 1319 125/73     Girls Systolic BP Percentile --      Girls Diastolic BP Percentile --      Boys Systolic BP Percentile --      Boys Diastolic BP Percentile --      Pulse Rate 08/16/23 1319 76     Resp 08/16/23 1319 18     Temp 08/16/23 1319 97.6 F (36.4 C)     Temp Source 08/16/23 1319 Oral     SpO2 08/16/23 1319 100 %     Weight --      Height --      Head Circumference --      Peak Flow --      Pain Score 08/16/23 1250 10     Pain Loc --      Pain Education --      Exclude from Growth Chart --     Most recent vital signs: Vitals:   08/16/23 1700 08/16/23 1730  BP: 124/72 125/73  Pulse: 64 62  Resp:    Temp:    SpO2: 100% 100%   I have reviewed the vital signs. General:  Awake, alert, no acute distress. Head:  Normocephalic, Atraumatic. EENT:  PERRL, EOMI, Oral mucosa pink and moist, Neck is supple. Cardiovascular: Regular rate, 2+ distal pulses. Respiratory:  Normal respiratory effort, symmetrical expansion, no distress.   Abdomen: Soft,  nontender, nondistended Extremities:  Moving all four extremities through full ROM without pain.   Neuro:  Alert and oriented.  Interacting appropriately.   Skin:  Warm, dry, no rash.   Psych: Appropriate affect.    ED Results / Procedures / Treatments   Labs (all labs ordered are listed, but only abnormal results are displayed) Labs Reviewed  COMPREHENSIVE METABOLIC PANEL WITH GFR - Abnormal; Notable for the following components:      Result Value   Glucose, Bld 104 (*)    Creatinine, Ser 2.62 (*)    Albumin  2.6 (*)    AST 12 (*)    GFR, Estimated 27 (*)    All other components within normal limits  CBC - Abnormal; Notable for the following components:   RBC 2.82 (*)    Hemoglobin 7.6 (*)    HCT 25.7 (*)    MCHC 29.6 (*)    All other components within normal limits  URINALYSIS, ROUTINE W REFLEX MICROSCOPIC - Abnormal; Notable for the following components:   Color,  Urine YELLOW (*)    APPearance TURBID (*)    Glucose, UA >=500 (*)    Hgb urine dipstick MODERATE (*)    Protein, ur 100 (*)    Leukocytes,Ua MODERATE (*)    Bacteria, UA FEW (*)    All other components within normal limits  LIPASE, BLOOD     EKG    RADIOLOGY Independently interpreted CT abdomen/pelvis showing evidence of cystitis.  Radiologist separately read concerns for possible bladder cancer and potential nodules in the lungs   PROCEDURES:  Critical Care performed: No  Procedures   MEDICATIONS ORDERED IN ED: Medications  cefTRIAXone  (ROCEPHIN ) injection 1 g (has no administration in time range)  lidocaine  (PF) (XYLOCAINE ) 1 % injection (has no administration in time range)  acetaminophen  (TYLENOL ) tablet 1,000 mg (1,000 mg Oral Given 08/16/23 1529)     IMPRESSION / MDM / ASSESSMENT AND PLAN / ED COURSE  I reviewed the triage vital signs and the nursing notes.                              Differential diagnosis includes, but is not limited to, acute cystitis, constipation, colitis,  enteritis  Patient's presentation is most consistent with acute complicated illness / injury requiring diagnostic workup.  Patient is a 63 year old male presenting today for lower abdominal pain x 2 weeks.  Nontender on exam.  Vital signs are stable.  His urine looks notably cloudy and bloody.  Otherwise his CBC, CMP, and lipase are largely reassuring.  Patient seen 5 days ago and diagnosed with UTI although he is unsure if he has been taking his medication as prescribed for the antibiotics.  Will get CT imaging to rule out any other acute intra-abdominal pathology.  CT abdomen/pelvis shows evidence of cystitis which is confirmed on UA again here today.  Also potentially representing possible cancer in the bladder with known history of prostate cancer.  Also potential nodules in the lungs representing metastatic spread.  Patient comes from a assisted living facility and we spoke with him on the phone.  They are aware of his UTI and he was sent a second antibiotic today for full coverage but he has not received the first dosage of that with them yet.  He is otherwise stable and I do not see an indication for admission to the hospital at this time given that he will be started on antibiotics that we will treat him at according to his urine culture from 5 days ago.  He does have CT imaging finding of concern of possible spread of his prostate cancer to his bladder as well as possible nodules in his lungs.  I have sent him follow-up with his urology team for ongoing management as well as follow-up with oncology as it looks like he is only seeing radiation oncology outpatient for the prostate cancer.  Separately, I sent a message to oncology through secure chat to ensure he does get adequate follow-up.  Otherwise safer discharge at this time and given strict return precautions.  Clinical Course as of 08/16/23 1802  Fri Aug 16, 2023  1754 Spoke with patient's facility at [facility] care center.  They stated he  was given additional antibiotic today to take on top of the first 1 but they had not started on them yet.  There were concern for possible obstruction in his Foley.  It is flowing well here today.  I did discuss with them making sure  he took those antibiotics and following up with urology.  I have also discussed with him that he will need follow-up with urology as well as oncology for this possible new bladder cancer versus metastatic spread from his prostate.  They state they are able to do this. [DW]    Clinical Course User Index [DW] Malvina Alm DASEN, MD     FINAL CLINICAL IMPRESSION(S) / ED DIAGNOSES   Final diagnoses:  Acute cystitis with hematuria  Lower abdominal pain  Bladder mass     Rx / DC Orders   ED Discharge Orders          Ordered    Ambulatory referral to Urology       Comments: Bladder cancer and complicated cystitis   08/16/23 1757    Ambulatory referral to Hematology / Oncology        08/16/23 1757             Note:  This document was prepared using Dragon voice recognition software and may include unintentional dictation errors.   Malvina Alm DASEN, MD 08/16/23 [phone]

## 2023-08-16 NOTE — ED Notes (Signed)
 Pt given food in the ED, Stable at this time

## 2023-08-16 NOTE — ED Triage Notes (Signed)
 Pt comes via [facility] from [facility] center with c/o belly pain for two week. Pt had UTI week ago. Pt states center lower belly pain. Pt worse with movement and touch.   Pt has hx of renal failure. Pt denies any N/V. 78 100% RA 134/76 98.5 temp

## 2023-08-16 NOTE — ED Notes (Signed)
 Report called to [facility] and spoke with Pamila Ill, who stated that patient had taken his prescribed antibiotic and was to start another antibiotic today, but left to come to the ED prior to starting it. Patient is compliant with meds and is able to eat meals without difficulty. She also stated that patient just finished radiation for Cancer. Pamila Ill also stated that the new antibiotic is Amoxicillin . Dr. Malvina aware. She also stated that patient would need to go back by EMS.

## 2023-08-16 NOTE — Discharge Instructions (Signed)
 He still has an ongoing urinary tract infection and we recommend completing the antibiotics since originally as well as the additional antibiotics you received from the pharmacy today.  Separately, he will need to follow-up with a urologist as well as an oncologist.  I have placed referrals for these.  He has a prior history of prostate cancer and there may be a chance that he has had spread into his bladder which is why he needs these follow-ups.  Separately, there is also a nodule in his right lower lungs which could also represent cancer spread.  Please make sure he gets these appointments as planned.  Please have him follow-up with his primary care provider as well.  Please return him for any severe or worsening symptoms.

## 2023-08-20 ENCOUNTER — Telehealth: Payer: Self-pay | Admitting: Cardiology

## 2023-08-20 ENCOUNTER — Encounter: Payer: Self-pay | Admitting: Cardiology

## 2023-08-20 ENCOUNTER — Ambulatory Visit: Admitting: Physician Assistant

## 2023-08-20 ENCOUNTER — Ambulatory Visit: Attending: Cardiology | Admitting: Cardiology

## 2023-08-20 VITALS — BP 137/73 | HR 81 | Ht 68.0 in | Wt 103.8 lb

## 2023-08-20 DIAGNOSIS — B20 Human immunodeficiency virus [HIV] disease: Secondary | ICD-10-CM | POA: Insufficient documentation

## 2023-08-20 DIAGNOSIS — R319 Hematuria, unspecified: Secondary | ICD-10-CM | POA: Diagnosis present

## 2023-08-20 DIAGNOSIS — F0280 Dementia in other diseases classified elsewhere without behavioral disturbance: Secondary | ICD-10-CM | POA: Diagnosis present

## 2023-08-20 DIAGNOSIS — R918 Other nonspecific abnormal finding of lung field: Secondary | ICD-10-CM | POA: Diagnosis not present

## 2023-08-20 DIAGNOSIS — N179 Acute kidney failure, unspecified: Secondary | ICD-10-CM | POA: Diagnosis present

## 2023-08-20 DIAGNOSIS — I2782 Chronic pulmonary embolism: Secondary | ICD-10-CM | POA: Diagnosis present

## 2023-08-20 DIAGNOSIS — N390 Urinary tract infection, site not specified: Secondary | ICD-10-CM | POA: Diagnosis present

## 2023-08-20 DIAGNOSIS — N1832 Chronic kidney disease, stage 3b: Secondary | ICD-10-CM | POA: Insufficient documentation

## 2023-08-20 DIAGNOSIS — I214 Non-ST elevation (NSTEMI) myocardial infarction: Secondary | ICD-10-CM | POA: Diagnosis not present

## 2023-08-20 DIAGNOSIS — I10 Essential (primary) hypertension: Secondary | ICD-10-CM | POA: Diagnosis not present

## 2023-08-20 DIAGNOSIS — I2489 Other forms of acute ischemic heart disease: Secondary | ICD-10-CM | POA: Insufficient documentation

## 2023-08-20 NOTE — Patient Instructions (Signed)
 Medication Instructions:  Your physician recommends that you continue on your current medications as directed. Please refer to the Current Medication list given to you today.   *If you need a refill on your cardiac medications before your next appointment, please call your pharmacy*  Lab Work: No labs ordered today  If you have labs (blood work) drawn today and your tests are completely normal, you will receive your results only by: MyChart Message (if you have MyChart) OR A paper copy in the mail If you have any lab test that is abnormal or we need to change your treatment, we will call you to review the results.  Testing/Procedures: No test ordered today   Follow-Up: At [facility], you and your health needs are our priority.  As part of our continuing mission to provide you with exceptional heart care, our providers are all part of one team.  This team includes your primary Cardiologist (physician) and Advanced Practice Providers or APPs (Physician Assistants and Nurse Practitioners) who all work together to provide you with the care you need, when you need it.  Your next appointment:   6 month(s)  Provider:   Ronald Cockayne, NP

## 2023-08-20 NOTE — Telephone Encounter (Signed)
 Jay Flores/[facility] calling to advise that she just faxed pt's med list and if we have any questions, changes please call her. Also her fax # is on the fax cover sheet with the med list.

## 2023-08-20 NOTE — Progress Notes (Signed)
 Cardiology Office Note   Date:  08/20/2023  ID:  AUM CAGGIANO, DOB 12/26/1960, MRN [id number] PCP: [name], MD  [facility] Providers Cardiologist:  None     History of Present Illness Jay Flores is a 63 y.o. male with a past medical history of HIV on triple therapy, HIV dementia, hepatitis C status post Harvoni treatment, non-insulin -dependent type 2 diabetes, CKD stage IV, incidental papillary mucocyst neoplasm, and BPH with a chronic catheter who was recently evaluated in the emergency department for NSTEMI who is here today for hospital follow-up.   He presented to [facility] emergency department 07/29/2023 with initial complaints of left-sided chest pain.  On arrival to the emergency department he denied chest pain but stated he becomes short of breath approximately 3 days prior to arrival.  Initial respiratory rate of 22 otherwise normal vital signs.  Labs are notable for blood glucose of 218, BUN 33, significantly 23, WBCs of 12.4, hemoglobin 9.1, hematocrit 30.8, troponin 0.46 and 511.  EKG showed sinus rhythm without acute ST/T wave abnormalities.  Chest x-ray with bilateral pneumonia.  Perfusion scan was concerning for acute PE in the right lower lobe.  Patient was started on IV heparin  and admitted for further evaluation.  Cardiology was asked to consult for concern of NSTEMI.  Acute pulmonary embolism with a left lower extremity DVT.  He was continued on apixaban .  Echocardiogram revealed an LVEF of 60 to 65%, diastolic function could not be evaluated, RV function was moderately reduced, and could not evaluate regional wall motion abnormalities.  NSTEMI was ruled out and it was considered to be demand ischemia from acute PE.  He was considered stable for discharge and was continued on his current medications on 08/03/2023.   He presented back to the [facility] emergency department for abdominal pain and hematuria.  Blood pressure was elevated at 158/81, pulse of 101,  respirations of 23, labs were pertinent for hemoglobin of 7.4, blood glucose of 166, serum creatinine of 2.77, GFR of 25.  EKG revealed sinus tachycardia.  Foley catheter was replaced and a sample from his Foley was sent.  He was given a dose of IV antibiotics.  Was started on oral antibiotic therapy for lower urinary tract infection.  He was discharged back to the facility.  Patient presented today over the emergency department 08/16/2023 from prolonged care center with complaint of abdominal pain for 2 weeks.  He was recently diagnosed with a UTI a week prior.  Blood pressure was 125/73, pulse of 76, respiration of 18.  Serum creatinine 2.62, albumin  2.6, AST 12, GFR 27, hemoglobin 7.6, urine color was turbid.  He was treated with Rocephin  and Tylenol .  ED provider called the assisted living facility and they are aware he has a UTI and he was sent a second antibiotic today for full coverage but has not received the first dose.  He was otherwise stable and there was no indication for admission to the hospital.  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  They recommended he follow-up with urology.  He returns to clinic today with continued complaints of abdominal pain.  He still has a Foley catheter which shows turbid urine in the bag.  He was sent over from assisted living without updated medication list or paperwork.  Patient is unsure what medications that he is currently on.  He denies any chest  pain or shortness of breath but continues to complain of abdominal pain.  States this medication given to him on a regular basis.  Has had multiple trips to the emergency department.  Per record he does have a referral to urology for concerning bladder mass.  ROS: 10 point review of systems has been reviewed and considered negative except ones are listed in the HPI  Studies Reviewed EKG  Interpretation Date/Time:  Tuesday August 20 2023 09:21:56 EDT Ventricular Rate:  81 PR Interval:  120 QRS Duration:  88 QT Interval:  384 QTC Calculation: 446 R Axis:   74  Text Interpretation: Normal sinus rhythm Minimal voltage criteria for LVH, may be normal variant ( Sokolow-Lyon ) Nonspecific ST abnormality When compared with ECG of 11-Aug-2023 08:43, PREVIOUS ECG IS PRESENT No significant change since last tracing Confirmed by Gerard Frederick ([id number]) on 08/20/2023 9:30:48 AM    2D echo 07/30/2023 1. Left ventricular ejection fraction, by estimation, is 60 to 65%. The  left ventricle has normal function. Left ventricular endocardial border  not optimally defined to evaluate regional wall motion. Left ventricular  diastolic function could not be  evaluated.   2. Right ventricular systolic function is moderately reduced. The right  ventricular size is normal. There is normal pulmonary artery systolic  pressure.   3. The mitral valve is normal in structure. Trivial mitral valve  regurgitation.   4. The aortic valve is tricuspid. There is moderate thickening of the  aortic valve. Aortic valve regurgitation is mild. Aortic valve gradient  not assessed due to absent apical windows.   5. The inferior vena cava is normal in size with greater than 50%  respiratory variability, suggesting right atrial pressure of 3 mmHg.  Risk Assessment/Calculations           Physical Exam VS:  BP 137/73   Pulse 81   Ht 5' 8 (1.727 m)   Wt 103 lb 12.8 oz (47.1 kg)   SpO2 99%   BMI 15.78 kg/m        Wt Readings from Last 3 Encounters:  08/20/23 103 lb 12.8 oz (47.1 kg)  08/11/23 114 lb 4.8 oz (51.8 kg)  07/29/23 111 lb 5.3 oz (50.5 kg)    GEN: Well nourished, well developed in no acute distress NECK: No JVD; No carotid bruits CARDIAC: RRR, no murmurs, rubs, gallops RESPIRATORY:  Clear to auscultation without rales, wheezing or rhonchi  ABDOMEN: Soft, mildly tender tender, non-distended,  Foley intact draining to gravity with turbid colored urine noted in the Foley bag EXTREMITIES:  No edema; No deformity   ASSESSMENT AND PLAN Pulmonary embolism noted on perfusion imaging during recent hospitalization in the right lower lobe.  Initially was started on IV heparin .  Was discharged on apixaban .  Currently on apixaban  5 mg twice daily and will likely remain on anticoagulation for 3 to 6 months.  Ongoing management per PCP.  Demand ischemia during recent hospitalization with high-sensitivity troponin peaking at 511, no acute ischemic changes noted on EKG.  Denied any ongoing chest discomfort.  Echocardiogram was completed.  He continued on IV heparin  for minimum of 48 hours prior to being transitioned to oral apixaban .  He is continued on apixaban  and lieu of aspirin and atorvastatin  20 mg daily.  His chest pain-free today with EKG revealing sinus rhythm with rate 81, LVH, nonspecific T wave abnormalities and no ischemic changes.  No further ischemic evaluation at this time.  CKD stage IV with a serum creatinine of 2.63  which appears to be at baseline.  Continue to avoid nephrotoxic medications were able.  HIV at baseline with dementia.  Continued on his medication regimen.  Ongoing management per PCP.  Right pulmonary infiltrate on chest x-ray with leukocytosis.  Patient was discharged with continued antibiotic therapy.  UTI weight continues to complain of abdominal pain and turbid colored urine.  Scans concerning for bladder mass.  Encouraged to continue to follow-up with urology.  Primary pretension with a blood pressure 137/73.  He has continued on losartan  25 mg daily, and Toprol -XL 100 mg daily.  Regular monitoring of the blood pressure is recommended by the facility.        Dispo: Patient to return to clinic to see MD/APP in 6 months or sooner if needed for further evaluation.  We have also requested updated labs and medication list from the assisted living  facility.  Signed, Isham Smitherman, NP

## 2023-08-20 NOTE — Progress Notes (Deleted)
 08/21/2023 9:18 PM   Jay Flores December 02, 1960 [id number]  Referring provider: [name], MD [address] [place],  [place] [address]  Urological history: 1. Unfavorable intermediate risk prostate cancer - completed IMRT (06/2023) - completed 6 months ADT (04/2023)  2. Urinary retention - cysto (2022) hypotonic bladder  - managed w/ indwelling Foley  No chief complaint on file.  HPI: Jay Flores is a 63 y.o. man who presents today for catheter exchange.   Previous records reviewed.   He has had some hospital admissions and ED visits for chest pain and more recently for gross hematuria and abdominal pain.    He had a non contrast CT in May which noted abnormal anterior bladder wall thickening and non contrast CT in July noted bilateral hydronephrosis with thickening of the bladder suspicious for a bladder neoplasm and an enlarged lymph node posterior to the urinary bladder suspicious for a metastatic node.    He had a cysto in December 2024 w/o finding gross solid or papillary lesions.   Urine cytology was negative for suspicious cells.    He has also been diagnosed with an UTI.    He is a smoker.    UA ***  Lab Results  Component Value Date   WBC 7.1 08/16/2023   HGB 7.6 (L) 08/16/2023   HCT 25.7 (L) 08/16/2023   MCV 91.1 08/16/2023   PLT 253 08/16/2023    Lab Results  Component Value Date   CREATININE 2.62 (H) 08/16/2023    Lab Results  Component Value Date   HGBA1C 6.3 (H) 02/21/2023    PMH: Past Medical History:  Diagnosis Date   Acute renal failure (HCC)    Altered mental status 01/20/2012   Anemia    Brain lesion    Cholelithiasis    Chronic indwelling Foley catheter    CKD (chronic kidney disease) stage 3, GFR 30-59 ml/min (HCC)    COPD (chronic obstructive pulmonary disease) (HCC)    Diabetic neuropathy (HCC)    DKA (diabetic ketoacidosis) (HCC)    DM (diabetes mellitus), type 2 (HCC)    Elevated PSA    Gait  instability    uses walker   H/O urinary retention    Hepatitis C infection    s/p Harvoni   History of cocaine use    last used in 2012   HIV (human immunodeficiency virus infection) (HCC)    HIV dementia (HCC)    Hypertension    IPMN (intraductal papillary mucinous neoplasm)    Lives in group home    [facility]   Metabolic acidosis    Pancreatic cyst    Pneumonia    Thrombocytopenia (HCC)    Tobacco use     Surgical History: Past Surgical History:  Procedure Laterality Date   COLONOSCOPY WITH PROPOFOL  N/A 09/05/2022   Procedure: COLONOSCOPY WITH PROPOFOL ;  Surgeon: Toledo, Ladell POUR, MD;  Location: [facility] ENDOSCOPY;  Service: Gastroenterology;  Laterality: N/A;   EUS N/A 04/19/2022   Procedure: UPPER ENDOSCOPIC ULTRASOUND (EUS) LINEAR;  Surgeon: Elta Fonda SQUIBB, MD;  Location: [facility] ENDOSCOPY;  Service: Gastroenterology;  Laterality: N/A;  Requesting 1p slot   HEMOSTASIS CLIP PLACEMENT  09/05/2022   Procedure: HEMOSTASIS CLIP PLACEMENT;  Surgeon: Aundria, Ladell POUR, MD;  Location: [facility] ENDOSCOPY;  Service: Gastroenterology;;   POLYPECTOMY  09/05/2022   Procedure: POLYPECTOMY;  Surgeon: Aundria, Ladell POUR, MD;  Location: [facility] ENDOSCOPY;  Service: Gastroenterology;;   PROSTATE BIOPSY N/A 02/26/2023  Procedure: PROSTATE BIOPSY;  Surgeon: Twylla Glendia BROCKS, MD;  Location: [facility] ORS;  Service: Urology;  Laterality: N/A;   TRANSRECTAL ULTRASOUND N/A 02/26/2023   Procedure: TRANSRECTAL ULTRASOUND;  Surgeon: Twylla Glendia BROCKS, MD;  Location: [facility] ORS;  Service: Urology;  Laterality: N/A;    Home Medications:  Allergies as of 08/21/2023   No Known Allergies      Medication List        Accurate as of August 20, 2023  9:18 PM. If you have any questions, ask your nurse or doctor.          Accu-Chek Aviva Plus test strip Generic drug: glucose blood 1 each by Other route in the morning, at noon, and at bedtime. Use as instructed   Accu-Chek Softclix  Lancets lancets by Other route. Use as instructed   apixaban  5 MG Tabs tablet Commonly known as: ELIQUIS  Take 2 tablets (10 mg total) by mouth 2 (two) times daily for 4 days, THEN 1 tablet (5 mg total) 2 (two) times daily. Start taking on: August 03, 2023   atorvastatin  20 MG tablet Commonly known as: LIPITOR Take 20 mg by mouth at bedtime.   cephALEXin  500 MG capsule Commonly known as: KEFLEX  Take 1 capsule (500 mg total) by mouth 3 (three) times daily.   cholecalciferol  25 MCG (1000 UNIT) tablet Commonly known as: VITAMIN D3 Take 1,000 Units by mouth at bedtime.   finasteride  5 MG tablet Commonly known as: PROSCAR  Take 5 mg by mouth daily.   folic acid  1 MG tablet Commonly known as: FOLVITE  Take 1 tablet (1 mg total) by mouth daily.   FreeStyle Libre 14 Day Reader Espiridion Apply topically.   FreeStyle Libre 14 Day Sensor Misc Apply topically.   Jardiance 25 MG Tabs tablet Generic drug: empagliflozin Take 25 mg by mouth in the morning.   Juluca  50-25 MG tablet Generic drug: dolutegravir -rilpivirine  Take 1 tablet by mouth daily.   loperamide  2 MG capsule Commonly known as: IMODIUM  Take 2 mg by mouth 4 (four) times daily as needed for diarrhea or loose stools.   losartan  25 MG tablet Commonly known as: COZAAR  Take 25 mg by mouth daily.   metFORMIN 500 MG tablet Commonly known as: GLUCOPHAGE Take 500 mg by mouth daily with breakfast.   metoprolol  succinate 100 MG 24 hr tablet Commonly known as: TOPROL -XL Take 100 mg by mouth daily.   neomycin-bacitracin-polymyxin [phone] Oint Apply 1 Application topically daily.   nicotine  21 mg/24hr patch Commonly known as: NICODERM CQ  - dosed in mg/24 hours Place 1 patch (21 mg total) onto the skin daily.   sodium bicarbonate  650 MG tablet Take 650 mg by mouth 2 (two) times daily.   sucralfate 1 g tablet Commonly known as: CARAFATE Take 1 g by mouth 2 (two) times daily.   Tylenol  8 Hour Arthritis Pain 650 MG CR  tablet Generic drug: acetaminophen  Take 650 mg by mouth every 4 (four) hours as needed for pain.   Vilazodone  HCl 20 MG Tabs Take 20 mg by mouth daily.        Allergies: No Known Allergies  Family History: Family History  Problem Relation Age of Onset   Diabetes Mellitus II Mother     Social History: See HPI for pertinent social history  ROS: Pertinent ROS in HPI  Physical Exam: There were no vitals taken for this visit.  Constitutional:  Well nourished. Alert and oriented, No acute distress. HEENT: [place] AT, moist mucus membranes.  Trachea midline, no  masses. Cardiovascular: No clubbing, cyanosis, or edema. Respiratory: Normal respiratory effort, no increased work of breathing. GI: Abdomen is soft, non tender, non distended, no abdominal masses. Liver and spleen not palpable.  No hernias appreciated.  Stool sample for occult testing is not indicated.   GU: No CVA tenderness.  No bladder fullness or masses.  Patient with circumcised/uncircumcised phallus. ***Foreskin easily retracted***  Urethral meatus is patent.  No penile discharge. No penile lesions or rashes. Scrotum without lesions, cysts, rashes and/or edema.  Testicles are located scrotally bilaterally. No masses are appreciated in the testicles. Left and right epididymis are normal. Rectal: Patient with  normal sphincter tone. Anus and perineum without scarring or rashes. No rectal masses are appreciated. Prostate is approximately *** grams, *** nodules are appreciated. Seminal vesicles are normal. Skin: No rashes, bruises or suspicious lesions. Lymph: No cervical or inguinal adenopathy. Neurologic: Grossly intact, no focal deficits, moving all 4 extremities. Psychiatric: Normal mood and affect.  Laboratory Data: See EPIC and HPI  I have reviewed the labs.   Pertinent Imaging: CLINICAL DATA:  63 year old male with acute abdominal pain.   EXAM: CT ABDOMEN AND PELVIS WITHOUT CONTRAST   TECHNIQUE: Multidetector  CT imaging of the abdomen and pelvis was performed following the standard protocol without IV contrast.   RADIATION DOSE REDUCTION: This exam was performed according to the departmental dose-optimization program which includes automated exposure control, adjustment of the mA and/or kV according to patient size and/or use of iterative reconstruction technique.   COMPARISON:  CT Abdomen 12/21/2008.   FINDINGS: Lower chest: Elevation of the left hemidiaphragm is new since 2010, and from portable chest x-ray this year 04/24/2023. No cardiomegaly. No pericardial effusion. Confluent right lower lobe, costophrenic angle pulmonary ground-glass opacity. No other significant lung base opacity.   Hepatobiliary: Cholelithiasis within a contracted gallbladder. No pericholecystic inflammation. Negative noncontrast liver. No bile duct enlargement is evident.   Pancreas: Highly abnormal. Subtotal pancreatic atrophy superimposed on extensive parenchymal dystrophic calcifications and dilated main pancreatic duct. These findings terminates at the duodenum C-loop where a rounded cystic mass is present on series 2, image 28 and further described below. No peripancreatic inflammation.   Spleen: The entire superior pole of the spleen is not included but the visible noncontrast spleen appears negative.   Adrenals/Urinary Tract: Normal adrenal glands. Nonobstructed kidneys. Diminutive ureters.   Abnormal urinary bladder. A Foley catheter is within the bladder lumen. The base of the bladder is decompressed but there is severe abnormal anterior bladder wall thickening and a spiculated appearance which is indeterminate for inflammation (series 2, image 66). Abnormal thickening there of slightly over 3 cm as seen on coronal image 30. The dome of the bladder contains gas and is non thickened. See sagittal image 84. Pelvic phleboliths.   Stomach/Bowel: Redundant but decompressed large bowel in the  pelvis. Mild descending colon retained stool.   Moderate transverse colon gaseous distension more so than retained stool. Decompressed flexures. Retained stool in the right colon without abnormal distention. Normal gas containing appendix on coronal image 44.   Nondilated small bowel. Stomach is also mostly decompressed. However, there is a rounded 3.4 cm cystic mass with simple fluid density at the gastroduodenal junction, adjacent to abnormal pancreas (series 2, image 28 and coronal image 34), new since 2010. No regional inflammation. However, this may be a portion of the larger cystic mass which has an hourglass configuration (coronal image 33 uncertain). Duodenum is nondilated.   No pneumoperitoneum. No free fluid. No mesenteric  inflammation identified.   Vascular/Lymphatic: Aortoiliac calcified atherosclerosis. Normal caliber abdominal aorta. Vascular patency is not evaluated in the absence of IV contrast. No lymphadenopathy identified.   Reproductive: Urethral catheter in place.   Other: No pelvis free fluid.   Musculoskeletal: No acute or suspicious osseous lesion.   IMPRESSION: ABDOMEN:   1. Extensive pancreatic atrophy and dystrophic calcification with dilated main pancreatic duct terminating at a Cystic Mass at the duodenal C-loop (3.4 cm unilocular versus larger hour glass shaped, see coronal images 33 and 34), which is new since 2010. No regional inflammation or lymphadenopathy. Differential consideration includes obstructing cystic neoplasm, pseudocyst, choledochal cyst, enteric cyst. 2. Gallstones within contracted gallbladder. No CT evidence of acute cholecystitis or hepatic bile duct obstruction.   PELVIS:   1. Bulky tumor versus inflammatory wall thickening at the anterior base of the urinary bladder, 3.2 cm in thickness. Foley catheter within the bladder. No regional lymphadenopathy. Kidneys and ureters appear nonobstructed.   CHEST:   1. Right  lower lobe pulmonary ground-glass opacity more suspicious for infection than atelectasis. No pleural fluid. 2. Elevation of the left hemidiaphragm is new since 2010 and from portable chest x-ray this year. 3.  Aortic Atherosclerosis (ICD10-I70.0).     Electronically Signed   By: VEAR Hurst M.D.   On: 07/06/2023 10:34  CLINICAL DATA:  Constant central lower abdominal pain for the past several weeks, worsening. Urinary tract infection diagnosed 1 week ago.   EXAM: CT ABDOMEN AND PELVIS WITHOUT CONTRAST   TECHNIQUE: Multidetector CT imaging of the abdomen and pelvis was performed following the standard protocol without IV contrast.   RADIATION DOSE REDUCTION: This exam was performed according to the departmental dose-optimization program which includes automated exposure control, adjustment of the mA and/or kV according to patient size and/or use of iterative reconstruction technique.   COMPARISON:  07/06/2023   FINDINGS: Lower chest: Interval pleural thickening and nodularity along the posterolateral right lower lobe in the area of previously demonstrated ground-glass opacity. The ground-glass opacity has resolved. Clear left lung base. Borderline enlarged heart.   Hepatobiliary: Normal-appearing liver. Multiple gallstones are again demonstrated in the gallbladder, measuring up to 9 mm in diameter each. No gallbladder wall thickening or pericholecystic fluid.   Pancreas: Stable extensive coarse pancreatic calcifications compatible with chronic pancreatitis. No evidence of acute pancreatitis.   Spleen: Normal in size without focal abnormality.   Adrenals/Urinary Tract: Normal-appearing adrenal glands interval moderate dilatation of the right renal collecting system and ureter to the level of the ureterovesical junction. No obstructing calculus visualized. Stable grossly simple appearing lower pole left renal cyst measuring 16 Hounsfield units in density on coronal  image number 37/5. Unremarkable left ureter.   Marked diffuse medium density wall thickening involving the urinary bladder with a Foley catheter in place is again demonstrated. There are 2 clips again demonstrated in the posterior bladder on the left.   Stomach/Bowel: The rectum is distended with stool with no rectal wall thickening. Prominent stool in the sigmoid colon. Normal-appearing appendix. The stomach is unremarkable.   The previously demonstrated 3.4 cm cystic mass in the region of the duodenal C-loop is smaller, currently measuring 2.2 cm in maximum diameter.   Vascular/Lymphatic: Atheromatous arterial calcifications without aneurysm. Interval enlarged lymph node posterior to the urinary bladder on the right and lateral to the rectum on the right, measuring 1.3 cm in short axis diameter on image number 63/2.   Reproductive: Penile prosthesis.  Normal-sized prostate gland.   Other: No abdominal wall  hernia or abnormality. No abdominopelvic ascites.   Musculoskeletal: Minimal lumbar spine degenerative changes. Bilateral L5 pars interarticularis defects without subluxation.   IMPRESSION: 1. Interval moderate hydronephrosis and hydroureter to the level of the ureterovesical junction. No obstructing calculus visualized. This is most likely due to the marked diffuse bladder wall thickening. 2. Marked diffuse medium density wall thickening involving the urinary bladder with a Foley catheter in place. This is suspicious for a bladder neoplasm. 3. Interval enlarged lymph node posterior to the urinary bladder on the right, suspicious for a metastatic node. 4. Interval pleural thickening and nodularity along the posterolateral right lower lobe in the area of previously demonstrated ground-glass opacity. The ground-glass opacity has resolved. This is nonspecific and could be due to developing pleural and parenchymal scarring or pleural neoplasm. 5. Cholelithiasis. 6. Stable  changes of chronic pancreatitis. 7. The previously demonstrated 3.4 cm cystic mass in the region of the duodenal C-loop is smaller, currently measuring 2.2 cm in maximum diameter. This suggests a pancreatic pseudocyst which has improved. 8. Prominent stool in the sigmoid colon and rectum.     Electronically Signed   By: Elspeth Bathe M.D.   On: 08/16/2023 16:32 I have independently reviewed the films.  See HPI.   Cath Change/ Replacement  Patient is present today for a catheter change due to urinary retention.  9 ml of water was removed from the balloon, a 18 FR Coude foley cath was removed without difficulty.  Patient was cleaned and prepped in a sterile fashion with betadine and 2% lidocaine  jelly was instilled into the urethra. A 18  FR Coude foley cath was replaced into the bladder, no complications were noted. Urine return was noted ***ml and urine was *** in color. The balloon was filled with 10ml of sterile water. A *** bag was attached for drainage.  A night bag was also given to the patient and patient was given instruction on how to change from one bag to another. Patient was given proper instruction on catheter care.    Performed by: ***   Assessment & Plan:    1. Prostate cancer -scheduled for follow up PSA in September  2. Gross hematuria - UA *** - urine culture - may be secondary to UTI's, radiation or malignancy - continue Amoxicillin  - will need cystoscopy, bilateral retrogrades and bladder biopsies for further evaluation  3. Urinary retention - catheter exchanged today  - continue monthly exchanges    No follow-ups on file.  These notes generated with voice recognition software. I apologize for typographical errors.  Jay Flores  [facility] [address]  [address] [place], [place] [address] [phone]

## 2023-08-21 ENCOUNTER — Ambulatory Visit: Admitting: Urology

## 2023-08-21 DIAGNOSIS — R310 Gross hematuria: Secondary | ICD-10-CM

## 2023-08-21 DIAGNOSIS — C61 Malignant neoplasm of prostate: Secondary | ICD-10-CM

## 2023-08-21 DIAGNOSIS — Z466 Encounter for fitting and adjustment of urinary device: Secondary | ICD-10-CM

## 2023-08-22 ENCOUNTER — Inpatient Hospital Stay

## 2023-08-22 ENCOUNTER — Inpatient Hospital Stay: Attending: Oncology | Admitting: Oncology

## 2023-08-22 ENCOUNTER — Telehealth: Payer: Self-pay | Admitting: Urology

## 2023-08-22 ENCOUNTER — Encounter: Payer: Self-pay | Admitting: Oncology

## 2023-08-22 VITALS — BP 143/70 | HR 96 | Temp 97.2°F | Resp 18 | Wt 105.0 lb

## 2023-08-22 DIAGNOSIS — D631 Anemia in chronic kidney disease: Secondary | ICD-10-CM | POA: Diagnosis not present

## 2023-08-22 DIAGNOSIS — N329 Bladder disorder, unspecified: Secondary | ICD-10-CM | POA: Insufficient documentation

## 2023-08-22 DIAGNOSIS — N1832 Chronic kidney disease, stage 3b: Secondary | ICD-10-CM | POA: Diagnosis not present

## 2023-08-22 DIAGNOSIS — K861 Other chronic pancreatitis: Secondary | ICD-10-CM | POA: Diagnosis not present

## 2023-08-22 DIAGNOSIS — F0280 Dementia in other diseases classified elsewhere without behavioral disturbance: Secondary | ICD-10-CM | POA: Insufficient documentation

## 2023-08-22 DIAGNOSIS — D649 Anemia, unspecified: Secondary | ICD-10-CM | POA: Diagnosis not present

## 2023-08-22 DIAGNOSIS — Z8546 Personal history of malignant neoplasm of prostate: Secondary | ICD-10-CM | POA: Diagnosis not present

## 2023-08-22 DIAGNOSIS — N3289 Other specified disorders of bladder: Secondary | ICD-10-CM | POA: Diagnosis not present

## 2023-08-22 DIAGNOSIS — F17210 Nicotine dependence, cigarettes, uncomplicated: Secondary | ICD-10-CM | POA: Diagnosis not present

## 2023-08-22 DIAGNOSIS — Z21 Asymptomatic human immunodeficiency virus [HIV] infection status: Secondary | ICD-10-CM | POA: Insufficient documentation

## 2023-08-22 DIAGNOSIS — C61 Malignant neoplasm of prostate: Secondary | ICD-10-CM

## 2023-08-22 DIAGNOSIS — K862 Cyst of pancreas: Secondary | ICD-10-CM | POA: Insufficient documentation

## 2023-08-22 DIAGNOSIS — N184 Chronic kidney disease, stage 4 (severe): Secondary | ICD-10-CM

## 2023-08-22 LAB — RETIC PANEL
Immature Retic Fract: 14.1 % (ref 2.3–15.9)
RBC.: 2.76 MIL/uL — ABNORMAL LOW (ref 4.22–5.81)
Retic Count, Absolute: 32.8 K/uL (ref 19.0–186.0)
Retic Ct Pct: 1.2 % (ref 0.4–3.1)
Reticulocyte Hemoglobin: 25.4 pg — ABNORMAL LOW (ref >27.9)

## 2023-08-22 LAB — CBC WITH DIFFERENTIAL/PLATELET
Abs Immature Granulocytes: 0.03 K/uL (ref 0.00–0.07)
Basophils Absolute: 0 K/uL (ref 0.0–0.1)
Basophils Relative: 0 %
Eosinophils Absolute: 0.1 K/uL (ref 0.0–0.5)
Eosinophils Relative: 1 %
HCT: 25.3 % — ABNORMAL LOW (ref 39.0–52.0)
Hemoglobin: 7.6 g/dL — ABNORMAL LOW (ref 13.0–17.0)
Immature Granulocytes: 0 %
Lymphocytes Relative: 14 %
Lymphs Abs: 1.3 K/uL (ref 0.7–4.0)
MCH: 27.5 pg (ref 26.0–34.0)
MCHC: 30 g/dL (ref 30.0–36.0)
MCV: 91.7 fL (ref 80.0–100.0)
Monocytes Absolute: 0.3 K/uL (ref 0.1–1.0)
Monocytes Relative: 4 %
Neutro Abs: 7.6 K/uL (ref 1.7–7.7)
Neutrophils Relative %: 81 %
Platelets: 232 K/uL (ref 150–400)
RBC: 2.76 MIL/uL — ABNORMAL LOW (ref 4.22–5.81)
RDW: 14.5 % (ref 11.5–15.5)
WBC: 9.4 K/uL (ref 4.0–10.5)
nRBC: 0 % (ref 0.0–0.2)

## 2023-08-22 LAB — IRON AND TIBC
Iron: 17 ug/dL — ABNORMAL LOW (ref 45–182)
Saturation Ratios: 9 % — ABNORMAL LOW (ref 17.9–39.5)
TIBC: 185 ug/dL — ABNORMAL LOW (ref 250–450)
UIBC: 168 ug/dL

## 2023-08-22 LAB — FOLATE: Folate: >40 ng/mL (ref >5.9)

## 2023-08-22 LAB — VITAMIN B12: Vitamin B-12: 624 pg/mL (ref 180–914)

## 2023-08-22 LAB — FERRITIN: Ferritin: 79 ng/mL (ref 24–336)

## 2023-08-22 MED ORDER — APIXABAN 5 MG PO TABS: 5.0000 mg | ORAL_TABLET | Freq: Two times a day (BID) | ORAL | 3 refills | Status: DC

## 2023-08-22 NOTE — Assessment & Plan Note (Addendum)
 Neoplasm versus reactive etiologies. Patient missed his appointment with urology.  I urge patient rescheduled appointment ASAP. He will need a biopsy of bladder mass to establish tissue diagnosis. Obtain CT chest without contrast to finish staging.

## 2023-08-22 NOTE — Assessment & Plan Note (Signed)
 History of prostate cancer.  Status post radiation. Follow-up PSA levels.

## 2023-08-22 NOTE — Assessment & Plan Note (Signed)
 Labs are reviewed and discussed with patient. Lab Results  Component Value Date   HGB 7.6 (L) 08/22/2023   TIBC 185 (L) 08/22/2023   IRONPCTSAT 9 (L) 08/22/2023   FERRITIN 79 08/22/2023    Recommend IV Venofer weekly x 4.  Rationale potential side effects were reviewed and discussed with patient.  He agrees with the plan.

## 2023-08-22 NOTE — Assessment & Plan Note (Addendum)
 Encourage oral hydration and avoid nephrotoxins.  Check protein electrophoresis

## 2023-08-22 NOTE — Addendum Note (Signed)
 Addended by: BABARA CALL on: 08/22/2023 08:59 PM   Modules accepted: Orders

## 2023-08-22 NOTE — Assessment & Plan Note (Signed)
 Imaging findings were reviewed and discussed with patient.-Large, multiseptated cystic pancreatic lesion normal CA 19-9.  PET scan was not approved Pancreatic cyst fluid CEA 159 S/p EUS biopsy non diagnostic Cyst was felt to be secondary to chronic pancreatitis.

## 2023-08-22 NOTE — Progress Notes (Addendum)
 Hematology/Oncology Progress note [phone] [phone]        REFERRING PROVIDER: [name], MD    CHIEF COMPLAINTS/PURPOSE OF CONSULTATION:  Abnormal CT scan  ASSESSMENT & PLAN:   Pancreatic cyst Imaging findings were reviewed and discussed with patient.-Large, multiseptated cystic pancreatic lesion normal CA 19-9.  PET scan was not approved Pancreatic cyst fluid CEA 159 S/p EUS biopsy non diagnostic Cyst was felt to be secondary to chronic pancreatitis.  Bladder mass Neoplasm versus reactive etiologies. Patient missed his appointment with urology.  I urge patient rescheduled appointment ASAP. He will need a biopsy of bladder mass to establish tissue diagnosis. Obtain CT chest without contrast to finish staging.   CKD (chronic kidney disease) stage 3, GFR 30-59 ml/min (HCC) Encourage oral hydration and avoid nephrotoxins.  Check protein electrophoresis   Anemia in chronic kidney disease (CKD) Labs are reviewed and discussed with patient. Lab Results  Component Value Date   HGB 7.6 (L) 08/22/2023   TIBC 185 (L) 08/22/2023   IRONPCTSAT 9 (L) 08/22/2023   FERRITIN 79 08/22/2023    Recommend IV Venofer weekly x 4.  Rationale potential side effects were reviewed and discussed with patient.  He agrees with the plan.  Prostate cancer [facility]) History of prostate cancer.  Status post radiation. Follow-up PSA levels.   Orders Placed This Encounter  Procedures   CT Chest Wo Contrast    Standing Status:   Future    Expected Date:   08/29/2023    Expiration Date:   08/21/2024    Preferred imaging location?:   [facility]   Folate    Standing Status:   Future    Number of Occurrences:   1    Expected Date:   08/22/2023    Expiration Date:   11/20/2023   Vitamin B12    Standing Status:   Future    Number of Occurrences:   1    Expected Date:   08/22/2023    Expiration Date:   11/20/2023   CBC with Differential/Platelet     Standing Status:   Future    Number of Occurrences:   1    Expected Date:   08/22/2023    Expiration Date:   11/20/2023   Iron and TIBC    Standing Status:   Future    Number of Occurrences:   1    Expected Date:   08/22/2023    Expiration Date:   11/20/2023   Ferritin    Standing Status:   Future    Number of Occurrences:   1    Expected Date:   08/22/2023    Expiration Date:   11/20/2023   Retic Panel    Standing Status:   Future    Number of Occurrences:   1    Expected Date:   08/22/2023    Expiration Date:   11/20/2023   Protein electrophoresis, serum    Standing Status:   Future    Number of Occurrences:   1    Expected Date:   08/22/2023    Expiration Date:   11/20/2023   Hold Tube- Blood Bank    Standing Status:   Future    Expected Date:   08/22/2023    Expiration Date:   08/21/2024   Follow-up TBD All questions were answered. The patient knows to call the clinic with any problems, questions or concerns.  Zelphia Cap, MD, PhD [facility] Hematology Oncology 08/22/2023    HISTORY  OF PRESENTING ILLNESS:  Jay Flores 63 y.o. male presents to establish care for pancreatic cyst  Patient has a history of pancreatic cyst. 02/21/2022 MRI abdomen with and without contrast showed 1. Large, multiseptated cystic lesion occupying the majority of the pancreatic head and uncinate, measuring 7.6 x 5.7 x 4.6 cm. This may reflect a large, complex pseudocyst or cystic pancreatic neoplasm. No obvious significant solid component or contrast enhancement. Given large size recommend EUS/FNA for definitive diagnosis. 2. Severely atrophic pancreatic parenchyma with diffuse dilatation along the length of the pancreatic duct measuring up to 0.9 cm in caliber. No acute inflammatory findings. 3. Cholelithiasis. 4. Large burden of stool in the included colon.   Patient has a history of HIV, CKD stage IIIb, hypertension, diabetes.  BPH with lower urinary tract symptoms. 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  History of HIV dementia.   Hep C - s/p Harvoni for 12 weeks treatment  Patient reports balancing problem.  He uses wheelchair.  He lives in a group home. Patient denies any current alcohol use.  He is a daily smoker.  Status post EUS biopsy, pathology is nondiagnostic.    INTERVAL HISTORY Jay Flores is a 63 y.o. male who has above history reviewed by me today presents for follow up visit for abnormal CT scan. Patient was accompanied by facility staff.    07/29/2023 - 08/03/2023, patient was hospitalized due to chest pain.  NSTEMI was ruled out.  Patient was found to have acute pulm embolism with left lower extremity DVT.  Patient was started on anticoagulation and discharged on Eliquis .  Patient also has hematuria.  He has chronic Foley  Patient has an appointment with urology he missed  MEDICAL HISTORY:  Past Medical History:  Diagnosis Date   Acute renal failure (HCC)    Altered mental status 01/20/2012   Anemia    Brain lesion    Cholelithiasis    Chronic indwelling Foley catheter    CKD (chronic kidney disease) stage 3, GFR 30-59 ml/min (HCC)    COPD (chronic obstructive pulmonary disease) (HCC)    Diabetic neuropathy (HCC)    DKA (diabetic ketoacidosis) (HCC)    DM (diabetes mellitus), type 2 (HCC)    Elevated PSA    Gait instability    uses walker   H/O urinary retention    Hepatitis C infection    s/p Harvoni   History of cocaine use    last used in 2012   HIV (human immunodeficiency virus infection) (HCC)    HIV dementia (HCC)    Hypertension    IPMN (intraductal papillary mucinous neoplasm)    Lives in group home    [facility]   Metabolic acidosis    Pancreatic cyst    Pneumonia    Thrombocytopenia (HCC)    Tobacco use     SURGICAL HISTORY: Past Surgical History:  Procedure Laterality Date   COLONOSCOPY WITH PROPOFOL  N/A 09/05/2022   Procedure: COLONOSCOPY WITH PROPOFOL ;   Surgeon: Toledo, Ladell POUR, MD;  Location: [facility] ENDOSCOPY;  Service: Gastroenterology;  Laterality: N/A;   EUS N/A 04/19/2022   Procedure: UPPER ENDOSCOPIC ULTRASOUND (EUS) LINEAR;  Surgeon: Elta Fonda SQUIBB, MD;  Location: [facility] ENDOSCOPY;  Service: Gastroenterology;  Laterality: N/A;  Requesting 1p slot   HEMOSTASIS CLIP PLACEMENT  09/05/2022   Procedure: HEMOSTASIS CLIP PLACEMENT;  Surgeon: Aundria, Ladell POUR, MD;  Location: [facility] ENDOSCOPY;  Service: Gastroenterology;;   POLYPECTOMY  09/05/2022  Procedure: POLYPECTOMY;  Surgeon: Aundria, Ladell POUR, MD;  Location: [facility] ENDOSCOPY;  Service: Gastroenterology;;   PROSTATE BIOPSY N/A 02/26/2023   Procedure: PROSTATE BIOPSY;  Surgeon: Twylla Glendia BROCKS, MD;  Location: [facility] ORS;  Service: Urology;  Laterality: N/A;   TRANSRECTAL ULTRASOUND N/A 02/26/2023   Procedure: TRANSRECTAL ULTRASOUND;  Surgeon: Twylla Glendia BROCKS, MD;  Location: [facility] ORS;  Service: Urology;  Laterality: N/A;    SOCIAL HISTORY: Social History   Socioeconomic History   Marital status: Single    Spouse name: Not on file   Number of children: Not on file   Years of education: Not on file   Highest education level: Not on file  Occupational History   Occupation: disabled  Tobacco Use   Smoking status: Every Day    Current packs/day: 1.00    Average packs/day: 1 pack/day for 34.0 years (34.0 ttl pk-yrs)    Types: Cigarettes    Passive exposure: Current   Smokeless tobacco: Never  Vaping Use   Vaping status: Never Used  Substance and Sexual Activity   Alcohol use: No   Drug use: Not Currently    Types: Cocaine    Comment: 01/22/2012 last used cocaine ~ 2012   Sexual activity: Not Currently  Other Topics Concern   Not on file  Social History Narrative   Not on file   Social Drivers of Health   Financial Resource Strain: Not on file  Food Insecurity: No Food Insecurity (07/29/2023)   Hunger Vital Sign    Worried About Running Out of Food in the Last Year: Never  true    Ran Out of Food in the Last Year: Never true  Transportation Needs: No Transportation Needs (07/29/2023)   PRAPARE - Administrator, Civil Service (Medical): No    Lack of Transportation (Non-Medical): No  Physical Activity: Not on file  Stress: Not on file  Social Connections: Not on file  Intimate Partner Violence: Not At Risk (07/29/2023)   Humiliation, Afraid, Rape, and Kick questionnaire    Fear of Current or Ex-Partner: No    Emotionally Abused: No    Physically Abused: No    Sexually Abused: No    FAMILY HISTORY: Family History  Problem Relation Age of Onset   Diabetes Mellitus II Mother     ALLERGIES:  has no known allergies.  MEDICATIONS:  Current Outpatient Medications  Medication Sig Dispense Refill   Accu-Chek Softclix Lancets lancets by Other route. Use as instructed     acetaminophen  (TYLENOL  8 HOUR ARTHRITIS PAIN) 650 MG CR tablet Take 650 mg by mouth every 4 (four) hours as needed for pain.     amoxicillin  (AMOXIL ) 500 MG capsule Take 500 mg by mouth 3 (three) times daily.     apixaban  (ELIQUIS ) 5 MG TABS tablet Take 2 tablets (10 mg total) by mouth 2 (two) times daily for 4 days, THEN 1 tablet (5 mg total) 2 (two) times daily. 60 tablet 0   apixaban  (ELIQUIS ) 5 MG TABS tablet Take 1 tablet (5 mg total) by mouth 2 (two) times daily. 60 tablet 3   atorvastatin  (LIPITOR) 20 MG tablet Take 20 mg by mouth at bedtime.     cephALEXin  (KEFLEX ) 500 MG capsule Take 1 capsule (500 mg total) by mouth 3 (three) times daily. 30 capsule 0   cholecalciferol  (VITAMIN D3) 25 MCG (1000 UNIT) tablet Take 1,000 Units by mouth at bedtime.     Continuous Blood Gluc Receiver (FREESTYLE LIBRE 14 DAY  READER) DEVI Apply topically.     Continuous Blood Gluc Sensor (FREESTYLE LIBRE 14 DAY SENSOR) MISC Apply topically.     Dolutegravir -Rilpivirine  (JULUCA ) 50-25 MG TABS Take 1 tablet by mouth daily.     finasteride  (PROSCAR ) 5 MG tablet Take 5 mg by mouth daily.     folic  acid (FOLVITE ) 1 MG tablet Take 1 tablet (1 mg total) by mouth daily. 90 tablet 1   glucose blood (ACCU-CHEK AVIVA PLUS) test strip 1 each by Other route in the morning, at noon, and at bedtime. Use as instructed     JARDIANCE 25 MG TABS tablet Take 25 mg by mouth in the morning.     loperamide  (IMODIUM ) 2 MG capsule Take 2 mg by mouth 4 (four) times daily as needed for diarrhea or loose stools.     losartan  (COZAAR ) 25 MG tablet Take 25 mg by mouth daily.     metFORMIN (GLUCOPHAGE) 500 MG tablet Take 500 mg by mouth daily with breakfast.     metoprolol  succinate (TOPROL -XL) 100 MG 24 hr tablet Take 100 mg by mouth daily.     neomycin-bacitracin-polymyxin [phone] OINT Apply 1 Application topically daily. 30 g 1   nicotine  (NICODERM CQ  - DOSED IN MG/24 HOURS) 21 mg/24hr patch Place 1 patch (21 mg total) onto the skin daily. 28 patch 0   sodium bicarbonate  650 MG tablet Take 650 mg by mouth 2 (two) times daily.     sucralfate (CARAFATE) 1 g tablet Take 1 g by mouth 2 (two) times daily.     Vilazodone  HCl 20 MG TABS Take 20 mg by mouth daily.     No current facility-administered medications for this visit.    Review of Systems  Constitutional:  Negative for appetite change, chills, fatigue, fever and unexpected weight change.  HENT:   Negative for hearing loss and voice change.   Eyes:  Negative for eye problems and icterus.  Respiratory:  Negative for chest tightness, cough and shortness of breath.   Cardiovascular:  Negative for chest pain and leg swelling.  Gastrointestinal:  Negative for abdominal distention, abdominal pain, nausea and vomiting.  Endocrine: Negative for hot flashes.  Genitourinary:  Positive for frequency and hematuria. Negative for difficulty urinating and dysuria.        Foley catheter  Musculoskeletal:  Positive for gait problem. Negative for arthralgias.  Skin:  Negative for itching and rash.  Neurological:  Positive for gait problem. Negative for  light-headedness and numbness.  Hematological:  Negative for adenopathy. Does not bruise/bleed easily.  Psychiatric/Behavioral:  Negative for confusion.      PHYSICAL EXAMINATION: ECOG PERFORMANCE STATUS: 3 - Symptomatic, >50% confined to bed  Vitals:   08/22/23 1459 08/22/23 1512  BP: (!) 161/81 (!) 143/70  Pulse: 96   Resp: 18   Temp: (!) 97.2 F (36.2 C)   SpO2: 100%    Filed Weights   08/22/23 1459  Weight: 105 lb (47.6 kg)    Physical Exam Constitutional:      General: He is not in acute distress.    Appearance: He is not diaphoretic.     Comments: Patient sits in the wheelchair.  HENT:     Head: Normocephalic and atraumatic.  Eyes:     General: No scleral icterus. Cardiovascular:     Rate and Rhythm: Normal rate.     Heart sounds: No murmur heard. Pulmonary:     Effort: Pulmonary effort is normal. No respiratory distress.  Abdominal:  General: Bowel sounds are normal. There is no distension.     Palpations: Abdomen is soft.  Genitourinary:    Comments: )+Foley catheter with red color urine Musculoskeletal:     Cervical back: Normal range of motion and neck supple.  Skin:    General: Skin is warm and dry.     Findings: No erythema.  Neurological:     Mental Status: He is alert. Mental status is at baseline.     Cranial Nerves: No cranial nerve deficit.     Motor: No abnormal muscle tone.     Comments: Patient answers questions appropriately  Psychiatric:        Mood and Affect: Mood and affect normal.      LABORATORY DATA:  I have reviewed the data as listed    Latest Ref Rng & Units 08/22/2023    3:33 PM 08/16/2023   12:51 PM 08/11/2023    8:49 AM  CBC  WBC 4.0 - 10.5 K/uL 9.4  7.1  8.9   Hemoglobin 13.0 - 17.0 g/dL 7.6  7.6  7.4   Hematocrit 39.0 - 52.0 % 25.3  25.7  24.0   Platelets 150 - 400 K/uL 232  253  225       Latest Ref Rng & Units 08/16/2023   12:51 PM 08/11/2023    8:49 AM 08/03/2023    4:29 AM  CMP  Glucose 70 - 99 mg/dL 895   833  849   BUN 8 - 23 mg/dL 23  28  39   Creatinine 0.61 - 1.24 mg/dL 7.37  7.22  7.95   Sodium 135 - 145 mmol/L 138  136  136   Potassium 3.5 - 5.1 mmol/L 3.7  4.1  4.9   Chloride 98 - 111 mmol/L 104  105  106   CO2 22 - 32 mmol/L 24  21  21    Calcium  8.9 - 10.3 mg/dL 9.1  8.9  9.4   Total Protein 6.5 - 8.1 g/dL 7.0  7.2    Total Bilirubin 0.0 - 1.2 mg/dL 0.6  0.6    Alkaline Phos 38 - 126 U/L 65  66    AST 15 - 41 U/L 12  20    ALT 0 - 44 U/L 8  9       RADIOGRAPHIC STUDIES: I have personally reviewed the radiological images as listed and agreed with the findings in the report. CT ABDOMEN PELVIS WO CONTRAST Result Date: 08/16/2023 CLINICAL DATA:  Constant central lower abdominal pain for the past several weeks, worsening. Urinary tract infection diagnosed 1 week ago. EXAM: CT ABDOMEN AND PELVIS WITHOUT CONTRAST TECHNIQUE: Multidetector CT imaging of the abdomen and pelvis was performed following the standard protocol without IV contrast. RADIATION DOSE REDUCTION: This exam was performed according to the departmental dose-optimization program which includes automated exposure control, adjustment of the mA and/or kV according to patient size and/or use of iterative reconstruction technique. COMPARISON:  07/06/2023 FINDINGS: Lower chest: Interval pleural thickening and nodularity along the posterolateral right lower lobe in the area of previously demonstrated ground-glass opacity. The ground-glass opacity has resolved. Clear left lung base. Borderline enlarged heart. Hepatobiliary: Normal-appearing liver. Multiple gallstones are again demonstrated in the gallbladder, measuring up to 9 mm in diameter each. No gallbladder wall thickening or pericholecystic fluid. Pancreas: Stable extensive coarse pancreatic calcifications compatible with chronic pancreatitis. No evidence of acute pancreatitis. Spleen: Normal in size without focal abnormality. Adrenals/Urinary Tract: Normal-appearing adrenal glands  interval moderate dilatation  of the right renal collecting system and ureter to the level of the ureterovesical junction. No obstructing calculus visualized. Stable grossly simple appearing lower pole left renal cyst measuring 16 Hounsfield units in density on coronal image number 37/5. Unremarkable left ureter. Marked diffuse medium density wall thickening involving the urinary bladder with a Foley catheter in place is again demonstrated. There are 2 clips again demonstrated in the posterior bladder on the left. Stomach/Bowel: The rectum is distended with stool with no rectal wall thickening. Prominent stool in the sigmoid colon. Normal-appearing appendix. The stomach is unremarkable. The previously demonstrated 3.4 cm cystic mass in the region of the duodenal C-loop is smaller, currently measuring 2.2 cm in maximum diameter. Vascular/Lymphatic: Atheromatous arterial calcifications without aneurysm. Interval enlarged lymph node posterior to the urinary bladder on the right and lateral to the rectum on the right, measuring 1.3 cm in short axis diameter on image number 63/2. Reproductive: Penile prosthesis.  Normal-sized prostate gland. Other: No abdominal wall hernia or abnormality. No abdominopelvic ascites. Musculoskeletal: Minimal lumbar spine degenerative changes. Bilateral L5 pars interarticularis defects without subluxation. IMPRESSION: 1. Interval moderate hydronephrosis and hydroureter to the level of the ureterovesical junction. No obstructing calculus visualized. This is most likely due to the marked diffuse bladder wall thickening. 2. Marked diffuse medium density wall thickening involving the urinary bladder with a Foley catheter in place. This is suspicious for a bladder neoplasm. 3. Interval enlarged lymph node posterior to the urinary bladder on the right, suspicious for a metastatic node. 4. Interval pleural thickening and nodularity along the posterolateral right lower lobe in the area of previously  demonstrated ground-glass opacity. The ground-glass opacity has resolved. This is nonspecific and could be due to developing pleural and parenchymal scarring or pleural neoplasm. 5. Cholelithiasis. 6. Stable changes of chronic pancreatitis. 7. The previously demonstrated 3.4 cm cystic mass in the region of the duodenal C-loop is smaller, currently measuring 2.2 cm in maximum diameter. This suggests a pancreatic pseudocyst which has improved. 8. Prominent stool in the sigmoid colon and rectum. Electronically Signed   By: Elspeth Bathe M.D.   On: 08/16/2023 16:32   US  Venous Img Lower Bilateral (DVT) Result Date: 07/31/2023 CLINICAL DATA:  Pulmonary embolism.  Assess for residual DVT. EXAM: BILATERAL LOWER EXTREMITY VENOUS DOPPLER ULTRASOUND TECHNIQUE: Gray-scale sonography with graded compression, as well as color Doppler and duplex ultrasound were performed to evaluate the lower extremity deep venous systems from the level of the common femoral vein and including the common femoral, femoral, profunda femoral, popliteal and calf veins including the posterior tibial, peroneal and gastrocnemius veins when visible. The superficial great saphenous vein was also interrogated. Spectral Doppler was utilized to evaluate flow at rest and with distal augmentation maneuvers in the common femoral, femoral and popliteal veins. COMPARISON:  None Available. FINDINGS: RIGHT LOWER EXTREMITY Common Femoral Vein: No evidence of thrombus. Normal compressibility, respiratory phasicity and response to augmentation. Saphenofemoral Junction: No evidence of thrombus. Normal compressibility and flow on color Doppler imaging. Profunda Femoral Vein: No evidence of thrombus. Normal compressibility and flow on color Doppler imaging. Femoral Vein: No evidence of thrombus. Normal compressibility, respiratory phasicity and response to augmentation. Popliteal Vein: No evidence of thrombus. Normal compressibility, respiratory phasicity and response  to augmentation. Calf Veins: No evidence of thrombus. Normal compressibility and flow on color Doppler imaging. Superficial Great Saphenous Vein: No evidence of thrombus. Normal compressibility. Venous Reflux:  None. Other Findings:  None. LEFT LOWER EXTREMITY Common Femoral Vein: No evidence of thrombus.  Normal compressibility, respiratory phasicity and response to augmentation. Saphenofemoral Junction: No evidence of thrombus. Normal compressibility and flow on color Doppler imaging. Profunda Femoral Vein: No evidence of thrombus. Normal compressibility and flow on color Doppler imaging. Femoral Vein: Noncompressibility of the femoral vein in the distal thigh. The lumen is expanded and filled with low-level internal echoes. No evidence of color flow on color Doppler imaging. Findings suggest acute occlusive thrombus. Popliteal Vein: Acute occlusive thrombus extends through the popliteal vein. Calf Veins: Acute occlusive thrombus extends into the peroneal veins. Superficial Great Saphenous Vein: No evidence of thrombus. Normal compressibility. Venous Reflux:  None. Other Findings:  None. IMPRESSION: 1. Positive for acute appearing and occlusive thrombus within the femoral vein in the left distal thigh, the left popliteal vein and the left peroneal veins. 2. No evidence of DVT in the right lower extremity. Electronically Signed   By: Wilkie Lent M.D.   On: 07/31/2023 09:42   ECHOCARDIOGRAM COMPLETE Result Date: 07/30/2023    ECHOCARDIOGRAM REPORT   Patient Name:   Jay Flores Date of Exam: 07/30/2023 Medical Rec #:  [id number]         Height:       68.0 in Accession #:    [id number]        Weight:       111.3 lb Date of Birth:  1960-09-26          BSA:          1.593 m Patient Age:    63 years          BP:           105/61 mmHg Patient Gender: M                 HR:           75 bpm. Exam Location:  [facility] Procedure: 2D Echo, Cardiac Doppler and Color Doppler (Both Spectral and Color            Flow Doppler  were utilized during procedure). Indications:     NSTEMI I21.4                  Chest pain R07.9  History:         Patient has no prior history of Echocardiogram examinations.                  COPD; Risk Factors:Hypertension. Tobacco use.  Sonographer:     Christopher Furnace Referring Phys:  [id number] AMY N COX Diagnosing Phys: Lonni End MD  Sonographer Comments: Technically challenging study due to limited acoustic windows and no apical window. Image acquisition challenging due to COPD and Image acquisition challenging due to patient body habitus. IMPRESSIONS  1. Left ventricular ejection fraction, by estimation, is 60 to 65%. The left ventricle has normal function. Left ventricular endocardial border not optimally defined to evaluate regional wall motion. Left ventricular diastolic function could not be evaluated.  2. Right ventricular systolic function is moderately reduced. The right ventricular size is normal. There is normal pulmonary artery systolic pressure.  3. The mitral valve is normal in structure. Trivial mitral valve regurgitation.  4. The aortic valve is tricuspid. There is moderate thickening of the aortic valve. Aortic valve regurgitation is mild. Aortic valve gradient not assessed due to absent apical windows.  5. The inferior vena cava is normal in size with greater than 50% respiratory variability, suggesting right atrial pressure of 3 mmHg. FINDINGS  Left Ventricle: Left  ventricular ejection fraction, by estimation, is 60 to 65%. The left ventricle has normal function. Left ventricular endocardial border not optimally defined to evaluate regional wall motion. The left ventricular internal cavity size was normal in size. There is no left ventricular hypertrophy. Left ventricular diastolic function could not be evaluated. Right Ventricle: The right ventricular size is normal. No increase in right ventricular wall thickness. Right ventricular systolic function is moderately reduced. There is normal  pulmonary artery systolic pressure. The tricuspid regurgitant velocity is 2.10 m/s, and with an assumed right atrial pressure of 3 mmHg, the estimated right ventricular systolic pressure is 20.7 mmHg. Left Atrium: Left atrial size was not well visualized. Right Atrium: Right atrial size was not well visualized. Pericardium: There is no evidence of pericardial effusion. Mitral Valve: The mitral valve is normal in structure. Trivial mitral valve regurgitation. Tricuspid Valve: The tricuspid valve is normal in structure. Tricuspid valve regurgitation is mild. Aortic Valve: The aortic valve is tricuspid. There is moderate thickening of the aortic valve. Aortic valve regurgitation is mild. Aortic valve gradient not assessed due to absent apical windows. Pulmonic Valve: The pulmonic valve was thickened with good excursion. Pulmonic valve regurgitation is trivial. No evidence of pulmonic stenosis. Aorta: The aortic root is normal in size and structure. Venous: The inferior vena cava is normal in size with greater than 50% respiratory variability, suggesting right atrial pressure of 3 mmHg. IAS/Shunts: No atrial level shunt detected by color flow Doppler.  LEFT VENTRICLE PLAX 2D LVIDd:         3.30 cm LVIDs:         2.40 cm LV PW:         0.80 cm LV IVS:        1.00 cm LVOT diam:     2.10 cm LVOT Area:     3.46 cm  LEFT ATRIUM         Index LA diam:    2.40 cm 1.51 cm/m   AORTA Ao Root diam: 3.40 cm TRICUSPID VALVE TR Peak grad:   17.7 mmHg TR Vmax:        210.15 cm/s  SHUNTS Systemic Diam: 2.10 cm Lonni Hanson MD Electronically signed by Lonni Hanson MD Signature Date/Time: 07/30/2023/1:24:06 PM    Final    NM Pulmonary Perfusion Result Date: 07/29/2023 CLINICAL DATA:  Short of breath.  Concern for pulmonary embolism EXAM: NUCLEAR MEDICINE PERFUSION LUNG SCAN TECHNIQUE: Perfusion images were obtained in multiple projections after intravenous injection of radiopharmaceutical. RADIOPHARMACEUTICALS:  4.4 mCi Tc-27m  [facility] COMPARISON:  Chest radiograph 07/29/2018 FINDINGS: Wedge-shaped peripheral perfusion defect within the lateral RIGHT lower lobe and RIGHT middle lobe. No perfusion defects in LEFT lung. Subtle opacity in the RIGHT lung base. Perfusion defect greater than the opacity volume. Potential pulmonary infarction radiograph. IMPRESSION: Wedge-shaped peripheral perfusion defects not matched on radiograph. Findings concerning for acute pulmonary embolism the RIGHT lower lobe. Electronically Signed   By: Jackquline Boxer M.D.   On: 07/29/2023 14:14   DG Chest 2 View Result Date: 07/29/2023 CLINICAL DATA:  Chest pain EXAM: CHEST - 2 VIEW COMPARISON:  X-ray 07/06/2023. FINDINGS: No edema, pneumothorax or effusion. Normal cardiopericardial silhouette. Subtle opacity however the right lung base. Overlapping cardiac leads. IMPRESSION: Subtle opacity at the right lung base, new from previous. Please correlate for a subtle infiltrate. Short follow-up versus CT as clinically appropriate. Electronically Signed   By: Ranell Bring M.D.   On: 07/29/2023 12:33

## 2023-08-22 NOTE — Telephone Encounter (Signed)
 He missed his appointment yesterday for Foley catheter exchange and emergency room follow-up.  Would you please get him rescheduled?  He can either be on my or Sam schedule.

## 2023-08-26 ENCOUNTER — Ambulatory Visit: Payer: Self-pay | Admitting: Oncology

## 2023-08-26 LAB — PROTEIN ELECTROPHORESIS, SERUM
A/G Ratio: 0.6 — ABNORMAL LOW (ref 0.7–1.7)
Albumin ELP: 2.5 g/dL — ABNORMAL LOW (ref 2.9–4.4)
Alpha-1-Globulin: 0.4 g/dL (ref 0.0–0.4)
Alpha-2-Globulin: 1.1 g/dL — ABNORMAL HIGH (ref 0.4–1.0)
Beta Globulin: 0.8 g/dL (ref 0.7–1.3)
Gamma Globulin: 1.9 g/dL — ABNORMAL HIGH (ref 0.4–1.8)
Globulin, Total: 4.2 g/dL — ABNORMAL HIGH (ref 2.2–3.9)
Total Protein ELP: 6.7 g/dL (ref 6.0–8.5)

## 2023-08-26 NOTE — Telephone Encounter (Signed)
-----   Message from Zelphia Cap sent at 08/26/2023  1:00 PM EDT ----- Please arrange him to get IV venofer weekly x 3.  ----- Message ----- From: Rebecka, [facility] [place] Sent: 08/22/2023   3:57 PM EDT To: Zelphia Cap, MD

## 2023-08-26 NOTE — Telephone Encounter (Signed)
 Please schedule venofer weekly x3 and notify caregiver of appts. Dr. Babara discussed possibility of iron infusions at visit.

## 2023-08-27 ENCOUNTER — Ambulatory Visit (INDEPENDENT_AMBULATORY_CARE_PROVIDER_SITE_OTHER): Admitting: Physician Assistant

## 2023-08-27 DIAGNOSIS — Z466 Encounter for fitting and adjustment of urinary device: Secondary | ICD-10-CM

## 2023-08-27 NOTE — Progress Notes (Addendum)
 Cath Change/ Replacement   Patient is present today for a catheter change due to urinary retention.  8ml of water was removed from the balloon, a 18FR coude foley cath was removed without difficulty.  Patient was cleaned and prepped in a sterile fashion with betadine and 2% lidocaine  jelly was instilled into the urethra. A 18 FR coude foley cath was replaced into the bladder, no complications were noted. Urine return was not noted but catheter was hubbedr. The balloon was filled with 10ml of sterile water. A night bag was attached for drainage.  Patient tolerated well.     Performed by: Mathew Pinal RN  and Harlene Franks CMA   Additional notes: He has a recent history of recurrent gross hematuria.  CTAP with contrast dated 08/06/2023 was notable for new moderate hydroureteronephrosis to the level of the bladder without obstructing stone, diffuse bladder wall thickening, and lymphadenopathy posterior to the urinary bladder.  Last cystoscopy with Dr. Twylla late last year was benign.  Will repeat cystoscopy at this time.  He is in agreement. Samantha Vaillancourt, PA-C    Follow up: Return in about 4 week cysto

## 2023-08-29 ENCOUNTER — Ambulatory Visit
Admission: RE | Admit: 2023-08-29 | Discharge: 2023-08-29 | Disposition: A | Discharge status: Home / Self Care | Source: Ambulatory Visit | Attending: Oncology | Admitting: Oncology

## 2023-08-29 DIAGNOSIS — K862 Cyst of pancreas: Secondary | ICD-10-CM | POA: Insufficient documentation

## 2023-09-02 ENCOUNTER — Inpatient Hospital Stay

## 2023-09-02 VITALS — BP 131/65 | HR 94 | Temp 96.0°F | Resp 18

## 2023-09-02 DIAGNOSIS — D631 Anemia in chronic kidney disease: Secondary | ICD-10-CM

## 2023-09-02 DIAGNOSIS — K862 Cyst of pancreas: Secondary | ICD-10-CM | POA: Diagnosis not present

## 2023-09-02 MED ORDER — IRON SUCROSE 20 MG/ML IV SOLN
200.0000 mg | Freq: Once | INTRAVENOUS | Status: AC
  Administered 2023-09-02: 200 mg via INTRAVENOUS
  Filled 2023-09-02: qty 10

## 2023-09-02 MED ORDER — SODIUM CHLORIDE 0.9% FLUSH
10.0000 mL | Freq: Once | INTRAVENOUS | Status: AC | PRN
  Administered 2023-09-02: 10 mL
  Filled 2023-09-02: qty 10

## 2023-09-09 ENCOUNTER — Inpatient Hospital Stay

## 2023-09-11 ENCOUNTER — Other Ambulatory Visit: Payer: Self-pay

## 2023-09-11 ENCOUNTER — Inpatient Hospital Stay: Attending: Oncology

## 2023-09-11 VITALS — BP 166/85 | HR 78 | Temp 97.2°F | Resp 18

## 2023-09-11 DIAGNOSIS — N183 Chronic kidney disease, stage 3 unspecified: Secondary | ICD-10-CM | POA: Diagnosis present

## 2023-09-11 DIAGNOSIS — D631 Anemia in chronic kidney disease: Secondary | ICD-10-CM | POA: Diagnosis present

## 2023-09-11 MED ORDER — IRON SUCROSE 20 MG/ML IV SOLN
200.0000 mg | Freq: Once | INTRAVENOUS | Status: AC
  Administered 2023-09-11: 200 mg via INTRAVENOUS
  Filled 2023-09-11: qty 10

## 2023-09-16 ENCOUNTER — Inpatient Hospital Stay
Admission: EM | Admit: 2023-09-16 | Discharge: 2023-09-28 | DRG: 668 | Disposition: A | Discharge status: Home / Self Care | Source: Ambulatory Visit | Attending: Internal Medicine | Admitting: Internal Medicine

## 2023-09-16 ENCOUNTER — Inpatient Hospital Stay

## 2023-09-16 ENCOUNTER — Other Ambulatory Visit: Payer: Self-pay

## 2023-09-16 ENCOUNTER — Emergency Department

## 2023-09-16 VITALS — BP 128/73 | HR 99 | Temp 97.8°F | Resp 16

## 2023-09-16 DIAGNOSIS — E785 Hyperlipidemia, unspecified: Secondary | ICD-10-CM | POA: Diagnosis present

## 2023-09-16 DIAGNOSIS — N1832 Chronic kidney disease, stage 3b: Secondary | ICD-10-CM | POA: Diagnosis present

## 2023-09-16 DIAGNOSIS — Z720 Tobacco use: Secondary | ICD-10-CM | POA: Diagnosis not present

## 2023-09-16 DIAGNOSIS — E860 Dehydration: Secondary | ICD-10-CM | POA: Diagnosis present

## 2023-09-16 DIAGNOSIS — Z794 Long term (current) use of insulin: Secondary | ICD-10-CM | POA: Diagnosis not present

## 2023-09-16 DIAGNOSIS — E1122 Type 2 diabetes mellitus with diabetic chronic kidney disease: Secondary | ICD-10-CM | POA: Diagnosis present

## 2023-09-16 DIAGNOSIS — Z515 Encounter for palliative care: Secondary | ICD-10-CM | POA: Diagnosis not present

## 2023-09-16 DIAGNOSIS — I10 Essential (primary) hypertension: Secondary | ICD-10-CM | POA: Diagnosis not present

## 2023-09-16 DIAGNOSIS — I2699 Other pulmonary embolism without acute cor pulmonale: Secondary | ICD-10-CM | POA: Diagnosis present

## 2023-09-16 DIAGNOSIS — N3289 Other specified disorders of bladder: Secondary | ICD-10-CM | POA: Diagnosis present

## 2023-09-16 DIAGNOSIS — F17210 Nicotine dependence, cigarettes, uncomplicated: Secondary | ICD-10-CM | POA: Diagnosis present

## 2023-09-16 DIAGNOSIS — Z66 Do not resuscitate: Secondary | ICD-10-CM | POA: Diagnosis present

## 2023-09-16 DIAGNOSIS — D631 Anemia in chronic kidney disease: Secondary | ICD-10-CM | POA: Diagnosis present

## 2023-09-16 DIAGNOSIS — D62 Acute posthemorrhagic anemia: Secondary | ICD-10-CM | POA: Diagnosis present

## 2023-09-16 DIAGNOSIS — C679 Malignant neoplasm of bladder, unspecified: Secondary | ICD-10-CM | POA: Diagnosis present

## 2023-09-16 DIAGNOSIS — N184 Chronic kidney disease, stage 4 (severe): Secondary | ICD-10-CM | POA: Diagnosis not present

## 2023-09-16 DIAGNOSIS — B961 Klebsiella pneumoniae [K. pneumoniae] as the cause of diseases classified elsewhere: Secondary | ICD-10-CM | POA: Diagnosis present

## 2023-09-16 DIAGNOSIS — Z923 Personal history of irradiation: Secondary | ICD-10-CM

## 2023-09-16 DIAGNOSIS — N179 Acute kidney failure, unspecified: Secondary | ICD-10-CM | POA: Diagnosis not present

## 2023-09-16 DIAGNOSIS — E43 Unspecified severe protein-calorie malnutrition: Secondary | ICD-10-CM | POA: Diagnosis present

## 2023-09-16 DIAGNOSIS — F0280 Dementia in other diseases classified elsewhere without behavioral disturbance: Secondary | ICD-10-CM | POA: Diagnosis present

## 2023-09-16 DIAGNOSIS — I2489 Other forms of acute ischemic heart disease: Secondary | ICD-10-CM | POA: Diagnosis present

## 2023-09-16 DIAGNOSIS — E1129 Type 2 diabetes mellitus with other diabetic kidney complication: Secondary | ICD-10-CM | POA: Diagnosis present

## 2023-09-16 DIAGNOSIS — Z960 Presence of urogenital implants: Secondary | ICD-10-CM | POA: Diagnosis not present

## 2023-09-16 DIAGNOSIS — N1330 Unspecified hydronephrosis: Secondary | ICD-10-CM | POA: Diagnosis present

## 2023-09-16 DIAGNOSIS — I2510 Atherosclerotic heart disease of native coronary artery without angina pectoris: Secondary | ICD-10-CM | POA: Diagnosis present

## 2023-09-16 DIAGNOSIS — R319 Hematuria, unspecified: Secondary | ICD-10-CM | POA: Diagnosis not present

## 2023-09-16 DIAGNOSIS — N136 Pyonephrosis: Secondary | ICD-10-CM | POA: Diagnosis present

## 2023-09-16 DIAGNOSIS — B20 Human immunodeficiency virus [HIV] disease: Secondary | ICD-10-CM | POA: Diagnosis present

## 2023-09-16 DIAGNOSIS — Z7901 Long term (current) use of anticoagulants: Secondary | ICD-10-CM

## 2023-09-16 DIAGNOSIS — Z8546 Personal history of malignant neoplasm of prostate: Secondary | ICD-10-CM

## 2023-09-16 DIAGNOSIS — I82409 Acute embolism and thrombosis of unspecified deep veins of unspecified lower extremity: Secondary | ICD-10-CM | POA: Diagnosis present

## 2023-09-16 DIAGNOSIS — R310 Gross hematuria: Secondary | ICD-10-CM | POA: Diagnosis not present

## 2023-09-16 DIAGNOSIS — Z681 Body mass index (BMI) 19 or less, adult: Secondary | ICD-10-CM

## 2023-09-16 DIAGNOSIS — R7989 Other specified abnormal findings of blood chemistry: Secondary | ICD-10-CM | POA: Diagnosis present

## 2023-09-16 DIAGNOSIS — Z21 Asymptomatic human immunodeficiency virus [HIV] infection status: Secondary | ICD-10-CM | POA: Diagnosis present

## 2023-09-16 DIAGNOSIS — E872 Acidosis, unspecified: Secondary | ICD-10-CM | POA: Diagnosis present

## 2023-09-16 DIAGNOSIS — Z23 Encounter for immunization: Secondary | ICD-10-CM | POA: Diagnosis not present

## 2023-09-16 DIAGNOSIS — I129 Hypertensive chronic kidney disease with stage 1 through stage 4 chronic kidney disease, or unspecified chronic kidney disease: Principal | ICD-10-CM | POA: Diagnosis present

## 2023-09-16 DIAGNOSIS — R338 Other retention of urine: Secondary | ICD-10-CM | POA: Diagnosis not present

## 2023-09-16 DIAGNOSIS — N183 Chronic kidney disease, stage 3 unspecified: Secondary | ICD-10-CM | POA: Diagnosis not present

## 2023-09-16 DIAGNOSIS — B9689 Other specified bacterial agents as the cause of diseases classified elsewhere: Secondary | ICD-10-CM | POA: Diagnosis present

## 2023-09-16 DIAGNOSIS — Z7189 Other specified counseling: Secondary | ICD-10-CM | POA: Diagnosis not present

## 2023-09-16 DIAGNOSIS — R339 Retention of urine, unspecified: Secondary | ICD-10-CM | POA: Diagnosis present

## 2023-09-16 DIAGNOSIS — Z833 Family history of diabetes mellitus: Secondary | ICD-10-CM

## 2023-09-16 DIAGNOSIS — N139 Obstructive and reflux uropathy, unspecified: Secondary | ICD-10-CM | POA: Diagnosis not present

## 2023-09-16 DIAGNOSIS — E1140 Type 2 diabetes mellitus with diabetic neuropathy, unspecified: Secondary | ICD-10-CM | POA: Diagnosis present

## 2023-09-16 DIAGNOSIS — N401 Enlarged prostate with lower urinary tract symptoms: Secondary | ICD-10-CM | POA: Diagnosis not present

## 2023-09-16 DIAGNOSIS — R64 Cachexia: Secondary | ICD-10-CM | POA: Diagnosis present

## 2023-09-16 DIAGNOSIS — A419 Sepsis, unspecified organism: Principal | ICD-10-CM

## 2023-09-16 DIAGNOSIS — Z7984 Long term (current) use of oral hypoglycemic drugs: Secondary | ICD-10-CM

## 2023-09-16 DIAGNOSIS — N323 Diverticulum of bladder: Secondary | ICD-10-CM | POA: Diagnosis present

## 2023-09-16 DIAGNOSIS — I82402 Acute embolism and thrombosis of unspecified deep veins of left lower extremity: Secondary | ICD-10-CM | POA: Diagnosis present

## 2023-09-16 DIAGNOSIS — D509 Iron deficiency anemia, unspecified: Secondary | ICD-10-CM | POA: Insufficient documentation

## 2023-09-16 DIAGNOSIS — K8020 Calculus of gallbladder without cholecystitis without obstruction: Secondary | ICD-10-CM | POA: Diagnosis present

## 2023-09-16 DIAGNOSIS — K861 Other chronic pancreatitis: Secondary | ICD-10-CM | POA: Diagnosis present

## 2023-09-16 DIAGNOSIS — Z993 Dependence on wheelchair: Secondary | ICD-10-CM

## 2023-09-16 LAB — CBC
HCT: 23 % — ABNORMAL LOW (ref 39.0–52.0)
Hemoglobin: 7 g/dL — ABNORMAL LOW (ref 13.0–17.0)
MCH: 27.8 pg (ref 26.0–34.0)
MCHC: 30.4 g/dL (ref 30.0–36.0)
MCV: 91.3 fL (ref 80.0–100.0)
Platelets: 285 K/uL (ref 150–400)
RBC: 2.52 MIL/uL — ABNORMAL LOW (ref 4.22–5.81)
RDW: 15.9 % — ABNORMAL HIGH (ref 11.5–15.5)
WBC: 22.8 K/uL — ABNORMAL HIGH (ref 4.0–10.5)
nRBC: 0 % (ref 0.0–0.2)

## 2023-09-16 LAB — BASIC METABOLIC PANEL WITH GFR
Anion gap: 13 (ref 5–15)
BUN: 52 mg/dL — ABNORMAL HIGH (ref 8–23)
CO2: 20 mmol/L — ABNORMAL LOW (ref 22–32)
Calcium: 9.5 mg/dL (ref 8.9–10.3)
Chloride: 97 mmol/L — ABNORMAL LOW (ref 98–111)
Creatinine, Ser: 7.03 mg/dL — ABNORMAL HIGH (ref 0.61–1.24)
GFR, Estimated: 8 mL/min — ABNORMAL LOW (ref >60)
Glucose, Bld: 190 mg/dL — ABNORMAL HIGH (ref 70–99)
Potassium: 5.1 mmol/L (ref 3.5–5.1)
Sodium: 130 mmol/L — ABNORMAL LOW (ref 135–145)

## 2023-09-16 LAB — PROTIME-INR
INR: 2.5 — ABNORMAL HIGH (ref 0.8–1.2)
Prothrombin Time: 27.8 s — ABNORMAL HIGH (ref 11.4–15.2)

## 2023-09-16 LAB — TROPONIN I (HIGH SENSITIVITY)
Troponin I (High Sensitivity): 23 ng/L — ABNORMAL HIGH (ref <18)
Troponin I (High Sensitivity): 24 ng/L — ABNORMAL HIGH (ref <18)

## 2023-09-16 LAB — URINALYSIS, ROUTINE W REFLEX MICROSCOPIC
RBC / HPF: >50 RBC/hpf (ref 0–5)
WBC, UA: >50 WBC/hpf (ref 0–5)

## 2023-09-16 LAB — APTT: aPTT: 57 s — ABNORMAL HIGH (ref 24–36)

## 2023-09-16 LAB — LIPASE, BLOOD: Lipase: 21 U/L (ref 11–51)

## 2023-09-16 LAB — LACTIC ACID, PLASMA
Lactic Acid, Venous: 5.4 mmol/L (ref 0.5–1.9)
Lactic Acid, Venous: 5.3 mmol/L (ref 0.5–1.9)

## 2023-09-16 MED ORDER — OXYCODONE HCL 5 MG PO TABS
5.0000 mg | ORAL_TABLET | Freq: Four times a day (QID) | ORAL | Status: DC | PRN
  Administered 2023-09-17 – 2023-09-25 (×15): 5 mg via ORAL
  Filled 2023-09-16 (×10): qty 1

## 2023-09-16 MED ORDER — SODIUM CHLORIDE 0.9 % IR SOLN
3000.0000 mL | Status: DC
  Administered 2023-09-16 – 2023-09-19 (×17): 3000 mL

## 2023-09-16 MED ORDER — ENSURE PLUS HIGH PROTEIN PO LIQD: 237.0000 mL | Freq: Two times a day (BID) | ORAL | Status: DC

## 2023-09-16 MED ORDER — ONDANSETRON HCL 4 MG/2ML IJ SOLN: 4.0000 mg | Freq: Three times a day (TID) | INTRAMUSCULAR | Status: DC | PRN

## 2023-09-16 MED ORDER — SODIUM CHLORIDE 0.9 % IV BOLUS
1000.0000 mL | Freq: Once | INTRAVENOUS | Status: AC
  Administered 2023-09-16 (×2): 1000 mL via INTRAVENOUS

## 2023-09-16 MED ORDER — IRON SUCROSE 20 MG/ML IV SOLN
200.0000 mg | Freq: Once | INTRAVENOUS | Status: AC
  Administered 2023-09-16 (×2): 200 mg via INTRAVENOUS
  Filled 2023-09-16: qty 10

## 2023-09-16 MED ORDER — ACETAMINOPHEN 325 MG PO TABS: 650.0000 mg | ORAL_TABLET | Freq: Four times a day (QID) | ORAL | Status: DC | PRN

## 2023-09-16 MED ORDER — PNEUMOCOCCAL 20-VAL CONJ VACC 0.5 ML IM SUSY
0.5000 mL | PREFILLED_SYRINGE | INTRAMUSCULAR | Status: AC
  Administered 2023-09-17 (×2): 0.5 mL via INTRAMUSCULAR
  Filled 2023-09-16: qty 0.5

## 2023-09-16 MED ORDER — TAMSULOSIN HCL 0.4 MG PO CAPS
0.4000 mg | ORAL_CAPSULE | Freq: Every day | ORAL | Status: DC
  Administered 2023-09-16 – 2023-09-27 (×14): 0.4 mg via ORAL
  Filled 2023-09-16 (×11): qty 1

## 2023-09-16 MED ORDER — HYDRALAZINE HCL 20 MG/ML IJ SOLN: 5.0000 mg | INTRAMUSCULAR | Status: DC | PRN

## 2023-09-16 MED ORDER — INSULIN ASPART 100 UNIT/ML IJ SOLN: 0.0000 [IU] | Freq: Every day | INTRAMUSCULAR | Status: DC

## 2023-09-16 MED ORDER — INSULIN ASPART 100 UNIT/ML IJ SOLN
0.0000 [IU] | Freq: Three times a day (TID) | INTRAMUSCULAR | Status: DC
  Administered 2023-09-17: 1 [IU] via SUBCUTANEOUS
  Administered 2023-09-17 (×2): 2 [IU] via SUBCUTANEOUS
  Administered 2023-09-17 (×3): 1 [IU] via SUBCUTANEOUS
  Administered 2023-09-18 (×2): 3 [IU] via SUBCUTANEOUS
  Administered 2023-09-18: 2 [IU] via SUBCUTANEOUS
  Administered 2023-09-18: 3 [IU] via SUBCUTANEOUS
  Administered 2023-09-18: 2 [IU] via SUBCUTANEOUS
  Administered 2023-09-18: 3 [IU] via SUBCUTANEOUS
  Administered 2023-09-19 – 2023-09-20 (×2): 2 [IU] via SUBCUTANEOUS
  Administered 2023-09-20: 3 [IU] via SUBCUTANEOUS
  Administered 2023-09-20: 2 [IU] via SUBCUTANEOUS
  Administered 2023-09-21: 1 [IU] via SUBCUTANEOUS
  Administered 2023-09-21: 5 [IU] via SUBCUTANEOUS
  Administered 2023-09-22 – 2023-09-23 (×4): 2 [IU] via SUBCUTANEOUS
  Administered 2023-09-23: 5 [IU] via SUBCUTANEOUS
  Administered 2023-09-24: 1 [IU] via SUBCUTANEOUS
  Administered 2023-09-25: 3 [IU] via SUBCUTANEOUS
  Administered 2023-09-25: 1 [IU] via SUBCUTANEOUS
  Administered 2023-09-26: 2 [IU] via SUBCUTANEOUS
  Administered 2023-09-26: 7 [IU] via SUBCUTANEOUS
  Administered 2023-09-27 (×2): 1 [IU] via SUBCUTANEOUS
  Administered 2023-09-27: 5 [IU] via SUBCUTANEOUS
  Administered 2023-09-28 (×2): 1 [IU] via SUBCUTANEOUS
  Filled 2023-09-16 (×30): qty 1

## 2023-09-16 MED ORDER — MORPHINE SULFATE (PF) 2 MG/ML IV SOLN: 2.0000 mg | INTRAVENOUS | Status: DC | PRN

## 2023-09-16 MED ORDER — DM-GUAIFENESIN ER 30-600 MG PO TB12: 1.0000 | ORAL_TABLET | Freq: Two times a day (BID) | ORAL | Status: DC | PRN

## 2023-09-16 MED ORDER — METRONIDAZOLE 500 MG/100ML IV SOLN
500.0000 mg | Freq: Once | INTRAVENOUS | Status: AC
  Administered 2023-09-16 (×2): 500 mg via INTRAVENOUS
  Filled 2023-09-16: qty 100

## 2023-09-16 MED ORDER — ONDANSETRON HCL 4 MG/2ML IJ SOLN
4.0000 mg | Freq: Once | INTRAMUSCULAR | Status: AC
  Administered 2023-09-16 (×2): 4 mg via INTRAVENOUS
  Filled 2023-09-16: qty 2

## 2023-09-16 MED ORDER — SODIUM CHLORIDE 0.9 % IV BOLUS
500.0000 mL | Freq: Once | INTRAVENOUS | Status: AC
  Administered 2023-09-16 (×2): 500 mL via INTRAVENOUS

## 2023-09-16 MED ORDER — SODIUM CHLORIDE 0.9 % IV SOLN
2.0000 g | Freq: Once | INTRAVENOUS | Status: AC
  Administered 2023-09-16 (×2): 2 g via INTRAVENOUS
  Filled 2023-09-16: qty 12.5

## 2023-09-16 MED ORDER — ALBUTEROL SULFATE (2.5 MG/3ML) 0.083% IN NEBU: 2.5000 mg | INHALATION_SOLUTION | RESPIRATORY_TRACT | Status: DC | PRN

## 2023-09-16 MED ORDER — SODIUM CHLORIDE 0.9 % IV SOLN
2.0000 g | INTRAVENOUS | Status: DC
  Administered 2023-09-17 – 2023-09-19 (×5): 2 g via INTRAVENOUS
  Filled 2023-09-16 (×3): qty 20

## 2023-09-16 MED ORDER — NICOTINE 21 MG/24HR TD PT24
21.0000 mg | MEDICATED_PATCH | Freq: Every day | TRANSDERMAL | Status: DC
  Administered 2023-09-16 – 2023-09-27 (×12): 21 mg via TRANSDERMAL
  Filled 2023-09-16 (×13): qty 1

## 2023-09-16 MED ORDER — HYDROMORPHONE HCL 1 MG/ML IJ SOLN
0.5000 mg | Freq: Once | INTRAMUSCULAR | Status: AC
  Administered 2023-09-16 (×2): 0.5 mg via INTRAVENOUS
  Filled 2023-09-16: qty 0.5

## 2023-09-16 MED ORDER — LACTATED RINGERS IV SOLN: INTRAVENOUS | Status: AC

## 2023-09-16 NOTE — Sepsis Progress Note (Signed)
 Sepsis protocol monitored by eLink

## 2023-09-16 NOTE — ED Triage Notes (Signed)
 Pt comes from [facility] with c/o chest pain. Pt received his 1st does of iron  transfusion today and not long after c/o cp. Pt also stated pain in the his belly for few days. [facility] not concerned for reaction to meds but concerned for cp

## 2023-09-16 NOTE — ED Provider Notes (Signed)
 [facility] Provider Note    Event Date/Time   First MD Initiated Contact with Patient 09/16/23 1611     (approximate)   History   Chest Pain   HPI  Jay Flores is a 63 y.o. male with a history of pancreatic cancer who was given iron  transfusion today.  Tolerated iron  transfusion okay but then afterwards he was waiting for a car ride when he developed some chest pain.  He also does report having some pain in his abdomen that has been worsening.  He reports the pain in his upper abdomen has been worsening over the past couple of days.  He denies really significant pain in his chest is more of his upper abdomen.  He denies any breathing issues.  He does have a Foley catheter in place which he has noted some blood in it that he reports is new.  He does report that it is continuing to drain.  He denies any falls or hitting his head or headaches or other concerns.   Physical Exam   Triage Vital Signs: ED Triage Vitals  Encounter Vitals Group     BP 09/16/23 1418 124/79     Girls Systolic BP Percentile --      Girls Diastolic BP Percentile --      Boys Systolic BP Percentile --      Boys Diastolic BP Percentile --      Pulse Rate 09/16/23 1418 94     Resp 09/16/23 1418 17     Temp 09/16/23 1418 97.8 F (36.6 C)     Temp src --      SpO2 09/16/23 1418 100 %     Weight 09/16/23 1345 104 lb 15 oz (47.6 kg)     Height 09/16/23 1345 5' 8 (1.727 m)     Head Circumference --      Peak Flow --      Pain Score 09/16/23 1347 10     Pain Loc --      Pain Education --      Exclude from Growth Chart --     Most recent vital signs: Vitals:   09/16/23 1418  BP: 124/79  Pulse: 94  Resp: 17  Temp: 97.8 F (36.6 C)  SpO2: 100%     General: Awake, no distress.  CV:  Good peripheral perfusion.  Resp:  Normal effort.  Abd:  No distention.  Tender throughout his abdomen Other:  Foley catheter with bloody urine noted in it No swelling in legs.  No calf  tenderness  ED Results / Procedures / Treatments   Labs (all labs ordered are listed, but only abnormal results are displayed) Labs Reviewed  BASIC METABOLIC PANEL WITH GFR - Abnormal; Notable for the following components:      Result Value   Sodium 130 (*)    Chloride 97 (*)    CO2 20 (*)    Glucose, Bld 190 (*)    BUN 52 (*)    Creatinine, Ser 7.03 (*)    GFR, Estimated 8 (*)    All other components within normal limits  CBC - Abnormal; Notable for the following components:   WBC 22.8 (*)    RBC 2.52 (*)    Hemoglobin 7.0 (*)    HCT 23.0 (*)    RDW 15.9 (*)    All other components within normal limits  TROPONIN I (HIGH SENSITIVITY) - Abnormal; Notable for the following components:   Troponin I (  High Sensitivity) 23 (*)    All other components within normal limits  LIPASE, BLOOD  TROPONIN I (HIGH SENSITIVITY)     EKG  My interpretation of EKG:  Normal sinus rhythm 99 without any ST elevation or T wave inversions, normal intervals  RADIOLOGY I have reviewed the xray personally and interpreted no focal pneumonia   PROCEDURES:  Critical Care performed: Yes, see critical care procedure note(s)  .1-3 Lead EKG Interpretation  Performed by: Ernest Ronal BRAVO, MD Authorized by: Ernest Ronal BRAVO, MD     Interpretation: normal     ECG rate:  90   ECG rate assessment: normal     Rhythm: sinus rhythm     Ectopy: none     Conduction: normal   .Critical Care  Performed by: Ernest Ronal BRAVO, MD Authorized by: Ernest Ronal BRAVO, MD   Critical care provider statement:    Critical care time (minutes):  30   Critical care was necessary to treat or prevent imminent or life-threatening deterioration of the following conditions:  Sepsis   Critical care was time spent personally by me on the following activities:  Development of treatment plan with patient or surrogate, discussions with consultants, evaluation of patient's response to treatment, examination of patient, ordering and review  of laboratory studies, ordering and review of radiographic studies, ordering and performing treatments and interventions, pulse oximetry, re-evaluation of patient's condition and review of old charts    MEDICATIONS ORDERED IN ED: Medications  lactated ringers  infusion ( Intravenous New Bag/Given 09/16/23 1713)  ceFEPIme  (MAXIPIME ) 2 g in sodium chloride  0.9 % 100 mL IVPB (2 g Intravenous New Bag/Given 09/16/23 1711)  metroNIDAZOLE  (FLAGYL ) IVPB 500 mg (500 mg Intravenous New Bag/Given 09/16/23 1713)  HYDROmorphone  (DILAUDID ) injection 0.5 mg (0.5 mg Intravenous Given 09/16/23 1715)  ondansetron  (ZOFRAN ) injection 4 mg (4 mg Intravenous Given 09/16/23 1715)  sodium chloride  0.9 % bolus 1,000 mL (1,000 mLs Intravenous New Bag/Given 09/16/23 1711)     IMPRESSION / MDM / ASSESSMENT AND PLAN / ED COURSE  I reviewed the triage vital signs and the nursing notes.   Patient's presentation is most consistent with acute presentation with potential threat to life or bodily function.   Patient comes in with abdominal pain.  Some chest pain that sounds more like just abdominal pain that he is feeling as he reports that it is his upper abdomen lower chest.  Denies any breathing issues no evidence of DVT patient had labs done in triage show elevated lactate of 5.4, troponin that is elevated but flat upon repeat BMP that shows significant new creatinine elevation of 7.  Bladder scan does not show signs of retention so patient given a liter of fluid.  White count is 22,000.  Patient meets sepsis criteria therefore fluids and antibiotics were started to cover possible intra-abdominal infections.  CT imaging was ordered.  He denies any falls suggest hitting his head to suggest need a CT scan of his head.   CT imaging contrary to bladder scan actually showed urinary retention with hydronephrosis with some blood in the bladder clots and possible bladder cancer.  Did discuss this finding with patient and need for  cystoscopy he is aware.  We will replace the Foley catheter with a three-way catheter and do so manual irrigation and start on CBI.  Did discuss the case with urology Dr. Cam who is aware.  Will discuss the hospitalist for admission due to AKI secondary to urinary retention.  Hold on additional  fluids as patient has urinary retention and could make that worse.     The patient is on the cardiac monitor to evaluate for evidence of arrhythmia and/or significant heart rate changes.      FINAL CLINICAL IMPRESSION(S) / ED DIAGNOSES   Final diagnoses:  Sepsis, due to unspecified organism, unspecified whether acute organ dysfunction present (HCC)  AKI (acute kidney injury) (HCC)  Urinary retention  Hematuria, unspecified type     Rx / DC Orders   ED Discharge Orders     None        Note:  This document was prepared using Dragon voice recognition software and may include unintentional dictation errors.   Ernest Ronal BRAVO, MD 09/16/23 2140

## 2023-09-16 NOTE — Progress Notes (Signed)
 CODE SEPSIS - PHARMACY COMMUNICATION  **Broad Spectrum Antibiotics should be administered within 1 hour of Sepsis diagnosis**  Time Code Sepsis Called/Page Received: 1640  Antibiotics Ordered: Cefepime  & Flagyl   Time of 1st antibiotic administration: 1711  Additional action taken by pharmacy: N/A   Jay Flores 09/16/2023  4:40 PM

## 2023-09-16 NOTE — ED Notes (Signed)
 Attempted to call portable equipment for urology cart with no answer.

## 2023-09-16 NOTE — Patient Instructions (Signed)

## 2023-09-16 NOTE — H&P (Signed)
 History and Physical    [name] FMW:[id number] DOB: 10/26/1960 DOA: 09/16/2023  Referring MD/NP/PA:   PCP: [name], MD   Patient coming from:  The patient is coming from group home?    Chief Complaint: lower abdominal pain, hematuria  HPI: Jay Flores is a 63 y.o. male with medical history significant of hypertension, hyperlipidemia, diabetes mellitus, COPD, CKD-3B, HIV (CD 258 on 03/29/17 and VL 70 on 04/25/23), HIV dementia HCV (s/p of Harvoni), thrombocytopenia, prostate cancer, indwelling Foley catheter placement, iron  deficiency anemia, DVT/PE on Eliquis , who presents with lower abdominal pain and hematuria.  Patient is a poor historian, history is limited.  Per report, pt had iron  infusion in cancer center today.  He tolerated iron  transfusion okay, but after that he developed abdominal pain and chest pain.  His chest pain has resolved when I saw patient in ED.  He continues to have lower abdominal pain. He cannot characterize his abdominal pain in detail.  He denies nausea vomiting or diarrhea.  No SOB, cough, fever or chills. He has chronic indwelling Foley catheter in place. He has noted some blood in urine which is new per pt. He reports that his foley is continuing to drain, but CT scan in ED showed distended bladder with superior bladder wall diverticulum.  Data reviewed independently and ED Course: pt was found to have WBC 22.8, worsening renal function, troponin 23 --> 24, lactic acid of 5.4 --> 5.3.  Temperature normal, blood pressure 149/79, heart rate 109--> 90, RR 19, oxygen saturation 100% on room air.  Chest x-ray negative.  Patient is admitted to telemetry bed as inpatient.  Dr. Cam of urology is consulted.  CT-abdomen/pelvis: Well distended bladder with a superior bladder wall diverticulum. Mixed echogenicity material is noted within the bladder representing air, urine and likely hyperdense thrombus. Correlate with Foley output. The over  distention of the bladder contributes to the bilateral hydronephrosis which is similar to that seen on the prior exam. Diffuse bladder wall thickening is noted suspicious for underlying neoplasm. Correlate with clinical history.   Cholelithiasis without complicating factors.   Chronic pancreatitis with a 2 cm pancreatic pseudocyst in the region of the head stable from the prior study.    EKG: I have personally reviewed sinus rhythm, QTc 433, LVH, early R wave progression   Review of Systems:   General: no fevers, chills, no body weight gain, has fatigue HEENT: no blurry vision, hearing changes or sore throat Respiratory: no dyspnea, coughing, wheezing CV: had chest pain, no palpitations GI: no nausea, vomiting, has lower abdominal pain, no diarrhea, constipation GU: no dysuria, burning on urination, increased urinary frequency, has hematuria  Ext: no leg edema Neuro: no unilateral weakness, numbness, or tingling, no vision change or hearing loss Skin: no rash, no skin tear. MSK: No muscle spasm, no deformity, no limitation of range of movement in spin Heme: No easy bruising.  Travel history: No recent long distant travel.   Allergy: No Known Allergies  Past Medical History:  Diagnosis Date   Acute renal failure (HCC)    Altered mental status 01/20/2012   Anemia    Brain lesion    Cholelithiasis    Chronic indwelling Foley catheter    CKD (chronic kidney disease) stage 3, GFR 30-59 ml/min (HCC)    COPD (chronic obstructive pulmonary disease) (HCC)    Diabetic neuropathy (HCC)    DKA (diabetic ketoacidosis) (HCC)    DM (diabetes mellitus), type 2 (HCC)  Elevated PSA    Gait instability    uses walker   H/O urinary retention    Hepatitis C infection    s/p Harvoni   History of cocaine use    last used in 2012   HIV (human immunodeficiency virus infection) (HCC)    HIV dementia (HCC)    Hypertension    IPMN (intraductal papillary mucinous neoplasm)    Lives in  group home    [facility]   Metabolic acidosis    Pancreatic cyst    Pneumonia    Thrombocytopenia (HCC)    Tobacco use     Past Surgical History:  Procedure Laterality Date   COLONOSCOPY WITH PROPOFOL  N/A 09/05/2022   Procedure: COLONOSCOPY WITH PROPOFOL ;  Surgeon: Toledo, Ladell POUR, MD;  Location: [facility] ENDOSCOPY;  Service: Gastroenterology;  Laterality: N/A;   EUS N/A 04/19/2022   Procedure: UPPER ENDOSCOPIC ULTRASOUND (EUS) LINEAR;  Surgeon: Elta Fonda SQUIBB, MD;  Location: [facility] ENDOSCOPY;  Service: Gastroenterology;  Laterality: N/A;  Requesting 1p slot   HEMOSTASIS CLIP PLACEMENT  09/05/2022   Procedure: HEMOSTASIS CLIP PLACEMENT;  Surgeon: Aundria, Ladell POUR, MD;  Location: [facility] ENDOSCOPY;  Service: Gastroenterology;;   POLYPECTOMY  09/05/2022   Procedure: POLYPECTOMY;  Surgeon: Aundria, Ladell POUR, MD;  Location: [facility] ENDOSCOPY;  Service: Gastroenterology;;   PROSTATE BIOPSY N/A 02/26/2023   Procedure: PROSTATE BIOPSY;  Surgeon: Twylla Glendia BROCKS, MD;  Location: [facility] ORS;  Service: Urology;  Laterality: N/A;   TRANSRECTAL ULTRASOUND N/A 02/26/2023   Procedure: TRANSRECTAL ULTRASOUND;  Surgeon: Twylla Glendia BROCKS, MD;  Location: [facility] ORS;  Service: Urology;  Laterality: N/A;    Social History:  reports that he has been smoking cigarettes. He has a 34 pack-year smoking history. He has been exposed to tobacco smoke. He has never used smokeless tobacco. He reports that he does not currently use drugs after having used the following drugs: Cocaine. He reports that he does not drink alcohol.  Family History:  Family History  Problem Relation Age of Onset   Diabetes Mellitus II Mother      Prior to Admission medications   Medication Sig Start Date End Date Taking? Authorizing Provider  Accu-Chek Softclix Lancets lancets by Other route. Use as instructed    [provider]  acetaminophen  (TYLENOL  8 HOUR ARTHRITIS PAIN) 650 MG CR tablet Take 650 mg by  mouth every 4 (four) hours as needed for pain.    [provider]  amoxicillin  (AMOXIL ) 500 MG capsule Take 500 mg by mouth 3 (three) times daily. 08/15/23   [provider]  apixaban  (ELIQUIS ) 5 MG TABS tablet Take 2 tablets (10 mg total) by mouth 2 (two) times daily for 4 days, THEN 1 tablet (5 mg total) 2 (two) times daily. 08/03/23 09/06/23  Trudy Anthony HERO, MD  apixaban  (ELIQUIS ) 5 MG TABS tablet Take 1 tablet (5 mg total) by mouth 2 (two) times daily. 08/22/23   Babara Call, MD  atorvastatin  (LIPITOR) 20 MG tablet Take 20 mg by mouth at bedtime.    [provider]  cholecalciferol  (VITAMIN D3) 25 MCG (1000 UNIT) tablet Take 1,000 Units by mouth at bedtime.    [provider]  Continuous Blood Gluc Receiver (FREESTYLE LIBRE 14 DAY READER) DEVI Apply topically. 10/31/21   [provider]  Continuous Blood Gluc Sensor (FREESTYLE LIBRE 14 DAY SENSOR) MISC Apply topically. 01/17/22   [provider]  Dolutegravir -Rilpivirine  (JULUCA ) 50-25 MG TABS Take 1 tablet by mouth daily.  [provider]  finasteride  (PROSCAR ) 5 MG tablet Take 5 mg by mouth daily.    [provider]  folic acid  (FOLVITE ) 1 MG tablet Take 1 tablet (1 mg total) by mouth daily. 04/27/23   Darci Pore, MD  glucose blood (ACCU-CHEK AVIVA PLUS) test strip 1 each by Other route in the morning, at noon, and at bedtime. Use as instructed    [provider]  JARDIANCE 25 MG TABS tablet Take 25 mg by mouth in the morning. 01/14/23   [provider]  loperamide  (IMODIUM ) 2 MG capsule Take 2 mg by mouth 4 (four) times daily as needed for diarrhea or loose stools. 06/06/23   [provider]  losartan  (COZAAR ) 25 MG tablet Take 25 mg by mouth daily. 07/12/23   [provider]  metFORMIN (GLUCOPHAGE) 500 MG tablet Take 500 mg by mouth daily with breakfast. 07/03/23   [provider]  metoprolol  succinate (TOPROL -XL) 100 MG 24  hr tablet Take 100 mg by mouth daily. 09/03/20   [provider]  neomycin-bacitracin-polymyxin [phone] OINT Apply 1 Application topically daily. 03/15/23   Janit Thresa HERO, DPM  nicotine  (NICODERM CQ  - DOSED IN MG/24 HOURS) 21 mg/24hr patch Place 1 patch (21 mg total) onto the skin daily. 04/27/23   Darci Pore, MD  sodium bicarbonate  650 MG tablet Take 650 mg by mouth 2 (two) times daily.    [provider]  sucralfate (CARAFATE) 1 g tablet Take 1 g by mouth 2 (two) times daily. 07/02/23   [provider]  Vilazodone  HCl 20 MG TABS Take 20 mg by mouth daily.    [provider]    Physical Exam: Vitals:   09/16/23 2000 09/16/23 2030 09/16/23 2217 09/16/23 2327  BP: (!) 150/76 (!) 149/79 (!) 146/81 (!) 151/80  Pulse: 90  93 99  Resp: 18 16  (!) 24  Temp: 97.6 F (36.4 C)  97.8 F (36.6 C) (!) 97.5 F (36.4 C)  TempSrc: Oral   Oral  SpO2: 100%  100% 100%  Weight:      Height:       General: Not in acute distress HEENT:       Eyes: PERRL, EOMI, no jaundice       ENT: No discharge from the ears and nose.       Neck: No JVD, no bruit, no mass felt. Heme: No neck lymph node enlargement. Cardiac: S1/S2, RRR, No murmurs, No gallops or rubs. Respiratory: No rales, wheezing, rhonchi or rubs. GI: Soft, nondistended, has tenderness in suprapubic area,  no organomegaly, BS present. GU: has hematuria Ext: No pitting leg edema bilaterally. 1+DP/PT pulse bilaterally. Musculoskeletal: No joint deformities, No joint redness or warmth, no limitation of ROM in spin. Skin: No rashes.  Neuro: Alert, following command, cranial nerves II-XII grossly intact, moves all extremities Psych: Patient is not psychotic, no suicidal or hemocidal ideation.  Labs on Admission: I have personally reviewed following labs and imaging studies  CBC: Recent Labs  Lab 09/16/23 1423  WBC 22.8*  HGB 7.0*  HCT 23.0*  MCV 91.3  PLT 285   Basic Metabolic Panel: Recent  Labs  Lab 09/16/23 1423  NA 130*  K 5.1  CL 97*  CO2 20*  GLUCOSE 190*  BUN 52*  CREATININE 7.03*  CALCIUM  9.5   GFR: Estimated Creatinine Clearance: 7.3 mL/min (A) (by C-G formula based on SCr of 7.03 mg/dL (H)). Liver Function Tests: No results for input(s): AST, ALT, ALKPHOS,  BILITOT, PROT, ALBUMIN  in the last 168 hours. Recent Labs  Lab 09/16/23 1423  LIPASE 21   No results for input(s): AMMONIA in the last 168 hours. Coagulation Profile: Recent Labs  Lab 09/16/23 2308  INR 2.5*   Cardiac Enzymes: No results for input(s): CKTOTAL, CKMB, CKMBINDEX, TROPONINI in the last 168 hours. BNP (last 3 results) No results for input(s): PROBNP in the last 8760 hours. HbA1C: No results for input(s): HGBA1C in the last 72 hours. CBG: Recent Labs  Lab 09/17/23 0009  GLUCAP 129*   Lipid Profile: No results for input(s): CHOL, HDL, LDLCALC, TRIG, CHOLHDL, LDLDIRECT in the last 72 hours. Thyroid Function Tests: No results for input(s): TSH, T4TOTAL, FREET4, T3FREE, THYROIDAB in the last 72 hours. Anemia Panel: No results for input(s): VITAMINB12, FOLATE, FERRITIN, TIBC, IRON , RETICCTPCT in the last 72 hours. Urine analysis:    Component Value Date/Time   COLORURINE RED (A) 09/16/2023 2219   APPEARANCEUR CLOUDY (A) 09/16/2023 2219   APPEARANCEUR Cloudy (A) 02/11/2023 1123   LABSPEC  09/16/2023 2219    TEST NOT REPORTED DUE TO COLOR INTERFERENCE OF URINE PIGMENT   PHURINE  09/16/2023 2219    TEST NOT REPORTED DUE TO COLOR INTERFERENCE OF URINE PIGMENT   GLUCOSEU (A) 09/16/2023 2219    TEST NOT REPORTED DUE TO COLOR INTERFERENCE OF URINE PIGMENT   HGBUR (A) 09/16/2023 2219    TEST NOT REPORTED DUE TO COLOR INTERFERENCE OF URINE PIGMENT   BILIRUBINUR (A) 09/16/2023 2219    TEST NOT REPORTED DUE TO COLOR INTERFERENCE OF URINE PIGMENT   BILIRUBINUR Negative 02/11/2023 1123   KETONESUR (A) 09/16/2023 2219    TEST  NOT REPORTED DUE TO COLOR INTERFERENCE OF URINE PIGMENT   PROTEINUR (A) 09/16/2023 2219    TEST NOT REPORTED DUE TO COLOR INTERFERENCE OF URINE PIGMENT   UROBILINOGEN 1.0 01/23/2012 0602   NITRITE (A) 09/16/2023 2219    TEST NOT REPORTED DUE TO COLOR INTERFERENCE OF URINE PIGMENT   LEUKOCYTESUR (A) 09/16/2023 2219    TEST NOT REPORTED DUE TO COLOR INTERFERENCE OF URINE PIGMENT   Sepsis Labs: @LABRCNTIP (procalcitonin:4,lacticidven:4) )No results found for this or any previous visit (from the past 240 hours).   Radiological Exams on Admission:   Assessment/Plan Principal Problem:   Acute kidney injury superimposed on stage 3b chronic kidney disease (HCC) Active Problems:   Bilateral hydronephrosis   Acute urinary retention   Bladder wall thickening   DVT (deep venous thrombosis)_left leg   Pulmonary embolism (HCC)   CAD (coronary artery disease)   HTN (hypertension)   Type II diabetes mellitus with renal manifestations (HCC)   HLD (hyperlipidemia)   HIV (human immunodeficiency virus infection) (HCC)   Elevated lactic acid level   Anemia in chronic kidney disease (CKD)   Tobacco use   Protein-calorie malnutrition, severe   Assessment and Plan:  Acute kidney injury superimposed on stage 3b chronic kidney disease due to bilateral hydronephrosis and acute urinary retention 2/2 to possible bladder thrombus: Baseline creatinine 2.62 on 08/16/2023.  His creatinine is 7.03, BUN 52, GFR 8 today.  Patient has WBC of 22.8, empiric antibiotics for started in ED.  Consulted Dr. Cam of urology.  -Admit to telemetry bed as inpatient - Temporarily hold Eliquis  -  IVF: 1L NS, then 75 cc/h - start continuous bladder irrigation - As needed oxycodone  and Tylenol  for pain - Cefepime  and Flagyl  were started in ED empirically, will change to Rocephin  and follow-up urinalysis and urine culture. - Continue Proscar   and start Flomax   Bladder wall thickening: CT showed diffuse bladder wall  thickening, suspicious for underlying neoplasm? -f/u urologist recommendation  DVT (deep venous thrombosis)_left leg and pulmonary embolism (HCC) -Temporarily hold Eliquis   CAD (coronary artery disease): Patient had some chest pain earlier which has resolved.  Troponin 23 --> 26, possibly due to demand ischemia. - Continue Lipitor  HTN (hypertension) -IV hydralazine  as needed - Hold Cozaar  due to worsening renal function  Type II diabetes mellitus with renal manifestations [facility]): Recent A1c 6.3, well-controlled.  Patient is taking Jardiance and metformin -SSI  HLD (hyperlipidemia) -Lipitor  HIV (human immunodeficiency virus infection) (HCC) -Juluca   Elevated lactic acid level: Lactic acid 5.4  --> 5.3.  Patient has leukocytosis with WBC 22.8, but no fever, fever.  Clinically does not seem to have sepsis.  Likely due to dehydration. - IV fluid as above - Trend lactic acid level  Anemia in chronic kidney disease (CKD): Hemoglobin 7.0 (7.6 on 08/22/2023) - Hold Eliquis  - Follow-up with CBC in AM - Type and screen  Tobacco use -Nicotine  patch  Protein-calorie malnutrition, severe: Body weight 47.6 kg, BMI 15.96 - Ensure - Nutrition consult         DVT ppx: SCD only to right leg (pt has left leg DVT)  Code Status: Full code   Family Communication:     not done, no family member is at bed side.     Disposition Plan:  Anticipate discharge back to previous environment, group home?   Consults called:  Dr. Cam of urology is consulted.  Admission status and Level of care: Telemetry Medical:    for obs as inpt        Dispo: The patient is from: Group home              Anticipated d/c is to: Group home              Anticipated d/c date is: 2 days              Patient currently is not medically stable to d/c.    Severity of Illness:  The appropriate patient status for this patient is INPATIENT. Inpatient status is judged to be reasonable and necessary in order  to provide the required intensity of service to ensure the patient's safety. The patient's presenting symptoms, physical exam findings, and initial radiographic and laboratory data in the context of their chronic comorbidities is felt to place them at high risk for further clinical deterioration. Furthermore, it is not anticipated that the patient will be medically stable for discharge from the hospital within 2 midnights of admission.   * I certify that at the point of admission it is my clinical judgment that the patient will require inpatient hospital care spanning beyond 2 midnights from the point of admission due to high intensity of service, high risk for further deterioration and high frequency of surveillance required.*       Date of Service 09/17/2023    Caleb Exon [facility]   If 7PM-7AM, please contact night-coverage [email or web address] 09/17/2023, 1:52 AM

## 2023-09-16 NOTE — ED Notes (Signed)
 ED TO INPATIENT HANDOFF REPORT  ED Nurse Name and Phone #: Guyla Bless  S Name/Age/Gender Jay Flores 63 y.o. male Room/Bed: ED19A/ED19A  Code Status   Code Status: Full Code  Home/SNF/Other Home Patient oriented to: self, place, time, and situation Is this baseline? Yes   Triage Complete: Triage complete  Chief Complaint Acute kidney injury superimposed on stage 3b chronic kidney disease (HCC) [N17.9, N18.32] Acute renal failure superimposed on stage 3b chronic kidney disease (HCC) [N17.9, N18.32]  Triage Note Pt comes from [facility] with c/o chest pain. Pt received his 1st does of iron  transfusion today and not long after c/o cp. Pt also stated pain in the his belly for few days. [facility] not concerned for reaction to meds but concerned for cp   See first nurse note   Allergies No Known Allergies  Level of Care/Admitting Diagnosis ED Disposition     ED Disposition  Admit   Condition  --   Comment  Hospital Area: [facility] [[id number]]  Level of Care: Telemetry Medical [[id number]]  Covid Evaluation: Asymptomatic - no recent exposure (last 10 days) testing not required  Diagnosis: Acute renal failure superimposed on stage 3b chronic kidney disease [facility]) [[id number]]  Admitting Physician: NIU, XILIN [[id number]]  Attending Physician: NIU, XILIN [phone]  Certification:: I certify this patient will need inpatient services for at least 2 midnights  Expected Medical Readiness: 09/18/2023          B Medical/Surgery History Past Medical History:  Diagnosis Date   Acute renal failure (HCC)    Altered mental status 01/20/2012   Anemia    Brain lesion    Cholelithiasis    Chronic indwelling Foley catheter    CKD (chronic kidney disease) stage 3, GFR 30-59 ml/min (HCC)    COPD (chronic obstructive pulmonary disease) (HCC)    Diabetic neuropathy (HCC)    DKA (diabetic ketoacidosis) (HCC)    DM (diabetes mellitus), type 2 (HCC)    Elevated PSA     Gait instability    uses walker   H/O urinary retention    Hepatitis C infection    s/p Harvoni   History of cocaine use    last used in 2012   HIV (human immunodeficiency virus infection) (HCC)    HIV dementia (HCC)    Hypertension    IPMN (intraductal papillary mucinous neoplasm)    Lives in group home    [facility]   Metabolic acidosis    Pancreatic cyst    Pneumonia    Thrombocytopenia (HCC)    Tobacco use    Past Surgical History:  Procedure Laterality Date   COLONOSCOPY WITH PROPOFOL  N/A 09/05/2022   Procedure: COLONOSCOPY WITH PROPOFOL ;  Surgeon: Toledo, Ladell POUR, MD;  Location: [facility] ENDOSCOPY;  Service: Gastroenterology;  Laterality: N/A;   EUS N/A 04/19/2022   Procedure: UPPER ENDOSCOPIC ULTRASOUND (EUS) LINEAR;  Surgeon: Elta Fonda SQUIBB, MD;  Location: [facility] ENDOSCOPY;  Service: Gastroenterology;  Laterality: N/A;  Requesting 1p slot   HEMOSTASIS CLIP PLACEMENT  09/05/2022   Procedure: HEMOSTASIS CLIP PLACEMENT;  Surgeon: Aundria, Ladell POUR, MD;  Location: [facility] ENDOSCOPY;  Service: Gastroenterology;;   POLYPECTOMY  09/05/2022   Procedure: POLYPECTOMY;  Surgeon: Aundria, Ladell POUR, MD;  Location: [facility] ENDOSCOPY;  Service: Gastroenterology;;   PROSTATE BIOPSY N/A 02/26/2023   Procedure: PROSTATE BIOPSY;  Surgeon: Twylla Glendia BROCKS, MD;  Location: [facility] ORS;  Service: Urology;  Laterality: N/A;   TRANSRECTAL ULTRASOUND N/A  02/26/2023   Procedure: TRANSRECTAL ULTRASOUND;  Surgeon: Twylla Glendia BROCKS, MD;  Location: [facility] ORS;  Service: Urology;  Laterality: N/A;     A IV Location/Drains/Wounds Patient Lines/Drains/Airways Status     Active Line/Drains/Airways     Name Placement date Placement time Site Days   Peripheral IV 09/16/23 20 G Anterior;Left;Proximal Forearm 09/16/23  1711  Forearm  less than 1   Peripheral IV 09/16/23 20 G Anterior;Proximal;Right Forearm 09/16/23  1712  Forearm  less than 1   Urethral Catheter Jessie RN Non-latex 18  Fr. 08/11/23  1112  Non-latex  36            Intake/Output Last 24 hours No intake or output data in the 24 hours ending 09/16/23 2130  Labs/Imaging Results for orders placed or performed during the hospital encounter of 09/16/23 (from the past 48 hours)  Basic metabolic panel     Status: Abnormal   Collection Time: 09/16/23  2:23 PM  Result Value Ref Range   Sodium 130 (L) 135 - 145 mmol/L   Potassium 5.1 3.5 - 5.1 mmol/L   Chloride 97 (L) 98 - 111 mmol/L   CO2 20 (L) 22 - 32 mmol/L   Glucose, Bld 190 (H) 70 - 99 mg/dL    Comment: Glucose reference range applies only to samples taken after fasting for at least 8 hours.   BUN 52 (H) 8 - 23 mg/dL   Creatinine, Ser 2.96 (H) 0.61 - 1.24 mg/dL   Calcium  9.5 8.9 - 10.3 mg/dL   GFR, Estimated 8 (L) >60 mL/min    Comment: (NOTE) Calculated using the CKD-EPI Creatinine Equation (2021)    Anion gap 13 5 - 15    Comment: Performed at [facility], [address] [place], [place] [address]  CBC     Status: Abnormal   Collection Time: 09/16/23  2:23 PM  Result Value Ref Range   WBC 22.8 (H) 4.0 - 10.5 K/uL   RBC 2.52 (L) 4.22 - 5.81 MIL/uL   Hemoglobin 7.0 (L) 13.0 - 17.0 g/dL   HCT 76.9 (L) 60.9 - 47.9 %   MCV 91.3 80.0 - 100.0 fL   MCH 27.8 26.0 - 34.0 pg   MCHC 30.4 30.0 - 36.0 g/dL   RDW 84.0 (H) 88.4 - 84.4 %   Platelets 285 150 - 400 K/uL   nRBC 0.0 0.0 - 0.2 %    Comment: Performed at [facility], [address] [place], [place] [address]  Troponin I (High Sensitivity)     Status: Abnormal   Collection Time: 09/16/23  2:23 PM  Result Value Ref Range   Troponin I (High Sensitivity) 23 (H) <18 ng/L    Comment: (NOTE) Elevated high sensitivity troponin I (hsTnI) values and significant  changes across serial measurements may suggest ACS but many other  chronic and acute conditions are known to elevate hsTnI results.  Refer to the Links section for chest pain algorithms and additional   guidance. Performed at [facility], [address] [place], [place] [address]   Lipase, blood     Status: None   Collection Time: 09/16/23  2:23 PM  Result Value Ref Range   Lipase 21 11 - 51 U/L    Comment: Performed at [facility], [address] [place], [place] [address]  Troponin I (High Sensitivity)     Status: Abnormal   Collection Time: 09/16/23  4:32 PM  Result Value Ref Range   Troponin  I (High Sensitivity) 24 (H) <18 ng/L    Comment: (NOTE) Elevated high sensitivity troponin I (hsTnI) values and significant  changes across serial measurements may suggest ACS but many other  chronic and acute conditions are known to elevate hsTnI results.  Refer to the Links section for chest pain algorithms and additional  guidance. Performed at [facility], [address] [place], [place] [address]   Lactic acid, plasma     Status: Abnormal   Collection Time: 09/16/23  5:09 PM  Result Value Ref Range   Lactic Acid, Venous 5.4 (HH) 0.5 - 1.9 mmol/L    Comment: CRITICAL RESULT CALLED TO, READ BACK BY AND VERIFIED WITH KELLY WILLIAMS @1800  ON 09/16/23 SKL Performed at [facility], [address] [place], [place] [address]   Lactic acid, plasma     Status: Abnormal   Collection Time: 09/16/23  6:40 PM  Result Value Ref Range   Lactic Acid, Venous 5.3 (HH) 0.5 - 1.9 mmol/L    Comment: CRITICAL VALUE NOTED. VALUE IS CONSISTENT WITH PREVIOUSLY REPORTED/CALLED VALUE SKL Performed at [facility], [address] [place], [place] [address]    CT ABDOMEN PELVIS WO CONTRAST Result Date: 09/16/2023 CLINICAL DATA:  Chest and abdominal pain EXAM: CT ABDOMEN AND PELVIS WITHOUT CONTRAST TECHNIQUE: Multidetector CT imaging of the abdomen and pelvis was performed following the standard protocol without IV contrast. RADIATION DOSE REDUCTION: This exam was performed according to the departmental dose-optimization program which includes  automated exposure control, adjustment of the mA and/or kV according to patient size and/or use of iterative reconstruction technique. COMPARISON:  08/16/2023 FINDINGS: Lower chest: Mild scarring is noted in the right lung base. Hepatobiliary: Cholelithiasis is seen without complicating factors. The liver appears within normal limits. Pancreas: Scattered calcifications are noted throughout the pancreas consistent with chronic pancreatitis. The previously seen cystic change adjacent to the head of the pancreas and second portion of the duodenum is again noted and stable measuring approximately 2 cm. The more peripheral pancreas appears within normal limits. Spleen: Normal in size without focal abnormality. Adrenals/Urinary Tract: Adrenal glands are within normal limits. Persistent hydronephrosis and hydroureter is noted bilateral likely related to an over distended bladder. Foley catheter is noted in place there is considerable mixed attenuation density within the bladder lumen consistent with air, urine and likely thrombus. Persistent bladder wall thickening is again noted with evidence of a superior bladder diverticulum. These changes likely contribute to the degree of hydronephrosis. Stomach/Bowel: Scattered fecal material is noted throughout the colon. No obstructive or inflammatory changes of the colon are seen. The appendix is partially visualized and within normal limits. Small bowel and stomach appear within normal limits. Vascular/Lymphatic: Aortic atherosclerosis. No enlarged abdominal or pelvic lymph nodes. Previously seen lymph node adjacent to the rectum is no longer identified. Reproductive: Prostate is unremarkable. Other: No abdominal wall hernia or abnormality. No abdominopelvic ascites. Musculoskeletal: Bilateral L5 pars defects are noted without significant anterolisthesis. No acute bony abnormality is noted. 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 Mixed  echogenicity material is noted within the bladder representing air, urine and likely hyperdense thrombus. Correlate with Foley output. The over distention of the bladder contributes to the bilateral hydronephrosis which is similar to that seen on the prior exam. Diffuse bladder wall thickening is noted suspicious for underlying neoplasm. Correlate with clinical history. Cholelithiasis without complicating factors. Chronic pancreatitis with a 2 cm pancreatic pseudocyst in the region of the head stable from the  prior study. Electronically Signed   By: Oneil Devonshire M.D.   On: 09/16/2023 20:25   DG Chest 1 View Result Date: 09/16/2023 CLINICAL DATA:  Chest pain. EXAM: CHEST  1 VIEW COMPARISON:  July 29, 2023 FINDINGS: The heart size and mediastinal contours are within normal limits. Mild, stable linear scarring and/or atelectasis is seen along the periphery of the right lung base. No acute infiltrate, pleural effusion or pneumothorax is identified. The visualized skeletal structures are unremarkable. IMPRESSION: Mild, stable right basilar linear scarring and/or atelectasis. Electronically Signed   By: Suzen Dials M.D.   On: 09/16/2023 14:51    Pending Labs Unresulted Labs (From admission, onward)     Start     Ordered   09/17/23 0500  Basic metabolic panel  Tomorrow morning,   R        09/16/23 2128   09/17/23 0500  CBC  Tomorrow morning,   R        09/16/23 2128   09/16/23 2129  Type and screen [facility]  Once,   R       Comments: [facility]    09/16/23 2128   09/16/23 2128  APTT  Add-on,   AD        09/16/23 2128   09/16/23 2128  Protime-INR  Add-on,   AD        09/16/23 2128   09/16/23 2112  Urine Culture  Once,   URGENT       Question:  Indication  Answer:  Dysuria   09/16/23 2111   09/16/23 2111  Urinalysis, Routine w reflex microscopic -Urine, Clean Catch  Once,   URGENT       Question:  Specimen Source  Answer:  Urine, Clean Catch    09/16/23 2111   09/16/23 1640  Blood culture (routine x 2)  BLOOD CULTURE X 2,   STAT      09/16/23 1639            Vitals/Pain Today's Vitals   09/16/23 1930 09/16/23 2000 09/16/23 2003 09/16/23 2030  BP: (!) 147/79 (!) 150/76  (!) 149/79  Pulse:  90    Resp: 19 18  16   Temp:  97.6 F (36.4 C)    TempSrc:  Oral    SpO2:  100%    Weight:      Height:      PainSc:   2      Isolation Precautions No active isolations  Medications Medications  lactated ringers  infusion ( Intravenous Rate/Dose Change 09/16/23 2128)  sodium chloride  irrigation 0.9 % 3,000 mL (has no administration in time range)  morphine  (PF) 2 MG/ML injection 2 mg (has no administration in time range)  oxyCODONE  (Oxy IR/ROXICODONE ) immediate release tablet 5 mg (has no administration in time range)  albuterol  (VENTOLIN  HFA) 108 (90 Base) MCG/ACT inhaler 2 puff (has no administration in time range)  dextromethorphan-guaiFENesin  (MUCINEX  DM) 30-600 MG per 12 hr tablet 1 tablet (has no administration in time range)  ondansetron  (ZOFRAN ) injection 4 mg (has no administration in time range)  hydrALAZINE  (APRESOLINE ) injection 5 mg (has no administration in time range)  acetaminophen  (TYLENOL ) tablet 650 mg (has no administration in time range)  insulin  aspart (novoLOG ) injection 0-9 Units (has no administration in time range)  insulin  aspart (novoLOG ) injection 0-5 Units (has no administration in time range)  nicotine  (NICODERM CQ  - dosed in mg/24 hours) patch 21 mg (has no administration in time range)  ceFEPIme  (MAXIPIME ) 2 g in sodium chloride  0.9 % 100 mL IVPB (0 g Intravenous Stopped 09/16/23 1840)  metroNIDAZOLE  (FLAGYL ) IVPB 500 mg (0 mg Intravenous Stopped 09/16/23 1840)  HYDROmorphone  (DILAUDID ) injection 0.5 mg (0.5 mg Intravenous Given 09/16/23 1715)  ondansetron  (ZOFRAN ) injection 4 mg (4 mg Intravenous Given 09/16/23 1715)  sodium chloride  0.9 % bolus 1,000 mL (0 mLs Intravenous Stopped 09/16/23 1840)   sodium chloride  0.9 % bolus 500 mL (0 mLs Intravenous Stopped 09/16/23 1840)    Mobility manual wheelchair   R Recommendations: See Admitting Provider Note  Report given to:

## 2023-09-16 NOTE — Progress Notes (Signed)
 Pt had 3rd iron  infusion, no issues, pt stayed his 30 minutes post infusion, taken downstairs via w/c, Once pt was downstairs awaiting ride, he c/o chest and abdominal pain, BP 141/74, O2 sats 100% RA, hr 97, messaged MD, she request pt go to er for chest pain evaluation (pt taken to ER via w/c as ordered).

## 2023-09-16 NOTE — ED Notes (Signed)
 Ordered urology cart from portable.

## 2023-09-16 NOTE — ED Notes (Signed)
 Urology at bedside.

## 2023-09-16 NOTE — Consult Note (Signed)
 I have been asked to see the patient by Dr. Ronal Lewandowsky, MD, for evaluation and management of clot retention, progressive hydronephrosis and acute renal failure.  History of present illness: 63 year old comorbid man presented to the emergency department from hematology clinic for acute onset chest pain.  The patient was undergoing iron  infusions for chronic anemia.  In the emergency department the patient was found to have acute on chronic renal failure.  He also appeared to have an occluded Foley catheter.  CT scan was performed demonstrating a very large distended bladder with an anterior wall bladder diverticulum and progressive or worsening bilateral hydroureteronephrosis.  The patient's urologic history is significant for her prostate cancer and is status post external beam radiation, completed in June 2025.  He also has chronic urinary retention managed with a Foley catheter.  Patient was first noted to be in urinary retention in August 2022.  He has his catheter exchanged every 4 weeks in the urology clinic.  He had a cystoscopic evaluation in December 2024 that was largely unremarkable, but visualization was slightly limited.  He was first noted to have gross hematuria in April 2025.  Noncon CT scan of the abdomen and pelvis in May 2025 demonstrated severely thickened anterior bladder wall.  In the emergency department his 46 French catheter was removed and exchanged for a 22 [place] three-way Foley catheter.  We hand irrigated him with about a liter and a half of saline and evacuated significant amount of clot.  We then placed him on CBI.  He was noted to be feeling significantly better.  Review of systems: A 12 point comprehensive review of systems was obtained and is negative unless otherwise stated in the history of present illness.  Patient Active Problem List   Diagnosis Date Noted   Acute kidney injury superimposed on stage 3b chronic kidney disease (HCC) 09/16/2023   Bilateral  hydronephrosis 09/16/2023   Acute urinary retention 09/16/2023   CAD (coronary artery disease) 09/16/2023   DVT (deep venous thrombosis)_left leg 09/16/2023   Pulmonary embolism (HCC) 09/16/2023   Iron  deficiency anemia 09/16/2023   Bladder wall thickening 09/16/2023   Bladder mass 08/22/2023   Protein-calorie malnutrition, severe 08/01/2023   NSTEMI (non-ST elevated myocardial infarction) (HCC) 07/29/2023   Diabetes mellitus type 2, noninsulin dependent (HCC) 07/29/2023   CKD (chronic kidney disease) stage 4, GFR 15-29 ml/min (HCC) 07/29/2023   Leukocytosis 07/29/2023   Right pulmonary infiltrate on CXR 07/29/2023   Community acquired pneumonia 07/06/2023   Chest discomfort 07/06/2023   Depression 07/06/2023   Anemia in chronic kidney disease (CKD) 04/24/2023   HLD (hyperlipidemia) 04/24/2023   Syncope 04/24/2023   Type II diabetes mellitus with renal manifestations (HCC) 04/24/2023   Prostate cancer (HCC) 04/24/2023   Elevated lactic acid level 04/24/2023   Pancreatic cyst 03/23/2022   Callus 11/23/2021   Pain due to onychomycosis of toenails of both feet 06/15/2021   Diabetic neuropathy (HCC) 06/15/2021   Bladder dysfunction 05/07/2021   H/O urinary retention 05/07/2021   Tobacco use 04/17/2018   DKA (diabetic ketoacidosis) (HCC) 03/29/2017   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    Current Outpatient Medications on File Prior to Encounter  Medication Sig Dispense Refill   Accu-Chek Softclix Lancets lancets by Other route. Use as instructed     acetaminophen  (TYLENOL  8 HOUR ARTHRITIS PAIN) 650 MG CR tablet Take 650 mg by mouth every 4 (four) hours as needed for pain.     amoxicillin  (AMOXIL ) 500 MG capsule Take 500 mg by mouth 3 (three) times daily.     apixaban  (ELIQUIS ) 5 MG TABS tablet Take 2 tablets (10 mg total) by mouth 2 (two) times daily for 4 days, THEN 1 tablet (5 mg total) 2 (two) times daily. 60 tablet 0   apixaban  (ELIQUIS ) 5 MG TABS tablet Take 1 tablet (5 mg total) by mouth 2 (two) times daily. 60 tablet 3   atorvastatin  (LIPITOR) 20 MG tablet Take 20 mg by mouth at bedtime.     cholecalciferol  (VITAMIN D3) 25 MCG (1000 UNIT) tablet Take 1,000 Units by mouth at bedtime.     Continuous Blood Gluc Receiver (FREESTYLE LIBRE 14 DAY READER) DEVI Apply topically.     Continuous Blood Gluc Sensor (FREESTYLE LIBRE 14 DAY SENSOR) MISC Apply topically.     Dolutegravir -Rilpivirine  (JULUCA ) 50-25 MG TABS Take 1 tablet by mouth daily.     finasteride  (PROSCAR ) 5 MG tablet Take 5 mg by mouth daily.     folic acid  (FOLVITE ) 1 MG tablet Take 1 tablet (1 mg total) by mouth daily. 90 tablet 1   glucose blood (ACCU-CHEK AVIVA PLUS) test strip 1 each by Other route in the morning, at noon, and at bedtime. Use as instructed     JARDIANCE 25 MG TABS tablet Take 25 mg by mouth in the morning.     loperamide  (IMODIUM ) 2 MG capsule Take 2 mg by mouth 4 (four) times daily as needed for diarrhea or loose stools.     losartan  (COZAAR ) 25 MG tablet Take 25 mg by mouth daily.     metFORMIN (GLUCOPHAGE) 500 MG tablet Take 500 mg by mouth daily with breakfast.     metoprolol  succinate (TOPROL -XL) 100 MG 24 hr tablet Take 100 mg by mouth daily.     neomycin-bacitracin-polymyxin [id number] OINT Apply 1 Application topically daily. 30 g 1   nicotine  (NICODERM CQ  - DOSED IN MG/24 HOURS)  21 mg/24hr patch Place 1 patch (21 mg total) onto the skin daily. 28 patch 0   sodium bicarbonate  650 MG tablet Take 650 mg by mouth 2 (two) times daily.     sucralfate (CARAFATE) 1 g tablet Take 1 g by mouth 2 (two) times daily.     Vilazodone  HCl 20 MG TABS Take 20 mg by mouth daily.      Past Medical History:  Diagnosis Date   Acute renal failure (HCC)    Altered mental status 01/20/2012   Anemia    Brain lesion    Cholelithiasis    Chronic indwelling Foley catheter    CKD (chronic kidney disease) stage 3, GFR 30-59 ml/min (HCC)    COPD (chronic obstructive pulmonary disease) (HCC)    Diabetic neuropathy (HCC)    DKA (diabetic ketoacidosis) (HCC)    DM (diabetes mellitus), type 2 (HCC)    Elevated PSA    Gait instability    uses walker   H/O urinary retention  Hepatitis C infection    s/p Harvoni   History of cocaine use    last used in 2012   HIV (human immunodeficiency virus infection) (HCC)    HIV dementia (HCC)    Hypertension    IPMN (intraductal papillary mucinous neoplasm)    Lives in group home    [facility]   Metabolic acidosis    Pancreatic cyst    Pneumonia    Thrombocytopenia (HCC)    Tobacco use     Past Surgical History:  Procedure Laterality Date   COLONOSCOPY WITH PROPOFOL  N/A 09/05/2022   Procedure: COLONOSCOPY WITH PROPOFOL ;  Surgeon: Toledo, Ladell POUR, MD;  Location: [facility] ENDOSCOPY;  Service: Gastroenterology;  Laterality: N/A;   EUS N/A 04/19/2022   Procedure: UPPER ENDOSCOPIC ULTRASOUND (EUS) LINEAR;  Surgeon: Elta Fonda SQUIBB, MD;  Location: [facility] ENDOSCOPY;  Service: Gastroenterology;  Laterality: N/A;  Requesting 1p slot   HEMOSTASIS CLIP PLACEMENT  09/05/2022   Procedure: HEMOSTASIS CLIP PLACEMENT;  Surgeon: Aundria, Ladell POUR, MD;  Location: [facility] ENDOSCOPY;  Service: Gastroenterology;;   POLYPECTOMY  09/05/2022   Procedure: POLYPECTOMY;  Surgeon: Aundria, Ladell POUR, MD;  Location: [facility] ENDOSCOPY;  Service:  Gastroenterology;;   PROSTATE BIOPSY N/A 02/26/2023   Procedure: PROSTATE BIOPSY;  Surgeon: Twylla Glendia BROCKS, MD;  Location: [facility] ORS;  Service: Urology;  Laterality: N/A;   TRANSRECTAL ULTRASOUND N/A 02/26/2023   Procedure: TRANSRECTAL ULTRASOUND;  Surgeon: Twylla Glendia BROCKS, MD;  Location: [facility] ORS;  Service: Urology;  Laterality: N/A;    Social History   Tobacco Use   Smoking status: Every Day    Current packs/day: 1.00    Average packs/day: 1 pack/day for 34.0 years (34.0 ttl pk-yrs)    Types: Cigarettes    Passive exposure: Current   Smokeless tobacco: Never  Vaping Use   Vaping status: Never Used  Substance Use Topics   Alcohol use: No   Drug use: Not Currently    Types: Cocaine    Comment: 01/22/2012 last used cocaine ~ 2012    Family History  Problem Relation Age of Onset   Diabetes Mellitus II Mother     PE: Vitals:   09/16/23 1930 09/16/23 2000 09/16/23 2030 09/16/23 2217  BP: (!) 147/79 (!) 150/76 (!) 149/79 (!) 146/81  Pulse:  90  93  Resp: 19 18 16    Temp:  97.6 F (36.4 C)  97.8 F (36.6 C)  TempSrc:  Oral    SpO2:  100%  100%  Weight:      Height:       Patient appears to be in no acute distress  patient is alert and oriented x3 Atraumatic normocephalic head No cervical or supraclavicular lymphadenopathy appreciated No increased work of breathing, no audible wheezes/rhonchi Regular sinus rhythm/rate Abdomen is soft, nontender, nondistended, no CVA or suprapubic tenderness Three-way Foley catheter emanating from the penis.  On a slow CBI drip the urine was pink in color Lower extremities are symmetric without appreciable edema Grossly neurologically intact No identifiable skin lesions  Recent Labs    09/16/23 1423  WBC 22.8*  HGB 7.0*  HCT 23.0*   Recent Labs    09/16/23 1423  NA 130*  K 5.1  CL 97*  CO2 20*  GLUCOSE 190*  BUN 52*  CREATININE 7.03*  CALCIUM  9.5   No results for input(s): LABPT, INR in the last 72 hours. No  results for input(s): LABURIN in the last 72 hours. Results for orders placed or  performed during the hospital encounter of 08/11/23  Urine Culture     Status: Abnormal   Collection Time: 08/11/23 11:08 AM   Specimen: Urine, Catheterized  Result Value Ref Range Status   Specimen Description   Final    URINE, CATHETERIZED Performed at [facility], [address] [place], [place] [address]    Special Requests   Final    NONE Performed at [facility], [address] [place], [place] [address]    Culture (A)  Final    80,000 COLONIES/mL KLEBSIELLA PNEUMONIAE >=100,000 COLONIES/mL ENTEROCOCCUS FAECALIS    Report Status 08/15/2023 FINAL  Final   Organism ID, Bacteria KLEBSIELLA PNEUMONIAE (A)  Final   Organism ID, Bacteria ENTEROCOCCUS FAECALIS (A)  Final      Susceptibility   Enterococcus faecalis - MIC*    AMPICILLIN  <=2 SENSITIVE Sensitive     NITROFURANTOIN <=16 SENSITIVE Sensitive     VANCOMYCIN 1 SENSITIVE Sensitive     * >=100,000 COLONIES/mL ENTEROCOCCUS FAECALIS   Klebsiella pneumoniae - MIC*    AMPICILLIN  >=32 RESISTANT Resistant     CEFAZOLIN  <=4 SENSITIVE Sensitive     CEFEPIME  <=0.12 SENSITIVE Sensitive     CEFTRIAXONE  <=0.25 SENSITIVE Sensitive     CIPROFLOXACIN  <=0.25 SENSITIVE Sensitive     GENTAMICIN  <=1 SENSITIVE Sensitive     IMIPENEM <=0.25 SENSITIVE Sensitive     NITROFURANTOIN 256 RESISTANT Resistant     TRIMETH /SULFA  <=20 SENSITIVE Sensitive     AMPICILLIN /SULBACTAM 8 SENSITIVE Sensitive     PIP/TAZO <=4 SENSITIVE Sensitive ug/mL    * 80,000 COLONIES/mL KLEBSIELLA PNEUMONIAE    Imaging: I reviewed the patient's CT scan obtained in the emergency department today that demonstrated markedly distended bladder with heterogeneous material within the bladder as well as air, likely consistent with blood clot and recent instrumentation.  He also had worsening hydronephrosis and an anterior bladder diverticulum that was previously not  present.  Imp: The patient has acute renal failure secondary to clot retention.  The etiology of his hematuria is unclear, CT scan does demonstrate what appears to be a large clot within the bladder and significant amount of air.  Concerning is whether or not the clot within the patient's bladder is consolidated and infected.  His three-way catheter now is draining fairly well on a slow continuous bladder irrigation.  However, I do not think that we are able to evacuate the entire clot burden.  Recommendations: Admit to medicine.  Continue with CBI.  Make the patient NPO.  He may need to be taken to the operating room tomorrow for clot of back/bladder biopsy.  We will check on him early in the morning to make a more definitive plan at that time.   Morene LELON Salines

## 2023-09-16 NOTE — ED Triage Notes (Addendum)
 See first nurse note.

## 2023-09-17 DIAGNOSIS — N1832 Chronic kidney disease, stage 3b: Secondary | ICD-10-CM | POA: Diagnosis not present

## 2023-09-17 DIAGNOSIS — Z960 Presence of urogenital implants: Secondary | ICD-10-CM

## 2023-09-17 DIAGNOSIS — N179 Acute kidney failure, unspecified: Secondary | ICD-10-CM | POA: Diagnosis not present

## 2023-09-17 DIAGNOSIS — N401 Enlarged prostate with lower urinary tract symptoms: Secondary | ICD-10-CM

## 2023-09-17 LAB — GLUCOSE, CAPILLARY
Glucose-Capillary: 121 mg/dL — ABNORMAL HIGH (ref 70–99)
Glucose-Capillary: 129 mg/dL — ABNORMAL HIGH (ref 70–99)
Glucose-Capillary: 137 mg/dL — ABNORMAL HIGH (ref 70–99)
Glucose-Capillary: 143 mg/dL — ABNORMAL HIGH (ref 70–99)
Glucose-Capillary: 182 mg/dL — ABNORMAL HIGH (ref 70–99)

## 2023-09-17 LAB — CBC
HCT: 22.2 % — ABNORMAL LOW (ref 39.0–52.0)
Hemoglobin: 6.4 g/dL — ABNORMAL LOW (ref 13.0–17.0)
MCH: 26.9 pg (ref 26.0–34.0)
MCHC: 28.8 g/dL — ABNORMAL LOW (ref 30.0–36.0)
MCV: 93.3 fL (ref 80.0–100.0)
Platelets: 241 K/uL (ref 150–400)
RBC: 2.38 MIL/uL — ABNORMAL LOW (ref 4.22–5.81)
RDW: 15.9 % — ABNORMAL HIGH (ref 11.5–15.5)
WBC: 20.2 K/uL — ABNORMAL HIGH (ref 4.0–10.5)
nRBC: 0 % (ref 0.0–0.2)

## 2023-09-17 LAB — BASIC METABOLIC PANEL WITH GFR
Anion gap: 12 (ref 5–15)
BUN: 54 mg/dL — ABNORMAL HIGH (ref 8–23)
CO2: 20 mmol/L — ABNORMAL LOW (ref 22–32)
Calcium: 9.1 mg/dL (ref 8.9–10.3)
Chloride: 104 mmol/L (ref 98–111)
Creatinine, Ser: 6.62 mg/dL — ABNORMAL HIGH (ref 0.61–1.24)
GFR, Estimated: 9 mL/min — ABNORMAL LOW (ref >60)
Glucose, Bld: 123 mg/dL — ABNORMAL HIGH (ref 70–99)
Potassium: 5.1 mmol/L (ref 3.5–5.1)
Sodium: 136 mmol/L (ref 135–145)

## 2023-09-17 LAB — HEMOGLOBIN AND HEMATOCRIT, BLOOD
HCT: 27.1 % — ABNORMAL LOW (ref 39.0–52.0)
Hemoglobin: 7.9 g/dL — ABNORMAL LOW (ref 13.0–17.0)

## 2023-09-17 LAB — PREPARE RBC (CROSSMATCH)

## 2023-09-17 MED ORDER — METOPROLOL SUCCINATE ER 50 MG PO TB24
100.0000 mg | ORAL_TABLET | Freq: Every day | ORAL | Status: DC
  Administered 2023-09-17 – 2023-09-28 (×13): 100 mg via ORAL
  Filled 2023-09-17 (×12): qty 2

## 2023-09-17 MED ORDER — VILAZODONE HCL 20 MG PO TABS
20.0000 mg | ORAL_TABLET | Freq: Every day | ORAL | Status: DC
  Administered 2023-09-17 – 2023-09-28 (×13): 20 mg via ORAL
  Filled 2023-09-17 (×12): qty 1

## 2023-09-17 MED ORDER — ENSURE PLUS HIGH PROTEIN PO LIQD
237.0000 mL | Freq: Two times a day (BID) | ORAL | Status: DC
  Administered 2023-09-18 – 2023-09-25 (×8): 237 mL via ORAL

## 2023-09-17 MED ORDER — FOLIC ACID 1 MG PO TABS
1.0000 mg | ORAL_TABLET | Freq: Every day | ORAL | Status: DC
  Administered 2023-09-17 – 2023-09-28 (×13): 1 mg via ORAL
  Filled 2023-09-17 (×12): qty 1

## 2023-09-17 MED ORDER — SODIUM BICARBONATE 650 MG PO TABS
650.0000 mg | ORAL_TABLET | Freq: Two times a day (BID) | ORAL | Status: DC
  Administered 2023-09-17 – 2023-09-28 (×24): 650 mg via ORAL
  Filled 2023-09-17 (×24): qty 1

## 2023-09-17 MED ORDER — ADULT MULTIVITAMIN W/MINERALS CH
1.0000 | ORAL_TABLET | Freq: Every day | ORAL | Status: DC
  Administered 2023-09-18 – 2023-09-27 (×10): 1 via ORAL
  Filled 2023-09-17 (×9): qty 1

## 2023-09-17 MED ORDER — SODIUM CHLORIDE 0.9% IV SOLUTION: Freq: Once | INTRAVENOUS | Status: AC

## 2023-09-17 MED ORDER — ATORVASTATIN CALCIUM 20 MG PO TABS
20.0000 mg | ORAL_TABLET | Freq: Every day | ORAL | Status: DC
  Administered 2023-09-17 – 2023-09-27 (×12): 20 mg via ORAL
  Filled 2023-09-17 (×10): qty 1

## 2023-09-17 MED ORDER — FINASTERIDE 5 MG PO TABS
5.0000 mg | ORAL_TABLET | Freq: Every day | ORAL | Status: DC
  Administered 2023-09-17 – 2023-09-28 (×13): 5 mg via ORAL
  Filled 2023-09-17 (×12): qty 1

## 2023-09-17 MED ORDER — VITAMIN D 25 MCG (1000 UNIT) PO TABS
1000.0000 [IU] | ORAL_TABLET | Freq: Every day | ORAL | Status: DC
  Administered 2023-09-17 – 2023-09-27 (×11): 1000 [IU] via ORAL
  Filled 2023-09-17 (×10): qty 1

## 2023-09-17 MED ORDER — CHLORHEXIDINE GLUCONATE CLOTH 2 % EX PADS
6.0000 | MEDICATED_PAD | Freq: Every day | CUTANEOUS | Status: DC
  Administered 2023-09-18 – 2023-09-28 (×7): 6 via TOPICAL

## 2023-09-17 MED ORDER — LOPERAMIDE HCL 2 MG PO CAPS: 2.0000 mg | ORAL_CAPSULE | Freq: Four times a day (QID) | ORAL | Status: DC | PRN

## 2023-09-17 MED ORDER — DOLUTEGRAVIR-RILPIVIRINE 50-25 MG PO TABS
1.0000 | ORAL_TABLET | Freq: Every day | ORAL | Status: DC
  Administered 2023-09-17 – 2023-09-28 (×13): 1 via ORAL
  Filled 2023-09-17 (×12): qty 1

## 2023-09-17 NOTE — TOC Initial Note (Signed)
 Transition of Care [facility]) - Initial/Assessment Note    Patient Details  Name: Jay Flores MRN: [id number] Date of Birth: June 02, 1960  [facility] CM/SW Contact:    Dalia GORMAN Fuse, RN Phone Number: 09/17/2023, 7:18 AM  Clinical Narrative:                 Patient is inpatient receiving IVF and IV ABX with AKI, BUN 52, Cr 7.03 and WBC 22.8. The patient is from [facility]. Patient is wheelchair bound but does walk some with a walker. Medications will need to be sent to [facility].  The contact at the home is [name] ph: [phone] fax:[phone]  TOC will continue to follow  Expected Discharge Plan: Group Home Barriers to Discharge: Continued Medical Work up   Patient Goals and CMS Choice            Expected Discharge Plan and Services   Discharge Planning Services: CM Consult   Living arrangements for the past 2 months: Group Home                                      Prior Living Arrangements/Services Living arrangements for the past 2 months: Group Home Lives with:: Facility Resident              Current home services: DME    Activities of Daily Living   ADL Screening (condition at time of admission) Independently performs ADLs?: No Does the patient have a NEW difficulty with bathing/dressing/toileting/self-feeding that is expected to last >3 days?: No Does the patient have a NEW difficulty with getting in/out of bed, walking, or climbing stairs that is expected to last >3 days?: No Does the patient have a NEW difficulty with communication that is expected to last >3 days?: No  Permission Sought/Granted                  Emotional Assessment Appearance:: Appears stated age     Orientation: : Oriented to Place, Oriented to Self   Psych Involvement: No (comment)  Admission diagnosis:  Urinary retention [R33.9] AKI (acute kidney injury) (HCC) [N17.9] Hematuria, unspecified type [R31.9] Sepsis, due  to unspecified organism, unspecified whether acute organ dysfunction present [facility]) [A41.9] Acute renal failure superimposed on stage 3b chronic kidney disease (HCC) [N17.9, N18.32] Acute kidney injury superimposed on stage 3b chronic kidney disease (HCC) [N17.9, N18.32] Patient Active Problem List   Diagnosis Date Noted   Acute kidney injury superimposed on stage 3b chronic kidney disease (HCC) 09/16/2023   Bilateral hydronephrosis 09/16/2023   Acute urinary retention 09/16/2023   CAD (coronary artery disease) 09/16/2023   DVT (deep venous thrombosis)_left leg 09/16/2023   Pulmonary embolism (HCC) 09/16/2023   Iron  deficiency anemia 09/16/2023   Bladder wall thickening 09/16/2023   Bladder mass 08/22/2023   Protein-calorie malnutrition, severe 08/01/2023   NSTEMI (non-ST elevated myocardial infarction) (HCC) 07/29/2023   Diabetes mellitus type 2, noninsulin dependent (HCC) 07/29/2023   CKD (chronic kidney disease) stage 4, GFR 15-29 ml/min (HCC) 07/29/2023   Leukocytosis 07/29/2023   Right pulmonary infiltrate on CXR 07/29/2023   Community acquired pneumonia 07/06/2023   Chest discomfort 07/06/2023   Depression 07/06/2023   Anemia in chronic kidney disease (CKD) 04/24/2023   HLD (hyperlipidemia) 04/24/2023   Syncope 04/24/2023   Type II diabetes mellitus with renal manifestations (HCC) 04/24/2023   Prostate cancer (HCC) 04/24/2023  Elevated lactic acid level 04/24/2023   Pancreatic cyst 03/23/2022   Callus 11/23/2021   Pain due to onychomycosis of toenails of both feet 06/15/2021   Diabetic neuropathy (HCC) 06/15/2021   Bladder dysfunction 05/07/2021   H/O urinary retention 05/07/2021   Tobacco use 04/17/2018   DKA (diabetic ketoacidosis) (HCC) 03/29/2017   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   Leg weakness, bilateral 01/18/2016   Difficulty walking 12/19/2015   Hyperreflexia of lower extremity 12/19/2015   Muscle spasm of both lower legs 12/19/2015   HTN (hypertension)  02/19/2012   Weakness 02/19/2012   Metabolic acidosis 01/27/2012   Hypokalemia 01/23/2012   PNA (pneumonia) 01/22/2012   HIV (human immunodeficiency virus infection) (HCC) 01/22/2012   Brain lesion 01/22/2012   Acute renal failure (HCC) 01/22/2012   Anemia 01/22/2012   Altered mental status 01/22/2012   Hepatitis C infection 10/05/2011   PCP:  [name], MD Pharmacy:   [facility] - [place], [place] - [address] [place] [address] Phone: [phone] Fax: [phone]     Social Drivers of Health (SDOH) Social History: SDOH Screenings   Food Insecurity: No Food Insecurity (09/17/2023)  Housing: Low Risk  (09/17/2023)  Transportation Needs: No Transportation Needs (09/17/2023)  Utilities: Not At Risk (09/17/2023)  Depression (PHQ2-9): Low Risk  (03/23/2022)  Tobacco Use: High Risk (09/16/2023)   SDOH Interventions:     Readmission Risk Interventions    07/31/2023    9:41 AM  Readmission Risk Prevention Plan  Transportation Screening Complete  Medication Review (RN Care Manager) Complete  PCP or Specialist appointment within 3-5 days of discharge Complete  SW Recovery Care/Counseling Consult Complete  Palliative Care Screening Not Applicable  Skilled Nursing Facility Not Applicable

## 2023-09-17 NOTE — Progress Notes (Signed)
 Urology Inpatient Progress Note  Subjective: No acute events overnight. He is afebrile, VSS. Hemoglobin down, 6.4.  Creatinine down, 6.62. Foley catheter in place draining pink urine on fast drip CBI. He denies pain.  He reports his gross hematuria has been intermittent.  Anti-infectives: Anti-infectives (From admission, onward)    Start     Dose/Rate Route Frequency Ordered Stop   09/17/23 1000  dolutegravir -rilpivirine  (JULUCA ) 50-25 MG per tablet 1 tablet        1 tablet Oral Daily 09/17/23 0147     09/17/23 0600  cefTRIAXone  (ROCEPHIN ) 2 g in sodium chloride  0.9 % 100 mL IVPB        2 g 200 mL/hr over 30 Minutes Intravenous Every 24 hours 09/16/23 2207     09/16/23 1645  ceFEPIme  (MAXIPIME ) 2 g in sodium chloride  0.9 % 100 mL IVPB        2 g 200 mL/hr over 30 Minutes Intravenous  Once 09/16/23 1639 09/16/23 1840   09/16/23 1645  metroNIDAZOLE  (FLAGYL ) IVPB 500 mg        500 mg 100 mL/hr over 60 Minutes Intravenous  Once 09/16/23 1639 09/16/23 1840       Current Facility-Administered Medications  Medication Dose Route Frequency Provider Last Rate Last Admin   acetaminophen  (TYLENOL ) tablet 650 mg  650 mg Oral Q6H PRN Niu, Xilin, MD       albuterol  (PROVENTIL ) (2.5 MG/3ML) 0.083% nebulizer solution 2.5 mg  2.5 mg Inhalation Q4H PRN Niu, Xilin, MD       atorvastatin  (LIPITOR) tablet 20 mg  20 mg Oral QHS Niu, Xilin, MD       cefTRIAXone  (ROCEPHIN ) 2 g in sodium chloride  0.9 % 100 mL IVPB  2 g Intravenous Q24H Niu, Xilin, MD 200 mL/hr at 09/17/23 0617 2 g at 09/17/23 0617   cholecalciferol  (VITAMIN D3) 25 MCG (1000 UNIT) tablet 1,000 Units  1,000 Units Oral QHS Niu, Xilin, MD       dextromethorphan-guaiFENesin  (MUCINEX  DM) 30-600 MG per 12 hr tablet 1 tablet  1 tablet Oral BID PRN Niu, Xilin, MD       dolutegravir -rilpivirine  (JULUCA ) 50-25 MG per tablet 1 tablet  1 tablet Oral Daily Niu, Xilin, MD   1 tablet at 09/17/23 0848   feeding supplement (ENSURE PLUS HIGH PROTEIN) liquid  237 mL  237 mL Oral BID BM Niu, Xilin, MD       finasteride  (PROSCAR ) tablet 5 mg  5 mg Oral Daily Niu, Xilin, MD   5 mg at 09/17/23 0847   folic acid  (FOLVITE ) tablet 1 mg  1 mg Oral Daily Niu, Xilin, MD   1 mg at 09/17/23 0847   hydrALAZINE  (APRESOLINE ) injection 5 mg  5 mg Intravenous Q2H PRN Niu, Xilin, MD       insulin  aspart (novoLOG ) injection 0-5 Units  0-5 Units Subcutaneous QHS Niu, Xilin, MD       insulin  aspart (novoLOG ) injection 0-9 Units  0-9 Units Subcutaneous TID WC Niu, Xilin, MD   1 Units at 09/17/23 0848   lactated ringers  infusion   Intravenous Continuous Niu, Xilin, MD 75 mL/hr at 09/17/23 0619 New Bag at 09/17/23 9380   loperamide  (IMODIUM ) capsule 2 mg  2 mg Oral QID PRN Niu, Xilin, MD       metoprolol  succinate (TOPROL -XL) 24 hr tablet 100 mg  100 mg Oral Daily Niu, Xilin, MD   100 mg at 09/17/23 0847   nicotine  (NICODERM CQ  - dosed in mg/24 hours) patch  21 mg  21 mg Transdermal Daily Niu, Xilin, MD   21 mg at 09/17/23 9093   ondansetron  (ZOFRAN ) injection 4 mg  4 mg Intravenous Q8H PRN Niu, Xilin, MD       oxyCODONE  (Oxy IR/ROXICODONE ) immediate release tablet 5 mg  5 mg Oral Q6H PRN Niu, Xilin, MD   5 mg at 09/17/23 0414   sodium bicarbonate  tablet 650 mg  650 mg Oral BID Niu, Xilin, MD   650 mg at 09/17/23 9587   sodium chloride  irrigation 0.9 % 3,000 mL  3,000 mL Irrigation Continuous Funke, Mary E, MD   3,000 mL at 09/16/23 2207   tamsulosin  (FLOMAX ) capsule 0.4 mg  0.4 mg Oral QPC supper Niu, Xilin, MD   0.4 mg at 09/16/23 2245   Vilazodone  HCl TABS 20 mg  20 mg Oral Daily Niu, Xilin, MD   20 mg at 09/17/23 0848     Objective: Vital signs in last 24 hours: Temp:  [97.5 F (36.4 C)-98 F (36.7 C)] 98 F (36.7 C) (08/12 0900) Pulse Rate:  [90-101] 92 (08/12 0900) Resp:  [16-24] 20 (08/12 0415) BP: (124-151)/(73-86) 139/82 (08/12 0900) SpO2:  [100 %] 100 % (08/12 0900) Weight:  [47.6 kg] 47.6 kg (08/11 1345)  Intake/Output from previous day: 08/11 0701 -  08/12 0700 In: 300  Out: 1000 [Urine:1000] Intake/Output this shift: No intake/output data recorded.  Physical Exam Vitals and nursing note reviewed.  Constitutional:      General: He is awake.     Appearance: He is underweight.  Pulmonary:     Effort: Pulmonary effort is normal. No respiratory distress.  Abdominal:     General: There is no distension.     Tenderness: There is no abdominal tenderness. There is no guarding or rebound.  Skin:    General: Skin is warm and dry.  Neurological:     Mental Status: He is alert and oriented to person, place, and time.  Psychiatric:        Mood and Affect: Mood normal.        Behavior: Behavior normal.     Lab Results:  Recent Labs    09/16/23 1423 09/17/23 0433  WBC 22.8* 20.2*  HGB 7.0* 6.4*  HCT 23.0* 22.2*  PLT 285 241   BMET Recent Labs    09/16/23 1423 09/17/23 0433  NA 130* 136  K 5.1 5.1  CL 97* 104  CO2 20* 20*  GLUCOSE 190* 123*  BUN 52* 54*  CREATININE 7.03* 6.62*  CALCIUM  9.5 9.1   PT/INR Recent Labs    09/16/23 2308  LABPROT 27.8*  INR 2.5*   ABG No results for input(s): PHART, HCO3 in the last 72 hours.  Invalid input(s): PCO2, PO2  Studies/Results: CT ABDOMEN PELVIS WO CONTRAST Result Date: 09/16/2023 CLINICAL DATA:  Chest and abdominal pain EXAM: CT ABDOMEN AND PELVIS WITHOUT CONTRAST TECHNIQUE: Multidetector CT imaging of the abdomen and pelvis was performed following the standard protocol without IV contrast. RADIATION DOSE REDUCTION: This exam was performed according to the departmental dose-optimization program which includes automated exposure control, adjustment of the mA and/or kV according to patient size and/or use of iterative reconstruction technique. COMPARISON:  08/16/2023 FINDINGS: Lower chest: Mild scarring is noted in the right lung base. Hepatobiliary: Cholelithiasis is seen without complicating factors. The liver appears within normal limits. Pancreas: Scattered  calcifications are noted throughout the pancreas consistent with chronic pancreatitis. The previously seen cystic change adjacent to the head of the pancreas  and second portion of the duodenum is again noted and stable measuring approximately 2 cm. The more peripheral pancreas appears within normal limits. Spleen: Normal in size without focal abnormality. Adrenals/Urinary Tract: Adrenal glands are within normal limits. Persistent hydronephrosis and hydroureter is noted bilateral likely related to an over distended bladder. Foley catheter is noted in place there is considerable mixed attenuation density within the bladder lumen consistent with air, urine and likely thrombus. Persistent bladder wall thickening is again noted with evidence of a superior bladder diverticulum. These changes likely contribute to the degree of hydronephrosis. Stomach/Bowel: Scattered fecal material is noted throughout the colon. No obstructive or inflammatory changes of the colon are seen. The appendix is partially visualized and within normal limits. Small bowel and stomach appear within normal limits. Vascular/Lymphatic: Aortic atherosclerosis. No enlarged abdominal or pelvic lymph nodes. Previously seen lymph node adjacent to the rectum is no longer identified. Reproductive: Prostate is unremarkable. Other: No abdominal wall hernia or abnormality. No abdominopelvic ascites. Musculoskeletal: Bilateral L5 pars defects are noted without significant anterolisthesis. No acute bony abnormality is noted. 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 Mixed echogenicity material is noted within the bladder representing air, urine and likely hyperdense thrombus. Correlate with Foley output. The over distention of the bladder contributes to the bilateral hydronephrosis which is similar to that seen on the prior exam. Diffuse bladder wall thickening is noted suspicious for underlying neoplasm. Correlate with clinical  history. Cholelithiasis without complicating factors. Chronic pancreatitis with a 2 cm pancreatic pseudocyst in the region of the head stable from the prior study. Electronically Signed   By: Oneil Devonshire M.D.   On: 09/16/2023 20:25   DG Chest 1 View Result Date: 09/16/2023 CLINICAL DATA:  Chest pain. EXAM: CHEST  1 VIEW COMPARISON:  July 29, 2023 FINDINGS: The heart size and mediastinal contours are within normal limits. Mild, stable linear scarring and/or atelectasis is seen along the periphery of the right lung base. No acute infiltrate, pleural effusion or pneumothorax is identified. The visualized skeletal structures are unremarkable. IMPRESSION: Mild, stable right basilar linear scarring and/or atelectasis. Electronically Signed   By: Suzen Dials M.D.   On: 09/16/2023 14:51   Bladder Irrigation  Due to gross hematuria with clot retention patient is present today for a bladder irrigation. Patient was cleaned and prepped in a sterile fashion. 240 mL of sterile water was instilled into the bladder with a 70mL [place]y syringe through the catheter in place.  of urine return was cleared from the bladder with evacuation of 5ccs of clot material. Efflux cleared remained light pink throughout with the procedure and the catheter irrigated easily. Upon completion, the catheter was draining well and was reattached to the night bag for drainage. Patient tolerated well.   Performed by: Lucie Hones, PA-C   Assessment & Plan: 63 y.o. male with PMH prostate cancer s/p gold seed in March on 6 months of Eligard  and who completed IMRT with Dr. Lenn earlier this year, BPH with urinary retention on finasteride  managed with chronic Foley catheter, had a recent history of intermittent gross hematuria now admitted with gross hematuria with clot retention.  I irrigated his Foley catheter at the bedside this morning.  It irrigated easily and I cleared a small volume of residual clot, low suspicion  for significant retained clot.  Upon completion, I resumed fast drip CBI and efflux remained light pink.  Recommendations: -Continue CBI and titrate flow to keep efflux light pink or clearer -  Trend BMP, CBC, transfuse per primary team - No plans for urologic procedure at this time, he may have a diet - Will plan for outpatient cystoscopy with Dr. Twylla Lucie Hones, PA-C 09/17/2023

## 2023-09-17 NOTE — Progress Notes (Signed)
 PROGRESS NOTE    Jay Flores   FMW:[id number] DOB: August 27, 1960  DOA: 09/16/2023 Date of Service: 09/17/23 which is hospital day 1  PCP: [name], MD    Hospital course / significant events:   HPI: Jay Flores is a 63 y.o. male, group home resident,  with medical history significant of hypertension, hyperlipidemia, diabetes mellitus, COPD, CKD-3B, HIV (CD 258 on 03/29/17 and VL 70 on 04/25/23), HIV dementia HCV (s/p of Harvoni), thrombocytopenia, prostate cancer, indwelling Foley catheter placement, iron  deficiency anemia, DVT/PE on Eliquis , who presents to ED  08/11 with lower abdominal pain and hematuria. Patient is a poor historian, history is limited.  Per report, pt had iron  infusion in cancer center today.  He tolerated iron  transfusion okay, but after that he developed abdominal pain and chest pain.  His chest pain has resolved in the ED but and pain persists.   08/11: to ED, CT (+)large distended bladder with superior bladder wall diverticulum, progressive or worsening bilateral hydroureteronephrosis. WBC 22.8, worsening renal function, troponin 23 --> 24, lactic acid of 5.4 --> 5.3. Urology consult - 80 French catheter was removed and exchanged for a 22 [place] three-way Foley catheter, irrigated with about a liter and a half of saline and evacuated significant amount of clot, placed on CBI with symptoms improved.  08/12: continue CBI per urology, no plans for cysto at this time. Hgb dropping - 1 unit PRBC and monitor HH      Consultants:  Urology   Procedures/Surgeries: none      ASSESSMENT & PLAN:   Acute kidney injury superimposed on stage 3b chronic kidney disease due to bilateral hydronephrosis and acute urinary retention 2/2 to possible bladder thrombus / clot in Foley, question UTI  Baseline creatinine 2.62 on 08/16/2023.   On admission 08/11, creatinine is 7.03, BUN 52, GFR 8.  Cr 6.62 as of 09/17/23   Treat underlying urological issues as below   IV fluids  Monitor BMP  Possible UTI complicating chronic Foley, acute retention Cefepime  and Flagyl  were started in ED, will change to Rocephin  and follow-up UA and urine culture. Urology following   Hematuria etiology of hematuria is unclear CT shows large clot within the bladder, clinically significant, causing obstruction/retention, question if consolidated and infected.  CT showed diffuse bladder wall thickening, clinically significant, suspicious for underlying neoplasm Hold Eliquis  Continuous bladder irrigation Urology following holding off cystoscopy for now  1 unit PRBC and monitor HH   ABLA d/t hematuria see above Anemia chronic disease  Treat underlying cause as above  Hold eliquis   Transfuse <7 - 1 unit PRBC and monitor HH   Prostate cancer status post external beam radiation, completed in June 2025.  Chronic urinary retention managed with a Foley catheter. first noted to be in urinary retention in August 2022. He has his catheter exchanged every 4 weeks in the urology clinic.  Acute urinary retention d/t clot Urology follow up  Continue Proscar  and start Flomax    Bladder wall thickening:  f/u urologist recommendation   DVT (deep venous thrombosis)_left leg and pulmonary embolism  Temporarily hold Eliquis    CAD (coronary artery disease):  Troponin 23 --> 26, possibly due to demand ischemia. Continue Lipitor   HTN (hypertension) IV hydralazine  as needed Hold Cozaar  due to worsening renal function   Type II diabetes mellitus with renal manifestations [facility]): Recent A1c 6.3, well-controlled.   Patient is taking Jardiance and metformin SSI   HLD (hyperlipidemia) Lipitor   HIV (human immunodeficiency virus  infection) (HCC) Juluca    Elevated lactic acid level:  Lactic acid 5.4  --> 5.3.   leukocytosis with WBC 22.8, but no fever, fever.   Clinically does not seem to have sepsis.   Likely due to dehydration. IV fluid --> regular diet, can dc fluids if  tolerating po intake and Cr improving Trend lactic acid level, WBC, sepsis parameters   Tobacco use Nicotine  patch   Protein-calorie malnutrition, severe underweight based on BMI: Body mass index is 15.96 kg/m.SABRA Significantly low or high BMI is associated with higher medical risk.  Underweight - under 18  overweight - 25 to 29 obese - 30 or more Class 1 obesity: BMI of 30.0 to 34 Class 2 obesity: BMI of 35.0 to 39 Class 3 obesity: BMI of 40.0 to 49 Super Morbid Obesity: BMI 50-59 Super-super Morbid Obesity: BMI 60+ Healthy nutrition and physical activity advised as adjunct to other disease management and risk reduction treatments Dietician consult     DVT prophylaxis: SCD IV fluids: normal saline continuous IV fluids  Nutrition: regular dier Central lines / other devices: Foley  Code Status: FULL CODE ACP documentation reviewed:  has HCPOA on file in [facility]  TOC needs: expect back to group home when stable Medical barriers to dispo: anemia, ongoing bladder irrigation. Expected medical readiness for discharge next few days / pending clinical course .              Subjective / Brief ROS:  Patient reports no concerns right now other than hungry, feeling better compared to yesterday  Denies CP/SOB.  Pain controlled.  Denies new weakness..    Family Communication: none at this time    Objective Findings:  Vitals:   09/16/23 2217 09/16/23 2327 09/17/23 0415 09/17/23 1149  BP: (!) 146/81 (!) 151/80 138/77 107/73  Pulse: 93 99 91 75  Resp:  (!) 24 20 18   Temp: 97.8 F (36.6 C) (!) 97.5 F (36.4 C) 97.7 F (36.5 C) 98.5 F (36.9 C)  TempSrc:  Oral    SpO2: 100% 100% 100% 100%  Weight:      Height:        Intake/Output Summary (Last 24 hours) at 09/17/2023 1319 Last data filed at 09/17/2023 0100 Gross per 24 hour  Intake 300 ml  Output 1000 ml  Net -700 ml   Filed Weights   09/16/23 1345  Weight: 47.6 kg    Examination:  Physical  Exam Constitutional:      General: He is not in acute distress. Cardiovascular:     Rate and Rhythm: Normal rate and regular rhythm.  Pulmonary:     Breath sounds: Normal breath sounds.  Musculoskeletal:     Right lower leg: No edema.     Left lower leg: No edema.  Skin:    General: Skin is warm and dry.  Neurological:     Mental Status: He is alert.          Scheduled Medications:   sodium chloride    Intravenous Once   atorvastatin   20 mg Oral QHS   Chlorhexidine  Gluconate Cloth  6 each Topical Daily   cholecalciferol   1,000 Units Oral QHS   dolutegravir -rilpivirine   1 tablet Oral Daily   [START ON 09/18/2023] feeding supplement  237 mL Oral BID BM   finasteride   5 mg Oral Daily   folic acid   1 mg Oral Daily   insulin  aspart  0-5 Units Subcutaneous QHS   insulin  aspart  0-9 Units Subcutaneous TID WC  metoprolol  succinate  100 mg Oral Daily   [START ON 09/18/2023] multivitamin with minerals  1 tablet Oral Daily   nicotine   21 mg Transdermal Daily   sodium bicarbonate   650 mg Oral BID   tamsulosin   0.4 mg Oral QPC supper   Vilazodone  HCl  20 mg Oral Daily    Continuous Infusions:  cefTRIAXone  (ROCEPHIN )  IV 2 g (09/17/23 0617)   sodium chloride  irrigation      PRN Medications:  acetaminophen , albuterol , dextromethorphan-guaiFENesin , hydrALAZINE , loperamide , ondansetron  (ZOFRAN ) IV, oxyCODONE   Antimicrobials from admission:  Anti-infectives (From admission, onward)    Start     Dose/Rate Route Frequency Ordered Stop   09/17/23 1000  dolutegravir -rilpivirine  (JULUCA ) 50-25 MG per tablet 1 tablet        1 tablet Oral Daily 09/17/23 0147     09/17/23 0600  cefTRIAXone  (ROCEPHIN ) 2 g in sodium chloride  0.9 % 100 mL IVPB        2 g 200 mL/hr over 30 Minutes Intravenous Every 24 hours 09/16/23 2207     09/16/23 1645  ceFEPIme  (MAXIPIME ) 2 g in sodium chloride  0.9 % 100 mL IVPB        2 g 200 mL/hr over 30 Minutes Intravenous  Once 09/16/23 1639 09/16/23 1840    09/16/23 1645  metroNIDAZOLE  (FLAGYL ) IVPB 500 mg        500 mg 100 mL/hr over 60 Minutes Intravenous  Once 09/16/23 1639 09/16/23 1840           Data Reviewed:  I have personally reviewed the following...  CBC: Recent Labs  Lab 09/16/23 1423 09/17/23 0433  WBC 22.8* 20.2*  HGB 7.0* 6.4*  HCT 23.0* 22.2*  MCV 91.3 93.3  PLT 285 241   Basic Metabolic Panel: Recent Labs  Lab 09/16/23 1423 09/17/23 0433  NA 130* 136  K 5.1 5.1  CL 97* 104  CO2 20* 20*  GLUCOSE 190* 123*  BUN 52* 54*  CREATININE 7.03* 6.62*  CALCIUM  9.5 9.1   GFR: Estimated Creatinine Clearance: 7.8 mL/min (A) (by C-G formula based on SCr of 6.62 mg/dL (H)). Liver Function Tests: No results for input(s): AST, ALT, ALKPHOS, BILITOT, PROT, ALBUMIN  in the last 168 hours. Recent Labs  Lab 09/16/23 1423  LIPASE 21   No results for input(s): AMMONIA in the last 168 hours. Coagulation Profile: Recent Labs  Lab 09/16/23 2308  INR 2.5*   Cardiac Enzymes: No results for input(s): CKTOTAL, CKMB, CKMBINDEX, TROPONINI in the last 168 hours. BNP (last 3 results) No results for input(s): PROBNP in the last 8760 hours. HbA1C: No results for input(s): HGBA1C in the last 72 hours. CBG: Recent Labs  Lab 09/17/23 0009 09/17/23 0740 09/17/23 1223  GLUCAP 129* 143* 121*   Lipid Profile: No results for input(s): CHOL, HDL, LDLCALC, TRIG, CHOLHDL, LDLDIRECT in the last 72 hours. Thyroid Function Tests: No results for input(s): TSH, T4TOTAL, FREET4, T3FREE, THYROIDAB in the last 72 hours. Anemia Panel: No results for input(s): VITAMINB12, FOLATE, FERRITIN, TIBC, IRON , RETICCTPCT in the last 72 hours. Most Recent Urinalysis On File:     Component Value Date/Time   COLORURINE RED (A) 09/16/2023 2219   APPEARANCEUR CLOUDY (A) 09/16/2023 2219   APPEARANCEUR Cloudy (A) 02/11/2023 1123   LABSPEC  09/16/2023 2219    TEST NOT REPORTED DUE TO  COLOR INTERFERENCE OF URINE PIGMENT   PHURINE  09/16/2023 2219    TEST NOT REPORTED DUE TO COLOR INTERFERENCE OF URINE PIGMENT   GLUCOSEU (A)  09/16/2023 2219    TEST NOT REPORTED DUE TO COLOR INTERFERENCE OF URINE PIGMENT   HGBUR (A) 09/16/2023 2219    TEST NOT REPORTED DUE TO COLOR INTERFERENCE OF URINE PIGMENT   BILIRUBINUR (A) 09/16/2023 2219    TEST NOT REPORTED DUE TO COLOR INTERFERENCE OF URINE PIGMENT   BILIRUBINUR Negative 02/11/2023 1123   KETONESUR (A) 09/16/2023 2219    TEST NOT REPORTED DUE TO COLOR INTERFERENCE OF URINE PIGMENT   PROTEINUR (A) 09/16/2023 2219    TEST NOT REPORTED DUE TO COLOR INTERFERENCE OF URINE PIGMENT   UROBILINOGEN 1.0 01/23/2012 0602   NITRITE (A) 09/16/2023 2219    TEST NOT REPORTED DUE TO COLOR INTERFERENCE OF URINE PIGMENT   LEUKOCYTESUR (A) 09/16/2023 2219    TEST NOT REPORTED DUE TO COLOR INTERFERENCE OF URINE PIGMENT   Sepsis Labs: @LABRCNTIP (procalcitonin:4,lacticidven:4) Microbiology: No results found for this or any previous visit (from the past 240 hours).    Radiology Studies last 3 days: CT ABDOMEN PELVIS WO CONTRAST Result Date: 09/16/2023 CLINICAL DATA:  Chest and abdominal pain EXAM: CT ABDOMEN AND PELVIS WITHOUT CONTRAST TECHNIQUE: Multidetector CT imaging of the abdomen and pelvis was performed following the standard protocol without IV contrast. RADIATION DOSE REDUCTION: This exam was performed according to the departmental dose-optimization program which includes automated exposure control, adjustment of the mA and/or kV according to patient size and/or use of iterative reconstruction technique. COMPARISON:  08/16/2023 FINDINGS: Lower chest: Mild scarring is noted in the right lung base. Hepatobiliary: Cholelithiasis is seen without complicating factors. The liver appears within normal limits. Pancreas: Scattered calcifications are noted throughout the pancreas consistent with chronic pancreatitis. The previously seen cystic change  adjacent to the head of the pancreas and second portion of the duodenum is again noted and stable measuring approximately 2 cm. The more peripheral pancreas appears within normal limits. Spleen: Normal in size without focal abnormality. Adrenals/Urinary Tract: Adrenal glands are within normal limits. Persistent hydronephrosis and hydroureter is noted bilateral likely related to an over distended bladder. Foley catheter is noted in place there is considerable mixed attenuation density within the bladder lumen consistent with air, urine and likely thrombus. Persistent bladder wall thickening is again noted with evidence of a superior bladder diverticulum. These changes likely contribute to the degree of hydronephrosis. Stomach/Bowel: Scattered fecal material is noted throughout the colon. No obstructive or inflammatory changes of the colon are seen. The appendix is partially visualized and within normal limits. Small bowel and stomach appear within normal limits. Vascular/Lymphatic: Aortic atherosclerosis. No enlarged abdominal or pelvic lymph nodes. Previously seen lymph node adjacent to the rectum is no longer identified. Reproductive: Prostate is unremarkable. Other: No abdominal wall hernia or abnormality. No abdominopelvic ascites. Musculoskeletal: Bilateral L5 pars defects are noted without significant anterolisthesis. No acute bony abnormality is noted. 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 Mixed echogenicity material is noted within the bladder representing air, urine and likely hyperdense thrombus. Correlate with Foley output. The over distention of the bladder contributes to the bilateral hydronephrosis which is similar to that seen on the prior exam. Diffuse bladder wall thickening is noted suspicious for underlying neoplasm. Correlate with clinical history. Cholelithiasis without complicating factors. Chronic pancreatitis with a 2 cm pancreatic pseudocyst in the  region of the head stable from the prior study. Electronically Signed   By: Oneil Devonshire M.D.   On: 09/16/2023 20:25   DG Chest 1 View Result Date: 09/16/2023 CLINICAL DATA:  Chest pain. EXAM: CHEST  1 VIEW COMPARISON:  July 29, 2023 FINDINGS: The heart size and mediastinal contours are within normal limits. Mild, stable linear scarring and/or atelectasis is seen along the periphery of the right lung base. No acute infiltrate, pleural effusion or pneumothorax is identified. The visualized skeletal structures are unremarkable. IMPRESSION: Mild, stable right basilar linear scarring and/or atelectasis. Electronically Signed   By: Suzen Dials M.D.   On: 09/16/2023 14:51         Wilhelmena Zea, DO [facility] 09/17/2023, 1:19 PM    Dictation software may have been used to generate the above note. Typos may occur and escape review in typed/dictated notes. Please contact Dr Marsa directly for clarity if needed.  Staff may message me via secure chat in Epic  but this may not receive an immediate response,  please page me for urgent matters!  If 7PM-7AM, please contact night coverage [email or web address]

## 2023-09-17 NOTE — Hospital Course (Addendum)
 Hospital course / significant events:   HPI: Jay Flores is a 63 y.o. male, group home resident,  with medical history significant of hypertension, hyperlipidemia, diabetes mellitus, COPD, CKD-3B, HIV (CD 258 on 03/29/17 and VL 70 on 04/25/23), HIV dementia HCV (s/p of Harvoni), thrombocytopenia, prostate cancer, indwelling Foley catheter placement, iron  deficiency anemia, DVT/PE on Eliquis , who presents to ED  08/11 with lower abdominal pain and hematuria. Patient is a poor historian, history is limited.  Per report, pt had iron  infusion in cancer center today.  He tolerated iron  transfusion okay, but after that he developed abdominal pain and chest pain.  His chest pain has resolved in the ED but and pain persists.   08/11: to ED, CT (+)large distended bladder with superior bladder wall diverticulum, progressive or worsening bilateral hydroureteronephrosis. WBC 22.8, worsening renal function, troponin 23 --> 24, lactic acid of 5.4 --> 5.3. Urology consult - 2 French catheter was removed and exchanged for a 22 [place] three-way Foley catheter, irrigated with about a liter and a half of saline and evacuated significant amount of clot, placed on CBI with symptoms improved.  08/12: continue CBI per urology, no plans for cysto at this time. Hgb dropping - 1 unit PRBC and monitor HH      Consultants:  Urology   Procedures/Surgeries: none      ASSESSMENT & PLAN:   Acute kidney injury superimposed on stage 3b chronic kidney disease due to bilateral hydronephrosis and acute urinary retention 2/2 to possible bladder thrombus / clot in Foley, question UTI  Baseline creatinine 2.62 on 08/16/2023.   On admission 08/11, creatinine is 7.03, BUN 52, GFR 8.  Cr 6.62 as of 09/17/23   Treat underlying urological issues as below  IV fluids  Monitor BMP  Possible UTI complicating chronic Foley, acute retention Cefepime  and Flagyl  were started in ED, will change to Rocephin  and follow-up UA and urine  culture. Urology following   Hematuria etiology of hematuria is unclear CT shows large clot within the bladder, clinically significant, causing obstruction/retention, question if consolidated and infected.  CT showed diffuse bladder wall thickening, clinically significant, suspicious for underlying neoplasm Hold Eliquis  Continuous bladder irrigation Urology following holding off cystoscopy for now  1 unit PRBC and monitor HH   ABLA d/t hematuria see above Anemia chronic disease  Treat underlying cause as above  Hold eliquis   Transfuse <7 - 1 unit PRBC and monitor HH   Prostate cancer status post external beam radiation, completed in June 2025.  Chronic urinary retention managed with a Foley catheter. first noted to be in urinary retention in August 2022. He has his catheter exchanged every 4 weeks in the urology clinic.  Acute urinary retention d/t clot Urology follow up  Continue Proscar  and start Flomax    Bladder wall thickening:  f/u urologist recommendation   DVT (deep venous thrombosis)_left leg and pulmonary embolism  Temporarily hold Eliquis    CAD (coronary artery disease):  Troponin 23 --> 26, possibly due to demand ischemia. Continue Lipitor   HTN (hypertension) IV hydralazine  as needed Hold Cozaar  due to worsening renal function   Type II diabetes mellitus with renal manifestations [facility]): Recent A1c 6.3, well-controlled.   Patient is taking Jardiance and metformin SSI   HLD (hyperlipidemia) Lipitor   HIV (human immunodeficiency virus infection) (HCC) Juluca    Elevated lactic acid level:  Lactic acid 5.4  --> 5.3.   leukocytosis with WBC 22.8, but no fever, fever.   Clinically does not seem to have  sepsis.   Likely due to dehydration. IV fluid --> regular diet, can dc fluids if tolerating po intake and Cr improving Trend lactic acid level, WBC, sepsis parameters   Tobacco use Nicotine  patch   Protein-calorie malnutrition, severe underweight based  on BMI: Body mass index is 15.96 kg/m.SABRA Significantly low or high BMI is associated with higher medical risk.  Underweight - under 18  overweight - 25 to 29 obese - 30 or more Class 1 obesity: BMI of 30.0 to 34 Class 2 obesity: BMI of 35.0 to 39 Class 3 obesity: BMI of 40.0 to 49 Super Morbid Obesity: BMI 50-59 Super-super Morbid Obesity: BMI 60+ Healthy nutrition and physical activity advised as adjunct to other disease management and risk reduction treatments Dietician consult     DVT prophylaxis: SCD IV fluids: normal saline continuous IV fluids  Nutrition: regular dier Central lines / other devices: Foley  Code Status: FULL CODE ACP documentation reviewed:  has HCPOA on file in [facility]  TOC needs: expect back to group home when stable Medical barriers to dispo: anemia, ongoing bladder irrigation. Expected medical readiness for discharge next few days / pending clinical course .

## 2023-09-17 NOTE — Progress Notes (Signed)
 Received report, checked in with patient at bedside. CBI flowing and patient denies any needs. Assessment to follow .

## 2023-09-17 NOTE — Progress Notes (Signed)
 Initial Nutrition Assessment  DOCUMENTATION CODES:   Severe malnutrition in context of chronic illness, Underweight  INTERVENTION:   -Continue 1 mg folic acid  daily  -Once diet is advanced:   -Ensure Plus High Protein po BID, each supplement provides 350 kcal and 20 grams of protein  -Magic cup TID with meals, each supplement provides 290 kcal and 9 grams of protein  -MVI with minerals daily   NUTRITION DIAGNOSIS:   Severe Malnutrition related to chronic illness (HIV, dementia) as evidenced by moderate fat depletion, severe fat depletion, moderate muscle depletion, severe muscle depletion, percent weight loss.  GOAL:   Patient will meet greater than or equal to 90% of their needs  MONITOR:   PO intake, Supplement acceptance, Diet advancement  REASON FOR ASSESSMENT:   Consult Assessment of nutrition requirement/status  ASSESSMENT:   Pt with medical history significant of hypertension, hyperlipidemia, diabetes mellitus, COPD, CKD-3B, HIV (CD 258 on 03/29/17 and VL 70 on 04/25/23), HIV dementia HCV (s/p of Harvoni), thrombocytopenia, prostate cancer, indwelling Foley catheter placement, iron  deficiency anemia, DVT/PE on Eliquis , who presents with lower abdominal pain and hematuria.  Pt admitted with AKI superimposed in stage 3 CKD secondary to bilateral hydronephrosis and acute urinary retention secondary to possible bladder thrombus.   Reviewed I/O's: -700 ml x 24 hours  UOP: 1 L x 24 hours  Per urology, plan for clot of back/bladder biopsy. Pt NPO for procedure. Noted CT revealed diffuse bladder wall thickening suspicious for underlying neoplasm.   Pt sitting up in bed at time of visit. Pt is familiar to this RD due to multiple recent admissions. Pt is a resident of [facility].   Pt reports feeling well today. He reports eating and drinking well PTA; has not difficulty chewing and swallowing. Pt understanding of NPO status, but is looking forward to eating when  he can. Pt historically takes supplements well.   Reviewed wt hx; pt has experienced a 22% wt loss over the past 3 months, which is significant for time frame.   Medications reviewed and include vitamin D3, folic acid , lactated ringers  infusion @ 75 ml/hr, and sodium bicarbonate .  Lab Results  Component Value Date   HGBA1C 6.3 (H) 02/21/2023   PTA DM medications are 500 mg metformin daily and 25 mg jardiance daily.   Labs reviewed: CBGS: 129-143 (inpatient orders for glycemic control are 0-5 units insulin  aspart daily at bedtime and 0-9 units insulin  aspart TID with meals).    NUTRITION - FOCUSED PHYSICAL EXAM:  Flowsheet Row Most Recent Value  Orbital Region Moderate depletion  Upper Arm Region Severe depletion  Thoracic and Lumbar Region Severe depletion  Buccal Region Moderate depletion  Temple Region Moderate depletion  Clavicle Bone Region Severe depletion  Clavicle and Acromion Bone Region Severe depletion  Scapular Bone Region Severe depletion  Dorsal Hand Moderate depletion  Patellar Region Moderate depletion  Anterior Thigh Region Moderate depletion  Posterior Calf Region Moderate depletion  Edema (RD Assessment) None  Hair Reviewed  Eyes Reviewed  Mouth Reviewed  Skin Reviewed  Nails Reviewed    Diet Order:   Diet Order             Diet NPO time specified Except for: Sips with Meds, Ice Chips  Diet effective midnight                   EDUCATION NEEDS:   Not appropriate for education at this time  Skin:  Skin Assessment: Skin Integrity Issues: Skin  Integrity Issues:: Other (Comment) Other: wound to mid coccyx  Last BM:  09/16/23  Height:   Ht Readings from Last 1 Encounters:  09/16/23 5' 8 (1.727 m)    Weight:   Wt Readings from Last 1 Encounters:  09/16/23 47.6 kg    Ideal Body Weight:  70 kg  BMI:  Body mass index is 15.96 kg/m.  Estimated Nutritional Needs:   Kcal:  1900-2100  Protein:  105-120 grams  Fluid:  1.9-2.1  L    Margery ORN, RD, LDN, CDCES Registered Dietitian III Certified Diabetes Care and Education Specialist If unable to reach this RD, please use RD Inpatient group chat on secure chat between hours of 8am-4 pm daily

## 2023-09-17 NOTE — Plan of Care (Signed)
  Problem: Nutrition: Goal: Adequate nutrition will be maintained Outcome: Progressing   Problem: Elimination: Goal: Will not experience complications related to bowel motility Outcome: Progressing   Problem: Pain Managment: Goal: General experience of comfort will improve and/or be controlled Outcome: Progressing   Problem: Safety: Goal: Ability to remain free from injury will improve Outcome: Progressing   Problem: Skin Integrity: Goal: Risk for impaired skin integrity will decrease Outcome: Progressing

## 2023-09-18 ENCOUNTER — Inpatient Hospital Stay

## 2023-09-18 DIAGNOSIS — N179 Acute kidney failure, unspecified: Secondary | ICD-10-CM | POA: Diagnosis not present

## 2023-09-18 DIAGNOSIS — N1832 Chronic kidney disease, stage 3b: Secondary | ICD-10-CM | POA: Diagnosis not present

## 2023-09-18 DIAGNOSIS — Z21 Asymptomatic human immunodeficiency virus [HIV] infection status: Secondary | ICD-10-CM | POA: Diagnosis not present

## 2023-09-18 LAB — BASIC METABOLIC PANEL WITH GFR
Anion gap: 9 (ref 5–15)
BUN: 58 mg/dL — ABNORMAL HIGH (ref 8–23)
CO2: 23 mmol/L (ref 22–32)
Calcium: 8.6 mg/dL — ABNORMAL LOW (ref 8.9–10.3)
Chloride: 102 mmol/L (ref 98–111)
Creatinine, Ser: 5.46 mg/dL — ABNORMAL HIGH (ref 0.61–1.24)
GFR, Estimated: 11 mL/min — ABNORMAL LOW
Glucose, Bld: 189 mg/dL — ABNORMAL HIGH (ref 70–99)
Potassium: 4.7 mmol/L (ref 3.5–5.1)
Sodium: 134 mmol/L — ABNORMAL LOW (ref 135–145)

## 2023-09-18 LAB — CBC
HCT: 25.8 % — ABNORMAL LOW (ref 39.0–52.0)
HCT: 29.8 % — ABNORMAL LOW (ref 39.0–52.0)
Hemoglobin: 7.9 g/dL — ABNORMAL LOW (ref 13.0–17.0)
Hemoglobin: 9.2 g/dL — ABNORMAL LOW (ref 13.0–17.0)
MCH: 27 pg (ref 26.0–34.0)
MCH: 27.7 pg (ref 26.0–34.0)
MCHC: 30.6 g/dL (ref 30.0–36.0)
MCHC: 30.9 g/dL (ref 30.0–36.0)
MCV: 88.1 fL (ref 80.0–100.0)
MCV: 89.8 fL (ref 80.0–100.0)
Platelets: 246 10*3/uL (ref 150–400)
Platelets: 220 K/uL (ref 150–400)
RBC: 2.93 MIL/uL — ABNORMAL LOW (ref 4.22–5.81)
RBC: 3.32 MIL/uL — ABNORMAL LOW (ref 4.22–5.81)
RDW: 16.7 % — ABNORMAL HIGH (ref 11.5–15.5)
RDW: 15.9 % — ABNORMAL HIGH (ref 11.5–15.5)
WBC: 18.7 10*3/uL — ABNORMAL HIGH (ref 4.0–10.5)
WBC: 15.7 K/uL — ABNORMAL HIGH (ref 4.0–10.5)
nRBC: 0 % (ref 0.0–0.2)
nRBC: 0 % (ref 0.0–0.2)

## 2023-09-18 LAB — GLUCOSE, CAPILLARY
Glucose-Capillary: 212 mg/dL — ABNORMAL HIGH (ref 70–99)
Glucose-Capillary: 231 mg/dL — ABNORMAL HIGH (ref 70–99)
Glucose-Capillary: 164 mg/dL — ABNORMAL HIGH (ref 70–99)
Glucose-Capillary: 223 mg/dL — ABNORMAL HIGH (ref 70–99)

## 2023-09-18 LAB — PREPARE RBC (CROSSMATCH)

## 2023-09-18 MED ORDER — SODIUM CHLORIDE 0.9% IV SOLUTION: Freq: Once | INTRAVENOUS | Status: AC

## 2023-09-18 NOTE — Progress Notes (Signed)
 PT Cancellation Note  Patient Details Name: Jay Flores MRN: [id number] DOB: 08-01-1960   Cancelled Treatment:    Reason Eval/Treat Not Completed: Other (comment). Pt on CBI, PT to hold at this time and re-attempt as able.    Doyal Shams PT, DPT 8:40 AM,09/18/23

## 2023-09-18 NOTE — Progress Notes (Signed)
 PROGRESS NOTE Jay Flores    DOB: Apr 03, 1960, 63 y.o.  FMW:[id number]    Code Status: Full Code   DOA: 09/16/2023   LOS: 2  Brief hospital course  Jay Flores is a 63 y.o. male, group home resident,  with medical history significant of hypertension, hyperlipidemia, diabetes mellitus, COPD, CKD-3B, HIV (CD 258 on 03/29/17 and VL 70 on 04/25/23), HIV dementia HCV (s/p of Harvoni), thrombocytopenia, prostate cancer, indwelling Foley catheter placement, iron  deficiency anemia, DVT/PE on Eliquis , who presents to ED  08/11 with lower abdominal pain and hematuria. 08/11: to ED, CT (+)large distended bladder with superior bladder wall diverticulum, progressive or worsening bilateral hydroureteronephrosis. WBC 22.8, worsening renal function, troponin 23 --> 24, lactic acid of 5.4 --> 5.3. Urology consult - 5 French catheter was removed and exchanged for a 22 [place] three-way Foley catheter, irrigated with about a liter and a half of saline and evacuated significant amount of clot, placed on CBI with symptoms improved.   09/18/23 -urine has gradually been clearing. Abdominal pain is resolved. Hgb stable but under goal of 8. Transfusing 2nd unit today.   Assessment & Plan  Principal Problem:   Acute kidney injury superimposed on stage 3b chronic kidney disease (HCC) Active Problems:   Bilateral hydronephrosis   Acute urinary retention   Bladder wall thickening   DVT (deep venous thrombosis)_left leg   Pulmonary embolism (HCC)   CAD (coronary artery disease)   HTN (hypertension)   Type II diabetes mellitus with renal manifestations (HCC)   HLD (hyperlipidemia)   HIV (human immunodeficiency virus infection) (HCC)   Elevated lactic acid level   Anemia in chronic kidney disease (CKD)   Tobacco use   Protein-calorie malnutrition, severe  AKI superimposed on stage 3b chronic kidney disease with bilateral hydronephrosis due to acute urinary retention 2/2 to possible bladder thrombus / clot in  Foley, question UTI  Baseline creatinine 2.62 on 08/16/2023.  Since admission has trended 7.03>>> 5.46 s/p foley replacement.  - urology following.  - continue foley  - renal US  today - continue IV Abx, UxCx positive for klebsiella pneumoniae. Follow for sensitivities  - monitor BMP. If not continuing to trend toward baseline after obstruction has been cleared, consult nephrology.     Hematuria etiology of hematuria is unclear CT shows large clot within the bladder, clinically significant, causing obstruction/retention, question if consolidated and infected.  CT showed diffuse bladder wall thickening, clinically significant, suspicious for underlying neoplasm Hold Eliquis , restart when hgb stable Continuous bladder irrigation Urology following holding off cystoscopy until outpatient, per urology   ABLA d/t hematuria see above Anemia chronic disease- hgb goal 8 or greater.  Treat underlying cause as above  Hold eliquis   Transfuse <2nd u pRBCs today with post- tfx CBC   Prostate cancer status post external beam radiation, completed in June 2025.  Chronic urinary retention managed with a Foley catheter. first noted to be in urinary retention in August 2022. He has his catheter exchanged every 4 weeks in the urology clinic.  Acute urinary retention d/t clot Urology follow up  Continue Proscar  and Flomax     DVT left leg and pulmonary embolism  Temporarily hold Eliquis    CAD  HLD Troponin 23 --> 26, possibly due to demand ischemia. Continue Lipitor   HTN Hold Cozaar  due to worsening renal function. BP has maintained WNL   Type II diabetes mellitus with renal manifestations [facility]): Recent A1c 6.3, well-controlled.   Patient is taking Jardiance and metformin SSI  HIV (human immunodeficiency virus infection) (HCC) Continue Juluca     Tobacco use Nicotine  patch  Body mass index is 15.96 kg/m.  VTE ppx: Place and maintain sequential compression device Start: 09/17/23 1256 SCDs  Start: 09/16/23 2126  Diet:     Diet   Diet regular Room service appropriate? Yes; Fluid consistency: Thin   Consultants: Urology   Subjective 09/18/23    Pt reports feeling better today. Agreeable to blood transfusion. Denies current abdominal pain   Objective  Blood pressure 136/83, pulse 73, temperature (!) 97.3 F (36.3 C), resp. rate 18, height 5' 8 (1.727 m), weight 47.6 kg, SpO2 100%.  Intake/Output Summary (Last 24 hours) at 09/18/2023 0748 Last data filed at 09/18/2023 0730 Gross per 24 hour  Intake 10340.69 ml  Output 87699 ml  Net -1959.31 ml   Filed Weights   09/16/23 1345  Weight: 47.6 kg     Physical Exam:  General: awake, alert, NAD, frail HEENT: atraumatic, clear conjunctiva, anicteric sclera, MMM, hearing grossly normal Respiratory: normal respiratory effort. Cardiovascular: quick capillary refill Gastrointestinal: soft, NT, ND Nervous: A&O x3. no gross focal neurologic deficits, normal speech Extremities: moves all equally, no edema, normal tone Skin: dry, intact, normal temperature, normal color. No rashes, lesions or ulcers on exposed skin Psychiatry: normal mood, congruent affect  Labs   I have personally reviewed the following labs and imaging studies CBC    Component Value Date/Time   WBC 18.7 (H) 09/18/2023 0438   RBC 2.93 (L) 09/18/2023 0438   HGB 7.9 (L) 09/18/2023 0438   HGB 9.3 (L) 05/30/2023 1025   HGB 8.2 (L) 04/25/2023 0709   HCT 25.8 (L) 09/18/2023 0438   HCT 24.7 (L) 04/25/2023 0709   PLT 246 09/18/2023 0438   PLT 174 05/30/2023 1025   PLT 128 (L) 04/25/2023 0709   MCV 88.1 09/18/2023 0438   MCV 96 04/25/2023 0709   MCH 27.0 09/18/2023 0438   MCHC 30.6 09/18/2023 0438   RDW 16.7 (H) 09/18/2023 0438   RDW 13.9 04/25/2023 0709   LYMPHSABS 1.3 08/22/2023 1533   LYMPHSABS 1.7 04/25/2023 0709   MONOABS 0.3 08/22/2023 1533   EOSABS 0.1 08/22/2023 1533   EOSABS 0.1 04/25/2023 0709   BASOSABS 0.0 08/22/2023 1533   BASOSABS  0.0 04/25/2023 0709      Latest Ref Rng & Units 09/18/2023    4:38 AM 09/17/2023    4:33 AM 09/16/2023    2:23 PM  BMP  Glucose 70 - 99 mg/dL 810  876  809   BUN 8 - 23 mg/dL 58  54  52   Creatinine 0.61 - 1.24 mg/dL 4.53  3.37  2.96   Sodium 135 - 145 mmol/L 134  136  130   Potassium 3.5 - 5.1 mmol/L 4.7  5.1  5.1   Chloride 98 - 111 mmol/L 102  104  97   CO2 22 - 32 mmol/L 23  20  20    Calcium  8.9 - 10.3 mg/dL 8.6  9.1  9.5     CT ABDOMEN PELVIS WO CONTRAST Result Date: 09/16/2023 CLINICAL DATA:  Chest and abdominal pain EXAM: CT ABDOMEN AND PELVIS WITHOUT CONTRAST TECHNIQUE: Multidetector CT imaging of the abdomen and pelvis was performed following the standard protocol without IV contrast. RADIATION DOSE REDUCTION: This exam was performed according to the departmental dose-optimization program which includes automated exposure control, adjustment of the mA and/or kV according to patient size and/or use of iterative reconstruction technique. COMPARISON:  08/16/2023 FINDINGS:  Lower chest: Mild scarring is noted in the right lung base. Hepatobiliary: Cholelithiasis is seen without complicating factors. The liver appears within normal limits. Pancreas: Scattered calcifications are noted throughout the pancreas consistent with chronic pancreatitis. The previously seen cystic change adjacent to the head of the pancreas and second portion of the duodenum is again noted and stable measuring approximately 2 cm. The more peripheral pancreas appears within normal limits. Spleen: Normal in size without focal abnormality. Adrenals/Urinary Tract: Adrenal glands are within normal limits. Persistent hydronephrosis and hydroureter is noted bilateral likely related to an over distended bladder. Foley catheter is noted in place there is considerable mixed attenuation density within the bladder lumen consistent with air, urine and likely thrombus. Persistent bladder wall thickening is again noted with evidence of a  superior bladder diverticulum. These changes likely contribute to the degree of hydronephrosis. Stomach/Bowel: Scattered fecal material is noted throughout the colon. No obstructive or inflammatory changes of the colon are seen. The appendix is partially visualized and within normal limits. Small bowel and stomach appear within normal limits. Vascular/Lymphatic: Aortic atherosclerosis. No enlarged abdominal or pelvic lymph nodes. Previously seen lymph node adjacent to the rectum is no longer identified. Reproductive: Prostate is unremarkable. Other: No abdominal wall hernia or abnormality. No abdominopelvic ascites. Musculoskeletal: Bilateral L5 pars defects are noted without significant anterolisthesis. No acute bony abnormality is noted. 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 Mixed echogenicity material is noted within the bladder representing air, urine and likely hyperdense thrombus. Correlate with Foley output. The over distention of the bladder contributes to the bilateral hydronephrosis which is similar to that seen on the prior exam. Diffuse bladder wall thickening is noted suspicious for underlying neoplasm. Correlate with clinical history. Cholelithiasis without complicating factors. Chronic pancreatitis with a 2 cm pancreatic pseudocyst in the region of the head stable from the prior study. Electronically Signed   By: Oneil Devonshire M.D.   On: 09/16/2023 20:25   DG Chest 1 View Result Date: 09/16/2023 CLINICAL DATA:  Chest pain. EXAM: CHEST  1 VIEW COMPARISON:  July 29, 2023 FINDINGS: The heart size and mediastinal contours are within normal limits. Mild, stable linear scarring and/or atelectasis is seen along the periphery of the right lung base. No acute infiltrate, pleural effusion or pneumothorax is identified. The visualized skeletal structures are unremarkable. IMPRESSION: Mild, stable right basilar linear scarring and/or atelectasis. Electronically Signed    By: Suzen Dials M.D.   On: 09/16/2023 14:51    Disposition Plan & Communication  Patient status: Inpatient  Admitted From: Home Planned disposition location: Home Anticipated discharge date: 8/14 pending hgb stability   Family Communication: none at bedside    Author: Marien LITTIE Piety, DO [facility] 09/18/2023, 7:48 AM   Available by Epic secure chat 7AM-7PM. If 7PM-7AM, please contact night-coverage.  [facility] contact information found on [email or web address].

## 2023-09-18 NOTE — Progress Notes (Signed)
 Urology Inpatient Progress Note  Subjective: No acute events overnight. He is afebrile, VSS. Hemoglobin stable, 7.9.  White count down, 18.7.  Creatinine down, 5.46. Blood cultures pending with no growth at 2 days, urine culture pending.  On antibiotics as below. Foley catheter in place draining light pink urine on slow drip CBI. He reports new left sided abdominal pain that starts at the mid-axillary line and radiates toward the LLQ.  Anti-infectives: Anti-infectives (From admission, onward)    Start     Dose/Rate Route Frequency Ordered Stop   09/17/23 1000  dolutegravir -rilpivirine  (JULUCA ) 50-25 MG per tablet 1 tablet        1 tablet Oral Daily 09/17/23 0147     09/17/23 0600  cefTRIAXone  (ROCEPHIN ) 2 g in sodium chloride  0.9 % 100 mL IVPB        2 g 200 mL/hr over 30 Minutes Intravenous Every 24 hours 09/16/23 2207     09/16/23 1645  ceFEPIme  (MAXIPIME ) 2 g in sodium chloride  0.9 % 100 mL IVPB        2 g 200 mL/hr over 30 Minutes Intravenous  Once 09/16/23 1639 09/16/23 1840   09/16/23 1645  metroNIDAZOLE  (FLAGYL ) IVPB 500 mg        500 mg 100 mL/hr over 60 Minutes Intravenous  Once 09/16/23 1639 09/16/23 1840       Current Facility-Administered Medications  Medication Dose Route Frequency Provider Last Rate Last Admin   acetaminophen  (TYLENOL ) tablet 650 mg  650 mg Oral Q6H PRN Niu, Xilin, MD       albuterol  (PROVENTIL ) (2.5 MG/3ML) 0.083% nebulizer solution 2.5 mg  2.5 mg Inhalation Q4H PRN Niu, Xilin, MD       atorvastatin  (LIPITOR) tablet 20 mg  20 mg Oral QHS Niu, Xilin, MD   20 mg at 09/17/23 2240   cefTRIAXone  (ROCEPHIN ) 2 g in sodium chloride  0.9 % 100 mL IVPB  2 g Intravenous Q24H Niu, Xilin, MD 200 mL/hr at 09/18/23 0606 2 g at 09/18/23 0606   Chlorhexidine  Gluconate Cloth 2 % PADS 6 each  6 each Topical Daily Alexander, Natalie, DO       cholecalciferol  (VITAMIN D3) 25 MCG (1000 UNIT) tablet 1,000 Units  1,000 Units Oral QHS Niu, Xilin, MD   1,000 Units at 09/17/23  2239   dextromethorphan-guaiFENesin  (MUCINEX  DM) 30-600 MG per 12 hr tablet 1 tablet  1 tablet Oral BID PRN Niu, Xilin, MD       dolutegravir -rilpivirine  (JULUCA ) 50-25 MG per tablet 1 tablet  1 tablet Oral Daily Niu, Xilin, MD   1 tablet at 09/17/23 0848   feeding supplement (ENSURE PLUS HIGH PROTEIN) liquid 237 mL  237 mL Oral BID BM Alexander, Natalie, DO       finasteride  (PROSCAR ) tablet 5 mg  5 mg Oral Daily Niu, Xilin, MD   5 mg at 09/18/23 0900   folic acid  (FOLVITE ) tablet 1 mg  1 mg Oral Daily Niu, Xilin, MD   1 mg at 09/18/23 0900   hydrALAZINE  (APRESOLINE ) injection 5 mg  5 mg Intravenous Q2H PRN Niu, Xilin, MD       insulin  aspart (novoLOG ) injection 0-5 Units  0-5 Units Subcutaneous QHS Niu, Xilin, MD       insulin  aspart (novoLOG ) injection 0-9 Units  0-9 Units Subcutaneous TID WC Niu, Xilin, MD   3 Units at 09/18/23 0849   loperamide  (IMODIUM ) capsule 2 mg  2 mg Oral QID PRN Niu, Xilin, MD  metoprolol  succinate (TOPROL -XL) 24 hr tablet 100 mg  100 mg Oral Daily Niu, Xilin, MD   100 mg at 09/18/23 0900   multivitamin with minerals tablet 1 tablet  1 tablet Oral Daily Alexander, Natalie, DO       nicotine  (NICODERM CQ  - dosed in mg/24 hours) patch 21 mg  21 mg Transdermal Daily Niu, Xilin, MD   21 mg at 09/18/23 0900   ondansetron  (ZOFRAN ) injection 4 mg  4 mg Intravenous Q8H PRN Niu, Xilin, MD       oxyCODONE  (Oxy IR/ROXICODONE ) immediate release tablet 5 mg  5 mg Oral Q6H PRN Niu, Xilin, MD   5 mg at 09/18/23 9381   sodium bicarbonate  tablet 650 mg  650 mg Oral BID Niu, Xilin, MD   650 mg at 09/18/23 0900   sodium chloride  irrigation 0.9 % 3,000 mL  3,000 mL Irrigation Continuous Funke, Mary E, MD   3,000 mL at 09/16/23 2207   tamsulosin  (FLOMAX ) capsule 0.4 mg  0.4 mg Oral QPC supper Niu, Xilin, MD   0.4 mg at 09/17/23 1728   Vilazodone  HCl TABS 20 mg  20 mg Oral Daily Niu, Xilin, MD   20 mg at 09/17/23 0848     Objective: Vital signs in last 24 hours: Temp:  [97.3 F  (36.3 C)-98.5 F (36.9 C)] 98 F (36.7 C) (08/13 0751) Pulse Rate:  [70-77] 76 (08/13 0751) Resp:  [16-18] 18 (08/13 0751) BP: (102-136)/(65-83) 102/65 (08/13 0751) SpO2:  [100 %] 100 % (08/13 0751)  Intake/Output from previous day: 08/12 0701 - 08/13 0700 In: 10340.7 [P.O.:240; I.V.:47.3; Blood:252; IV Piggyback:101.4] Out: 89099 [Urine:10900] Intake/Output this shift: Total I/O In: -  Out: 3050 [Urine:3050]  Physical Exam Vitals and nursing note reviewed.  Constitutional:      General: He is not in acute distress.    Appearance: He is not ill-appearing, toxic-appearing or diaphoretic.  HENT:     Head: Normocephalic and atraumatic.  Pulmonary:     Effort: Pulmonary effort is normal. No respiratory distress.  Abdominal:     General: There is no distension.     Tenderness: There is no abdominal tenderness. There is no guarding or rebound.  Skin:    General: Skin is warm and dry.  Neurological:     Mental Status: He is alert and oriented to person, place, and time.  Psychiatric:        Mood and Affect: Mood normal.        Behavior: Behavior normal.    Lab Results:  Recent Labs    09/17/23 0433 09/17/23 1934 09/18/23 0438  WBC 20.2*  --  18.7*  HGB 6.4* 7.9* 7.9*  HCT 22.2* 27.1* 25.8*  PLT 241  --  246   BMET Recent Labs    09/17/23 0433 09/18/23 0438  NA 136 134*  K 5.1 4.7  CL 104 102  CO2 20* 23  GLUCOSE 123* 189*  BUN 54* 58*  CREATININE 6.62* 5.46*  CALCIUM  9.1 8.6*   PT/INR Recent Labs    09/16/23 2308  LABPROT 27.8*  INR 2.5*   Studies/Results: CT ABDOMEN PELVIS WO CONTRAST Result Date: 09/16/2023 CLINICAL DATA:  Chest and abdominal pain EXAM: CT ABDOMEN AND PELVIS WITHOUT CONTRAST TECHNIQUE: Multidetector CT imaging of the abdomen and pelvis was performed following the standard protocol without IV contrast. RADIATION DOSE REDUCTION: This exam was performed according to the departmental dose-optimization program which includes automated  exposure control, adjustment of the mA and/or kV according  to patient size and/or use of iterative reconstruction technique. COMPARISON:  08/16/2023 FINDINGS: Lower chest: Mild scarring is noted in the right lung base. Hepatobiliary: Cholelithiasis is seen without complicating factors. The liver appears within normal limits. Pancreas: Scattered calcifications are noted throughout the pancreas consistent with chronic pancreatitis. The previously seen cystic change adjacent to the head of the pancreas and second portion of the duodenum is again noted and stable measuring approximately 2 cm. The more peripheral pancreas appears within normal limits. Spleen: Normal in size without focal abnormality. Adrenals/Urinary Tract: Adrenal glands are within normal limits. Persistent hydronephrosis and hydroureter is noted bilateral likely related to an over distended bladder. Foley catheter is noted in place there is considerable mixed attenuation density within the bladder lumen consistent with air, urine and likely thrombus. Persistent bladder wall thickening is again noted with evidence of a superior bladder diverticulum. These changes likely contribute to the degree of hydronephrosis. Stomach/Bowel: Scattered fecal material is noted throughout the colon. No obstructive or inflammatory changes of the colon are seen. The appendix is partially visualized and within normal limits. Small bowel and stomach appear within normal limits. Vascular/Lymphatic: Aortic atherosclerosis. No enlarged abdominal or pelvic lymph nodes. Previously seen lymph node adjacent to the rectum is no longer identified. Reproductive: Prostate is unremarkable. Other: No abdominal wall hernia or abnormality. No abdominopelvic ascites. Musculoskeletal: Bilateral L5 pars defects are noted without significant anterolisthesis. No acute bony abnormality is noted. 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 Mixed echogenicity  material is noted within the bladder representing air, urine and likely hyperdense thrombus. Correlate with Foley output. The over distention of the bladder contributes to the bilateral hydronephrosis which is similar to that seen on the prior exam. Diffuse bladder wall thickening is noted suspicious for underlying neoplasm. Correlate with clinical history. Cholelithiasis without complicating factors. Chronic pancreatitis with a 2 cm pancreatic pseudocyst in the region of the head stable from the prior study. Electronically Signed   By: Oneil Devonshire M.D.   On: 09/16/2023 20:25   DG Chest 1 View Result Date: 09/16/2023 CLINICAL DATA:  Chest pain. EXAM: CHEST  1 VIEW COMPARISON:  July 29, 2023 FINDINGS: The heart size and mediastinal contours are within normal limits. Mild, stable linear scarring and/or atelectasis is seen along the periphery of the right lung base. No acute infiltrate, pleural effusion or pneumothorax is identified. The visualized skeletal structures are unremarkable. IMPRESSION: Mild, stable right basilar linear scarring and/or atelectasis. Electronically Signed   By: Suzen Dials M.D.   On: 09/16/2023 14:51   Assessment & Plan: 63 y.o. male with PMH prostate cancer s/p IMRT on 6 months of ADT, BPH with urinary retention on finasteride  managed with chronic Foley catheter, and a recent history of intermittent gross hematuria now admitted with gross hematuria with clot retention and AKI with bilateral hydronephrosis.  Blood counts are stable, renal function is slowly improving, and efflux is reasonably clear on slow drip CBI.  That said, with new reports of left-sided abdominal pain, I question possible well-organized clot in the bladder that is impeding drainage and causing persistent hydronephrosis.  I have ordered a renal ultrasound for today.  If he has residual clot, we may consider Claudie back in the OR tomorrow.  Regardless, no plans for operative intervention today, so he may  continue to have a diet.  Armetta Henri, PA-C 09/18/2023

## 2023-09-18 NOTE — Progress Notes (Signed)
 [name] Brain, nurse at [facility] group home where patient lives called for update. She stated she will be the contact person to notify when patient discharges. Contact information in patients chart.

## 2023-09-19 ENCOUNTER — Inpatient Hospital Stay: Admitting: Anesthesiology

## 2023-09-19 ENCOUNTER — Encounter
Admission: EM | Disposition: A | Discharge status: Home / Self Care | Payer: Self-pay | Source: Ambulatory Visit | Attending: Student in an Organized Health Care Education/Training Program

## 2023-09-19 ENCOUNTER — Inpatient Hospital Stay

## 2023-09-19 DIAGNOSIS — N139 Obstructive and reflux uropathy, unspecified: Secondary | ICD-10-CM | POA: Diagnosis not present

## 2023-09-19 DIAGNOSIS — R310 Gross hematuria: Secondary | ICD-10-CM | POA: Diagnosis not present

## 2023-09-19 DIAGNOSIS — N179 Acute kidney failure, unspecified: Secondary | ICD-10-CM | POA: Diagnosis not present

## 2023-09-19 DIAGNOSIS — N1832 Chronic kidney disease, stage 3b: Secondary | ICD-10-CM | POA: Diagnosis not present

## 2023-09-19 DIAGNOSIS — N1330 Unspecified hydronephrosis: Secondary | ICD-10-CM | POA: Diagnosis not present

## 2023-09-19 DIAGNOSIS — C679 Malignant neoplasm of bladder, unspecified: Secondary | ICD-10-CM | POA: Diagnosis not present

## 2023-09-19 DIAGNOSIS — R338 Other retention of urine: Secondary | ICD-10-CM | POA: Diagnosis not present

## 2023-09-19 HISTORY — PX: CYSTOSCOPY: SHX5120

## 2023-09-19 HISTORY — PX: TRANSURETHRAL RESECTION OF BLADDER TUMOR: SHX2575

## 2023-09-19 LAB — BPAM RBC
Blood Product Expiration Date: 202509112359
Blood Product Expiration Date: 202509112359
ISSUE DATE / TIME: 202508121521
ISSUE DATE / TIME: 202508131208
Unit Type and Rh: 6200
Unit Type and Rh: 6200

## 2023-09-19 LAB — TYPE AND SCREEN
ABO/RH(D): A POS
Antibody Screen: NEGATIVE
Unit division: 0
Unit division: 0

## 2023-09-19 LAB — BASIC METABOLIC PANEL WITH GFR
Anion gap: 9 (ref 5–15)
BUN: 56 mg/dL — ABNORMAL HIGH (ref 8–23)
CO2: 22 mmol/L (ref 22–32)
Calcium: 8.7 mg/dL — ABNORMAL LOW (ref 8.9–10.3)
Chloride: 105 mmol/L (ref 98–111)
Creatinine, Ser: 4.49 mg/dL — ABNORMAL HIGH (ref 0.61–1.24)
GFR, Estimated: 14 mL/min — ABNORMAL LOW (ref >60)
Glucose, Bld: 191 mg/dL — ABNORMAL HIGH (ref 70–99)
Potassium: 4.8 mmol/L (ref 3.5–5.1)
Sodium: 136 mmol/L (ref 135–145)

## 2023-09-19 LAB — CBC
HCT: 28.7 % — ABNORMAL LOW (ref 39.0–52.0)
Hemoglobin: 9.2 g/dL — ABNORMAL LOW (ref 13.0–17.0)
MCH: 27.7 pg (ref 26.0–34.0)
MCHC: 32.1 g/dL (ref 30.0–36.0)
MCV: 86.4 fL (ref 80.0–100.0)
Platelets: 204 K/uL (ref 150–400)
RBC: 3.32 MIL/uL — ABNORMAL LOW (ref 4.22–5.81)
RDW: 15.9 % — ABNORMAL HIGH (ref 11.5–15.5)
WBC: 13.8 K/uL — ABNORMAL HIGH (ref 4.0–10.5)
nRBC: 0 % (ref 0.0–0.2)

## 2023-09-19 LAB — GLUCOSE, CAPILLARY
Glucose-Capillary: 197 mg/dL — ABNORMAL HIGH (ref 70–99)
Glucose-Capillary: 170 mg/dL — ABNORMAL HIGH (ref 70–99)
Glucose-Capillary: 155 mg/dL — ABNORMAL HIGH (ref 70–99)
Glucose-Capillary: 195 mg/dL — ABNORMAL HIGH (ref 70–99)

## 2023-09-19 SURGERY — TURBT (TRANSURETHRAL RESECTION OF BLADDER TUMOR)
Anesthesia: General

## 2023-09-19 MED ORDER — LIDOCAINE HCL (CARDIAC) PF 100 MG/5ML IV SOSY
PREFILLED_SYRINGE | INTRAVENOUS | Status: DC | PRN
  Administered 2023-09-19: 60 mg via INTRAVENOUS

## 2023-09-19 MED ORDER — CEFAZOLIN SODIUM-DEXTROSE 1-4 GM/50ML-% IV SOLN
1.0000 g | Freq: Two times a day (BID) | INTRAVENOUS | Status: DC
  Administered 2023-09-20: 1 g via INTRAVENOUS
  Filled 2023-09-19 (×2): qty 50

## 2023-09-19 MED ORDER — ONDANSETRON HCL 4 MG/2ML IJ SOLN
INTRAMUSCULAR | Status: DC | PRN
  Administered 2023-09-19: 4 mg via INTRAVENOUS

## 2023-09-19 MED ORDER — FENTANYL CITRATE (PF) 100 MCG/2ML IJ SOLN
INTRAMUSCULAR | Status: AC
  Filled 2023-09-19: qty 2

## 2023-09-19 MED ORDER — SODIUM CHLORIDE 0.9 % IR SOLN
Status: DC | PRN
  Administered 2023-09-19 (×2): 3000 mL

## 2023-09-19 MED ORDER — FENTANYL CITRATE (PF) 100 MCG/2ML IJ SOLN: 25.0000 ug | INTRAMUSCULAR | Status: DC | PRN

## 2023-09-19 MED ORDER — CEFAZOLIN SODIUM-DEXTROSE 1-4 GM/50ML-% IV SOLN
INTRAVENOUS | Status: DC | PRN
  Administered 2023-09-19: 1 g via INTRAVENOUS

## 2023-09-19 MED ORDER — OXYCODONE HCL 5 MG PO TABS: 5.0000 mg | ORAL_TABLET | Freq: Once | ORAL | Status: DC | PRN

## 2023-09-19 MED ORDER — HYDROGEN PEROXIDE 3 % EX SOLN
Freq: Once | CUTANEOUS | Status: AC
  Administered 2023-09-19: 1 via TOPICAL
  Filled 2023-09-19: qty 473

## 2023-09-19 MED ORDER — OXYCODONE HCL 5 MG/5ML PO SOLN: 5.0000 mg | Freq: Once | ORAL | Status: DC | PRN

## 2023-09-19 MED ORDER — ONDANSETRON HCL 4 MG/2ML IJ SOLN
INTRAMUSCULAR | Status: AC
  Filled 2023-09-19: qty 2

## 2023-09-19 MED ORDER — DROPERIDOL 2.5 MG/ML IJ SOLN: 0.6250 mg | Freq: Once | INTRAMUSCULAR | Status: DC | PRN

## 2023-09-19 MED ORDER — SODIUM CHLORIDE 0.9 % IV SOLN: INTRAVENOUS | Status: DC | PRN

## 2023-09-19 MED ORDER — FENTANYL CITRATE (PF) 100 MCG/2ML IJ SOLN
INTRAMUSCULAR | Status: DC | PRN
  Administered 2023-09-19 (×2): 25 ug via INTRAVENOUS

## 2023-09-19 MED ORDER — PHENYLEPHRINE 80 MCG/ML (10ML) SYRINGE FOR IV PUSH (FOR BLOOD PRESSURE SUPPORT)
PREFILLED_SYRINGE | INTRAVENOUS | Status: DC | PRN
  Administered 2023-09-19 (×3): 160 ug via INTRAVENOUS

## 2023-09-19 MED ORDER — DEXAMETHASONE SODIUM PHOSPHATE 10 MG/ML IJ SOLN
INTRAMUSCULAR | Status: AC
  Filled 2023-09-19: qty 1

## 2023-09-19 MED ORDER — ACETAMINOPHEN 10 MG/ML IV SOLN: 1000.0000 mg | Freq: Once | INTRAVENOUS | Status: DC | PRN

## 2023-09-19 MED ORDER — PROPOFOL 10 MG/ML IV BOLUS
INTRAVENOUS | Status: DC | PRN
  Administered 2023-09-19 (×2): 50 mg via INTRAVENOUS

## 2023-09-19 MED ORDER — DEXAMETHASONE SODIUM PHOSPHATE 10 MG/ML IJ SOLN
INTRAMUSCULAR | Status: DC | PRN
  Administered 2023-09-19: 5 mg via INTRAVENOUS

## 2023-09-19 SURGICAL SUPPLY — 19 items
BAG DRAIN SIEMENS DORNER NS (MISCELLANEOUS) ×3 IMPLANT
BAG URO DRAIN 4000ML (MISCELLANEOUS) ×2 IMPLANT
BRUSH SCRUB EZ 4% CHG (MISCELLANEOUS) ×3 IMPLANT
CATH FOLEY 3WAY 30CC 22FR (CATHETERS) ×2 IMPLANT
DRSG TELFA 3X4 N-ADH STERILE (GAUZE/BANDAGES/DRESSINGS) ×2 IMPLANT
GLOVE BIOGEL PI IND STRL 7.5 (GLOVE) ×3 IMPLANT
GOWN STRL REUS W/ TWL LRG LVL3 (GOWN DISPOSABLE) ×3 IMPLANT
GOWN STRL REUS W/ TWL XL LVL3 (GOWN DISPOSABLE) ×3 IMPLANT
GUIDEWIRE STR DUAL SENSOR (WIRE) ×2 IMPLANT
KIT TURNOVER CYSTO (KITS) ×3 IMPLANT
LOOP CUT BIPOLAR 24F LRG (ELECTROSURGICAL) ×2 IMPLANT
PACK CYSTO AR (MISCELLANEOUS) ×3 IMPLANT
SET CYSTO W/LG BORE CLAMP LF (SET/KITS/TRAYS/PACK) ×3 IMPLANT
SOL .9 NS 3000ML IRR UROMATIC (IV SOLUTION) ×3 IMPLANT
STENT URET 6FRX24 CONTOUR (STENTS) IMPLANT
STENT URET 6FRX26 CONTOUR (STENTS) IMPLANT
SURGILUBE 2OZ TUBE FLIPTOP (MISCELLANEOUS) ×3 IMPLANT
WATER STERILE IRR 1000ML POUR (IV SOLUTION) ×3 IMPLANT
WATER STERILE IRR 500ML POUR (IV SOLUTION) ×3 IMPLANT

## 2023-09-19 NOTE — Progress Notes (Signed)
 Patient brought back to unit by PACU nurse on bed. He is alert and orientated. Continue on bladder irrigation. Made comfortable in bed, call bell given

## 2023-09-19 NOTE — Progress Notes (Signed)
 PROGRESS NOTE Jay Flores    DOB: March 26, 1960, 63 y.o.  FMW:[id number]    Code Status: Full Code   DOA: 09/16/2023   LOS: 3  Brief hospital course  Jay Flores is a 63 y.o. male, group home resident,  with medical history significant of hypertension, hyperlipidemia, diabetes mellitus, COPD, CKD-3B, HIV (CD 258 on 03/29/17 and VL 70 on 04/25/23), HIV dementia HCV (s/p of Harvoni), thrombocytopenia, prostate cancer, indwelling Foley catheter placement, iron  deficiency anemia, DVT/PE on Eliquis , who presents to ED  08/11 with lower abdominal pain and hematuria. 08/11: to ED, CT (+)large distended bladder with superior bladder wall diverticulum, progressive or worsening bilateral hydroureteronephrosis. WBC 22.8, worsening renal function, troponin 23 --> 24, lactic acid of 5.4 --> 5.3. Urology consult - 83 French catheter was removed and exchanged for a 22 [place] three-way Foley catheter, irrigated with about a liter and a half of saline and evacuated significant amount of clot, placed on CBI with symptoms improved.   09/19/23 -urine has gradually been clearing. Abdominal pain is resolved. Hgb stable but under goal of 8. Transfusing 2nd unit today.   Assessment & Plan  Principal Problem:   Acute kidney injury superimposed on stage 3b chronic kidney disease (HCC) Active Problems:   Bilateral hydronephrosis   Acute urinary retention   Bladder wall thickening   DVT (deep venous thrombosis)_left leg   Pulmonary embolism (HCC)   CAD (coronary artery disease)   HTN (hypertension)   Type II diabetes mellitus with renal manifestations (HCC)   HLD (hyperlipidemia)   HIV (human immunodeficiency virus infection) (HCC)   Elevated lactic acid level   Anemia in chronic kidney disease (CKD)   Tobacco use   Protein-calorie malnutrition, severe  AKI superimposed on stage 3b chronic kidney disease with bilateral hydronephrosis due to acute urinary retention 2/2 to possible bladder thrombus / clot in  Foley, question UTI. Renal US  positive for: 1. Bilateral echogenic kidneys which may represent sequelae associated with medical renal disease. 2. Small left renal cyst. 3. Bilateral hydronephrosis, right greater than left. Baseline creatinine 2.62 on 08/16/2023.  Since admission has trended 7.03>>> 4.49 s/p foley replacement.  - urology following.  - continue foley  - cystoscopy with clot evacuation today. Possible biopsy and further interventions as needed once assessed in OR.  - continue IV Abx, UxCx positive for klebsiella pneumoniae- transition CTX to cefazolin  per sensitivities guide  - monitor BMP. If not continuing to trend toward baseline after obstruction has been cleared, consult nephrology.     Hematuria etiology of hematuria is unclear CT shows large clot within the bladder, clinically significant, causing obstruction/retention, question if consolidated and infected.  CT showed diffuse bladder wall thickening, clinically significant, suspicious for underlying neoplasm Hold Eliquis , restart when hgb stable. Consider restart eliquis  tomorrow if hgb remains stable and no further procedures planned by urology  Continuous bladder irrigation Urology following- procedures today as above Continue foley   ABLA d/t hematuria see above Anemia chronic disease- hgb goal 8 or greater.  Treat underlying cause as above  Hold eliquis   Transfuse <2nd u pRBCs 8/13, hgb remains stable at 9.2 today   Prostate cancer status post external beam radiation, completed in June 2025.  Chronic urinary retention managed with a Foley catheter. first noted to be in urinary retention in August 2022. He has his catheter exchanged every 4 weeks in the urology clinic.  Acute urinary retention d/t clot Urology follow up  Continue Proscar  and Flomax     DVT  left leg and pulmonary embolism  Temporarily hold Eliquis  SCDs   CAD  HLD Troponin 23 --> 26, possibly due to demand ischemia. Continue Lipitor    HTN Hold Cozaar  due to worsening renal function. BP has maintained WNL   Type II diabetes mellitus with renal manifestations [facility]): Recent A1c 6.3, well-controlled.   Patient is taking Jardiance and metformin SSI   HIV (human immunodeficiency virus infection) (HCC) Continue Juluca     Tobacco use Nicotine  patch  Body mass index is 15.96 kg/m.  VTE ppx: Place and maintain sequential compression device Start: 09/17/23 1256 SCDs Start: 09/16/23 2126  Diet:     Diet   Diet NPO time specified Except for: Sips with Meds   Consultants: Urology   Subjective 09/19/23    Pt reports feeling fine today. Awaiting his urology procedure, he has no complaints today. Urine is faintly pink   Objective  Blood pressure 136/83, pulse 73, temperature (!) 97.3 F (36.3 C), resp. rate 18, height 5' 8 (1.727 m), weight 47.6 kg, SpO2 100%.  Intake/Output Summary (Last 24 hours) at 09/19/2023 0828 Last data filed at 09/19/2023 0700 Gross per 24 hour  Intake 62924 ml  Output 57550 ml  Net -20475 ml   Filed Weights   09/16/23 1345  Weight: 47.6 kg     Physical Exam:  General: awake, alert, NAD, frail HEENT: atraumatic, clear conjunctiva, anicteric sclera, MMM, hearing grossly normal Respiratory: normal respiratory effort. Cardiovascular: quick capillary refill Gastrointestinal: soft, NT, ND Nervous: A&O x3. no gross focal neurologic deficits, normal speech Extremities: moves all equally, no edema, normal tone Skin: dry, intact, normal temperature, normal color. No rashes, lesions or ulcers on exposed skin Psychiatry: normal mood, congruent affect  Labs   I have personally reviewed the following labs and imaging studies CBC    Component Value Date/Time   WBC 13.8 (H) 09/19/2023 0321   RBC 3.32 (L) 09/19/2023 0321   HGB 9.2 (L) 09/19/2023 0321   HGB 9.3 (L) 05/30/2023 1025   HGB 8.2 (L) 04/25/2023 0709   HCT 28.7 (L) 09/19/2023 0321   HCT 24.7 (L) 04/25/2023 0709   PLT 204  09/19/2023 0321   PLT 174 05/30/2023 1025   PLT 128 (L) 04/25/2023 0709   MCV 86.4 09/19/2023 0321   MCV 96 04/25/2023 0709   MCH 27.7 09/19/2023 0321   MCHC 32.1 09/19/2023 0321   RDW 15.9 (H) 09/19/2023 0321   RDW 13.9 04/25/2023 0709   LYMPHSABS 1.3 08/22/2023 1533   LYMPHSABS 1.7 04/25/2023 0709   MONOABS 0.3 08/22/2023 1533   EOSABS 0.1 08/22/2023 1533   EOSABS 0.1 04/25/2023 0709   BASOSABS 0.0 08/22/2023 1533   BASOSABS 0.0 04/25/2023 0709      Latest Ref Rng & Units 09/18/2023    4:38 AM 09/17/2023    4:33 AM 09/16/2023    2:23 PM  BMP  Glucose 70 - 99 mg/dL 810  876  809   BUN 8 - 23 mg/dL 58  54  52   Creatinine 0.61 - 1.24 mg/dL 4.53  3.37  2.96   Sodium 135 - 145 mmol/L 134  136  130   Potassium 3.5 - 5.1 mmol/L 4.7  5.1  5.1   Chloride 98 - 111 mmol/L 102  104  97   CO2 22 - 32 mmol/L 23  20  20    Calcium  8.9 - 10.3 mg/dL 8.6  9.1  9.5     US  RENAL Result Date: 09/18/2023 CLINICAL DATA:  Gross hematuria. EXAM: RENAL / URINARY TRACT ULTRASOUND COMPLETE COMPARISON:  None Available. FINDINGS: Right Kidney: Renal measurements: 9.1 cm x 5.2 cm x 6.0 cm = volume: 148 mL. Diffusely increased echogenicity of the renal parenchyma is seen. No mass is visualized. There is moderate severity right-sided hydronephrosis. Left Kidney: Renal measurements: 9.5 cm x 5.4 cm x 4.1 cm = volume: 111.6 mL. Diffusely increased echogenicity of the renal parenchyma is seen. 0.9 cm x 0.6 cm x 1.1 cm and 1.8 cm x 1.4 cm x 1.6 cm left renal cysts are noted. There is mild left-sided hydronephrosis. Bladder: A Foley catheter is in place. Large amount of air is seen within the bladder lumen. Other: None. IMPRESSION: 1. Bilateral echogenic kidneys which may represent sequelae associated with medical renal disease. 2. Small left renal cyst. 3. Bilateral hydronephrosis, right greater than left. 4. Large amount of air within the bladder lumen. Electronically Signed   By: Suzen Dials M.D.   On:  09/18/2023 20:55    Disposition Plan & Communication  Patient status: Inpatient  Admitted From: Home Planned disposition location: Home Anticipated discharge date: 8/15 pending hgb stability, urology workup  Family Communication: none at bedside    Author: Marien LITTIE Piety, DO [facility] 09/19/2023, 8:28 AM   Available by Epic secure chat 7AM-7PM. If 7PM-7AM, please contact night-coverage.  [facility] contact information found on [email or web address].

## 2023-09-19 NOTE — Anesthesia Preprocedure Evaluation (Addendum)
 Anesthesia Evaluation  Patient identified by MRN, date of birth, ID band Patient confused  General Assessment Comment:A&Ox2  Reviewed: Allergy & Precautions, H&P , NPO status , Patient's Chart, lab work & pertinent test results  Airway Mallampati: II  TM Distance: >3 FB Neck ROM: full    Dental  (+) Missing, Poor Dentition, Dental Advisory Given   Pulmonary neg shortness of breath, neg sleep apnea, neg recent URI, Current Smoker and Patient abstained from smoking.   Pulmonary exam normal        Cardiovascular Exercise Tolerance: Poor hypertension, Pt. on home beta blockers and Pt. 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  History of HIV dementia.   Hep C - s/p Harvoni for 12 weeks treatment  Patient reports balancing problem.  He uses wheelchair.  He lives in a group home   Past Medical History: No date: Acute renal failure (HCC) 01/20/2012: Altered mental status No date: Diabetes mellitus without complication (HCC) No date: HIV (human immunodeficiency virus infection) (HCC) No date: Hypertension  Past Surgical History: No date: NO PAST SURGERIES     Reproductive/Obstetrics negative OB ROS                              Anesthesia Physical Anesthesia Plan  ASA: 3  Anesthesia Plan: General   Post-op Pain Management: Minimal or no  pain anticipated   Induction: Intravenous  PONV Risk Score and Plan: 1 and Propofol  infusion and TIVA  Airway Management Planned: Natural Airway and Nasal Cannula  Additional Equipment: None  Intra-op Plan:   Post-operative Plan:   Informed Consent: I have reviewed the patients History and Physical, chart, labs and discussed the procedure including the risks, benefits and alternatives for the proposed anesthesia with the patient or authorized representative who has indicated his/her understanding and acceptance.     Dental Advisory Given and Consent reviewed with POA  Plan Discussed with: CRNA and Surgeon  Anesthesia Plan Comments: (Discussed risks of anesthesia with patient, as well as the caregiver at bedside who provides consent (due to patient's dementia), including possibility of difficulty with spontaneous ventilation under anesthesia necessitating airway intervention, PONV, and rare risks such as cardiac or respiratory or neurological events, and allergic reactions. Discussed the role of CRNA in patient's perioperative care. Caregiver understands.)         Anesthesia Quick Evaluation

## 2023-09-19 NOTE — Plan of Care (Signed)
  Problem: Education: Goal: Ability to describe self-care measures that may prevent or decrease complications (Diabetes Survival Skills Education) will improve Outcome: Progressing   Problem: Coping: Goal: Ability to adjust to condition or change in health will improve Outcome: Progressing   Problem: Fluid Volume: Goal: Ability to maintain a balanced intake and output will improve Outcome: Progressing   Problem: Health Behavior/Discharge Planning: Goal: Ability to identify and utilize available resources and services will improve Outcome: Progressing   Problem: Metabolic: Goal: Ability to maintain appropriate glucose levels will improve Outcome: Progressing   Problem: Nutritional: Goal: Maintenance of adequate nutrition will improve Outcome: Progressing   Problem: Skin Integrity: Goal: Risk for impaired skin integrity will decrease Outcome: Progressing   Problem: Tissue Perfusion: Goal: Adequacy of tissue perfusion will improve Outcome: Progressing   Problem: Education: Goal: Knowledge of General Education information will improve Description: Including pain rating scale, medication(s)/side effects and non-pharmacologic comfort measures Outcome: Progressing   Problem: Clinical Measurements: Goal: Ability to maintain clinical measurements within normal limits will improve Outcome: Progressing   Problem: Activity: Goal: Risk for activity intolerance will decrease Outcome: Progressing   Problem: Nutrition: Goal: Adequate nutrition will be maintained Outcome: Progressing   Problem: Coping: Goal: Level of anxiety will decrease Outcome: Progressing   Problem: Elimination: Goal: Will not experience complications related to bowel motility Outcome: Progressing   Problem: Pain Managment: Goal: General experience of comfort will improve and/or be controlled Outcome: Progressing   Problem: Safety: Goal: Ability to remain free from injury will improve Outcome:  Progressing   Problem: Skin Integrity: Goal: Risk for impaired skin integrity will decrease Outcome: Progressing

## 2023-09-19 NOTE — Progress Notes (Signed)
 Urology Inpatient Progress Note  Subjective: No acute events overnight. He is afebrile, VSS. Renal ultrasound yesterday redemonstrates bilateral hydronephrosis, R>L, and shows air within the bladder lumen. Hemoglobin stable overnight, 9.2, s/p 1 unit PRBCs.  Total 2 units infused since admission.  A.m. BMP is pending. Foley catheter in place draining pink-tinged urine on slow drip CBI. He reports fatigue and persistent left-sided abdominal pain.  Anti-infectives: Anti-infectives (From admission, onward)    Start     Dose/Rate Route Frequency Ordered Stop   09/17/23 1000  dolutegravir -rilpivirine  (JULUCA ) 50-25 MG per tablet 1 tablet        1 tablet Oral Daily 09/17/23 0147     09/17/23 0600  cefTRIAXone  (ROCEPHIN ) 2 g in sodium chloride  0.9 % 100 mL IVPB        2 g 200 mL/hr over 30 Minutes Intravenous Every 24 hours 09/16/23 2207     09/16/23 1645  ceFEPIme  (MAXIPIME ) 2 g in sodium chloride  0.9 % 100 mL IVPB        2 g 200 mL/hr over 30 Minutes Intravenous  Once 09/16/23 1639 09/16/23 1840   09/16/23 1645  metroNIDAZOLE  (FLAGYL ) IVPB 500 mg        500 mg 100 mL/hr over 60 Minutes Intravenous  Once 09/16/23 1639 09/16/23 1840       Current Facility-Administered Medications  Medication Dose Route Frequency Provider Last Rate Last Admin   acetaminophen  (TYLENOL ) tablet 650 mg  650 mg Oral Q6H PRN Niu, Xilin, MD       albuterol  (PROVENTIL ) (2.5 MG/3ML) 0.083% nebulizer solution 2.5 mg  2.5 mg Inhalation Q4H PRN Niu, Xilin, MD       atorvastatin  (LIPITOR) tablet 20 mg  20 mg Oral QHS Niu, Xilin, MD   20 mg at 09/18/23 2156   cefTRIAXone  (ROCEPHIN ) 2 g in sodium chloride  0.9 % 100 mL IVPB  2 g Intravenous Q24H Niu, Xilin, MD 200 mL/hr at 09/19/23 0519 2 g at 09/19/23 0519   Chlorhexidine  Gluconate Cloth 2 % PADS 6 each  6 each Topical Daily Alexander, Natalie, DO   6 each at 09/19/23 9157   cholecalciferol  (VITAMIN D3) 25 MCG (1000 UNIT) tablet 1,000 Units  1,000 Units Oral QHS Niu,  Xilin, MD   1,000 Units at 09/18/23 2155   dextromethorphan-guaiFENesin  (MUCINEX  DM) 30-600 MG per 12 hr tablet 1 tablet  1 tablet Oral BID PRN Niu, Xilin, MD       dolutegravir -rilpivirine  (JULUCA ) 50-25 MG per tablet 1 tablet  1 tablet Oral Daily Niu, Xilin, MD   1 tablet at 09/19/23 0840   feeding supplement (ENSURE PLUS HIGH PROTEIN) liquid 237 mL  237 mL Oral BID BM Alexander, Natalie, DO   237 mL at 09/18/23 9071   finasteride  (PROSCAR ) tablet 5 mg  5 mg Oral Daily Niu, Xilin, MD   5 mg at 09/19/23 0840   folic acid  (FOLVITE ) tablet 1 mg  1 mg Oral Daily Niu, Xilin, MD   1 mg at 09/19/23 0840   insulin  aspart (novoLOG ) injection 0-9 Units  0-9 Units Subcutaneous TID WC Niu, Xilin, MD   2 Units at 09/18/23 1725   loperamide  (IMODIUM ) capsule 2 mg  2 mg Oral QID PRN Niu, Xilin, MD       metoprolol  succinate (TOPROL -XL) 24 hr tablet 100 mg  100 mg Oral Daily Niu, Xilin, MD   100 mg at 09/19/23 0840   multivitamin with minerals tablet 1 tablet  1 tablet Oral Daily Marsa Edelman, DO  1 tablet at 09/18/23 1725   nicotine  (NICODERM CQ  - dosed in mg/24 hours) patch 21 mg  21 mg Transdermal Daily Niu, Xilin, MD   21 mg at 09/19/23 0841   ondansetron  (ZOFRAN ) injection 4 mg  4 mg Intravenous Q8H PRN Niu, Xilin, MD       oxyCODONE  (Oxy IR/ROXICODONE ) immediate release tablet 5 mg  5 mg Oral Q6H PRN Niu, Xilin, MD   5 mg at 09/19/23 0046   sodium bicarbonate  tablet 650 mg  650 mg Oral BID Niu, Xilin, MD   650 mg at 09/19/23 0840   sodium chloride  irrigation 0.9 % 3,000 mL  3,000 mL Irrigation Continuous Funke, Mary E, MD   3,000 mL at 09/19/23 9359   tamsulosin  (FLOMAX ) capsule 0.4 mg  0.4 mg Oral QPC supper Niu, Xilin, MD   0.4 mg at 09/18/23 1725   Vilazodone  HCl TABS 20 mg  20 mg Oral Daily Niu, Xilin, MD   20 mg at 09/19/23 0840     Objective: Vital signs in last 24 hours: Temp:  [97.6 F (36.4 C)-98.7 F (37.1 C)] 98.6 F (37 C) (08/14 0733) Pulse Rate:  [77-96] 87 (08/14 0733) Resp:   [15-18] 17 (08/14 0447) BP: (111-150)/(66-87) 150/87 (08/14 0733) SpO2:  [100 %] 100 % (08/14 0733)  Intake/Output from previous day: 08/13 0701 - 08/14 0700 In: 62924 [P.O.:660; Blood:315; IV Piggyback:100] Out: 58950 [Urine:58950] Intake/Output this shift: Total I/O In: 3000 [Other:3000] Out: 3000 [Urine:3000]  Physical Exam Vitals and nursing note reviewed.  Constitutional:      General: He is not in acute distress.    Appearance: He is not ill-appearing, toxic-appearing or diaphoretic.  Pulmonary:     Effort: Pulmonary effort is normal. No respiratory distress.  Skin:    General: Skin is warm and dry.  Neurological:     Mental Status: He is alert and oriented to person, place, and time.  Psychiatric:        Mood and Affect: Mood normal.        Behavior: Behavior normal.    Lab Results:  Recent Labs    09/18/23 1611 09/19/23 0321  WBC 15.7* 13.8*  HGB 9.2* 9.2*  HCT 29.8* 28.7*  PLT 220 204   BMET Recent Labs    09/17/23 0433 09/18/23 0438  NA 136 134*  K 5.1 4.7  CL 104 102  CO2 20* 23  GLUCOSE 123* 189*  BUN 54* 58*  CREATININE 6.62* 5.46*  CALCIUM  9.1 8.6*   PT/INR Recent Labs    09/16/23 2308  LABPROT 27.8*  INR 2.5*   Studies/Results: US  RENAL Result Date: 09/18/2023 CLINICAL DATA:  Gross hematuria. EXAM: RENAL / URINARY TRACT ULTRASOUND COMPLETE COMPARISON:  None Available. FINDINGS: Right Kidney: Renal measurements: 9.1 cm x 5.2 cm x 6.0 cm = volume: 148 mL. Diffusely increased echogenicity of the renal parenchyma is seen. No mass is visualized. There is moderate severity right-sided hydronephrosis. Left Kidney: Renal measurements: 9.5 cm x 5.4 cm x 4.1 cm = volume: 111.6 mL. Diffusely increased echogenicity of the renal parenchyma is seen. 0.9 cm x 0.6 cm x 1.1 cm and 1.8 cm x 1.4 cm x 1.6 cm left renal cysts are noted. There is mild left-sided hydronephrosis. Bladder: A Foley catheter is in place. Large amount of air is seen within the bladder  lumen. Other: None. IMPRESSION: 1. Bilateral echogenic kidneys which may represent sequelae associated with medical renal disease. 2. Small left renal cyst. 3. Bilateral hydronephrosis, right  greater than left. 4. Large amount of air within the bladder lumen. Electronically Signed   By: Suzen Dials M.D.   On: 09/18/2023 20:55   Assessment & Plan: 63 y.o. male with PMH prostate cancer s/p IMRT on 6 months of ADT, BPH with urinary retention on finasteride  managed with chronic Foley catheter, and recent history of intermittent gross hematuria now admitted with gross hematuria with clot retention and AKI with bilateral hydronephrosis.  His urine has cleared considerably on CBI, however he has persistent bilateral hydronephrosis, left-sided abdominal pain, AKI, and I suspect the air within the bladder lumen seen on ultrasound is actually air pockets within a well-organized clot.  At this point, I recommended proceeding to the OR with Dr. Twylla today for cystoscopy with clot evacuation, possible biopsy, possible fulguration, and possible unilateral versus bilateral ureteral stent placement.  Patient is in agreement with this plan.  Recommendations: -Keep n.p.o. in advance of procedure - Continue antibiotics per primary team - Procedure with Dr. Twylla later today; informed consent order in  Lucie Hones, PA-C 09/19/2023

## 2023-09-19 NOTE — Progress Notes (Signed)
 Patient going off floor on bed by personnel from OR. Informed consent signed and in chart.

## 2023-09-19 NOTE — Transfer of Care (Signed)
 Immediate Anesthesia Transfer of Care Note  Patient: Jay Flores  Procedure(s) Performed: Procedure(s): TURBT (TRANSURETHRAL RESECTION OF BLADDER TUMOR) CYSTOSCOPY  Patient Location: PACU  Anesthesia Type:General  Level of Consciousness: sedated  Airway & Oxygen Therapy: Patient Spontanous Breathing and Patient connected to face mask oxygen  Post-op Assessment: Report given to RN and Post -op Vital signs reviewed and stable  Post vital signs: Reviewed and stable  Last Vitals:  Vitals:   09/19/23 1427 09/19/23 1428  BP: 131/73 131/73  Pulse: 68 68  Resp: 15 15  Temp: 36.7 C 36.7 C  SpO2: 100% 100%    Complications: No apparent anesthesia complications

## 2023-09-19 NOTE — H&P (View-Only) (Signed)
 Urology Inpatient Progress Note  Subjective: No acute events overnight. He is afebrile, VSS. Renal ultrasound yesterday redemonstrates bilateral hydronephrosis, R>L, and shows air within the bladder lumen. Hemoglobin stable overnight, 9.2, s/p 1 unit PRBCs.  Total 2 units infused since admission.  A.m. BMP is pending. Foley catheter in place draining pink-tinged urine on slow drip CBI. He reports fatigue and persistent left-sided abdominal pain.  Anti-infectives: Anti-infectives (From admission, onward)    Start     Dose/Rate Route Frequency Ordered Stop   09/17/23 1000  dolutegravir -rilpivirine  (JULUCA ) 50-25 MG per tablet 1 tablet        1 tablet Oral Daily 09/17/23 0147     09/17/23 0600  cefTRIAXone  (ROCEPHIN ) 2 g in sodium chloride  0.9 % 100 mL IVPB        2 g 200 mL/hr over 30 Minutes Intravenous Every 24 hours 09/16/23 2207     09/16/23 1645  ceFEPIme  (MAXIPIME ) 2 g in sodium chloride  0.9 % 100 mL IVPB        2 g 200 mL/hr over 30 Minutes Intravenous  Once 09/16/23 1639 09/16/23 1840   09/16/23 1645  metroNIDAZOLE  (FLAGYL ) IVPB 500 mg        500 mg 100 mL/hr over 60 Minutes Intravenous  Once 09/16/23 1639 09/16/23 1840       Current Facility-Administered Medications  Medication Dose Route Frequency Provider Last Rate Last Admin   acetaminophen  (TYLENOL ) tablet 650 mg  650 mg Oral Q6H PRN Niu, Xilin, MD       albuterol  (PROVENTIL ) (2.5 MG/3ML) 0.083% nebulizer solution 2.5 mg  2.5 mg Inhalation Q4H PRN Niu, Xilin, MD       atorvastatin  (LIPITOR) tablet 20 mg  20 mg Oral QHS Niu, Xilin, MD   20 mg at 09/18/23 2156   cefTRIAXone  (ROCEPHIN ) 2 g in sodium chloride  0.9 % 100 mL IVPB  2 g Intravenous Q24H Niu, Xilin, MD 200 mL/hr at 09/19/23 0519 2 g at 09/19/23 0519   Chlorhexidine  Gluconate Cloth 2 % PADS 6 each  6 each Topical Daily Alexander, Natalie, DO   6 each at 09/19/23 9157   cholecalciferol  (VITAMIN D3) 25 MCG (1000 UNIT) tablet 1,000 Units  1,000 Units Oral QHS Niu,  Xilin, MD   1,000 Units at 09/18/23 2155   dextromethorphan-guaiFENesin  (MUCINEX  DM) 30-600 MG per 12 hr tablet 1 tablet  1 tablet Oral BID PRN Niu, Xilin, MD       dolutegravir -rilpivirine  (JULUCA ) 50-25 MG per tablet 1 tablet  1 tablet Oral Daily Niu, Xilin, MD   1 tablet at 09/19/23 0840   feeding supplement (ENSURE PLUS HIGH PROTEIN) liquid 237 mL  237 mL Oral BID BM Alexander, Natalie, DO   237 mL at 09/18/23 9071   finasteride  (PROSCAR ) tablet 5 mg  5 mg Oral Daily Niu, Xilin, MD   5 mg at 09/19/23 0840   folic acid  (FOLVITE ) tablet 1 mg  1 mg Oral Daily Niu, Xilin, MD   1 mg at 09/19/23 0840   insulin  aspart (novoLOG ) injection 0-9 Units  0-9 Units Subcutaneous TID WC Niu, Xilin, MD   2 Units at 09/18/23 1725   loperamide  (IMODIUM ) capsule 2 mg  2 mg Oral QID PRN Niu, Xilin, MD       metoprolol  succinate (TOPROL -XL) 24 hr tablet 100 mg  100 mg Oral Daily Niu, Xilin, MD   100 mg at 09/19/23 0840   multivitamin with minerals tablet 1 tablet  1 tablet Oral Daily Marsa Edelman, DO  1 tablet at 09/18/23 1725   nicotine  (NICODERM CQ  - dosed in mg/24 hours) patch 21 mg  21 mg Transdermal Daily Niu, Xilin, MD   21 mg at 09/19/23 0841   ondansetron  (ZOFRAN ) injection 4 mg  4 mg Intravenous Q8H PRN Niu, Xilin, MD       oxyCODONE  (Oxy IR/ROXICODONE ) immediate release tablet 5 mg  5 mg Oral Q6H PRN Niu, Xilin, MD   5 mg at 09/19/23 0046   sodium bicarbonate  tablet 650 mg  650 mg Oral BID Niu, Xilin, MD   650 mg at 09/19/23 0840   sodium chloride  irrigation 0.9 % 3,000 mL  3,000 mL Irrigation Continuous Funke, Mary E, MD   3,000 mL at 09/19/23 9359   tamsulosin  (FLOMAX ) capsule 0.4 mg  0.4 mg Oral QPC supper Niu, Xilin, MD   0.4 mg at 09/18/23 1725   Vilazodone  HCl TABS 20 mg  20 mg Oral Daily Niu, Xilin, MD   20 mg at 09/19/23 0840     Objective: Vital signs in last 24 hours: Temp:  [97.6 F (36.4 C)-98.7 F (37.1 C)] 98.6 F (37 C) (08/14 0733) Pulse Rate:  [77-96] 87 (08/14 0733) Resp:   [15-18] 17 (08/14 0447) BP: (111-150)/(66-87) 150/87 (08/14 0733) SpO2:  [100 %] 100 % (08/14 0733)  Intake/Output from previous day: 08/13 0701 - 08/14 0700 In: 62924 [P.O.:660; Blood:315; IV Piggyback:100] Out: 58950 [Urine:58950] Intake/Output this shift: Total I/O In: 3000 [Other:3000] Out: 3000 [Urine:3000]  Physical Exam Vitals and nursing note reviewed.  Constitutional:      General: He is not in acute distress.    Appearance: He is not ill-appearing, toxic-appearing or diaphoretic.  Pulmonary:     Effort: Pulmonary effort is normal. No respiratory distress.  Skin:    General: Skin is warm and dry.  Neurological:     Mental Status: He is alert and oriented to person, place, and time.  Psychiatric:        Mood and Affect: Mood normal.        Behavior: Behavior normal.    Lab Results:  Recent Labs    09/18/23 1611 09/19/23 0321  WBC 15.7* 13.8*  HGB 9.2* 9.2*  HCT 29.8* 28.7*  PLT 220 204   BMET Recent Labs    09/17/23 0433 09/18/23 0438  NA 136 134*  K 5.1 4.7  CL 104 102  CO2 20* 23  GLUCOSE 123* 189*  BUN 54* 58*  CREATININE 6.62* 5.46*  CALCIUM  9.1 8.6*   PT/INR Recent Labs    09/16/23 2308  LABPROT 27.8*  INR 2.5*   Studies/Results: US  RENAL Result Date: 09/18/2023 CLINICAL DATA:  Gross hematuria. EXAM: RENAL / URINARY TRACT ULTRASOUND COMPLETE COMPARISON:  None Available. FINDINGS: Right Kidney: Renal measurements: 9.1 cm x 5.2 cm x 6.0 cm = volume: 148 mL. Diffusely increased echogenicity of the renal parenchyma is seen. No mass is visualized. There is moderate severity right-sided hydronephrosis. Left Kidney: Renal measurements: 9.5 cm x 5.4 cm x 4.1 cm = volume: 111.6 mL. Diffusely increased echogenicity of the renal parenchyma is seen. 0.9 cm x 0.6 cm x 1.1 cm and 1.8 cm x 1.4 cm x 1.6 cm left renal cysts are noted. There is mild left-sided hydronephrosis. Bladder: A Foley catheter is in place. Large amount of air is seen within the bladder  lumen. Other: None. IMPRESSION: 1. Bilateral echogenic kidneys which may represent sequelae associated with medical renal disease. 2. Small left renal cyst. 3. Bilateral hydronephrosis, right  greater than left. 4. Large amount of air within the bladder lumen. Electronically Signed   By: Suzen Dials M.D.   On: 09/18/2023 20:55   Assessment & Plan: 63 y.o. male with PMH prostate cancer s/p IMRT on 6 months of ADT, BPH with urinary retention on finasteride  managed with chronic Foley catheter, and recent history of intermittent gross hematuria now admitted with gross hematuria with clot retention and AKI with bilateral hydronephrosis.  His urine has cleared considerably on CBI, however he has persistent bilateral hydronephrosis, left-sided abdominal pain, AKI, and I suspect the air within the bladder lumen seen on ultrasound is actually air pockets within a well-organized clot.  At this point, I recommended proceeding to the OR with Dr. Twylla today for cystoscopy with clot evacuation, possible biopsy, possible fulguration, and possible unilateral versus bilateral ureteral stent placement.  Patient is in agreement with this plan.  Recommendations: -Keep n.p.o. in advance of procedure - Continue antibiotics per primary team - Procedure with Dr. Twylla later today; informed consent order in  Lucie Hones, PA-C 09/19/2023

## 2023-09-19 NOTE — Progress Notes (Signed)
 PT Cancellation Note  Patient Details Name: RICCI DIROCCO MRN: [id number] DOB: 08-12-1960   Cancelled Treatment:    Reason Eval/Treat Not Completed: Other (comment). Per chart/RN pt still on CBI this morning, pending further medical workup/intervention with urology. PT to sign off, please re-consult when pt is medically appropriate.    Doyal Shams PT, DPT 11:08 AM,09/19/23

## 2023-09-19 NOTE — Progress Notes (Signed)
 Offered to provide hygenic care, pt refuse. States he want to sleep

## 2023-09-19 NOTE — Anesthesia Procedure Notes (Signed)
 Procedure Name: LMA Insertion Date/Time: 09/19/2023 1:30 PM  Performed by: Tod Handing, CRNAPre-anesthesia Checklist: Patient identified, Patient being monitored, Timeout performed, Emergency Drugs available and Suction available Patient Re-evaluated:Patient Re-evaluated prior to induction Oxygen Delivery Method: Circle system utilized Preoxygenation: Pre-oxygenation with 100% oxygen Induction Type: IV induction Ventilation: Mask ventilation without difficulty LMA: LMA inserted LMA Size: 4.0 Tube type: Oral Number of attempts: 1 Placement Confirmation: positive ETCO2 and breath sounds checked- equal and bilateral Tube secured with: Tape Dental Injury: Teeth and Oropharynx as per pre-operative assessment

## 2023-09-19 NOTE — Op Note (Signed)
    Preoperative diagnosis: Gross hematuria with clot retention Bilateral hydronephrosis (R >L) with AKI  Postoperative diagnosis:  Multifocal bladder tumors  Procedure:  Cystoscopy Transurethral resection of bladder tumor (2-5 cm)   Surgeon: Glendia C. Cavan Bearden, M.D.  Anesthesia: General  Complications: None  Intraoperative findings:  Multiple nodular, whitish bladder tumors located lateral walls, upper posterior wall and obscuring the trigone.  Largest tumor ~3 cm.  UOs were not identified.  Transurethral resection performed for tissue diagnosis and tumors were not completely resected Fixed prostatic urethra with prostatic fossa with same appearance as bladder findings No significant bleeding noted          EBL: Minimal  Specimens: Bladder mass Base of bladder mass      Indication: Jay Flores is a patient who was admitted for gross hematuria with clot retention and CT showing bilateral hydronephrosis.  After catheter placement a follow-up renal ultrasound with persistent moderate-severe right hydronephrosis and mild left hydronephrosis.  Refer to progress notes for details.  After reviewing the management options for treatment, he elected to proceed with the above surgical procedure(s). We have discussed the potential benefits and risks of the procedure, side effects of the proposed treatment, the likelihood of the patient achieving the goals of the procedure, and any potential problems that might occur during the procedure or recuperation. Informed consent has been obtained.  Description of procedure:  The patient was taken to the operating room and general anesthesia was induced.  The patient was placed in the dorsal lithotomy position, prepped and draped in the usual sterile fashion, and preoperative antibiotics were administered. A preoperative time-out was performed.   A 26 continuous-flow resectoscope sheath with visual obturator was lubricated, placed per  urethra and advanced proximally into the bladder under direct vision.  Only a small amount of clot was irrigated from the bladder.  A 30 degree lens with laser bridge was then placed and cystoscopy was performed with findings as described above.  The laser bridge was replaced with an Faustine resectoscope with a loop.  Several of these bladder tumors were resected and sent for pathologic analysis.  No significant bleeding was noted.  The base of one of the tumors was biopsied with rigid forceps and sent separately.  Mild oozing was controlled with cautery.  Thorough search of the area of the trigone failed to visualize UOs on either side   A 22 three-way Foley catheter with catheter guide was lubricated and advanced into the bladder.  The catheter was irrigated with return of clear effluent.  The catheter was placed to low flow CBI.  After anesthetic reversal he was transported to the PACU in stable condition  Plan: Low flow CBI overnight and discontinue in a.m. if effluent clear Will need to discuss nephrostomy tube placement with patient and HCPOA in a.m.   Dawnetta Copenhaver C. Twylla,  MD

## 2023-09-19 NOTE — Interval H&P Note (Signed)
 History and Physical Interval Note:  09/19/2023 1:05 PM  Jay Flores  has presented today for surgery, with the diagnosis of Gross hematuria, AKI bilat hydronephrosis.  The various methods of treatment have been discussed with the patient and family. After consideration of risks, benefits and other options for treatment, the patient has consented to  Procedure(s) with comments: CYSTOSCOPY, WITH BLADDER FULGURATION (N/A) - psb biopsy CYSTOSCOPY, WITH STENT INSERTION (Bilateral) as a surgical intervention.  The patient's history has been reviewed, patient examined, no change in status, stable for surgery.  I have reviewed the patient's chart and labs.  Questions were answered to the patient's satisfaction.    CV: RRR Lungs: Clear  Wyley Hack C Kerrigan Glendening

## 2023-09-20 ENCOUNTER — Other Ambulatory Visit: Admitting: Urology

## 2023-09-20 ENCOUNTER — Encounter: Payer: Self-pay | Admitting: Urology

## 2023-09-20 DIAGNOSIS — R339 Retention of urine, unspecified: Secondary | ICD-10-CM | POA: Diagnosis not present

## 2023-09-20 DIAGNOSIS — N139 Obstructive and reflux uropathy, unspecified: Secondary | ICD-10-CM | POA: Diagnosis not present

## 2023-09-20 DIAGNOSIS — Z515 Encounter for palliative care: Secondary | ICD-10-CM | POA: Diagnosis not present

## 2023-09-20 DIAGNOSIS — N179 Acute kidney failure, unspecified: Secondary | ICD-10-CM | POA: Diagnosis not present

## 2023-09-20 DIAGNOSIS — N184 Chronic kidney disease, stage 4 (severe): Secondary | ICD-10-CM

## 2023-09-20 DIAGNOSIS — D631 Anemia in chronic kidney disease: Secondary | ICD-10-CM

## 2023-09-20 DIAGNOSIS — R319 Hematuria, unspecified: Secondary | ICD-10-CM

## 2023-09-20 DIAGNOSIS — N1832 Chronic kidney disease, stage 3b: Secondary | ICD-10-CM | POA: Diagnosis not present

## 2023-09-20 DIAGNOSIS — N3289 Other specified disorders of bladder: Secondary | ICD-10-CM | POA: Diagnosis not present

## 2023-09-20 LAB — CBC
HCT: 28.2 % — ABNORMAL LOW (ref 39.0–52.0)
Hemoglobin: 8.8 g/dL — ABNORMAL LOW (ref 13.0–17.0)
MCH: 27.3 pg (ref 26.0–34.0)
MCHC: 31.2 g/dL (ref 30.0–36.0)
MCV: 87.6 fL (ref 80.0–100.0)
Platelets: 200 K/uL (ref 150–400)
RBC: 3.22 MIL/uL — ABNORMAL LOW (ref 4.22–5.81)
RDW: 15.8 % — ABNORMAL HIGH (ref 11.5–15.5)
WBC: 13.8 K/uL — ABNORMAL HIGH (ref 4.0–10.5)
nRBC: 0 % (ref 0.0–0.2)

## 2023-09-20 LAB — URINE CULTURE: Culture: >=100000 — AB

## 2023-09-20 LAB — BASIC METABOLIC PANEL WITH GFR
Anion gap: 10 (ref 5–15)
BUN: 53 mg/dL — ABNORMAL HIGH (ref 8–23)
CO2: 23 mmol/L (ref 22–32)
Calcium: 8.9 mg/dL (ref 8.9–10.3)
Chloride: 107 mmol/L (ref 98–111)
Creatinine, Ser: 3.89 mg/dL — ABNORMAL HIGH (ref 0.61–1.24)
GFR, Estimated: 17 mL/min — ABNORMAL LOW (ref >60)
Glucose, Bld: 179 mg/dL — ABNORMAL HIGH (ref 70–99)
Potassium: 4.8 mmol/L (ref 3.5–5.1)
Sodium: 140 mmol/L (ref 135–145)

## 2023-09-20 LAB — GLUCOSE, CAPILLARY
Glucose-Capillary: 187 mg/dL — ABNORMAL HIGH (ref 70–99)
Glucose-Capillary: 180 mg/dL — ABNORMAL HIGH (ref 70–99)
Glucose-Capillary: 205 mg/dL — ABNORMAL HIGH (ref 70–99)
Glucose-Capillary: 205 mg/dL — ABNORMAL HIGH (ref 70–99)

## 2023-09-20 MED ORDER — SODIUM CHLORIDE 0.9 % IV SOLN
3.0000 g | Freq: Two times a day (BID) | INTRAVENOUS | Status: DC
  Administered 2023-09-20 – 2023-09-27 (×14): 3 g via INTRAVENOUS
  Filled 2023-09-20 (×16): qty 8

## 2023-09-20 MED ORDER — GABAPENTIN 100 MG PO CAPS
200.0000 mg | ORAL_CAPSULE | Freq: Once | ORAL | Status: AC
  Administered 2023-09-20: 200 mg via ORAL
  Filled 2023-09-20: qty 2

## 2023-09-20 NOTE — Anesthesia Postprocedure Evaluation (Signed)
 Anesthesia Post Note  Patient: Jay Flores  Procedure(s) Performed: TURBT (TRANSURETHRAL RESECTION OF BLADDER TUMOR) CYSTOSCOPY  Patient location during evaluation: PACU Anesthesia Type: General Level of consciousness: awake and alert Pain management: pain level controlled Vital Signs Assessment: post-procedure vital signs reviewed and stable Respiratory status: spontaneous breathing, nonlabored ventilation and respiratory function stable Cardiovascular status: blood pressure returned to baseline and stable Postop Assessment: no apparent nausea or vomiting Anesthetic complications: no   No notable events documented.   Last Vitals:  Vitals:   09/20/23 0426 09/20/23 0823  BP: (!) 142/83 (!) 150/78  Pulse: 75 77  Resp: 18 18  Temp: 36.6 C 36.8 C  SpO2: 100% 100%    Last Pain:  Vitals:   09/19/23 2044  TempSrc:   PainSc: 0-No pain                 Camellia Merilee Louder

## 2023-09-20 NOTE — Consult Note (Signed)
 Hematology/Oncology Consult note [phone] [phone]      Patient Care Team: [name], MD as PCP - General (Infectious Diseases) Quintella Fillers, NP (Inactive) as Nurse Practitioner (Family Medicine) Babara Call, MD as Consulting Physician (Oncology)   Name of the patient: Jay Flores  [id number]  Jun 03, 1960   REASON FOR COSULTATION:  Bladder mass, anemia History of presenting illness-  63 y.o. male with PMH listed at below who is currently admitted due to  hematuria, lower abdominal pain.   Patient has a history of prostate cancer status post external beam radiation.  June 2025.  Chronic urinary retention managed with Foley catheter. Patient has bladder tumor found on CT and there was plan of cystoscopy outpatient. Patient had outpatient IV Venofer  treatments for anemia due to CKD. After 3rd Venofer  treatments, he has felt worse abdominal pain also chest pain. Patient will suggest to go to emergency room for further evaluation.  It was felt less likely that the symptoms are secondary to IV Venofer  treatments as he tolerated well with his first 2 doses.  09/16/2023, CT pelvis without contrast showed Mild distended bladder with cystoscopy.  Bladder wall diverticulum.  Mixed echogenicity material is noted within the bladder representing air, urine and a likely hyperdense thrombus.  Diffuse bladder wall thickening.  Patient was also found to be in acute on chronic renal failure with creatinine increased to 7.03, from his baseline of 2.   Patient was admitted.  He received supportive care including IV fluid, empiric treatments for possible UTI.  Urology was consulted.  09/19/2023 status post cystoscopy, transurethral resection of bladder tumor.  Pathology is pending. Cystoscopy intraoperative findings showed multiple nodular whitish bladder tumors located lateral walls, upper posterior wall and obscuring the trigone.  Fixed prostatic urethra with  prostatic fossa with same appearance as bladder findings. TURB pathology is pending.  Patient was seen in the bedside.  He is a poor historian.  He denies any pain today.  No appetite.  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    Priority: Medium    Tobacco use 04/17/2018    Priority: Medium    Acute kidney injury superimposed on stage 3b chronic kidney disease (HCC) 09/16/2023   Bilateral hydronephrosis 09/16/2023   Acute urinary retention 09/16/2023   CAD (coronary artery disease) 09/16/2023   DVT (deep venous thrombosis)_left leg 09/16/2023   Pulmonary embolism (HCC) 09/16/2023   Iron  deficiency anemia 09/16/2023   Bladder wall thickening 09/16/2023   Protein-calorie malnutrition, severe 08/01/2023   NSTEMI (non-ST elevated myocardial infarction) (HCC) 07/29/2023   Diabetes mellitus type 2, noninsulin dependent (HCC) 07/29/2023   CKD (chronic kidney disease) stage 4, GFR 15-29 ml/min (HCC) 07/29/2023   Leukocytosis 07/29/2023   Right pulmonary infiltrate on CXR 07/29/2023   Community acquired pneumonia 07/06/2023   Chest discomfort 07/06/2023   Depression 07/06/2023   HLD (hyperlipidemia) 04/24/2023   Syncope 04/24/2023   Type II diabetes mellitus with renal manifestations (HCC) 04/24/2023   Elevated lactic acid level 04/24/2023   Callus 11/23/2021   Pain due to onychomycosis of toenails of both feet 06/15/2021   Diabetic neuropathy (HCC) 06/15/2021   Bladder dysfunction 05/07/2021   H/O urinary retention 05/07/2021   DKA (  diabetic ketoacidosis) (HCC) 03/29/2017   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   Leg weakness, bilateral 01/18/2016   Difficulty walking 12/19/2015   Hyperreflexia of lower extremity 12/19/2015   Muscle spasm of both lower  legs 12/19/2015   HTN (hypertension) 02/19/2012   Weakness 02/19/2012   Metabolic acidosis 01/27/2012   Hypokalemia 01/23/2012   PNA (pneumonia) 01/22/2012   HIV (human immunodeficiency virus infection) (HCC) 01/22/2012   Brain lesion 01/22/2012   Acute renal failure (HCC) 01/22/2012   Anemia 01/22/2012   Altered mental status 01/22/2012   Hepatitis C infection 10/05/2011     Past Medical History:  Diagnosis Date   Acute renal failure (HCC)    Altered mental status 01/20/2012   Anemia    Brain lesion    Cholelithiasis    Chronic indwelling Foley catheter    CKD (chronic kidney disease) stage 3, GFR 30-59 ml/min (HCC)    COPD (chronic obstructive pulmonary disease) (HCC)    Diabetic neuropathy (HCC)    DKA (diabetic ketoacidosis) (HCC)    DM (diabetes mellitus), type 2 (HCC)    Elevated PSA    Gait instability    uses walker   H/O urinary retention    Hepatitis C infection    s/p Harvoni   History of cocaine use    last used in 2012   HIV (human immunodeficiency virus infection) (HCC)    HIV dementia (HCC)    Hypertension    IPMN (intraductal papillary mucinous neoplasm)    Lives in group home    [facility]   Metabolic acidosis    Pancreatic cyst    Pneumonia    Thrombocytopenia (HCC)    Tobacco use      Past Surgical History:  Procedure Laterality Date   COLONOSCOPY WITH PROPOFOL  N/A 09/05/2022   Procedure: COLONOSCOPY WITH PROPOFOL ;  Surgeon: Toledo, Ladell POUR, MD;  Location: [facility] ENDOSCOPY;  Service: Gastroenterology;  Laterality: N/A;   CYSTOSCOPY  09/19/2023   Procedure: CYSTOSCOPY;  Surgeon: Twylla Glendia BROCKS, MD;  Location: [facility] ORS;  Service: Urology;;   EUS N/A 04/19/2022   Procedure: UPPER ENDOSCOPIC ULTRASOUND (EUS) LINEAR;  Surgeon: Elta Fonda SQUIBB, MD;  Location: [facility] ENDOSCOPY;  Service: Gastroenterology;  Laterality: N/A;  Requesting 1p slot   HEMOSTASIS CLIP PLACEMENT  09/05/2022   Procedure: HEMOSTASIS CLIP  PLACEMENT;  Surgeon: Aundria, Ladell POUR, MD;  Location: [facility] ENDOSCOPY;  Service: Gastroenterology;;   POLYPECTOMY  09/05/2022   Procedure: POLYPECTOMY;  Surgeon: Aundria, Ladell POUR, MD;  Location: [facility] ENDOSCOPY;  Service: Gastroenterology;;   PROSTATE BIOPSY N/A 02/26/2023   Procedure: PROSTATE BIOPSY;  Surgeon: Twylla Glendia BROCKS, MD;  Location: [facility] ORS;  Service: Urology;  Laterality: N/A;   TRANSRECTAL ULTRASOUND N/A 02/26/2023   Procedure: TRANSRECTAL ULTRASOUND;  Surgeon: Twylla Glendia BROCKS, MD;  Location: [facility] ORS;  Service: Urology;  Laterality: N/A;   TRANSURETHRAL RESECTION OF BLADDER TUMOR  09/19/2023   Procedure: TURBT (TRANSURETHRAL RESECTION OF BLADDER TUMOR);  Surgeon: Twylla Glendia BROCKS, MD;  Location: [facility] ORS;  Service: Urology;;    Social History   Socioeconomic History   Marital status: Single    Spouse name: Not on file   Number of children: Not on file   Years of education: Not on file   Highest education level: Not on file  Occupational History   Occupation: disabled  Tobacco Use   Smoking status: Every Day    Current packs/day: 1.00    Average packs/day: 1 pack/day for 34.0 years (  34.0 ttl pk-yrs)    Types: Cigarettes    Passive exposure: Current   Smokeless tobacco: Never  Vaping Use   Vaping status: Never Used  Substance and Sexual Activity   Alcohol use: No   Drug use: Not Currently    Types: Cocaine    Comment: 01/22/2012 last used cocaine ~ 2012   Sexual activity: Not Currently  Other Topics Concern   Not on file  Social History Narrative   Not on file   Social Drivers of Health   Financial Resource Strain: Not on file  Food Insecurity: No Food Insecurity (09/17/2023)   Hunger Vital Sign    Worried About Running Out of Food in the Last Year: Never true    Ran Out of Food in the Last Year: Never true  Transportation Needs: No Transportation Needs (09/17/2023)   PRAPARE - Administrator, Civil Service (Medical): No    Lack of  Transportation (Non-Medical): No  Physical Activity: Not on file  Stress: Not on file  Social Connections: Not on file  Intimate Partner Violence: Not At Risk (09/17/2023)   Humiliation, Afraid, Rape, and Kick questionnaire    Fear of Current or Ex-Partner: No    Emotionally Abused: No    Physically Abused: No    Sexually Abused: No     Family History  Problem Relation Age of Onset   Diabetes Mellitus II Mother      Current Facility-Administered Medications:    acetaminophen  (TYLENOL ) tablet 650 mg, 650 mg, Oral, Q6H PRN, Niu, Xilin, MD   albuterol  (PROVENTIL ) (2.5 MG/3ML) 0.083% nebulizer solution 2.5 mg, 2.5 mg, Inhalation, Q4H PRN, Niu, Xilin, MD   Ampicillin -Sulbactam (UNASYN ) 3 g in sodium chloride  0.9 % 100 mL IVPB, 3 g, Intravenous, Q12H, Lenon Pons L, MD, Last Rate: 200 mL/hr at 09/20/23 1247, 3 g at 09/20/23 1247   atorvastatin  (LIPITOR) tablet 20 mg, 20 mg, Oral, QHS, Niu, Xilin, MD, 20 mg at 09/19/23 2114   Chlorhexidine  Gluconate Cloth 2 % PADS 6 each, 6 each, Topical, Daily, Marsa Edelman, DO, 6 each at 09/20/23 9073   cholecalciferol  (VITAMIN D3) 25 MCG (1000 UNIT) tablet 1,000 Units, 1,000 Units, Oral, QHS, Niu, Xilin, MD, 1,000 Units at 09/19/23 2114   dextromethorphan-guaiFENesin  (MUCINEX  DM) 30-600 MG per 12 hr tablet 1 tablet, 1 tablet, Oral, BID PRN, Niu, Xilin, MD   dolutegravir -rilpivirine  (JULUCA ) 50-25 MG per tablet 1 tablet, 1 tablet, Oral, Daily, Niu, Xilin, MD, 1 tablet at 09/20/23 9073   feeding supplement (ENSURE PLUS HIGH PROTEIN) liquid 237 mL, 237 mL, Oral, BID BM, Alexander, Natalie, DO, 237 mL at 09/20/23 9073   finasteride  (PROSCAR ) tablet 5 mg, 5 mg, Oral, Daily, Niu, Xilin, MD, 5 mg at 09/20/23 9074   folic acid  (FOLVITE ) tablet 1 mg, 1 mg, Oral, Daily, Niu, Xilin, MD, 1 mg at 09/20/23 9073   insulin  aspart (novoLOG ) injection 0-9 Units, 0-9 Units, Subcutaneous, TID WC, Niu, Xilin, MD, 2 Units at 09/20/23 1247   loperamide  (IMODIUM )  capsule 2 mg, 2 mg, Oral, QID PRN, Niu, Xilin, MD   metoprolol  succinate (TOPROL -XL) 24 hr tablet 100 mg, 100 mg, Oral, Daily, Niu, Xilin, MD, 100 mg at 09/20/23 9073   multivitamin with minerals tablet 1 tablet, 1 tablet, Oral, Daily, Alexander, Natalie, DO, 1 tablet at 09/19/23 1701   nicotine  (NICODERM CQ  - dosed in mg/24 hours) patch 21 mg, 21 mg, Transdermal, Daily, Niu, Xilin, MD, 21 mg at 09/20/23 0926   ondansetron  (ZOFRAN )  injection 4 mg, 4 mg, Intravenous, Q8H PRN, Niu, Xilin, MD   oxyCODONE  (Oxy IR/ROXICODONE ) immediate release tablet 5 mg, 5 mg, Oral, Q6H PRN, Niu, Xilin, MD, 5 mg at 09/19/23 0046   sodium bicarbonate  tablet 650 mg, 650 mg, Oral, BID, Niu, Xilin, MD, 650 mg at 09/20/23 9072   Continuous Bladder Irrigation, , , Until Discontinued **AND** sodium chloride  irrigation 0.9 % 3,000 mL, 3,000 mL, Irrigation, Continuous, Ernest Ronal BRAVO, MD, 3,000 mL at 09/19/23 0640   tamsulosin  (FLOMAX ) capsule 0.4 mg, 0.4 mg, Oral, QPC supper, Niu, Xilin, MD, 0.4 mg at 09/19/23 1701   Vilazodone  HCl TABS 20 mg, 20 mg, Oral, Daily, Niu, Xilin, MD, 20 mg at 09/20/23 0926  Review of Systems - Oncology  PHYSICAL EXAM Vitals:   09/19/23 1517 09/19/23 2044 09/20/23 0426 09/20/23 0823  BP: 131/74 131/82 (!) 142/83 (!) 150/78  Pulse: 69 80 75 77  Resp: 18  18 18   Temp: 97.9 F (36.6 C) 97.9 F (36.6 C) 97.9 F (36.6 C) 98.3 F (36.8 C)  TempSrc:      SpO2: 100% 100% 100% 100%  Weight:      Height:       Physical Exam Constitutional:      General: He is not in acute distress.    Appearance: He is not diaphoretic.  Eyes:     General: No scleral icterus. Cardiovascular:     Rate and Rhythm: Normal rate.  Pulmonary:     Effort: Pulmonary effort is normal. No respiratory distress.     Breath sounds: No wheezing.  Abdominal:     General: There is no distension.     Palpations: Abdomen is soft.     Tenderness: There is no abdominal tenderness.  Genitourinary:    Comments: +  foley Musculoskeletal:        General: Normal range of motion.  Skin:    Findings: No erythema.  Neurological:     Mental Status: He is alert and oriented to person, place, and time. Mental status is at baseline.     Cranial Nerves: No cranial nerve deficit.     Motor: No abnormal muscle tone.  Psychiatric:        Mood and Affect: Affect normal.       LABORATORY STUDIES    Latest Ref Rng & Units 09/20/2023    6:18 AM 09/19/2023    3:21 AM 09/18/2023    4:11 PM  CBC  WBC 4.0 - 10.5 K/uL 13.8  13.8  15.7   Hemoglobin 13.0 - 17.0 g/dL 8.8  9.2  9.2   Hematocrit 39.0 - 52.0 % 28.2  28.7  29.8   Platelets 150 - 400 K/uL 200  204  220       Latest Ref Rng & Units 09/20/2023    6:18 AM 09/19/2023    8:42 AM 09/18/2023    4:38 AM  CMP  Glucose 70 - 99 mg/dL 820  808  810   BUN 8 - 23 mg/dL 53  56  58   Creatinine 0.61 - 1.24 mg/dL 6.10  5.50  4.53   Sodium 135 - 145 mmol/L 140  136  134   Potassium 3.5 - 5.1 mmol/L 4.8  4.8  4.7   Chloride 98 - 111 mmol/L 107  105  102   CO2 22 - 32 mmol/L 23  22  23    Calcium  8.9 - 10.3 mg/dL 8.9  8.7  8.6  RADIOGRAPHIC STUDIES: I have personally reviewed the radiological images as listed and agreed with the findings in the report. DG OR UROLOGY CYSTO IMAGE ([facility] ONLY) Result Date: 09/19/2023 There is no interpretation for this exam.  This order is for images obtained during a surgical procedure.  Please See Surgeries Tab for more information regarding the procedure.   US  RENAL Result Date: 09/18/2023 CLINICAL DATA:  Gross hematuria. EXAM: RENAL / URINARY TRACT ULTRASOUND COMPLETE COMPARISON:  None Available. FINDINGS: Right Kidney: Renal measurements: 9.1 cm x 5.2 cm x 6.0 cm = volume: 148 mL. Diffusely increased echogenicity of the renal parenchyma is seen. No mass is visualized. There is moderate severity right-sided hydronephrosis. Left Kidney: Renal measurements: 9.5 cm x 5.4 cm x 4.1 cm = volume: 111.6 mL. Diffusely increased  echogenicity of the renal parenchyma is seen. 0.9 cm x 0.6 cm x 1.1 cm and 1.8 cm x 1.4 cm x 1.6 cm left renal cysts are noted. There is mild left-sided hydronephrosis. Bladder: A Foley catheter is in place. Large amount of air is seen within the bladder lumen. Other: None. IMPRESSION: 1. Bilateral echogenic kidneys which may represent sequelae associated with medical renal disease. 2. Small left renal cyst. 3. Bilateral hydronephrosis, right greater than left. 4. Large amount of air within the bladder lumen. Electronically Signed   By: Suzen Dials M.D.   On: 09/18/2023 20:55   CT ABDOMEN PELVIS WO CONTRAST Result Date: 09/16/2023 CLINICAL DATA:  Chest and abdominal pain EXAM: CT ABDOMEN AND PELVIS WITHOUT CONTRAST TECHNIQUE: Multidetector CT imaging of the abdomen and pelvis was performed following the standard protocol without IV contrast. RADIATION DOSE REDUCTION: This exam was performed according to the departmental dose-optimization program which includes automated exposure control, adjustment of the mA and/or kV according to patient size and/or use of iterative reconstruction technique. COMPARISON:  08/16/2023 FINDINGS: Lower chest: Mild scarring is noted in the right lung base. Hepatobiliary: Cholelithiasis is seen without complicating factors. The liver appears within normal limits. Pancreas: Scattered calcifications are noted throughout the pancreas consistent with chronic pancreatitis. The previously seen cystic change adjacent to the head of the pancreas and second portion of the duodenum is again noted and stable measuring approximately 2 cm. The more peripheral pancreas appears within normal limits. Spleen: Normal in size without focal abnormality. Adrenals/Urinary Tract: Adrenal glands are within normal limits. Persistent hydronephrosis and hydroureter is noted bilateral likely related to an over distended bladder. Foley catheter is noted in place there is considerable mixed attenuation  density within the bladder lumen consistent with air, urine and likely thrombus. Persistent bladder wall thickening is again noted with evidence of a superior bladder diverticulum. These changes likely contribute to the degree of hydronephrosis. Stomach/Bowel: Scattered fecal material is noted throughout the colon. No obstructive or inflammatory changes of the colon are seen. The appendix is partially visualized and within normal limits. Small bowel and stomach appear within normal limits. Vascular/Lymphatic: Aortic atherosclerosis. No enlarged abdominal or pelvic lymph nodes. Previously seen lymph node adjacent to the rectum is no longer identified. Reproductive: Prostate is unremarkable. Other: No abdominal wall hernia or abnormality. No abdominopelvic ascites. Musculoskeletal: Bilateral L5 pars defects are noted without significant anterolisthesis. No acute bony abnormality is noted. 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 Mixed echogenicity material is noted within the bladder representing air, urine and likely hyperdense thrombus. Correlate with Foley output. The over distention of the bladder contributes to the bilateral hydronephrosis which is similar to that seen on  the prior exam. Diffuse bladder wall thickening is noted suspicious for underlying neoplasm. Correlate with clinical history. Cholelithiasis without complicating factors. Chronic pancreatitis with a 2 cm pancreatic pseudocyst in the region of the head stable from the prior study. Electronically Signed   By: Oneil Devonshire M.D.   On: 09/16/2023 20:25   DG Chest 1 View Result Date: 09/16/2023 CLINICAL DATA:  Chest pain. EXAM: CHEST  1 VIEW COMPARISON:  July 29, 2023 FINDINGS: The heart size and mediastinal contours are within normal limits. Mild, stable linear scarring and/or atelectasis is seen along the periphery of the right lung base. No acute infiltrate, pleural effusion or pneumothorax is identified. The  visualized skeletal structures are unremarkable. IMPRESSION: Mild, stable right basilar linear scarring and/or atelectasis. Electronically Signed   By: Suzen Dials M.D.   On: 09/16/2023 14:51   CT Chest Wo Contrast Result Date: 09/05/2023 CLINICAL DATA:  Pancreatic cyst. EXAM: CT CHEST WITHOUT CONTRAST TECHNIQUE: Multidetector CT imaging of the chest was performed following the standard protocol without IV contrast. RADIATION DOSE REDUCTION: This exam was performed according to the departmental dose-optimization program which includes automated exposure control, adjustment of the mA and/or kV according to patient size and/or use of iterative reconstruction technique. COMPARISON:  04/20/2019. FINDINGS: Cardiovascular: Ascending thoracic aorta aneurysm maximal at a diameter 4.1 cm. Ectatic aorta at the arch to 3.5 cm. Atheromatous changes with calcifications of aorta and coronary arteries. 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 Mediastinum/Nodes: No enlarged mediastinal or axillary lymph nodes. Thyroid gland, trachea, and esophagus demonstrate no significant findings. Lungs/Pleura: Subsegmental atelectasis or scarring in the right middle and lower lobes laterally. Right middle lobe nodule measuring 4 mm is unchanged. No pneumonia or pulmonary edema. No pneumothorax or pleural effusion. Upper Abdomen: Calcifications of partially imaged pancreas consistent with chronic pancreatitis. Right-sided hydronephrosis; right kidney was only partially imaged. Musculoskeletal: No chest wall mass or suspicious bone lesions identified. IMPRESSION: 1. Right middle lobe nodule stable for several years consistent with a benign process. 2. Ascending thoracic aortic aneurysm, 4.1 cm. 3. Chronic pancreatitis. 4. Right-sided hydronephrosis. Recommend dedicated imaging of the kidneys. 5. Aortic atherosclerosis (ICD10-I70.0). Electronically Signed   By: Fonda Field M.D.   On: 09/05/2023  09:19   CT ABDOMEN PELVIS WO CONTRAST Result Date: 08/16/2023 CLINICAL DATA:  Constant central lower abdominal pain for the past several weeks, worsening. Urinary tract infection diagnosed 1 week ago. EXAM: CT ABDOMEN AND PELVIS WITHOUT CONTRAST TECHNIQUE: Multidetector CT imaging of the abdomen and pelvis was performed following the standard protocol without IV contrast. RADIATION DOSE REDUCTION: This exam was performed according to the departmental dose-optimization program which includes automated exposure control, adjustment of the mA and/or kV according to patient size and/or use of iterative reconstruction technique. COMPARISON:  07/06/2023 FINDINGS: Lower chest: Interval pleural thickening and nodularity along the posterolateral right lower lobe in the area of previously demonstrated ground-glass opacity. The ground-glass opacity has resolved. Clear left lung base. Borderline enlarged heart. Hepatobiliary: Normal-appearing liver. Multiple gallstones are again demonstrated in the gallbladder, measuring up to 9 mm in diameter each. No gallbladder wall thickening or pericholecystic fluid. Pancreas: Stable extensive coarse pancreatic calcifications compatible with chronic pancreatitis. No evidence of acute pancreatitis. Spleen: Normal in size without focal abnormality. Adrenals/Urinary Tract: Normal-appearing adrenal glands interval moderate dilatation of the right renal collecting system and ureter to the level of the ureterovesical junction. No obstructing calculus visualized. Stable grossly simple appearing lower pole left renal cyst  measuring 16 Hounsfield units in density on coronal image number 37/5. Unremarkable left ureter. Marked diffuse medium density wall thickening involving the urinary bladder with a Foley catheter in place is again demonstrated. There are 2 clips again demonstrated in the posterior bladder on the left. Stomach/Bowel: The rectum is distended with stool with no rectal wall  thickening. Prominent stool in the sigmoid colon. Normal-appearing appendix. The stomach is unremarkable. The previously demonstrated 3.4 cm cystic mass in the region of the duodenal C-loop is smaller, currently measuring 2.2 cm in maximum diameter. Vascular/Lymphatic: Atheromatous arterial calcifications without aneurysm. Interval enlarged lymph node posterior to the urinary bladder on the right and lateral to the rectum on the right, measuring 1.3 cm in short axis diameter on image number 63/2. Reproductive: Penile prosthesis.  Normal-sized prostate gland. Other: No abdominal wall hernia or abnormality. No abdominopelvic ascites. Musculoskeletal: Minimal lumbar spine degenerative changes. Bilateral L5 pars interarticularis defects without subluxation. IMPRESSION: 1. Interval moderate hydronephrosis and hydroureter to the level of the ureterovesical junction. No obstructing calculus visualized. This is most likely due to the marked diffuse bladder wall thickening. 2. Marked diffuse medium density wall thickening involving the urinary bladder with a Foley catheter in place. This is suspicious for a bladder neoplasm. 3. Interval enlarged lymph node posterior to the urinary bladder on the right, suspicious for a metastatic node. 4. Interval pleural thickening and nodularity along the posterolateral right lower lobe in the area of previously demonstrated ground-glass opacity. The ground-glass opacity has resolved. This is nonspecific and could be due to developing pleural and parenchymal scarring or pleural neoplasm. 5. Cholelithiasis. 6. Stable changes of chronic pancreatitis. 7. The previously demonstrated 3.4 cm cystic mass in the region of the duodenal C-loop is smaller, currently measuring 2.2 cm in maximum diameter. This suggests a pancreatic pseudocyst which has improved. 8. Prominent stool in the sigmoid colon and rectum. Electronically Signed   By: Elspeth Bathe M.D.   On: 08/16/2023 16:32   US  Venous Img  Lower Bilateral (DVT) Result Date: 07/31/2023 CLINICAL DATA:  Pulmonary embolism.  Assess for residual DVT. EXAM: BILATERAL LOWER EXTREMITY VENOUS DOPPLER ULTRASOUND TECHNIQUE: Gray-scale sonography with graded compression, as well as color Doppler and duplex ultrasound were performed to evaluate the lower extremity deep venous systems from the level of the common femoral vein and including the common femoral, femoral, profunda femoral, popliteal and calf veins including the posterior tibial, peroneal and gastrocnemius veins when visible. The superficial great saphenous vein was also interrogated. Spectral Doppler was utilized to evaluate flow at rest and with distal augmentation maneuvers in the common femoral, femoral and popliteal veins. COMPARISON:  None Available. FINDINGS: RIGHT LOWER EXTREMITY Common Femoral Vein: No evidence of thrombus. Normal compressibility, respiratory phasicity and response to augmentation. Saphenofemoral Junction: No evidence of thrombus. Normal compressibility and flow on color Doppler imaging. Profunda Femoral Vein: No evidence of thrombus. Normal compressibility and flow on color Doppler imaging. Femoral Vein: No evidence of thrombus. Normal compressibility, respiratory phasicity and response to augmentation. Popliteal Vein: No evidence of thrombus. Normal compressibility, respiratory phasicity and response to augmentation. Calf Veins: No evidence of thrombus. Normal compressibility and flow on color Doppler imaging. Superficial Great Saphenous Vein: No evidence of thrombus. Normal compressibility. Venous Reflux:  None. Other Findings:  None. LEFT LOWER EXTREMITY Common Femoral Vein: No evidence of thrombus. Normal compressibility, respiratory phasicity and response to augmentation. Saphenofemoral Junction: No evidence of thrombus. Normal compressibility and flow on color Doppler imaging. Profunda Femoral Vein: No evidence of  thrombus. Normal compressibility and flow on color  Doppler imaging. Femoral Vein: Noncompressibility of the femoral vein in the distal thigh. The lumen is expanded and filled with low-level internal echoes. No evidence of color flow on color Doppler imaging. Findings suggest acute occlusive thrombus. Popliteal Vein: Acute occlusive thrombus extends through the popliteal vein. Calf Veins: Acute occlusive thrombus extends into the peroneal veins. Superficial Great Saphenous Vein: No evidence of thrombus. Normal compressibility. Venous Reflux:  None. Other Findings:  None. IMPRESSION: 1. Positive for acute appearing and occlusive thrombus within the femoral vein in the left distal thigh, the left popliteal vein and the left peroneal veins. 2. No evidence of DVT in the right lower extremity. Electronically Signed   By: Wilkie Lent M.D.   On: 07/31/2023 09:42   ECHOCARDIOGRAM COMPLETE Result Date: 07/30/2023    ECHOCARDIOGRAM REPORT   Patient Name:   BASIR NIVEN Date of Exam: 07/30/2023 Medical Rec #:  [id number]         Height:       68.0 in Accession #:    [id number]        Weight:       111.3 lb Date of Birth:  [birth year]          BSA:          1.593 m Patient Age:    63 years          BP:           105/61 mmHg Patient Gender: M                 HR:           75 bpm. Exam Location:  [facility] Procedure: 2D Echo, Cardiac Doppler and Color Doppler (Both Spectral and Color            Flow Doppler were utilized during procedure). Indications:     NSTEMI I21.4                  Chest pain R07.9  History:         Patient has no prior history of Echocardiogram examinations.                  COPD; Risk Factors:Hypertension. Tobacco use.  Sonographer:     Christopher Furnace Referring Phys:  [id number] AMY N COX Diagnosing Phys: Lonni End MD  Sonographer Comments: Technically challenging study due to limited acoustic windows and no apical window. Image acquisition challenging due to COPD and Image acquisition challenging due to patient body habitus. IMPRESSIONS  1. Left  ventricular ejection fraction, by estimation, is 60 to 65%. The left ventricle has normal function. Left ventricular endocardial border not optimally defined to evaluate regional wall motion. Left ventricular diastolic function could not be evaluated.  2. Right ventricular systolic function is moderately reduced. The right ventricular size is normal. There is normal pulmonary artery systolic pressure.  3. The mitral valve is normal in structure. Trivial mitral valve regurgitation.  4. The aortic valve is tricuspid. There is moderate thickening of the aortic valve. Aortic valve regurgitation is mild. Aortic valve gradient not assessed due to absent apical windows.  5. The inferior vena cava is normal in size with greater than 50% respiratory variability, suggesting right atrial pressure of 3 mmHg. FINDINGS  Left Ventricle: Left ventricular ejection fraction, by estimation, is 60 to 65%. The left ventricle has normal function. Left ventricular endocardial border not optimally defined to evaluate regional wall motion.  The left ventricular internal cavity size was normal in size. There is no left ventricular hypertrophy. Left ventricular diastolic function could not be evaluated. Right Ventricle: The right ventricular size is normal. No increase in right ventricular wall thickness. Right ventricular systolic function is moderately reduced. There is normal pulmonary artery systolic pressure. The tricuspid regurgitant velocity is 2.10 m/s, and with an assumed right atrial pressure of 3 mmHg, the estimated right ventricular systolic pressure is 20.7 mmHg. Left Atrium: Left atrial size was not well visualized. Right Atrium: Right atrial size was not well visualized. Pericardium: There is no evidence of pericardial effusion. Mitral Valve: The mitral valve is normal in structure. Trivial mitral valve regurgitation. Tricuspid Valve: The tricuspid valve is normal in structure. Tricuspid valve regurgitation is mild. Aortic  Valve: The aortic valve is tricuspid. There is moderate thickening of the aortic valve. Aortic valve regurgitation is mild. Aortic valve gradient not assessed due to absent apical windows. Pulmonic Valve: The pulmonic valve was thickened with good excursion. Pulmonic valve regurgitation is trivial. No evidence of pulmonic stenosis. Aorta: The aortic root is normal in size and structure. Venous: The inferior vena cava is normal in size with greater than 50% respiratory variability, suggesting right atrial pressure of 3 mmHg. IAS/Shunts: No atrial level shunt detected by color flow Doppler.  LEFT VENTRICLE PLAX 2D LVIDd:         3.30 cm LVIDs:         2.40 cm LV PW:         0.80 cm LV IVS:        1.00 cm LVOT diam:     2.10 cm LVOT Area:     3.46 cm  LEFT ATRIUM         Index LA diam:    2.40 cm 1.51 cm/m   AORTA Ao Root diam: 3.40 cm TRICUSPID VALVE TR Peak grad:   17.7 mmHg TR Vmax:        210.15 cm/s  SHUNTS Systemic Diam: 2.10 cm Lonni Hanson MD Electronically signed by Lonni Hanson MD Signature Date/Time: 07/30/2023/1:24:06 PM    Final    NM Pulmonary Perfusion Result Date: 07/29/2023 CLINICAL DATA:  Short of breath.  Concern for pulmonary embolism EXAM: NUCLEAR MEDICINE PERFUSION LUNG SCAN TECHNIQUE: Perfusion images were obtained in multiple projections after intravenous injection of radiopharmaceutical. RADIOPHARMACEUTICALS:  4.4 mCi Tc-68m MAA COMPARISON:  Chest radiograph 07/29/2018 FINDINGS: Wedge-shaped peripheral perfusion defect within the lateral RIGHT lower lobe and RIGHT middle lobe. No perfusion defects in LEFT lung. Subtle opacity in the RIGHT lung base. Perfusion defect greater than the opacity volume. Potential pulmonary infarction radiograph. IMPRESSION: Wedge-shaped peripheral perfusion defects not matched on radiograph. Findings concerning for acute pulmonary embolism the RIGHT lower lobe. Electronically Signed   By: Jackquline Boxer M.D.   On: 07/29/2023 14:14   DG Chest 2  View Result Date: 07/29/2023 CLINICAL DATA:  Chest pain EXAM: CHEST - 2 VIEW COMPARISON:  X-ray 07/06/2023. FINDINGS: No edema, pneumothorax or effusion. Normal cardiopericardial silhouette. Subtle opacity however the right lung base. Overlapping cardiac leads. IMPRESSION: Subtle opacity at the right lung base, new from previous. Please correlate for a subtle infiltrate. Short follow-up versus CT as clinically appropriate. Electronically Signed   By: Ranell Bring M.D.   On: 07/29/2023 12:33   DG Chest 2 View Result Date: 07/06/2023 CLINICAL DATA:  Chest pain.  History of prostate cancer and AIDS. EXAM: CHEST - 2 VIEW COMPARISON:  04/24/2023 FINDINGS: Normal sized heart. Mild  patchy opacity at the posterior lung bases on the lateral view and mild patchy opacity in the right lower lobe. Mild-to-moderate central peribronchial thickening. Unremarkable bones. IMPRESSION: 1. Mild bibasilar and right lower lobe patchy atelectasis or pneumonia. 2. Mild-to-moderate central bronchitic changes. Electronically Signed   By: Elspeth Bathe M.D.   On: 07/06/2023 11:09   CT ABDOMEN PELVIS WO CONTRAST Result Date: 07/06/2023 CLINICAL DATA:  63 year old male with acute abdominal pain. EXAM: CT ABDOMEN AND PELVIS WITHOUT CONTRAST TECHNIQUE: Multidetector CT imaging of the abdomen and pelvis was performed following the standard protocol without IV contrast. RADIATION DOSE REDUCTION: This exam was performed according to the departmental dose-optimization program which includes automated exposure control, adjustment of the mA and/or kV according to patient size and/or use of iterative reconstruction technique. COMPARISON:  CT Abdomen 12/21/2008. FINDINGS: Lower chest: Elevation of the left hemidiaphragm is new since 2010, and from portable chest x-ray this year 04/24/2023. No cardiomegaly. No pericardial effusion. Confluent right lower lobe, costophrenic angle pulmonary ground-glass opacity. No other significant lung base opacity.  Hepatobiliary: Cholelithiasis within a contracted gallbladder. No pericholecystic inflammation. Negative noncontrast liver. No bile duct enlargement is evident. Pancreas: Highly abnormal. Subtotal pancreatic atrophy superimposed on extensive parenchymal dystrophic calcifications and dilated main pancreatic duct. These findings terminates at the duodenum C-loop where a rounded cystic mass is present on series 2, image 28 and further described below. No peripancreatic inflammation. Spleen: The entire superior pole of the spleen is not included but the visible noncontrast spleen appears negative. Adrenals/Urinary Tract: Normal adrenal glands. Nonobstructed kidneys. Diminutive ureters. Abnormal urinary bladder. A Foley catheter is within the bladder lumen. The base of the bladder is decompressed but there is severe abnormal anterior bladder wall thickening and a spiculated appearance which is indeterminate for inflammation (series 2, image 66). Abnormal thickening there of slightly over 3 cm as seen on coronal image 30. The dome of the bladder contains gas and is non thickened. See sagittal image 84. Pelvic phleboliths. Stomach/Bowel: Redundant but decompressed large bowel in the pelvis. Mild descending colon retained stool. Moderate transverse colon gaseous distension more so than retained stool. Decompressed flexures. Retained stool in the right colon without abnormal distention. Normal gas containing appendix on coronal image 44. Nondilated small bowel. Stomach is also mostly decompressed. However, there is a rounded 3.4 cm cystic mass with simple fluid density at the gastroduodenal junction, adjacent to abnormal pancreas (series 2, image 28 and coronal image 34), new since 2010. No regional inflammation. However, this may be a portion of the larger cystic mass which has an hourglass configuration (coronal image 33 uncertain). Duodenum is nondilated. No pneumoperitoneum. No free fluid. No mesenteric inflammation  identified. Vascular/Lymphatic: Aortoiliac calcified atherosclerosis. Normal caliber abdominal aorta. Vascular patency is not evaluated in the absence of IV contrast. No lymphadenopathy identified. Reproductive: Urethral catheter in place. Other: No pelvis free fluid. Musculoskeletal: No acute or suspicious osseous lesion. IMPRESSION: ABDOMEN: 1. Extensive pancreatic atrophy and dystrophic calcification with dilated main pancreatic duct terminating at a Cystic Mass at the duodenal C-loop (3.4 cm unilocular versus larger hour glass shaped, see coronal images 33 and 34), which is new since 2010. No regional inflammation or lymphadenopathy. Differential consideration includes obstructing cystic neoplasm, pseudocyst, choledochal cyst, enteric cyst. 2. Gallstones within contracted gallbladder. No CT evidence of acute cholecystitis or hepatic bile duct obstruction. PELVIS: 1. Bulky tumor versus inflammatory wall thickening at the anterior base of the urinary bladder, 3.2 cm in thickness. Foley catheter within the bladder. No regional lymphadenopathy.  Kidneys and ureters appear nonobstructed. CHEST: 1. Right lower lobe pulmonary ground-glass opacity more suspicious for infection than atelectasis. No pleural fluid. 2. Elevation of the left hemidiaphragm is new since 2010 and from portable chest x-ray this year. 3.  Aortic Atherosclerosis (ICD10-I70.0). Electronically Signed   By: VEAR Hurst M.D.   On: 07/06/2023 10:34     Assessment and plan-   # Acute on chronic anemia due to blood loss, anemia and chronic kidney disease. He received 3 dosage of IV Venofer . Recommend 1 extra dose of Venofer  treatments to further improve iron  stores. Transfuse PRBC to keep hemoglobin above 7. Hold off erythropoietin due to high suspicion of malignancy.  # Bladder mass, status post TURP Awaiting for pathology.  Differential includes urothelial carcinoma, lymphoma, metastatic prostate cancer etc.. CBI per urology.  # Urinary  retention, AKI on CKD, due to postrenal obstruction. IV fluid for supportive care.  He has urinary tract obstruction due to bladder tumor, recommend bilateral nephrostomy tube to stabilize his kidney function.  # Malnutrition, continue nutritional supplements. Patient is very frail. Consult palliative care service. Further recommendation pending pathology report.  No chemotherapy planned during admission.  He will follow-up outpatient in the cancer center for discussion of further plan. Thank you for allowing me to participate in the care of this patient.   Zelphia Cap, MD, PhD Hematology Oncology 09/20/2023

## 2023-09-20 NOTE — Progress Notes (Signed)
 Urology Inpatient Progress Note  Subjective: No acute events overnight. He is afebrile, VSS. White count stable, 13.8.  Hemoglobin slightly down, 8.8.  Creatinine down, 3.89. Surgical pathology pending. Foley catheter in place draining clear urine on slow drip CBI.  There is tissue/debris in the urine. He reports his abdominal pain has resolved.  Anti-infectives: Anti-infectives (From admission, onward)    Start     Dose/Rate Route Frequency Ordered Stop   09/20/23 1115  Ampicillin -Sulbactam (UNASYN ) 3 g in sodium chloride  0.9 % 100 mL IVPB        3 g 200 mL/hr over 30 Minutes Intravenous Every 12 hours 09/20/23 1032     09/20/23 0600  ceFAZolin  (ANCEF ) IVPB 1 g/50 mL premix  Status:  Discontinued        1 g 100 mL/hr over 30 Minutes Intravenous Every 12 hours 09/19/23 1106 09/20/23 1032   09/17/23 1000  dolutegravir -rilpivirine  (JULUCA ) 50-25 MG per tablet 1 tablet        1 tablet Oral Daily 09/17/23 0147     09/17/23 0600  cefTRIAXone  (ROCEPHIN ) 2 g in sodium chloride  0.9 % 100 mL IVPB  Status:  Discontinued        2 g 200 mL/hr over 30 Minutes Intravenous Every 24 hours 09/16/23 2207 09/19/23 1106   09/16/23 1645  ceFEPIme  (MAXIPIME ) 2 g in sodium chloride  0.9 % 100 mL IVPB        2 g 200 mL/hr over 30 Minutes Intravenous  Once 09/16/23 1639 09/16/23 1840   09/16/23 1645  metroNIDAZOLE  (FLAGYL ) IVPB 500 mg        500 mg 100 mL/hr over 60 Minutes Intravenous  Once 09/16/23 1639 09/16/23 1840       Current Facility-Administered Medications  Medication Dose Route Frequency Provider Last Rate Last Admin   acetaminophen  (TYLENOL ) tablet 650 mg  650 mg Oral Q6H PRN Niu, Xilin, MD       albuterol  (PROVENTIL ) (2.5 MG/3ML) 0.083% nebulizer solution 2.5 mg  2.5 mg Inhalation Q4H PRN Niu, Xilin, MD       Ampicillin -Sulbactam (UNASYN ) 3 g in sodium chloride  0.9 % 100 mL IVPB  3 g Intravenous Q12H Lenon Marien CROME, MD       atorvastatin  (LIPITOR) tablet 20 mg  20 mg Oral QHS Niu,  Xilin, MD   20 mg at 09/19/23 2114   Chlorhexidine  Gluconate Cloth 2 % PADS 6 each  6 each Topical Daily Alexander, Natalie, DO   6 each at 09/20/23 9073   cholecalciferol  (VITAMIN D3) 25 MCG (1000 UNIT) tablet 1,000 Units  1,000 Units Oral QHS Niu, Xilin, MD   1,000 Units at 09/19/23 2114   dextromethorphan-guaiFENesin  (MUCINEX  DM) 30-600 MG per 12 hr tablet 1 tablet  1 tablet Oral BID PRN Niu, Xilin, MD       dolutegravir -rilpivirine  (JULUCA ) 50-25 MG per tablet 1 tablet  1 tablet Oral Daily Niu, Xilin, MD   1 tablet at 09/20/23 0926   feeding supplement (ENSURE PLUS HIGH PROTEIN) liquid 237 mL  237 mL Oral BID BM Alexander, Natalie, DO   237 mL at 09/20/23 9073   finasteride  (PROSCAR ) tablet 5 mg  5 mg Oral Daily Niu, Xilin, MD   5 mg at 09/20/23 9074   folic acid  (FOLVITE ) tablet 1 mg  1 mg Oral Daily Niu, Xilin, MD   1 mg at 09/20/23 9073   insulin  aspart (novoLOG ) injection 0-9 Units  0-9 Units Subcutaneous TID WC Niu, Xilin, MD   2 Units  at 09/20/23 9074   loperamide  (IMODIUM ) capsule 2 mg  2 mg Oral QID PRN Niu, Xilin, MD       metoprolol  succinate (TOPROL -XL) 24 hr tablet 100 mg  100 mg Oral Daily Niu, Xilin, MD   100 mg at 09/20/23 9073   multivitamin with minerals tablet 1 tablet  1 tablet Oral Daily Alexander, Natalie, DO   1 tablet at 09/19/23 1701   nicotine  (NICODERM CQ  - dosed in mg/24 hours) patch 21 mg  21 mg Transdermal Daily Niu, Xilin, MD   21 mg at 09/20/23 9073   ondansetron  (ZOFRAN ) injection 4 mg  4 mg Intravenous Q8H PRN Niu, Xilin, MD       oxyCODONE  (Oxy IR/ROXICODONE ) immediate release tablet 5 mg  5 mg Oral Q6H PRN Niu, Xilin, MD   5 mg at 09/19/23 0046   sodium bicarbonate  tablet 650 mg  650 mg Oral BID Niu, Xilin, MD   650 mg at 09/20/23 9072   sodium chloride  irrigation 0.9 % 3,000 mL  3,000 mL Irrigation Continuous Funke, Mary E, MD   3,000 mL at 09/19/23 9359   tamsulosin  (FLOMAX ) capsule 0.4 mg  0.4 mg Oral QPC supper Niu, Xilin, MD   0.4 mg at 09/19/23 1701    Vilazodone  HCl TABS 20 mg  20 mg Oral Daily Niu, Xilin, MD   20 mg at 09/20/23 9073   Objective: Vital signs in last 24 hours: Temp:  [97.8 F (36.6 C)-98.3 F (36.8 C)] 98.3 F (36.8 C) (08/15 0823) Pulse Rate:  [65-80] 77 (08/15 0823) Resp:  [14-22] 18 (08/15 0823) BP: (124-150)/(73-83) 150/78 (08/15 0823) SpO2:  [100 %] 100 % (08/15 0823) Weight:  [47.6 kg] 47.6 kg (08/14 1312)  Intake/Output from previous day: 08/14 0701 - 08/15 0700 In: 4650  Out: 6975 [Urine:6975] Intake/Output this shift: Total I/O In: 3000 [Other:3000] Out: -   Physical Exam Vitals and nursing note reviewed.  Constitutional:      General: He is not in acute distress.    Appearance: He is not ill-appearing, toxic-appearing or diaphoretic.  HENT:     Head: Normocephalic and atraumatic.  Pulmonary:     Effort: Pulmonary effort is normal. No respiratory distress.  Skin:    General: Skin is warm and dry.  Neurological:     Mental Status: He is alert.  Psychiatric:        Mood and Affect: Mood normal.        Behavior: Behavior normal.    Lab Results:  Recent Labs    09/19/23 0321 09/20/23 0618  WBC 13.8* 13.8*  HGB 9.2* 8.8*  HCT 28.7* 28.2*  PLT 204 200   BMET Recent Labs    09/19/23 0842 09/20/23 0618  NA 136 140  K 4.8 4.8  CL 105 107  CO2 22 23  GLUCOSE 191* 179*  BUN 56* 53*  CREATININE 4.49* 3.89*  CALCIUM  8.7* 8.9   Studies/Results: DG OR UROLOGY CYSTO IMAGE ([facility] ONLY) Result Date: 09/19/2023 There is no interpretation for this exam.  This order is for images obtained during a surgical procedure.  Please See Surgeries Tab for more information regarding the procedure.   US  RENAL Result Date: 09/18/2023 CLINICAL DATA:  Gross hematuria. EXAM: RENAL / URINARY TRACT ULTRASOUND COMPLETE COMPARISON:  None Available. FINDINGS: Right Kidney: Renal measurements: 9.1 cm x 5.2 cm x 6.0 cm = volume: 148 mL. Diffusely increased echogenicity of the renal parenchyma is seen. No mass  is visualized. There is moderate  severity right-sided hydronephrosis. Left Kidney: Renal measurements: 9.5 cm x 5.4 cm x 4.1 cm = volume: 111.6 mL. Diffusely increased echogenicity of the renal parenchyma is seen. 0.9 cm x 0.6 cm x 1.1 cm and 1.8 cm x 1.4 cm x 1.6 cm left renal cysts are noted. There is mild left-sided hydronephrosis. Bladder: A Foley catheter is in place. Large amount of air is seen within the bladder lumen. Other: None. IMPRESSION: 1. Bilateral echogenic kidneys which may represent sequelae associated with medical renal disease. 2. Small left renal cyst. 3. Bilateral hydronephrosis, right greater than left. 4. Large amount of air within the bladder lumen. Electronically Signed   By: Suzen Dials M.D.   On: 09/18/2023 20:55   Assessment & Plan: 63 y.o. male with PMH prostate cancer s/p IMRT on 6 months of ADT, BPH with urinary retention on finasteride  managed with chronic Foley catheter, and recent history of intermittent gross hematuria now admitted with gross hematuria with clot retention and AKI with bilateral hydronephrosis, POD 1 from cystoscopy and TURBT with Dr. 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  UOs not identified intraoperatively.  I had a very frank conversation with the patient at the bedside today.  We discussed 3 major issues: His hematuria, his AKI, and his bladder tumors.  We discussed that his multifocal bladder tumors are concerning for aggressive malignancy, especially given that he had a negative cystoscopy less than a year ago.  Pathology remains pending and would determine treatment options.  We discussed that with subtotal resection of his bladder tumors intraoperatively yesterday, I expect he will continue to have intermittent gross hematuria, and he may continue to require blood transfusions to manage his blood counts secondary to this.  We also discussed that continued Foley catheter is likely exacerbating his bleeding  due to physical irritation of the tumors by the catheter tip.  We discussed consideration of bilateral nephrostomy tubes, which would preserve his renal function.  If the urine were sufficiently diverted into PCNs, we could possibly remove his urethral catheter, which would hopefully improve his hematuria.  However, we discussed that PCNs can be uncomfortable, especially if he is laying supine.  I recommended getting oncology on board to discuss options for management of his bladder tumors including possible chemotherapy.  If he wanted to pursue chemotherapy, he would need to undergo bilateral nephrostomy tube placement to preserve his renal function.  I do not think he would be a candidate for chemo with his persistent AKI as is.  He states today that he makes his own medical decisions and he would like to defer nephrostomy tube placement for now, since his renal function is slowly improving and his abdominal pain has resolved.  He is not able to articulate any further desires for scope of care.   At this point, I would recommend that we also get palliative care on board to help make these decisions.  I made several phone calls today to staff at his group home.  I was ultimately notified that he does not make his own medical decisions, and his HCPOA is Lennette Dade at [phone].  I called her number and left a voicemail asking for her to return my call.  I have not heard anything back yet.  His urine has cleared considerably.  I clamped his CBI for now.  Recommendations: -Keep CBI clamped for now, may resume if his gross hematuria recurs.  Keep CBI titrated to keep efflux light pink or clear. - Consult  oncology, palliative care to help define scope of treatment - Continue attempts to contact HCPOA [name] as above - Follow surgical pathology, trend BMP and CBC  Lucie Hones, PA-C 09/20/2023

## 2023-09-20 NOTE — Plan of Care (Signed)
  Problem: Coping: Goal: Ability to adjust to condition or change in health will improve Outcome: Progressing   Problem: Fluid Volume: Goal: Ability to maintain a balanced intake and output will improve Outcome: Progressing   Problem: Health Behavior/Discharge Planning: Goal: Ability to identify and utilize available resources and services will improve Outcome: Progressing   Problem: Metabolic: Goal: Ability to maintain appropriate glucose levels will improve Outcome: Progressing   Problem: Skin Integrity: Goal: Risk for impaired skin integrity will decrease Outcome: Progressing   Problem: Tissue Perfusion: Goal: Adequacy of tissue perfusion will improve Outcome: Progressing   Problem: Health Behavior/Discharge Planning: Goal: Ability to manage health-related needs will improve Outcome: Progressing   Problem: Clinical Measurements: Goal: Ability to maintain clinical measurements within normal limits will improve Outcome: Progressing   Problem: Activity: Goal: Risk for activity intolerance will decrease Outcome: Progressing   Problem: Nutrition: Goal: Adequate nutrition will be maintained Outcome: Progressing

## 2023-09-20 NOTE — Progress Notes (Signed)
 PROGRESS NOTE Jay Flores    DOB: 10/29/1960, 63 y.o.  FMW:[id number]    Code Status: Full Code   DOA: 09/16/2023   LOS: 4  Brief hospital course  Jay Flores is a 63 y.o. male, group home resident,  with medical history significant of hypertension, hyperlipidemia, diabetes mellitus, COPD, CKD-3B, HIV (CD 258 on 03/29/17 and VL 70 on 04/25/23), HIV dementia HCV (s/p of Harvoni), thrombocytopenia, prostate cancer, indwelling Foley catheter placement, iron  deficiency anemia, DVT/PE on Eliquis , who presents to ED  08/11 with lower abdominal pain and hematuria. 08/11: to ED, CT (+)large distended bladder with superior bladder wall diverticulum, progressive or worsening bilateral hydroureteronephrosis. WBC 22.8, worsening renal function, troponin 23 --> 24, lactic acid of 5.4 --> 5.3. Urology consult - 74 French catheter was removed and exchanged for a 22 [place] three-way Foley catheter, irrigated with about a liter and a half of saline and evacuated significant amount of clot, placed on CBI with symptoms improved.   09/20/23 -urine has gradually been clearing. Abdominal pain is resolved. Received 2nd u pRBC yesterday and hgb stable today. Patient tolerated cystoscopy well. Biopsy results pending. Oncology consulted to follow for results.   Assessment & Plan  Principal Problem:   Acute kidney injury superimposed on stage 3b chronic kidney disease (HCC) Active Problems:   Bilateral hydronephrosis   Acute urinary retention   Bladder wall thickening   DVT (deep venous thrombosis)_left leg   Pulmonary embolism (HCC)   CAD (coronary artery disease)   HTN (hypertension)   Type II diabetes mellitus with renal manifestations (HCC)   HLD (hyperlipidemia)   HIV (human immunodeficiency virus infection) (HCC)   Elevated lactic acid level   Anemia in chronic kidney disease (CKD)   Tobacco use   Protein-calorie malnutrition, severe  AKI superimposed on stage 3b chronic kidney disease with  bilateral hydronephrosis due to acute urinary retention 2/2 to possible bladder thrombus / clot in Foley, question UTI. Renal US  positive for: 1. Bilateral echogenic kidneys which may represent sequelae associated with medical renal disease. 2. Small left renal cyst. 3. Bilateral hydronephrosis, right greater than left. Baseline creatinine 2.62 on 08/16/2023.  Since admission has trended 7.03>>> 3.89 s/p foley replacement.  - urology following.  - continue foley  - cystoscopy with clot evacuation 8/14. Biopsy taken with suspicion for malignancy on inspection.  - oncology consulted.  - continue IV Abx, UxCx positive for klebsiella pneumoniae and e faecalis. Cefazolini changed to unasyn  - monitor BMP. If not continuing to trend toward baseline after obstruction has been cleared, consult nephrology.     Hematuria  CT shows large clot within the bladder, clinically significant, causing obstruction/retention, question if consolidated and infected.  CT showed diffuse bladder wall thickening, clinically significant, suspicious for underlying neoplasm Hold Eliquis , restart when hgb stable. Consider restart eliquis  tomorrow if hgb remains stable and no further procedures planned by urology  Continuous bladder irrigation Urology following- procedures today as above Continue foley   ABLA d/t hematuria see above Anemia chronic disease- hgb goal 8 or greater.  Treat underlying cause as above  Hold eliquis   Transfuse <2nd u pRBCs 8/13, hgb remains stable at 8.8 today Ordering venofer  per hematology recs   Prostate cancer status post external beam radiation, completed in June 2025.  Chronic urinary retention managed with a Foley catheter. first noted to be in urinary retention in August 2022. He has his catheter exchanged every 4 weeks in the urology clinic.  Continue Proscar  and Flomax   DVT left leg and pulmonary embolism  Temporarily hold Eliquis  SCDs   CAD  HLD Troponin 23 --> 26, possibly  due to demand ischemia. Continue Lipitor   HTN Hold Cozaar  due to worsening renal function. BP has maintained WNL   Type II diabetes mellitus with renal manifestations [facility]): Recent A1c 6.3, well-controlled.   Patient is taking Jardiance and metformin SSI   HIV (human immunodeficiency virus infection) (HCC) Continue Juluca     Tobacco use Nicotine  patch  Body mass index is 15.96 kg/m.  VTE ppx: Place and maintain sequential compression device Start: 09/17/23 1256 SCDs Start: 09/16/23 2126  Diet:     Diet   Diet regular Room service appropriate? Yes; Fluid consistency: Thin   Consultants: Urology   Subjective 09/20/23    Pt reports feeling well. Denies dysuria, pelvic pain. No concerns today.    Objective  Blood pressure 136/83, pulse 73, temperature (!) 97.3 F (36.3 C), resp. rate 18, height 5' 8 (1.727 m), weight 47.6 kg, SpO2 100%.  Intake/Output Summary (Last 24 hours) at 09/20/2023 0800 Last data filed at 09/20/2023 0500 Gross per 24 hour  Intake 1650 ml  Output 3975 ml  Net -2325 ml   Filed Weights   09/16/23 1345 09/19/23 1312  Weight: 47.6 kg 47.6 kg     Physical Exam:  General: awake, alert, NAD, frail HEENT: atraumatic, clear conjunctiva, anicteric sclera, MMM, hearing grossly normal Respiratory: normal respiratory effort. Cardiovascular: quick capillary refill Gastrointestinal: soft, NT, ND Nervous: A&O x3. no gross focal neurologic deficits, normal speech Extremities: moves all equally, no edema, normal tone Skin: dry, intact, normal temperature, normal color. No rashes, lesions or ulcers on exposed skin Psychiatry: normal mood, congruent affect  Labs   I have personally reviewed the following labs and imaging studies CBC    Component Value Date/Time   WBC 13.8 (H) 09/20/2023 0618   RBC 3.22 (L) 09/20/2023 0618   HGB 8.8 (L) 09/20/2023 0618   HGB 9.3 (L) 05/30/2023 1025   HGB 8.2 (L) 04/25/2023 0709   HCT 28.2 (L) 09/20/2023 0618   HCT  24.7 (L) 04/25/2023 0709   PLT 200 09/20/2023 0618   PLT 174 05/30/2023 1025   PLT 128 (L) 04/25/2023 0709   MCV 87.6 09/20/2023 0618   MCV 96 04/25/2023 0709   MCH 27.3 09/20/2023 0618   MCHC 31.2 09/20/2023 0618   RDW 15.8 (H) 09/20/2023 0618   RDW 13.9 04/25/2023 0709   LYMPHSABS 1.3 08/22/2023 1533   LYMPHSABS 1.7 04/25/2023 0709   MONOABS 0.3 08/22/2023 1533   EOSABS 0.1 08/22/2023 1533   EOSABS 0.1 04/25/2023 0709   BASOSABS 0.0 08/22/2023 1533   BASOSABS 0.0 04/25/2023 0709      Latest Ref Rng & Units 09/20/2023    6:18 AM 09/19/2023    8:42 AM 09/18/2023    4:38 AM  BMP  Glucose 70 - 99 mg/dL 820  808  810   BUN 8 - 23 mg/dL 53  56  58   Creatinine 0.61 - 1.24 mg/dL 6.10  5.50  4.53   Sodium 135 - 145 mmol/L 140  136  134   Potassium 3.5 - 5.1 mmol/L 4.8  4.8  4.7   Chloride 98 - 111 mmol/L 107  105  102   CO2 22 - 32 mmol/L 23  22  23    Calcium  8.9 - 10.3 mg/dL 8.9  8.7  8.6     DG OR UROLOGY CYSTO IMAGE ([facility] ONLY) Result Date:  09/19/2023 There is no interpretation for this exam.  This order is for images obtained during a surgical procedure.  Please See Surgeries Tab for more information regarding the procedure.   US  RENAL Result Date: 09/18/2023 CLINICAL DATA:  Gross hematuria. EXAM: RENAL / URINARY TRACT ULTRASOUND COMPLETE COMPARISON:  None Available. FINDINGS: Right Kidney: Renal measurements: 9.1 cm x 5.2 cm x 6.0 cm = volume: 148 mL. Diffusely increased echogenicity of the renal parenchyma is seen. No mass is visualized. There is moderate severity right-sided hydronephrosis. Left Kidney: Renal measurements: 9.5 cm x 5.4 cm x 4.1 cm = volume: 111.6 mL. Diffusely increased echogenicity of the renal parenchyma is seen. 0.9 cm x 0.6 cm x 1.1 cm and 1.8 cm x 1.4 cm x 1.6 cm left renal cysts are noted. There is mild left-sided hydronephrosis. Bladder: A Foley catheter is in place. Large amount of air is seen within the bladder lumen. Other: None. IMPRESSION: 1.  Bilateral echogenic kidneys which may represent sequelae associated with medical renal disease. 2. Small left renal cyst. 3. Bilateral hydronephrosis, right greater than left. 4. Large amount of air within the bladder lumen. Electronically Signed   By: Suzen Dials M.D.   On: 09/18/2023 20:55    Disposition Plan & Communication  Patient status: Inpatient  Admitted From: Home Planned disposition location: Home Anticipated discharge date: 8/16 pending hgb stability, urology workup  Family Communication: none at bedside    Author: Marien LITTIE Piety, DO [facility] 09/20/2023, 8:00 AM   Available by Epic secure chat 7AM-7PM. If 7PM-7AM, please contact night-coverage.  [facility] contact information found on [email or web address].

## 2023-09-20 NOTE — TOC Progression Note (Signed)
 Transition of Care [facility]) - Progression Note    Patient Details  Name: Jay Flores MRN: [id number] Date of Birth: 16-Mar-1960  [facility] CM/SW Contact  Dalia GORMAN Fuse, RN Phone Number: 09/20/2023, 3:30 PM  Clinical Narrative:     Urine is gradually clearing. Received 2 units of PRBCs for HGB under 8. HGB is 8.8 today. Plan to return to [facility] when he is medically ready for discharge.    Expected Discharge Plan: Group Home Barriers to Discharge: Continued Medical Work up               Expected Discharge Plan and Services   Discharge Planning Services: CM Consult   Living arrangements for the past 2 months: Group Home                                       Social Drivers of Health (SDOH) Interventions SDOH Screenings   Food Insecurity: No Food Insecurity (09/17/2023)  Housing: Low Risk  (09/17/2023)  Transportation Needs: No Transportation Needs (09/17/2023)  Utilities: Not At Risk (09/17/2023)  Depression (PHQ2-9): Low Risk  (03/23/2022)  Tobacco Use: High Risk (09/16/2023)    Readmission Risk Interventions    07/31/2023    9:41 AM  Readmission Risk Prevention Plan  Transportation Screening Complete  Medication Review (RN Care Manager) Complete  PCP or Specialist appointment within 3-5 days of discharge Complete  SW Recovery Care/Counseling Consult Complete  Palliative Care Screening Not Applicable  Skilled Nursing Facility Not Applicable

## 2023-09-20 NOTE — Plan of Care (Signed)
  Problem: Education: Goal: Ability to describe self-care measures that may prevent or decrease complications (Diabetes Survival Skills Education) will improve Outcome: Progressing Goal: Individualized Educational Video(s) Outcome: Progressing   Problem: Coping: Goal: Ability to adjust to condition or change in health will improve Outcome: Progressing   Problem: Health Behavior/Discharge Planning: Goal: Ability to identify and utilize available resources and services will improve Outcome: Progressing Goal: Ability to manage health-related needs will improve Outcome: Progressing   Problem: Metabolic: Goal: Ability to maintain appropriate glucose levels will improve Outcome: Progressing   Problem: Skin Integrity: Goal: Risk for impaired skin integrity will decrease Outcome: Progressing   Problem: Nutritional: Goal: Maintenance of adequate nutrition will improve Outcome: Progressing Goal: Progress toward achieving an optimal weight will improve Outcome: Progressing   Problem: Tissue Perfusion: Goal: Adequacy of tissue perfusion will improve Outcome: Progressing   Problem: Skin Integrity: Goal: Risk for impaired skin integrity will decrease Outcome: Progressing   Problem: Education: Goal: Knowledge of General Education information will improve Description: Including pain rating scale, medication(s)/side effects and non-pharmacologic comfort measures Outcome: Progressing   Problem: Clinical Measurements: Goal: Ability to maintain clinical measurements within normal limits will improve Outcome: Progressing Goal: Will remain free from infection Outcome: Progressing Goal: Diagnostic test results will improve Outcome: Progressing Goal: Respiratory complications will improve Outcome: Progressing Goal: Cardiovascular complication will be avoided Outcome: Progressing   Problem: Nutrition: Goal: Adequate nutrition will be maintained Outcome: Progressing   Problem:  Activity: Goal: Risk for activity intolerance will decrease Outcome: Progressing   Problem: Coping: Goal: Level of anxiety will decrease Outcome: Progressing   Problem: Elimination: Goal: Will not experience complications related to bowel motility Outcome: Progressing Goal: Will not experience complications related to urinary retention Outcome: Progressing   Problem: Safety: Goal: Ability to remain free from injury will improve Outcome: Progressing   Problem: Pain Managment: Goal: General experience of comfort will improve and/or be controlled Outcome: Progressing   Problem: Skin Integrity: Goal: Risk for impaired skin integrity will decrease Outcome: Progressing

## 2023-09-20 NOTE — Consult Note (Signed)
 Consultation Note Date: 09/20/2023   Patient Name: Jay Flores  DOB: 12-12-1960  MRN: [id number]  Age / Sex: 63 y.o., male  PCP: Jay Alm SQUIBB, MD Referring Physician: Lenon Marien CROME, MD  Reason for Consultation: Establishing goals of care   HPI/Brief Hospital Course: 63 y.o. male  with past medical history of HTN, HLD, T2DM, COPD, CKD stage 3B, HIV with demenita, HCV, thrombocytopenia, recent history of prostate cancer-completed radiation treatment (07/2023), chronic indewelling Foley catheter, DVT/PE on Eliquis  and iron  deficiency anemia admitted from Group Home on 09/16/2023 with lower abdominal pain and hematuria. Received outpatient iron  transfusion on 8/11, developed chest pain and lower abdominal pain prior to leaving and was transferred to ED.  CT imaging revealed large distended bladder with superior bladder wall diverticulum, bilateral hydronephrosis, leukocytosis and worsening renal function  Urology consulted, CBI--s/p cystoscopy with clot evacuation 8/14 biopsy results pending due to suspicion for malignancy  Being treated for UTI with positive urine culture, significant AKI on CKD (improving), ABLA due to hematuria s/p multiple blood transfusions  Noted 4IP admits within last 6 months  Palliative medicine was consulted for assisting with goals of care conversations.  Subjective:  Extensive chart review has been completed prior to meeting patient including labs, vital signs, imaging, progress notes, orders, and available advanced directive documents from current and previous encounters.  Visited with Jay Flores at his bedside. He is awake, alert, able to engage in conversation. Lunch tray remains at bedside, untouched, he shares he lacks an appetite. He is only oriented to self and place, aware he is in hospital but unsure of location, unable to correctly identify time or situation. No family at bedside during time of  visit.  Reviewed documents available in [place], Advanced Directive available appointing Jay Flores as HCPOA, Ms. Jay Flores not listed in emergency contact.  Called and spoke with sister-Jay Flores, she has been getting updates from the nursing staff regarding Jay Flores's condition. Inquired about HCPOA-Jay shares, Jay is the owner of the group home Jay Flores resides in. Jay was unaware of completion of HCPOA and mentions Jay Flores likely not having knowledge of the document he was providing consent for. It is also documented from medical providers around the time document was completed that Jay Flores lacked ability to sign his own consent due to disorientation, known history of HIV dementia for several years.  Concern related to validity of HCPOA as well as possible financial gain/exploitation passed along to [facility] and recommended APS involvement.  Jay shares she is not able to visit with Jay Flores often as she does not drive. She shares their sister, Jay is able to visit with Jay Flores more often. Attempted to call Jay at number provided-male voice answered claiming to not be Jay Flores. Received call back from male stating she was Jay Flores, attempted to call number, unsuccessfully.  Establishment of HCPOA/surrogate decision maker needed. PMT will continue to follow closely and offer ongoing needs and support.  Objective: Primary Diagnoses: Present on Admission:  Acute kidney injury superimposed on stage 3b chronic kidney disease (HCC)  Bilateral hydronephrosis  Acute urinary retention  HIV (human immunodeficiency virus infection) (HCC)  Anemia in chronic kidney disease (CKD)  Tobacco use  HTN (hypertension)  HLD (hyperlipidemia)  Type II diabetes mellitus with renal manifestations (HCC)  Elevated lactic acid level  Protein-calorie malnutrition, severe  CAD (coronary artery disease)  DVT (deep venous thrombosis)_left leg  Pulmonary embolism (HCC)  Bladder wall  thickening   Physical Exam  Constitutional:      General: He is not in acute distress.    Appearance: He is cachectic.     Comments: Temporal wasting  Pulmonary:     Effort: Pulmonary effort is normal. No respiratory distress.  Abdominal:     General: There is no distension.     Tenderness: There is no abdominal tenderness.  Skin:    General: Skin is warm and dry.  Neurological:     Mental Status: He is alert. He is disoriented.     Motor: Weakness present.     Vital Signs: BP (!) 154/82 (BP Location: Left Arm)   Pulse 80   Temp 97.7 F (36.5 C)   Resp 16   Ht 5' 8 (1.727 m)   Wt 47.6 kg   SpO2 100%   BMI 15.96 kg/m  Pain Scale: 0-10   Pain Score: 0-No pain  IO: Intake/output summary:  Intake/Output Summary (Last 24 hours) at 09/20/2023 1658 Last data filed at 09/20/2023 1300 Gross per 24 hour  Intake 0 ml  Output 1650 ml  Net -1650 ml    LBM: Last BM Date : 09/19/23 Baseline Weight: Weight: 47.6 kg Most recent weight: Weight: 47.6 kg      Assessment and Plan  SUMMARY OF RECOMMENDATIONS   Clarification needed on HCPOA/surrogate decision maker  Palliative Prophylaxis:   Bowel Regimen, Delirium Protocol and Frequent Pain Assessment  Discussed With: Primary team and TOC   Thank you for this consult and allowing Palliative Medicine to participate in the care of Jay Flores. Palliative medicine will continue to follow and assist as needed.   Time Total: 75 minutes  Time spent includes: Detailed review of medical records (labs, imaging, vital signs), medically appropriate exam (mental status, respiratory, cardiac, skin), discussed with treatment team, counseling and educating patient, family and staff, documenting clinical information, medication management and coordination of care.   Signed by: Jay Lesches, DNP, AGNP-C Palliative Medicine    Please contact Palliative Medicine Team phone at [phone] for questions and concerns.  For  individual provider: See Jay Flores

## 2023-09-21 DIAGNOSIS — Z515 Encounter for palliative care: Secondary | ICD-10-CM | POA: Diagnosis not present

## 2023-09-21 DIAGNOSIS — N1832 Chronic kidney disease, stage 3b: Secondary | ICD-10-CM | POA: Diagnosis not present

## 2023-09-21 DIAGNOSIS — N3289 Other specified disorders of bladder: Secondary | ICD-10-CM | POA: Diagnosis not present

## 2023-09-21 DIAGNOSIS — N179 Acute kidney failure, unspecified: Secondary | ICD-10-CM | POA: Diagnosis not present

## 2023-09-21 DIAGNOSIS — N1330 Unspecified hydronephrosis: Secondary | ICD-10-CM | POA: Diagnosis not present

## 2023-09-21 DIAGNOSIS — N139 Obstructive and reflux uropathy, unspecified: Secondary | ICD-10-CM | POA: Diagnosis not present

## 2023-09-21 LAB — CBC
HCT: 26.1 % — ABNORMAL LOW (ref 39.0–52.0)
Hemoglobin: 8.2 g/dL — ABNORMAL LOW (ref 13.0–17.0)
MCH: 27.2 pg (ref 26.0–34.0)
MCHC: 31.4 g/dL (ref 30.0–36.0)
MCV: 86.7 fL (ref 80.0–100.0)
Platelets: 178 K/uL (ref 150–400)
RBC: 3.01 MIL/uL — ABNORMAL LOW (ref 4.22–5.81)
RDW: 15.7 % — ABNORMAL HIGH (ref 11.5–15.5)
WBC: 11.3 K/uL — ABNORMAL HIGH (ref 4.0–10.5)
nRBC: 0 % (ref 0.0–0.2)

## 2023-09-21 LAB — BASIC METABOLIC PANEL WITH GFR
Anion gap: 7 (ref 5–15)
BUN: 54 mg/dL — ABNORMAL HIGH (ref 8–23)
CO2: 24 mmol/L (ref 22–32)
Calcium: 8.9 mg/dL (ref 8.9–10.3)
Chloride: 105 mmol/L (ref 98–111)
Creatinine, Ser: 3.41 mg/dL — ABNORMAL HIGH (ref 0.61–1.24)
GFR, Estimated: 20 mL/min — ABNORMAL LOW (ref >60)
Glucose, Bld: 157 mg/dL — ABNORMAL HIGH (ref 70–99)
Potassium: 4.1 mmol/L (ref 3.5–5.1)
Sodium: 136 mmol/L (ref 135–145)

## 2023-09-21 LAB — CULTURE, BLOOD (ROUTINE X 2)
Culture: NO GROWTH
Culture: NO GROWTH

## 2023-09-21 LAB — GLUCOSE, CAPILLARY
Glucose-Capillary: 131 mg/dL — ABNORMAL HIGH (ref 70–99)
Glucose-Capillary: 257 mg/dL — ABNORMAL HIGH (ref 70–99)
Glucose-Capillary: 115 mg/dL — ABNORMAL HIGH (ref 70–99)
Glucose-Capillary: 168 mg/dL — ABNORMAL HIGH (ref 70–99)

## 2023-09-21 MED ORDER — IRON SUCROSE 200 MG IVPB - SIMPLE MED
200.0000 mg | Freq: Once | Status: AC
  Administered 2023-09-21: 200 mg via INTRAVENOUS
  Filled 2023-09-21: qty 200

## 2023-09-21 NOTE — Evaluation (Signed)
 Occupational Therapy Evaluation Patient Details Name: REMBERTO Flores MRN: [id number] DOB: 18-Aug-1960 Today's Date: 09/21/2023   History of Present Illness   Pt is a 63 y.o. male, group home resident,  with medical history significant of hypertension, hyperlipidemia, diabetes mellitus, COPD, CKD-3B, HIV (CD 258 on 03/29/17 and VL 70 on 04/25/23), HIV dementia HCV (s/p of Harvoni), thrombocytopenia, prostate cancer, indwelling Foley catheter placement, iron  deficiency anemia, DVT/PE on Eliquis , who presents to ED  08/11 with lower abdominal pain and hematuria. S/p cystoscopy with trans urethral resection of bladder tumor on 8/15.     Clinical Impressions Jay Flores was seen for OT/PT co-evaluation this date. Prior to hospital admission, pt was residing at a group home. Pt with baseline history of cognitive impairment, provides conflicting PLOF at times. Per pt, he was able to perform ADL management at [facility] level independently. States he uses a RW to pivot to his WC. Pt presents with deficits in strength, cognition, activity tolerance, and balance affecting safe and optimal ADL completion. Pt currently requires MAX A for LB ADL management with seated lateral leans, MOD A for squat pivot transfers to/from [facility].  Pt would benefit from skilled OT services to address noted impairments and functional limitations (see below for any additional details) in order to maximize safety and independence while minimizing falls risk and caregiver burden. Anticipate the need for follow up OT services upon acute hospital DC.   Of note, pt personal WC appears poorly fitted, is missing arm rests, and has exposed screws. Pt would benefit from properly fitted [facility] for improved safety and functional independence upon DC. Patient suffers from AKI, malnutrition, HIV, and Dementia which impairs his ability to perform daily activities like toileting, feeding, dressing, grooming, bathing in the home. A cane, walker, crutch will not  resolve the patient's issue with performing activities of daily living. A lightweight wheelchair and cushion is required/recommended and will allow patient to safely perform daily activities.   Patient can safely propel the wheelchair in the home or has a caregiver who can provide assistance.        If plan is discharge home, recommend the following:   A lot of help with bathing/dressing/bathroom;A lot of help with walking and/or transfers;Direct supervision/assist for financial management;Assist for transportation;Assistance with cooking/housework;Help with stairs or ramp for entrance;Supervision due to cognitive status;Direct supervision/assist for medications management     Functional Status Assessment   Patient has had a recent decline in their functional status and demonstrates the ability to make significant improvements in function in a reasonable and predictable amount of time.     Equipment Recommendations   BSC/3in1     Recommendations for Other Services         Precautions/Restrictions   Precautions Precautions: Fall Recall of Precautions/Restrictions: Impaired Restrictions Weight Bearing Restrictions Per Provider Order: No     Mobility Bed Mobility Overal bed mobility: Needs Assistance Bed Mobility: Supine to Sit, Sit to Supine     Supine to sit: Mod assist, HOB elevated Sit to supine: Mod assist, Used rails   General bed mobility comments: modA +1 for truncal support to sit EOB, HOB slightly elevated, use of BUE for LE facilitation to EOB. MinA +1 for BLE facilitation onto bed to lie supine. Dep +2 for supine scooting towards HOB via draw sheet.    Transfers Overall transfer level: Needs assistance   Transfers: Bed to chair/wheelchair/BSC     Squat pivot transfers: Mod assist       General transfer  comment: modA +1 for SqPT from EOB<>mWC with UUE support on bed/WC arm rest for support. Multimodal ceus for safety, sequencing, and hand  placement.      Balance Overall balance assessment: Needs assistance Sitting-balance support: Feet supported, Bilateral upper extremity supported, Single extremity supported Sitting balance-Leahy Scale: Fair Sitting balance - Comments: fluctuating between fair and poor due to intermittent posterior LOB with dynamic balance, requiring assist for correction   Standing balance support: Reliant on assistive device for balance, During functional activity, Bilateral upper extremity supported Standing balance-Leahy Scale: Poor Standing balance comment: modA for SqPT EOB<>mWC, uses raling on bed/WC for stability with tf                           ADL either performed or assessed with clinical judgement   ADL Overall ADL's : Needs assistance/impaired                                       General ADL Comments: MOD A for bed mobility/functional transfers to/from [facility]. MAX A to don brief with seated lateral leans at EOB. Pt reports baseline bowel incontinence. Has indwelling foely for urinary management. SET UP assist for oral care and face washing at [facility] level.     Vision Patient Visual Report: No change from baseline       Perception         Praxis         Pertinent Vitals/Pain Pain Assessment Pain Assessment: No/denies pain     Extremity/Trunk Assessment Upper Extremity Assessment Upper Extremity Assessment: Generalized weakness   Lower Extremity Assessment Lower Extremity Assessment: Generalized weakness       Communication Communication Communication: No apparent difficulties   Cognition Arousal: Lethargic Behavior During Therapy: Flat affect Cognition: No family/caregiver present to determine baseline, History of cognitive impairments, Cognition impaired   Orientation impairments: Place, Time, Situation Awareness: Intellectual awareness intact, Online awareness impaired   Attention impairment (select first level of impairment): Sustained  attention, Selective attention, Alternating attention, Divided attention Executive functioning impairment (select all impairments): Sequencing, Problem solving, Initiation, Organization, Reasoning OT - Cognition Comments: Requires consistent multimodal cueing t/o tasks, particularly for safety (e.g. to lock WC breaks prior to attempting transfer).                 Following commands: Impaired Following commands impaired: Follows one step commands with increased time     Cueing  General Comments   Cueing Techniques: Verbal cues;Tactile cues;Visual cues  skin integrity grossly intact   Exercises Other Exercises Other Exercises: Pt educated on role of OT in acute setting, safety, falls prevention strategies, and DC recs.   Shoulder Instructions      Home Living Family/patient expects to be discharged to:: Group home   Available Help at Discharge:  (Pt unsure if group home staff would be able to assist.) Type of Home: House       Home Layout: One level     Bathroom Shower/Tub: Producer, television/film/video: Standard     Home Equipment: Shower seat;Wheelchair - manual   Additional Comments: pt chart review ramp to enter home, CSW stated pt has RW and WC      Prior Functioning/Environment Prior Level of Function : Patient poor historian/Family not available             Mobility Comments: modI  for mobility to/from RW, questionable with RW; mod I for bed mobility and pivot tranfers to/from [facility]. Mod I for self propulsion. ADLs Comments: Questionable historian of health due to baseline dementia. Initially states he needs assist with ADL's, later states he's IND. Sits in [facility] for self care tasks    OT Problem List: Decreased strength;Decreased coordination;Decreased activity tolerance;Decreased safety awareness;Impaired balance (sitting and/or standing);Decreased knowledge of use of DME or AE;Decreased cognition;Decreased range of motion   OT  Treatment/Interventions: Self-care/ADL training;Therapeutic exercise;Therapeutic activities;DME and/or AE instruction;Patient/family education;Balance training;Energy conservation;Cognitive remediation/compensation;Neuromuscular education      OT Goals(Current goals can be found in the care plan section)   Acute Rehab OT Goals Patient Stated Goal: To feel better OT Goal Formulation: With patient Time For Goal Achievement: 10/05/23 Potential to Achieve Goals: Good ADL Goals Pt Will Perform Grooming: sitting;with modified independence Pt Will Perform Lower Body Dressing: sitting/lateral leans;with min assist Pt Will Transfer to Toilet: bedside commode;with transfer board;squat pivot transfer;with supervision;with set-up (drop arm commode) Pt Will Perform Toileting - Clothing Manipulation and hygiene: with adaptive equipment;sitting/lateral leans;with supervision;with set-up   OT Frequency:  Min 2X/week    Co-evaluation PT/OT/SLP Co-Evaluation/Treatment: Yes Reason for Co-Treatment: Complexity of the patient's impairments (multi-system involvement);For patient/therapist safety;To address functional/ADL transfers PT goals addressed during session: Mobility/safety with mobility;Balance;Proper use of DME OT goals addressed during session: ADL's and self-care      AM-PAC OT 6 Clicks Daily Activity     Outcome Measure Help from another person eating meals?: None Help from another person taking care of personal grooming?: A Little Help from another person toileting, which includes using toliet, bedpan, or urinal?: A Lot Help from another person bathing (including washing, rinsing, drying)?: A Lot Help from another person to put on and taking off regular upper body clothing?: A Little Help from another person to put on and taking off regular lower body clothing?: A Lot 6 Click Score: 16   End of Session Equipment Utilized During Treatment: Gait belt;Rolling walker (2 wheels)  Activity  Tolerance: Patient tolerated treatment well Patient left: in bed;with call bell/phone within reach;with bed alarm set  OT Visit Diagnosis: Other abnormalities of gait and mobility (R26.89);Muscle weakness (generalized) (M62.81)                Time: 9093-9070 OT Time Calculation (min): 23 min Charges:  OT General Charges $OT Visit: 1 Visit OT Evaluation $OT Eval Moderate Complexity: 1 Mod OT Treatments $Self Care/Home Management : 8-22 mins  Jhonny Pelton, M.S., OTR/L 09/21/23, 11:58 AM

## 2023-09-21 NOTE — Progress Notes (Signed)
 Daily Progress Note   Patient Name: Jay Flores       Date: 09/21/2023 DOB: Aug 08, 1960  Age: 63 y.o. MRN#: [id number] Attending Physician: Lenon Marien CROME, MD Primary Care Physician: [name], MD Admit Date: 09/16/2023  Reason for Consultation/Follow-up: Establishing goals of care  HPI/Brief Hospital Review: 63 y.o. male  with past medical history of HTN, HLD, T2DM, COPD, CKD stage 3B, HIV with demenita, HCV, thrombocytopenia, recent history of prostate cancer-completed radiation treatment (07/2023), chronic indewelling Foley catheter, DVT/PE on Eliquis  and iron  deficiency anemia admitted from Group Home on 09/16/2023 with lower abdominal pain and hematuria. Received outpatient iron  transfusion on 8/11, developed chest pain and lower abdominal pain prior to leaving and was transferred to ED.   CT imaging revealed large distended bladder with superior bladder wall diverticulum, bilateral hydronephrosis, leukocytosis and worsening renal function   Urology consulted, CBI--s/p cystoscopy with clot evacuation 8/14 biopsy results pending due to suspicion for malignancy   Being treated for UTI with positive urine culture, significant AKI on CKD (improving), ABLA due to hematuria s/p multiple blood transfusions   Noted 4IP admits within last 6 months   Palliative medicine was consulted for assisting with goals of care conversations.  Subjective: Extensive chart review has been completed prior to meeting patient including labs, vital signs, imaging, progress notes, orders, and available advanced directive documents from current and previous encounters.    Visited with Jay Flores at his bedside. He is awake, alert and able to engage in conversation. Remains oriented to self.  Breakfast tray at bedside, he was able to consume about 75% of tray. No family or visitors at bedside.  Called and spoke with sister-Faye, she confirms Jay Flores resides in a family care home run by [employer]. Nichole shares she used to work at the facility Jay Flores resides in. She tries to speak with Jay Flores on the telephone once a week or a few times per month.  Called and spoke with Lennette Flores, she confirms Jay Flores has been a resident at her care home for many years. She shares she was appointed [employer] but also involves his sisters in decision making.  Reviewed Jay Flores's hospitalization, s/p cystoscopy, renal function improving, awaiting biopsy results--oncology consulted.  Ms. Flores shares at baseline, Jay Flores is primarily wheelchair bound but  attempts to ambulate short distances. She shares he has not had a recent change in appetite or aware of recent weight loss. She shares Jay Flores continues to smoke cigarettes daily.  We discussed concerns related to Jay Flores protein calorie malnutrition, albumin  of 2.6 in July, multiple hospital admissions within last 6 months and overall frail state.  We discussed goals of care related to code status and end of life expectations/wishes Jay Flores has for himself. Ms. Flores shares she has not engaged in these types of conversations with Jay Flores of his desires related to his health care.  Discussions had related to code status, difference between Full Code and Do Not Resuscitate. Encouraged Ms. Flores to consider DNR/DNI status understanding evidenced based poor outcomes in similar hospitalized patients, as the cause of the arrest is likely associated with chronic/terminal disease rather than a reversible acute cardio-pulmonary event.    We also discussed overall poor prognosis-encouraged consideration to be had regarding quality versus quantity of life. Concern expressed for Ms. Flores's ability to tolerate further  oncological treatment/interventions.  Ms. Flores appreciative of conversation, she shares she will be in communication with Jay Flores's sisters and discuss code status and goals of care. Provided Ms. Flores with PMT contact information. Added Jay Flores contact information to Jay Flores's chart.  Answered and addressed all questions and concerns. PMT to continue to follow for ongoing needs and support.  Objective:  Physical Exam Constitutional:      General: He is not in acute distress.    Appearance: He is cachectic.     Comments: Frail, temporal wasting  Pulmonary:     Effort: Pulmonary effort is normal. No respiratory distress.  Skin:    General: Skin is warm and dry.  Neurological:     Mental Status: He is alert.     Motor: Weakness present.             Vital Signs: BP 121/81 (BP Location: Left Arm)   Pulse 78   Temp (!) 97.5 F (36.4 C)   Resp 17   Ht 5' 8 (1.727 m)   Wt 47.6 kg   SpO2 100%   BMI 15.96 kg/m  SpO2: SpO2: 100 % O2 Device: O2 Device: Room Air O2 Flow Rate: O2 Flow Rate (L/min): 6 L/min   Palliative Care Assessment & Plan   Assessment/Recommendation/Plan  Ongoing GOC needed-HCPOA and sisters involvement  Thank you for allowing the Palliative Medicine Team to assist in the care of this patient.  Total time:  35 minutes  Time spent includes: Detailed review of medical records (labs, imaging, vital signs), medically appropriate exam (mental status, respiratory, cardiac, skin), discussed with treatment team, counseling and educating patient, family and staff, documenting clinical information, medication management and coordination of care.  Waddell Lesches, DNP, AGNP-C Palliative Medicine   Please contact Palliative Medicine Team phone at [phone] for questions and concerns.

## 2023-09-21 NOTE — Progress Notes (Signed)
 PROGRESS NOTE Jay Flores    DOB: Nov 01, 1960, 63 y.o.  FMW:[id number]    Code Status: Full Code   DOA: 09/16/2023   LOS: 5  Brief hospital course  Jay Flores is a 63 y.o. male, group home resident,  with medical history significant of hypertension, hyperlipidemia, diabetes mellitus, COPD, CKD-3B, HIV (CD 258 on 03/29/17 and VL 70 on 04/25/23), HIV dementia HCV (s/p of Harvoni), thrombocytopenia, prostate cancer, indwelling Foley catheter placement, iron  deficiency anemia, DVT/PE on Eliquis , who presents to ED  08/11 with lower abdominal pain and hematuria. 08/11: to ED, CT (+)large distended bladder with superior bladder wall diverticulum, progressive or worsening bilateral hydroureteronephrosis. WBC 22.8, worsening renal function, troponin 23 --> 24, lactic acid of 5.4 --> 5.3. Urology consult - 33 French catheter was removed and exchanged for a 22 [place] three-way Foley catheter, irrigated with about a liter and a half of saline and evacuated significant amount of clot, placed on CBI with symptoms improved.   09/21/23 -urine has gradually been clearing. Abdominal pain is resolved. Received 2nd u pRBC yesterday and hgb stable 8/14. Patient tolerated cystoscopy well. Biopsy results pending. Oncology consulted to follow for results.   Assessment & Plan  Principal Problem:   Acute kidney injury superimposed on stage 3b chronic kidney disease (HCC) Active Problems:   Bilateral hydronephrosis   Urinary retention   Bladder wall thickening   DVT (deep venous thrombosis)_left leg   Pulmonary embolism (HCC)   CAD (coronary artery disease)   HTN (hypertension)   Type II diabetes mellitus with renal manifestations (HCC)   HLD (hyperlipidemia)   HIV (human immunodeficiency virus infection) (HCC)   Elevated lactic acid level   Anemia in chronic kidney disease (CKD)   Tobacco use   Protein-calorie malnutrition, severe   AKI (acute kidney injury) (HCC)   Hematuria  AKI superimposed on  stage 3b CKD with bilateral hydronephrosis due to acute urinary retention. Renal US  positive for: 1. Bilateral echogenic kidneys which may represent sequelae associated with medical renal disease. 2. Small left renal cyst. 3. Bilateral hydronephrosis, right greater than left. Baseline creatinine 2.62 on 08/16/2023.  Since admission has trended 7.03>>> 3.41 s/p foley replacement.  - urology following.  - continue foley  - cystoscopy with clot evacuation 8/14. Biopsy taken with suspicion for malignancy on inspection.  - oncology consulted- will follow up outpatient - continue IV Abx, UxCx positive for klebsiella pneumoniae and e faecalis. Cefazolin  changed to unasyn     Hematuria- CT shows large clot within the bladder causing obstruction, diffuse bladder wall thickening, suspicious for underlying neoplasm Hold Eliquis , consider restart when hgb stable and no further procedures planned by urology  Continuous bladder irrigation Urology following Continue foley   ABLA d/t hematuria see above Anemia chronic disease- hgb goal 8 or greater.  Treat underlying cause as above  Hold eliquis   Transfused 2nd u pRBCs 8/13, hgb remains stable at 8.8>8.2 today Ordering venofer  per hematology recs   Prostate cancer status post external beam radiation, completed in June 2025.  Chronic urinary retention managed with a Foley catheter. first noted to be in urinary retention in August 2022. He has his catheter exchanged every 4 weeks in the urology clinic.  Continue Proscar  and Flomax     DVT left leg and pulmonary embolism-  not acutely exacerbated Temporarily hold Eliquis  SCDs   CAD  HLD Troponin 23 --> 26, possibly due to demand ischemia. Continue Lipitor   HTN Hold Cozaar  due to worsening renal function. BP  has maintained WNL   Type II diabetes mellitus with renal manifestations [facility]): Recent A1c 6.3, well-controlled.   Patient is taking Jardiance and metformin SSI   HIV (human immunodeficiency  virus infection) (HCC) Continue Juluca     Tobacco use Nicotine  patch  Body mass index is 15.96 kg/m.  VTE ppx: Place and maintain sequential compression device Start: 09/17/23 1256 SCDs Start: 09/16/23 2126  Diet:     Diet   Diet regular Room service appropriate? Yes; Fluid consistency: Thin   Consultants: Urology   Subjective 09/21/23    Pt reports feeling well. Denies dysuria, pelvic pain. No concerns today.    Objective  Blood pressure 136/83, pulse 73, temperature (!) 97.3 F (36.3 C), resp. rate 18, height 5' 8 (1.727 m), weight 47.6 kg, SpO2 100%.  Intake/Output Summary (Last 24 hours) at 09/21/2023 0752 Last data filed at 09/21/2023 0600 Gross per 24 hour  Intake 440 ml  Output 2400 ml  Net -1960 ml   Filed Weights   09/16/23 1345 09/19/23 1312  Weight: 47.6 kg 47.6 kg     Physical Exam:  General: awake, alert, NAD, frail Respiratory: normal respiratory effort. Gastrointestinal: soft, NT, ND Nervous: A&O x3. no gross focal neurologic deficits, normal speech Extremities: moves all equally, no edema, normal tone Skin: dry, intact, normal temperature, normal color. No rashes, lesions or ulcers on exposed skin Psychiatry: normal mood, congruent affect  Labs   I have personally reviewed the following labs and imaging studies CBC    Component Value Date/Time   WBC 11.3 (H) 09/21/2023 0546   RBC 3.01 (L) 09/21/2023 0546   HGB 8.2 (L) 09/21/2023 0546   HGB 9.3 (L) 05/30/2023 1025   HGB 8.2 (L) 04/25/2023 0709   HCT 26.1 (L) 09/21/2023 0546   HCT 24.7 (L) 04/25/2023 0709   PLT 178 09/21/2023 0546   PLT 174 05/30/2023 1025   PLT 128 (L) 04/25/2023 0709   MCV 86.7 09/21/2023 0546   MCV 96 04/25/2023 0709   MCH 27.2 09/21/2023 0546   MCHC 31.4 09/21/2023 0546   RDW 15.7 (H) 09/21/2023 0546   RDW 13.9 04/25/2023 0709   LYMPHSABS 1.3 08/22/2023 1533   LYMPHSABS 1.7 04/25/2023 0709   MONOABS 0.3 08/22/2023 1533   EOSABS 0.1 08/22/2023 1533   EOSABS 0.1  04/25/2023 0709   BASOSABS 0.0 08/22/2023 1533   BASOSABS 0.0 04/25/2023 0709      Latest Ref Rng & Units 09/21/2023    5:46 AM 09/20/2023    6:18 AM 09/19/2023    8:42 AM  BMP  Glucose 70 - 99 mg/dL 842  820  808   BUN 8 - 23 mg/dL 54  53  56   Creatinine 0.61 - 1.24 mg/dL 6.58  6.10  5.50   Sodium 135 - 145 mmol/L 136  140  136   Potassium 3.5 - 5.1 mmol/L 4.1  4.8  4.8   Chloride 98 - 111 mmol/L 105  107  105   CO2 22 - 32 mmol/L 24  23  22    Calcium  8.9 - 10.3 mg/dL 8.9  8.9  8.7     DG OR UROLOGY CYSTO IMAGE ([facility] ONLY) Result Date: 09/19/2023 There is no interpretation for this exam.  This order is for images obtained during a surgical procedure.  Please See Surgeries Tab for more information regarding the procedure.    Disposition Plan & Communication  Patient status: Inpatient  Admitted From: Home Planned disposition location: Home Anticipated discharge date:  8/18 pending hgb stability, urology workup  Family Communication: none at bedside    Author: Marien LITTIE Piety, DO [facility] 09/21/2023, 7:52 AM   Available by Epic secure chat 7AM-7PM. If 7PM-7AM, please contact night-coverage.  [facility] contact information found on [email or web address].

## 2023-09-21 NOTE — Plan of Care (Signed)
  Problem: Coping: Goal: Ability to adjust to condition or change in health will improve Outcome: Progressing   Problem: Fluid Volume: Goal: Ability to maintain a balanced intake and output will improve Outcome: Progressing   Problem: Skin Integrity: Goal: Risk for impaired skin integrity will decrease Outcome: Progressing   Problem: Education: Goal: Knowledge of General Education information will improve Description: Including pain rating scale, medication(s)/side effects and non-pharmacologic comfort measures Outcome: Progressing   Problem: Activity: Goal: Risk for activity intolerance will decrease Outcome: Progressing   Problem: Safety: Goal: Ability to remain free from injury will improve Outcome: Progressing

## 2023-09-21 NOTE — TOC Transition Note (Addendum)
 Transition of Care [facility]) - Discharge Note   Patient Details  Name: Jay Flores MRN: [id number] Date of Birth: 1961/01/17  Transition of Care [facility]) CM/SW Contact:  [place] L Markis Langland, LCSW Phone Number: 09/21/2023, 11:39 AM   Clinical Narrative:     CSW contacted [employer]. CSW spoke with Vernell. Vernell advised that there is no history of APS involvement with this patient of facility. [employer] does not have custody of this patient.   CSW triaged the information provided with Vernell. Vernell advised that there is no evidence of abuse/neglect. A report was not made as there was no clear defined evidence of abuse/neglect.   [facility] DSS has confirmed that there are no records of any complaints concerning this patient-meaning no official reports of abuse, neglect, or exploitation have been filed in their system.  In response, the following key questions were posed internally:  How would the patient be exploited if the group home is responsible for his care? Since the patient resides there and the group home holds HCPOA authority to facilitate necessary care, it's unclear where exploitation might arise.  Are there care decisions that the group home is not making? If essential services are being provided and no gaps are evident, it further supports the absence of neglect.  If decisions are being made effectively and the patient appears safe, what is the basis for concern regarding abuse or neglect-beyond procedural document questions? Without tangible indicators of harm, it's difficult to justify an APS report.  Conclusion: [employer] has determined that, based on current information, an APS report is not appropriate, as there's no evidence of abuse or neglect.     Barriers to Discharge: Continued Medical Work up   Patient Goals and CMS Choice            Discharge Placement                       Discharge Plan and Services Additional resources added to the After Visit Summary  for     Discharge Planning Services: CM Consult                                 Social Drivers of Health (SDOH) Interventions SDOH Screenings   Food Insecurity: No Food Insecurity (09/17/2023)  Housing: Low Risk  (09/17/2023)  Transportation Needs: No Transportation Needs (09/17/2023)  Utilities: Not At Risk (09/17/2023)  Depression (PHQ2-9): Low Risk  (03/23/2022)  Tobacco Use: High Risk (09/16/2023)     Readmission Risk Interventions    07/31/2023    9:41 AM  Readmission Risk Prevention Plan  Transportation Screening Complete  Medication Review (RN Care Manager) Complete  PCP or Specialist appointment within 3-5 days of discharge Complete  SW Recovery Care/Counseling Consult Complete  Palliative Care Screening Not Applicable  Skilled Nursing Facility Not Applicable

## 2023-09-21 NOTE — Plan of Care (Signed)
  Problem: Education: Goal: Ability to describe self-care measures that may prevent or decrease complications (Diabetes Survival Skills Education) will improve Outcome: Progressing   Problem: Coping: Goal: Ability to adjust to condition or change in health will improve Outcome: Progressing   Problem: Fluid Volume: Goal: Ability to maintain a balanced intake and output will improve Outcome: Progressing   Problem: Health Behavior/Discharge Planning: Goal: Ability to identify and utilize available resources and services will improve Outcome: Progressing   Problem: Metabolic: Goal: Ability to maintain appropriate glucose levels will improve Outcome: Progressing   Problem: Nutritional: Goal: Maintenance of adequate nutrition will improve Outcome: Progressing   Problem: Tissue Perfusion: Goal: Adequacy of tissue perfusion will improve Outcome: Progressing   Problem: Health Behavior/Discharge Planning: Goal: Ability to manage health-related needs will improve Outcome: Progressing   Problem: Clinical Measurements: Goal: Ability to maintain clinical measurements within normal limits will improve Outcome: Progressing   Problem: Activity: Goal: Risk for activity intolerance will decrease Outcome: Progressing   Problem: Nutrition: Goal: Adequate nutrition will be maintained Outcome: Progressing   Problem: Coping: Goal: Level of anxiety will decrease Outcome: Progressing   Problem: Elimination: Goal: Will not experience complications related to bowel motility Outcome: Progressing   Problem: Pain Managment: Goal: General experience of comfort will improve and/or be controlled Outcome: Progressing   Problem: Safety: Goal: Ability to remain free from injury will improve Outcome: Progressing   Problem: Skin Integrity: Goal: Risk for impaired skin integrity will decrease Outcome: Progressing

## 2023-09-21 NOTE — Evaluation (Addendum)
 Physical Therapy Evaluation Patient Details Name: Jay Flores MRN: [id number] DOB: 05-20-1960 Today's Date: 09/21/2023  History of Present Illness  Pt is a 63 y.o. male, group home resident,  with medical history significant of hypertension, hyperlipidemia, diabetes mellitus, COPD, CKD-3B, HIV (CD 258 on 03/29/17 and VL 70 on 04/25/23), HIV dementia HCV (s/p of Harvoni), thrombocytopenia, prostate cancer, indwelling Foley catheter placement, iron  deficiency anemia, DVT/PE on Eliquis , who presents to ED  08/11 with lower abdominal pain and hematuria. S/p cystoscopy with trans urethral resection of bladder tumor on 8/15.   Clinical Impression  Pt received in supine and is agreeable for PT eval. Pt questionable historian of health due to baseline dementia and conflicting statements during subjective questioning. At baseline, pt is mod I for IADL's, bed mobility, transfers to/from [facility], and self propulsion in [facility]. It is likely that group home assists with IADL's and medication management.   Pt presents with generalized weakness, decreased gross balance, decreased activity tolerance, and baseline dementia with impaired processing/problem solving, resulting in impaired functional mobility from baseline. Due to deficits, pt required min-modA for bed mobility and modA for transfers to/from [facility]. He requires increased cueing for sequencing, attention to task, and problem solving throughout session.   Deficits limit the pt's ability to safely and independently perform ADL's, transfer, and ambulate. Pt will benefit from acute skilled PT services to address deficits for return to baseline function. Pt will benefit from post acute therapy services to address deficits for return to baseline function.  Pt will benefit from new [facility] for proper positioning, improved biomechanics with self propulsion, reduced risk of overuse injury, and reduced risk of pressure sores. Current mWC seat is too short for pt resulting in  bilateral knee genu valgum which can result in pressure sores overtime given bony prominences. Seat width is a little to wide for the pt which alters biomechanics with self propulsion, increasing risk for overuse injuries, which can lead to increased debility as he mainly uses BUE for transfers given LE weakness. He will also benefit from a removable arm rest to promote safer transfers to/from [facility] and decreased caregiver burden.         If plan is discharge home, recommend the following: A lot of help with walking and/or transfers;A little help with bathing/dressing/bathroom;Direct supervision/assist for medications management;Assistance with cooking/housework;Assist for transportation;Help with stairs or ramp for entrance   Can travel by private vehicle   No    Equipment Recommendations Wheelchair (measurements PT) (defer to post acute)     Functional Status Assessment Patient has had a recent decline in their functional status and demonstrates the ability to make significant improvements in function in a reasonable and predictable amount of time.     Precautions / Restrictions Precautions Precautions: Fall Recall of Precautions/Restrictions: Impaired Restrictions Weight Bearing Restrictions Per Provider Order: No      Mobility  Bed Mobility               General bed mobility comments: modA +1 for truncal support to sit EOB, HOB slightly elevated, use of BUE for LE facilitation to EOB. MinA +1 for BLE facilitation onto bed to lie supine. Dep +2 for supine scooting towards HOB via draw sheet.    Transfers                   General transfer comment: modA +1 for SqPT from EOB<>mWC with UUE support on bed/WC arm rest for support. Multimodal ceus for safety, sequencing, and hand  placement.    Ambulation/Gait               General Gait Details: non-ambulatory at baseline      Balance Overall balance assessment: Needs assistance   Sitting balance-Leahy  Scale: Fair Sitting balance - Comments: fluctuating between fair and poor due to intermittent posterior LOB with dynamic balance, requiring assist for correction     Standing balance-Leahy Scale: Poor Standing balance comment: modA for SqPT EOB<>mWC                             Pertinent Vitals/Pain Pain Assessment Pain Assessment: No/denies pain    Home Living Family/patient expects to be discharged to:: Group home   Available Help at Discharge:  (Pt unsure if group home staff would be able to assist.) Type of Home: House         Home Layout: One level Home Equipment: Shower seat;Wheelchair - manual Additional Comments: pt chart review ramp to enter home, CSW stated pt has RW and WC    Prior Function Prior Level of Function : Patient poor historian/Family not available             Mobility Comments: modI for mobility to/from RW, questionable with RW; mod I for bed mobility and pivot tranfers to/from [facility]. Mod I for self propulsion. ADLs Comments: Questionable historian of health due to baseline dementia. Initially states he needs assist with ADL's, later states he's IND. Sits in [facility] for self care tasks     Extremity/Trunk Assessment   Upper Extremity Assessment Upper Extremity Assessment: Defer to OT evaluation    Lower Extremity Assessment Lower Extremity Assessment: Generalized weakness       Communication   Communication Communication: No apparent difficulties    Cognition Arousal: Lethargic Behavior During Therapy: Flat affect   PT - Cognitive impairments: History of cognitive impairments                       PT - Cognition Comments: baseline dementia; impaired memory and safety awareness. increased time for sequencing and processing of task, multimodal cues Following commands: Impaired       Cueing Cueing Techniques: Verbal cues, Tactile cues, Visual cues     General Comments General comments (skin integrity, edema,  etc.): skin integrity grossly intact    Exercises Other Exercises Other Exercises: Pt educated re: PT role/POC, DC recommendations, safety with functional mobility, OOB to recliner, problem solving with [facility] management.   Assessment/Plan    PT Assessment Patient needs continued PT services  PT Problem List Decreased strength;Decreased activity tolerance;Decreased balance;Decreased mobility;Decreased cognition;Decreased safety awareness       PT Treatment Interventions DME instruction;Functional mobility training;Therapeutic activities;Therapeutic exercise;Balance training;Neuromuscular re-education    PT Goals (Current goals can be found in the Care Plan section)  Acute Rehab PT Goals Patient Stated Goal: go home PT Goal Formulation: With patient Time For Goal Achievement: 10/05/23 Potential to Achieve Goals: Fair Additional Goals Additional Goal #1: Pt will be mod I to self propel in mWC 161ft, for independence with mobility at DC and for decreased caregiver burden    Frequency Min 2X/week     Co-evaluation PT/OT/SLP Co-Evaluation/Treatment: Yes Reason for Co-Treatment: Complexity of the patient's impairments (multi-system involvement);For patient/therapist safety;To address functional/ADL transfers PT goals addressed during session: Mobility/safety with mobility;Balance;Proper use of DME OT goals addressed during session: ADL's and self-care       AM-PAC PT 6  Clicks Mobility  Outcome Measure Help needed turning from your back to your side while in a flat bed without using bedrails?: A Little Help needed moving from lying on your back to sitting on the side of a flat bed without using bedrails?: A Lot Help needed moving to and from a bed to a chair (including a wheelchair)?: A Lot Help needed standing up from a chair using your arms (e.g., wheelchair or bedside chair)?: A Lot Help needed to walk in hospital room?: Total Help needed climbing 3-5 steps with a railing? :  Total 6 Click Score: 11    End of Session   Activity Tolerance: Patient tolerated treatment well;Patient limited by fatigue Patient left: in bed;with call bell/phone within reach;with bed alarm set Nurse Communication: Mobility status PT Visit Diagnosis: Unsteadiness on feet (R26.81);Muscle weakness (generalized) (M62.81)    Time: 9093-9071 PT Time Calculation (min) (ACUTE ONLY): 22 min   Charges:   PT Evaluation $PT Eval Moderate Complexity: 1 Mod PT Treatments $Therapeutic Activity: 8-22 mins PT General Charges $$ ACUTE PT VISIT: 1 Visit        Camie CHARLENA Kluver, PT, DPT 11:43 AM,09/21/23 Physical Therapist - [facility]

## 2023-09-22 DIAGNOSIS — N179 Acute kidney failure, unspecified: Secondary | ICD-10-CM | POA: Diagnosis not present

## 2023-09-22 DIAGNOSIS — N139 Obstructive and reflux uropathy, unspecified: Secondary | ICD-10-CM | POA: Diagnosis not present

## 2023-09-22 DIAGNOSIS — N1832 Chronic kidney disease, stage 3b: Secondary | ICD-10-CM | POA: Diagnosis not present

## 2023-09-22 LAB — GLUCOSE, CAPILLARY
Glucose-Capillary: 181 mg/dL — ABNORMAL HIGH (ref 70–99)
Glucose-Capillary: 161 mg/dL — ABNORMAL HIGH (ref 70–99)
Glucose-Capillary: 177 mg/dL — ABNORMAL HIGH (ref 70–99)
Glucose-Capillary: 84 mg/dL (ref 70–99)
Glucose-Capillary: 259 mg/dL — ABNORMAL HIGH (ref 70–99)

## 2023-09-22 LAB — CBC
HCT: 26.9 % — ABNORMAL LOW (ref 39.0–52.0)
Hemoglobin: 8.3 g/dL — ABNORMAL LOW (ref 13.0–17.0)
MCH: 27.2 pg (ref 26.0–34.0)
MCHC: 30.9 g/dL (ref 30.0–36.0)
MCV: 88.2 fL (ref 80.0–100.0)
Platelets: 188 K/uL (ref 150–400)
RBC: 3.05 MIL/uL — ABNORMAL LOW (ref 4.22–5.81)
RDW: 15.9 % — ABNORMAL HIGH (ref 11.5–15.5)
WBC: 10.7 K/uL — ABNORMAL HIGH (ref 4.0–10.5)
nRBC: 0 % (ref 0.0–0.2)

## 2023-09-22 LAB — BASIC METABOLIC PANEL WITH GFR
Anion gap: 10 (ref 5–15)
BUN: 52 mg/dL — ABNORMAL HIGH (ref 8–23)
CO2: 23 mmol/L (ref 22–32)
Calcium: 8.7 mg/dL — ABNORMAL LOW (ref 8.9–10.3)
Chloride: 104 mmol/L (ref 98–111)
Creatinine, Ser: 3.1 mg/dL — ABNORMAL HIGH (ref 0.61–1.24)
GFR, Estimated: 22 mL/min — ABNORMAL LOW (ref >60)
Glucose, Bld: 143 mg/dL — ABNORMAL HIGH (ref 70–99)
Potassium: 4.5 mmol/L (ref 3.5–5.1)
Sodium: 137 mmol/L (ref 135–145)

## 2023-09-22 LAB — SURGICAL PATHOLOGY

## 2023-09-22 NOTE — Progress Notes (Signed)
 PROGRESS NOTE OCEAN SCHILDT    DOB: Dec 03, 1960, 63 y.o.  FMW:[id number]    Code Status: Full Code   DOA: 09/16/2023   LOS: 6  Brief hospital course  Jay Flores is a 63 y.o. male, group home resident,  with medical history significant of hypertension, hyperlipidemia, diabetes mellitus, COPD, CKD-3B, HIV (CD 258 on 03/29/17 and VL 70 on 04/25/23), HIV dementia HCV (s/p of Harvoni), thrombocytopenia, prostate cancer, indwelling Foley catheter placement, iron  deficiency anemia, DVT/PE on Eliquis , who presents to ED  08/11 with lower abdominal pain and hematuria. 08/11: to ED, CT (+)large distended bladder with superior bladder wall diverticulum, progressive or worsening bilateral hydroureteronephrosis. WBC 22.8, worsening renal function, troponin 23 --> 24, lactic acid of 5.4 --> 5.3. Urology consult - 67 French catheter was removed and exchanged for a 22 [place] three-way Foley catheter, irrigated with about a liter and a half of saline and evacuated significant amount of clot, placed on CBI with symptoms improved.   09/22/23 -urine has gradually been clearing but remains positive for gross blood. Abdominal pain is resolved. Received 2nd u pRBC total and hgb stable. Patient tolerated cystoscopy well. Biopsy results pending. Oncology consulted to follow for results. Pending determination of medical decision maker to proceed with recommended nephrostomy tubes or not.   Assessment & Plan  Principal Problem:   Acute kidney injury superimposed on stage 3b chronic kidney disease (HCC) Active Problems:   Bilateral hydronephrosis   Urinary retention   Bladder wall thickening   DVT (deep venous thrombosis)_left leg   Pulmonary embolism (HCC)   CAD (coronary artery disease)   HTN (hypertension)   Type II diabetes mellitus with renal manifestations (HCC)   HLD (hyperlipidemia)   HIV (human immunodeficiency virus infection) (HCC)   Elevated lactic acid level   Anemia in chronic kidney disease  (CKD)   Tobacco use   Protein-calorie malnutrition, severe   AKI (acute kidney injury) (HCC)   Hematuria  AKI superimposed on stage 3b CKD with bilateral hydronephrosis due to acute urinary retention. Renal US  positive for: 1. Bilateral echogenic kidneys which may represent sequelae associated with medical renal disease. 2. Small left renal cyst. 3. Bilateral hydronephrosis, right greater than left. Baseline creatinine 2.62 on 08/16/2023.  Since admission has trended 7.03>>> 3.10 s/p foley replacement.  - urology following.  - continue foley  - cystoscopy with clot evacuation 8/14. Biopsy taken with suspicion for malignancy on inspection.  - oncology consulted- will follow up outpatient - continue IV Abx, UxCx positive for klebsiella pneumoniae and e faecalis. Cefazolin  changed to unasyn     Hematuria- CT shows large clot within the bladder causing obstruction, diffuse bladder wall thickening, suspicious for underlying neoplasm Hold Eliquis , consider restart when hgb stable and no further procedures planned by urology  Continuous bladder irrigation Urology following Continue foley   ABLA d/t hematuria see above Anemia chronic disease- hgb goal 8 or greater.  Treat underlying cause as above  Hold eliquis   Transfused 2nd u pRBCs 8/13, hgb remains stable at 8.8>8.3 today Ordering venofer  per hematology recs   Prostate cancer status post external beam radiation, completed in June 2025.  Chronic urinary retention managed with a Foley catheter. first noted to be in urinary retention in August 2022. He has his catheter exchanged every 4 weeks in the urology clinic.  Continue Proscar  and Flomax     DVT left leg and pulmonary embolism-  not acutely exacerbated Temporarily hold Eliquis  SCDs   CAD  HLD Troponin 23 -->  26, possibly due to demand ischemia. Continue Lipitor   HTN Hold Cozaar  due to worsening renal function. BP has maintained WNL   Type II diabetes mellitus with renal  manifestations [facility]): Recent A1c 6.3, well-controlled.   Patient is taking Jardiance and metformin SSI   HIV (human immunodeficiency virus infection) (HCC) Continue Juluca     Tobacco use Nicotine  patch  Body mass index is 15.96 kg/m.  VTE ppx: Place and maintain sequential compression device Start: 09/17/23 1256 SCDs Start: 09/16/23 2126  Diet:     Diet   Diet regular Room service appropriate? Yes; Fluid consistency: Thin   Consultants: Urology  Oncology  Palliative   Subjective 09/22/23    Pt reports feeling well. Denies dysuria, pelvic pain. No concerns today.    Objective  Blood pressure 136/83, pulse 73, temperature (!) 97.3 F (36.3 C), resp. rate 18, height 5' 8 (1.727 m), weight 47.6 kg, SpO2 100%.  Intake/Output Summary (Last 24 hours) at 09/22/2023 0743 Last data filed at 09/22/2023 0423 Gross per 24 hour  Intake 970 ml  Output 1450 ml  Net -480 ml   Filed Weights   09/16/23 1345 09/19/23 1312  Weight: 47.6 kg 47.6 kg     Physical Exam:  General: awake, alert, NAD, frail Respiratory: normal respiratory effort. Gastrointestinal: soft, NT, ND Nervous: A&O x3. no gross focal neurologic deficits, normal speech Extremities: moves all equally, no edema, normal tone Skin: dry, intact, normal temperature, normal color. No rashes, lesions or ulcers on exposed skin Psychiatry: normal mood, congruent affect  Labs   I have personally reviewed the following labs and imaging studies CBC    Component Value Date/Time   WBC 10.7 (H) 09/22/2023 0428   RBC 3.05 (L) 09/22/2023 0428   HGB 8.3 (L) 09/22/2023 0428   HGB 9.3 (L) 05/30/2023 1025   HGB 8.2 (L) 04/25/2023 0709   HCT 26.9 (L) 09/22/2023 0428   HCT 24.7 (L) 04/25/2023 0709   PLT 188 09/22/2023 0428   PLT 174 05/30/2023 1025   PLT 128 (L) 04/25/2023 0709   MCV 88.2 09/22/2023 0428   MCV 96 04/25/2023 0709   MCH 27.2 09/22/2023 0428   MCHC 30.9 09/22/2023 0428   RDW 15.9 (H) 09/22/2023 0428   RDW  13.9 04/25/2023 0709   LYMPHSABS 1.3 08/22/2023 1533   LYMPHSABS 1.7 04/25/2023 0709   MONOABS 0.3 08/22/2023 1533   EOSABS 0.1 08/22/2023 1533   EOSABS 0.1 04/25/2023 0709   BASOSABS 0.0 08/22/2023 1533   BASOSABS 0.0 04/25/2023 0709      Latest Ref Rng & Units 09/22/2023    4:28 AM 09/21/2023    5:46 AM 09/20/2023    6:18 AM  BMP  Glucose 70 - 99 mg/dL 856  842  820   BUN 8 - 23 mg/dL 52  54  53   Creatinine 0.61 - 1.24 mg/dL 6.89  6.58  6.10   Sodium 135 - 145 mmol/L 137  136  140   Potassium 3.5 - 5.1 mmol/L 4.5  4.1  4.8   Chloride 98 - 111 mmol/L 104  105  107   CO2 22 - 32 mmol/L 23  24  23    Calcium  8.9 - 10.3 mg/dL 8.7  8.9  8.9     No results found.   Disposition Plan & Communication  Patient status: Inpatient  Admitted From: Home Planned disposition location: Home Anticipated discharge date: 8/18 pending hgb stability, urology workup  Family Communication: none at bedside  Author: Marien LITTIE Piety, DO [facility] 09/22/2023, 7:43 AM   Available by Epic secure chat 7AM-7PM. If 7PM-7AM, please contact night-coverage.  [facility] contact information found on [email or web address].

## 2023-09-22 NOTE — Plan of Care (Signed)

## 2023-09-23 DIAGNOSIS — Z515 Encounter for palliative care: Secondary | ICD-10-CM | POA: Diagnosis not present

## 2023-09-23 DIAGNOSIS — N179 Acute kidney failure, unspecified: Secondary | ICD-10-CM | POA: Diagnosis not present

## 2023-09-23 DIAGNOSIS — R310 Gross hematuria: Secondary | ICD-10-CM | POA: Diagnosis not present

## 2023-09-23 DIAGNOSIS — N139 Obstructive and reflux uropathy, unspecified: Secondary | ICD-10-CM | POA: Diagnosis not present

## 2023-09-23 DIAGNOSIS — N1330 Unspecified hydronephrosis: Secondary | ICD-10-CM | POA: Diagnosis not present

## 2023-09-23 DIAGNOSIS — N1832 Chronic kidney disease, stage 3b: Secondary | ICD-10-CM | POA: Diagnosis not present

## 2023-09-23 DIAGNOSIS — R338 Other retention of urine: Secondary | ICD-10-CM | POA: Diagnosis not present

## 2023-09-23 DIAGNOSIS — Z7189 Other specified counseling: Secondary | ICD-10-CM | POA: Diagnosis not present

## 2023-09-23 DIAGNOSIS — C679 Malignant neoplasm of bladder, unspecified: Secondary | ICD-10-CM | POA: Diagnosis not present

## 2023-09-23 LAB — GLUCOSE, CAPILLARY
Glucose-Capillary: 165 mg/dL — ABNORMAL HIGH (ref 70–99)
Glucose-Capillary: 269 mg/dL — ABNORMAL HIGH (ref 70–99)
Glucose-Capillary: 171 mg/dL — ABNORMAL HIGH (ref 70–99)
Glucose-Capillary: 153 mg/dL — ABNORMAL HIGH (ref 70–99)

## 2023-09-23 NOTE — Plan of Care (Signed)

## 2023-09-23 NOTE — Progress Notes (Signed)
 Palliative:  HPI: 63 y.o. male  with past medical history of HTN, HLD, T2DM, COPD, CKD stage 3B, HIV with demenita, HCV, thrombocytopenia, recent history of prostate cancer-completed radiation treatment (07/2023), chronic indewelling Foley catheter, DVT/PE on Eliquis  and iron  deficiency anemia admitted from Group Home on 09/16/2023 with lower abdominal pain and hematuria. Received outpatient iron  transfusion on 8/11, developed chest pain and lower abdominal pain prior to leaving and was transferred to ED.   CT imaging revealed large distended bladder with superior bladder wall diverticulum, bilateral hydronephrosis, leukocytosis and worsening renal function   Urology consulted, CBI--s/p cystoscopy with clot evacuation 8/14 biopsy results pending due to suspicion for malignancy   Being treated for UTI with positive urine culture, significant AKI on CKD (improving), ABLA due to hematuria s/p multiple blood transfusions   Noted 4IP admits within last 6 months   Palliative medicine was consulted for assisting with goals of care conversations.   Discussion: I met today with Jay Flores but no family or visitors at bedside. He is lying in bed. Denies any pain or discomfort. When asked about his condition and path forward he tells me that he is having a procedure. He does after a moment tell me that he has cancer. He seems to grasp the general ideas but not the nuanced details of his situation and consequences of decision making. I ask him who helps him make difficult decisions and he tells me nobody. When I ask about family that he speaks with he tells me Jay Flores. He gives me permission to speak with his sisters to help us  figure out best course of action for him with this new cancer.   I called and spoke with sister, Jay Flores. Jay Flores and I discussed pathology results and concerns for aggressive underlying cancer. We discussed difficult decisions moving forward and that options may be limited due to other factors  such as kidney function. We also discussed nephrostomy tubes. Jay Flores is overwhelmed with information. We discussed time to process and plan to have conference call all together to discuss how to move forward over the next 1-2 days. I am hoping that oncology can further weigh in to give some further guidance on options for treatment so that we can make other decisions for care.   We did discuss HCPOA and that Jay Flores can not act as sole Management consultant given her position over the facility that Jay Flores resides in. I did share that Jay Flores and Jay Flores can include Jay Flores in conversations if they desire to do so. Jay Flores shares that she would want Jay Flores included. There are no other family outside of sisters Jay Flores and Jay Flores according to [place].   All questions/concerns addressed. Emotional support provided.   Exam: Awake, alert. Thin, frail. Mostly oriented although impaired insight. No distress. Good spirits. Breathing regular, unlabored. Abd soft. Moves all extremities.   Plan: - Hopeful oncology can weigh in further now that pathology is back - Family meeting over the next 1-2 days to determine path forward  50 min  Jay Kitty, NP Palliative Medicine Team Pager [phone] (Please see [email or web address] for schedule) Team Phone [phone]

## 2023-09-23 NOTE — Progress Notes (Signed)
 PROGRESS NOTE Jay Flores    DOB: 1960-05-05, 63 y.o.  FMW:[id number]    Code Status: Full Code   DOA: 09/16/2023   LOS: 7  Brief hospital course  Jay Flores is a 63 y.o. male, group home resident,  with medical history significant of hypertension, hyperlipidemia, diabetes mellitus, COPD, CKD-3B, HIV (CD 258 on 03/29/17 and VL 70 on 04/25/23), HIV dementia HCV (s/p of Harvoni), thrombocytopenia, prostate cancer, indwelling Foley catheter placement, iron  deficiency anemia, DVT/PE on Eliquis , who presents to ED  08/11 with lower abdominal pain and hematuria. 08/11: to ED, CT (+)large distended bladder with superior bladder wall diverticulum, progressive or worsening bilateral hydroureteronephrosis. WBC 22.8, worsening renal function, troponin 23 --> 24, lactic acid of 5.4 --> 5.3. Urology consult - 74 French catheter was removed and exchanged for a 22 [place] three-way Foley catheter, irrigated with about a liter and a half of saline and evacuated significant amount of clot, placed on CBI with symptoms improved.   09/23/23 -urine has gradually been clearing but remains positive for gross blood. Abdominal pain is resolved. Received 2nd u pRBC total and hgb stable. Patient tolerated cystoscopy well. Biopsy results pending. Oncology consulted to follow for results. Pending determination of medical decision maker to proceed with recommended nephrostomy tubes or not. Appreciate palliative and urology following.   Assessment & Plan  Principal Problem:   Acute kidney injury superimposed on stage 3b chronic kidney disease (HCC) Active Problems:   Bilateral hydronephrosis   Urinary retention   Bladder wall thickening   DVT (deep venous thrombosis)_left leg   Pulmonary embolism (HCC)   CAD (coronary artery disease)   HTN (hypertension)   Type II diabetes mellitus with renal manifestations (HCC)   HLD (hyperlipidemia)   HIV (human immunodeficiency virus infection) (HCC)   Elevated lactic acid  level   Anemia in chronic kidney disease (CKD)   Tobacco use   Protein-calorie malnutrition, severe   AKI (acute kidney injury) (HCC)   Hematuria  AKI superimposed on stage 3b CKD with bilateral hydronephrosis due to acute urinary retention. Renal US  positive for: 1. Bilateral echogenic kidneys which may represent sequelae associated with medical renal disease. 2. Small left renal cyst. 3. Bilateral hydronephrosis, right greater than left. Baseline creatinine 2.62 on 08/16/2023.  Since admission has trended 7.03>>> 3.10 s/p foley replacement.  - urology following.  - continue foley  - cystoscopy with clot evacuation 8/14. Biopsy taken with suspicion for malignancy on inspection.  - oncology consulted- will follow up outpatient - continue IV Abx, UxCx positive for klebsiella pneumoniae and e faecalis. Cefazolin  changed to unasyn     Hematuria- CT shows large clot within the bladder causing obstruction, diffuse bladder wall thickening, suspicious for underlying neoplasm Hold Eliquis , consider restart when hgb stable and no further procedures planned by urology  Continuous bladder irrigation Urology following Continue foley   ABLA d/t hematuria see above Anemia chronic disease- hgb goal 8 or greater.  Treat underlying cause as above  Hold eliquis   Transfused 2nd u pRBCs 8/13, hgb remains stable at 8.8>8.3 today Ordering venofer  per hematology recs   Prostate cancer status post external beam radiation, completed in June 2025.  Chronic urinary retention managed with a Foley catheter. first noted to be in urinary retention in August 2022. He has his catheter exchanged every 4 weeks in the urology clinic.  Continue Proscar  and Flomax     DVT left leg and pulmonary embolism-  not acutely exacerbated Temporarily hold Eliquis  SCDs   CAD  HLD Troponin 23 --> 26, possibly due to demand ischemia. Continue Lipitor   HTN Hold Cozaar  due to worsening renal function. BP has maintained WNL    Type II diabetes mellitus with renal manifestations [facility]): Recent A1c 6.3, well-controlled.   Patient is taking Jardiance and metformin SSI   HIV (human immunodeficiency virus infection) (HCC) Continue Juluca     Tobacco use Nicotine  patch  Body mass index is 15.96 kg/m.  VTE ppx: Place and maintain sequential compression device Start: 09/17/23 1256 SCDs Start: 09/16/23 2126  Diet:     Diet   Diet regular Room service appropriate? Yes; Fluid consistency: Thin   Consultants: Urology  Oncology  Palliative   Subjective 09/23/23    Pt reports feeling well. Denies dysuria, pelvic pain. No concerns today.    Objective  Blood pressure 136/83, pulse 73, temperature (!) 97.3 F (36.3 C), resp. rate 18, height 5' 8 (1.727 m), weight 47.6 kg, SpO2 100%.  Intake/Output Summary (Last 24 hours) at 09/23/2023 0735 Last data filed at 09/22/2023 2301 Gross per 24 hour  Intake 620 ml  Output 775 ml  Net -155 ml   Filed Weights   09/16/23 1345 09/19/23 1312  Weight: 47.6 kg 47.6 kg     Physical Exam:  General: awake, alert, NAD, frail Respiratory: normal respiratory effort. Gastrointestinal: soft, NT, ND Nervous: A&O x3. no gross focal neurologic deficits, normal speech Extremities: moves all equally, no edema, normal tone Skin: dry, intact, normal temperature, normal color. No rashes, lesions or ulcers on exposed skin Psychiatry: normal mood, congruent affect  Labs   I have personally reviewed the following labs and imaging studies CBC    Component Value Date/Time   WBC 10.7 (H) 09/22/2023 0428   RBC 3.05 (L) 09/22/2023 0428   HGB 8.3 (L) 09/22/2023 0428   HGB 9.3 (L) 05/30/2023 1025   HGB 8.2 (L) 04/25/2023 0709   HCT 26.9 (L) 09/22/2023 0428   HCT 24.7 (L) 04/25/2023 0709   PLT 188 09/22/2023 0428   PLT 174 05/30/2023 1025   PLT 128 (L) 04/25/2023 0709   MCV 88.2 09/22/2023 0428   MCV 96 04/25/2023 0709   MCH 27.2 09/22/2023 0428   MCHC 30.9 09/22/2023 0428    RDW 15.9 (H) 09/22/2023 0428   RDW 13.9 04/25/2023 0709   LYMPHSABS 1.3 08/22/2023 1533   LYMPHSABS 1.7 04/25/2023 0709   MONOABS 0.3 08/22/2023 1533   EOSABS 0.1 08/22/2023 1533   EOSABS 0.1 04/25/2023 0709   BASOSABS 0.0 08/22/2023 1533   BASOSABS 0.0 04/25/2023 0709      Latest Ref Rng & Units 09/22/2023    4:28 AM 09/21/2023    5:46 AM 09/20/2023    6:18 AM  BMP  Glucose 70 - 99 mg/dL 856  842  820   BUN 8 - 23 mg/dL 52  54  53   Creatinine 0.61 - 1.24 mg/dL 6.89  6.58  6.10   Sodium 135 - 145 mmol/L 137  136  140   Potassium 3.5 - 5.1 mmol/L 4.5  4.1  4.8   Chloride 98 - 111 mmol/L 104  105  107   CO2 22 - 32 mmol/L 23  24  23    Calcium  8.9 - 10.3 mg/dL 8.7  8.9  8.9     No results found.   Disposition Plan & Communication  Patient status: Inpatient  Admitted From: Home Planned disposition location: Home Anticipated discharge date: 8/18 pending hgb stability, urology workup  Family Communication: none  at bedside    Author: Marien LITTIE Piety, DO [facility] 09/23/2023, 7:35 AM   Available by Epic secure chat 7AM-7PM. If 7PM-7AM, please contact night-coverage.  [facility] contact information found on [email or web address].

## 2023-09-23 NOTE — Progress Notes (Addendum)
 Urology Inpatient Progress Note  Subjective: No acute events overnight. He is afebrile, VSS. No a.m. labs available. 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 Creatinine continues to slowly downtrend, 3.10 yesterday. Foley catheter in place draining yellow urine, CBI clamped. He is asleep in bed on my arrival.  He reports fatigue but denies pain.  Anti-infectives: Anti-infectives (From admission, onward)    Start     Dose/Rate Route Frequency Ordered Stop   09/20/23 1115  Ampicillin -Sulbactam (UNASYN ) 3 g in sodium chloride  0.9 % 100 mL IVPB        3 g 200 mL/hr over 30 Minutes Intravenous Every 12 hours 09/20/23 1032     09/20/23 0600  ceFAZolin  (ANCEF ) IVPB 1 g/50 mL premix  Status:  Discontinued        1 g 100 mL/hr over 30 Minutes Intravenous Every 12 hours 09/19/23 1106 09/20/23 1032   09/17/23 1000  dolutegravir -rilpivirine  (JULUCA ) 50-25 MG per tablet 1 tablet        1 tablet Oral Daily 09/17/23 0147     09/17/23 0600  cefTRIAXone  (ROCEPHIN ) 2 g in sodium chloride  0.9 % 100 mL IVPB  Status:  Discontinued        2 g 200 mL/hr over 30 Minutes Intravenous Every 24 hours 09/16/23 2207 09/19/23 1106   09/16/23 1645  ceFEPIme  (MAXIPIME ) 2 g in sodium chloride  0.9 % 100 mL IVPB        2 g 200 mL/hr over 30 Minutes Intravenous  Once 09/16/23 1639 09/16/23 1840   09/16/23 1645  metroNIDAZOLE  (FLAGYL ) IVPB 500 mg        500 mg 100 mL/hr over 60 Minutes Intravenous  Once 09/16/23 1639 09/16/23 1840       Current Facility-Administered Medications  Medication Dose Route Frequency Provider Last Rate Last Admin   acetaminophen  (TYLENOL ) tablet 650 mg  650 mg Oral Q6H PRN Niu, Xilin, MD       albuterol  (PROVENTIL ) (2.5 MG/3ML) 0.083% nebulizer solution 2.5 mg  2.5 mg Inhalation Q4H PRN Niu, Xilin, MD       Ampicillin -Sulbactam (UNASYN ) 3 g in sodium chloride  0.9 % 100 mL IVPB  3 g Intravenous  Q12H Anderson, Chelsey L, MD   Stopped at 09/22/23 2301   atorvastatin  (LIPITOR) tablet 20 mg  20 mg Oral QHS Niu, Xilin, MD   20 mg at 09/22/23 2222   Chlorhexidine  Gluconate Cloth 2 % PADS 6 each  6 each Topical Daily Alexander, Natalie, DO   6 each at 09/23/23 0901   cholecalciferol  (VITAMIN D3) 25 MCG (1000 UNIT) tablet 1,000 Units  1,000 Units Oral QHS Niu, Xilin, MD   1,000 Units at 09/22/23 2222   dextromethorphan-guaiFENesin  (MUCINEX  DM) 30-600 MG per 12 hr tablet 1 tablet  1 tablet Oral BID PRN Niu, Xilin, MD       dolutegravir -rilpivirine  (JULUCA ) 50-25 MG per tablet 1 tablet  1 tablet Oral Daily Niu, Xilin, MD   1 tablet at 09/23/23 0904   feeding supplement (ENSURE PLUS HIGH PROTEIN) liquid 237 mL  237 mL Oral BID BM Alexander, Natalie, DO   237 mL at 09/21/23 1400   finasteride  (PROSCAR ) tablet 5 mg  5 mg Oral Daily Niu, Xilin, MD   5 mg at 09/23/23 9096   folic acid  (FOLVITE ) tablet 1 mg  1 mg Oral Daily Niu, Xilin, MD   1 mg at 09/23/23 9096   insulin  aspart (novoLOG ) injection 0-9 Units  0-9 Units Subcutaneous TID WC Niu, Xilin, MD   2 Units at 09/23/23 9097   loperamide  (IMODIUM ) capsule 2 mg  2 mg Oral QID PRN Niu, Xilin, MD       metoprolol  succinate (TOPROL -XL) 24 hr tablet 100 mg  100 mg Oral Daily Niu, Xilin, MD   100 mg at 09/23/23 0903   multivitamin with minerals tablet 1 tablet  1 tablet Oral Daily Alexander, Natalie, DO   1 tablet at 09/22/23 1740   nicotine  (NICODERM CQ  - dosed in mg/24 hours) patch 21 mg  21 mg Transdermal Daily Niu, Xilin, MD   21 mg at 09/23/23 9095   ondansetron  (ZOFRAN ) injection 4 mg  4 mg Intravenous Q8H PRN Niu, Xilin, MD       oxyCODONE  (Oxy IR/ROXICODONE ) immediate release tablet 5 mg  5 mg Oral Q6H PRN Niu, Xilin, MD   5 mg at 09/22/23 1250   sodium bicarbonate  tablet 650 mg  650 mg Oral BID Niu, Xilin, MD   650 mg at 09/23/23 9096   sodium chloride  irrigation 0.9 % 3,000 mL  3,000 mL Irrigation Continuous Funke, Mary E, MD   3,000 mL at  09/19/23 9359   tamsulosin  (FLOMAX ) capsule 0.4 mg  0.4 mg Oral QPC supper Niu, Xilin, MD   0.4 mg at 09/22/23 1740   Vilazodone  HCl TABS 20 mg  20 mg Oral Daily Niu, Xilin, MD   20 mg at 09/23/23 9095   Objective: Vital signs in last 24 hours: Temp:  [97.5 F (36.4 C)-98.3 F (36.8 C)] 97.9 F (36.6 C) (08/18 0811) Pulse Rate:  [73-85] 80 (08/18 0811) Resp:  [16-19] 18 (08/18 0811) BP: (118-157)/(74-84) 157/79 (08/18 0811) SpO2:  [100 %] 100 % (08/18 0811)  Intake/Output from previous day: 08/17 0701 - 08/18 0700 In: 620 [P.O.:420; IV Piggyback:200] Out: 775 [Urine:775] Intake/Output this shift: No intake/output data recorded.  Physical Exam Vitals and nursing note reviewed.  Constitutional:      General: He is not in acute distress.    Appearance: He is cachectic. He is not toxic-appearing or diaphoretic.  Pulmonary:     Effort: Pulmonary effort is normal. No respiratory distress.  Skin:    General: Skin is warm and dry.  Psychiatric:        Mood and Affect: Mood normal.        Behavior: Behavior normal.    Lab Results:  Recent Labs    09/21/23 0546 09/22/23 0428  WBC 11.3* 10.7*  HGB 8.2* 8.3*  HCT 26.1* 26.9*  PLT 178 188   BMET Recent Labs    09/21/23 0546 09/22/23 0428  NA 136 137  K 4.1 4.5  CL 105 104  CO2 24 23  GLUCOSE 157* 143*  BUN 54* 52*  CREATININE 3.41* 3.10*  CALCIUM  8.9 8.7*   Assessment & Plan: 63 y.o. male with PMH prostate cancer s/p IMRT on 6 months of ADT, BPH with urinary retention on finasteride  managed with chronic Foley catheter, and a recent history of intermittent gross hematuria now admitted with gross hematuria with clot retention and AKI with bilateral hydronephrosis, POD 4 from cystoscopy and TURBT with Dr. 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  UOs were not identified intraoperatively.  Unfortunately, his surgical pathology has finalized with a rather aggressive and muscle invasive  bladder malignancy.  He is not a surgical candidate for cystectomy due to his comorbidities and radiation history.  Will defer to oncologic team regarding treatment options,  though anticipate these will be palliative in intent.  I have noticed throughout the course of this hospitalization that he is looking increasingly fatigued.  I am not sure how much he is getting out of bed at this point and have serious concerns that nephrostomy tube placement could cause him significant discomfort and worsen his quality of life.  I continue to hesitate to pursue these, especially since his creatinine remains downtrending.  That said, I understand that his renal function may limit his chemotherapeutic options.  This is a very challenging clinical scenario with very poor prognosis.  I recommend consideration of hospice at this time.  Alondra Vandeven, PA-C 09/23/2023

## 2023-09-23 NOTE — Plan of Care (Signed)

## 2023-09-24 DIAGNOSIS — C679 Malignant neoplasm of bladder, unspecified: Secondary | ICD-10-CM

## 2023-09-24 DIAGNOSIS — Z515 Encounter for palliative care: Secondary | ICD-10-CM | POA: Diagnosis not present

## 2023-09-24 DIAGNOSIS — N184 Chronic kidney disease, stage 4 (severe): Secondary | ICD-10-CM | POA: Diagnosis not present

## 2023-09-24 DIAGNOSIS — Z66 Do not resuscitate: Secondary | ICD-10-CM | POA: Diagnosis not present

## 2023-09-24 DIAGNOSIS — Z7189 Other specified counseling: Secondary | ICD-10-CM

## 2023-09-24 DIAGNOSIS — R339 Retention of urine, unspecified: Secondary | ICD-10-CM | POA: Diagnosis not present

## 2023-09-24 DIAGNOSIS — R319 Hematuria, unspecified: Secondary | ICD-10-CM | POA: Diagnosis not present

## 2023-09-24 DIAGNOSIS — N1330 Unspecified hydronephrosis: Secondary | ICD-10-CM | POA: Diagnosis not present

## 2023-09-24 DIAGNOSIS — N179 Acute kidney failure, unspecified: Secondary | ICD-10-CM | POA: Diagnosis not present

## 2023-09-24 LAB — GLUCOSE, CAPILLARY
Glucose-Capillary: 147 mg/dL — ABNORMAL HIGH (ref 70–99)
Glucose-Capillary: 477 mg/dL — ABNORMAL HIGH (ref 70–99)
Glucose-Capillary: 150 mg/dL — ABNORMAL HIGH (ref 70–99)

## 2023-09-24 LAB — BASIC METABOLIC PANEL WITH GFR
Anion gap: 10 (ref 5–15)
BUN: 41 mg/dL — ABNORMAL HIGH (ref 8–23)
CO2: 24 mmol/L (ref 22–32)
Calcium: 8.6 mg/dL — ABNORMAL LOW (ref 8.9–10.3)
Chloride: 105 mmol/L (ref 98–111)
Creatinine, Ser: 3.16 mg/dL — ABNORMAL HIGH (ref 0.61–1.24)
GFR, Estimated: 21 mL/min — ABNORMAL LOW (ref >60)
Glucose, Bld: 135 mg/dL — ABNORMAL HIGH (ref 70–99)
Potassium: 4.6 mmol/L (ref 3.5–5.1)
Sodium: 139 mmol/L (ref 135–145)

## 2023-09-24 LAB — CBC
HCT: 26.5 % — ABNORMAL LOW (ref 39.0–52.0)
Hemoglobin: 8.1 g/dL — ABNORMAL LOW (ref 13.0–17.0)
MCH: 27.3 pg (ref 26.0–34.0)
MCHC: 30.6 g/dL (ref 30.0–36.0)
MCV: 89.2 fL (ref 80.0–100.0)
Platelets: 209 K/uL (ref 150–400)
RBC: 2.97 MIL/uL — ABNORMAL LOW (ref 4.22–5.81)
RDW: 16 % — ABNORMAL HIGH (ref 11.5–15.5)
WBC: 10.7 K/uL — ABNORMAL HIGH (ref 4.0–10.5)
nRBC: 0 % (ref 0.0–0.2)

## 2023-09-24 MED ORDER — QUETIAPINE FUMARATE 25 MG PO TABS
50.0000 mg | ORAL_TABLET | Freq: Two times a day (BID) | ORAL | Status: DC | PRN
  Administered 2023-09-24: 50 mg via ORAL
  Filled 2023-09-24 (×2): qty 2

## 2023-09-24 MED ORDER — HALOPERIDOL LACTATE 5 MG/ML IJ SOLN
1.0000 mg | Freq: Four times a day (QID) | INTRAMUSCULAR | Status: DC | PRN
  Administered 2023-09-24: 1 mg via INTRAVENOUS
  Filled 2023-09-24: qty 1

## 2023-09-24 MED ORDER — INSULIN ASPART 100 UNIT/ML IJ SOLN
10.0000 [IU] | Freq: Once | INTRAMUSCULAR | Status: AC
  Administered 2023-09-24: 10 [IU] via SUBCUTANEOUS
  Filled 2023-09-24: qty 1

## 2023-09-24 NOTE — Progress Notes (Signed)
 Hematology/Oncology Progress note [phone] [phone]     Patient Care Team: [name], MD as PCP - General (Infectious Diseases) Quintella Fillers, NP (Inactive) as Nurse Practitioner (Family Medicine) Babara Call, MD as Consulting Physician (Oncology)   Name of the patient: Jay Flores  [id number]  [birth year]  Date of visit: 09/24/23   INTERVAL HISTORY-   09/19/2023, bladder biopsy showed 1. 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 Bladder, biopsy, Base of bladder mass :       INFILTRATING SQUAMOUS CELL CARCINOMA       THE CARCINOMA INVADES MUSCULARIS PROPRIA (DETRUSOR MUSCLE)      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    Priority: Medium    Tobacco use 04/17/2018    Priority: Medium    Hematuria 09/20/2023   Acute kidney injury superimposed on stage 3b chronic kidney disease (HCC) 09/16/2023   Bilateral hydronephrosis 09/16/2023   Urinary retention 09/16/2023   CAD (coronary artery disease) 09/16/2023   DVT (deep venous thrombosis)_left leg 09/16/2023   Pulmonary embolism (HCC) 09/16/2023   Iron  deficiency anemia 09/16/2023   Bladder wall thickening 09/16/2023   Protein-calorie malnutrition, severe 08/01/2023   NSTEMI (non-ST elevated myocardial infarction) (HCC) 07/29/2023   Diabetes mellitus type 2, noninsulin dependent (HCC) 07/29/2023   CKD (chronic kidney disease) stage 4, GFR 15-29 ml/min (HCC) 07/29/2023   Leukocytosis 07/29/2023   Right pulmonary infiltrate on CXR 07/29/2023   Community acquired pneumonia 07/06/2023   Chest discomfort  07/06/2023   Depression 07/06/2023   HLD (hyperlipidemia) 04/24/2023   Syncope 04/24/2023   Type II diabetes mellitus with renal manifestations (HCC) 04/24/2023   Elevated lactic acid level 04/24/2023   Callus 11/23/2021   Pain due to onychomycosis of toenails of both feet 06/15/2021   Diabetic neuropathy (HCC) 06/15/2021   Bladder dysfunction 05/07/2021   H/O urinary retention 05/07/2021   DKA (diabetic ketoacidosis) (HCC) 03/29/2017   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   Leg weakness, bilateral 01/18/2016   Difficulty walking 12/19/2015   Hyperreflexia of lower extremity 12/19/2015   Muscle spasm of both lower legs 12/19/2015   HTN (hypertension) 02/19/2012   Weakness 02/19/2012   Metabolic acidosis 01/27/2012   Hypokalemia 01/23/2012   PNA (pneumonia) 01/22/2012   HIV (human immunodeficiency virus infection) (HCC) 01/22/2012   Brain lesion 01/22/2012   AKI (acute kidney injury) (HCC) 01/22/2012   Anemia 01/22/2012   Altered mental status 01/22/2012   Hepatitis C infection 10/05/2011     Past Medical History:  Diagnosis Date   Acute renal failure (HCC)    Altered mental status 01/20/2012   Anemia    Brain lesion    Cholelithiasis    Chronic indwelling Foley catheter    CKD (chronic kidney disease) stage 3, GFR 30-59 ml/min (HCC)    COPD (chronic obstructive pulmonary disease) (HCC)    Diabetic neuropathy (HCC)    DKA (diabetic ketoacidosis) (HCC)    DM (diabetes mellitus), type 2 (HCC)    Elevated  PSA    Gait instability    uses walker   H/O urinary retention    Hepatitis C infection    s/p Harvoni   History of cocaine use    last used in 2012   HIV (human immunodeficiency virus infection) (HCC)    HIV dementia (HCC)    Hypertension    IPMN (intraductal papillary mucinous neoplasm)    Lives in group home    [facility]   Metabolic acidosis    Pancreatic cyst    Pneumonia    Thrombocytopenia (HCC)    Tobacco use      Past  Surgical History:  Procedure Laterality Date   COLONOSCOPY WITH PROPOFOL  N/A 09/05/2022   Procedure: COLONOSCOPY WITH PROPOFOL ;  Surgeon: Toledo, Ladell POUR, MD;  Location: [facility] ENDOSCOPY;  Service: Gastroenterology;  Laterality: N/A;   CYSTOSCOPY  09/19/2023   Procedure: CYSTOSCOPY;  Surgeon: Twylla Glendia BROCKS, MD;  Location: [facility] ORS;  Service: Urology;;   EUS N/A 04/19/2022   Procedure: UPPER ENDOSCOPIC ULTRASOUND (EUS) LINEAR;  Surgeon: Elta Fonda SQUIBB, MD;  Location: [facility] ENDOSCOPY;  Service: Gastroenterology;  Laterality: N/A;  Requesting 1p slot   HEMOSTASIS CLIP PLACEMENT  09/05/2022   Procedure: HEMOSTASIS CLIP PLACEMENT;  Surgeon: Aundria, Ladell POUR, MD;  Location: [facility] ENDOSCOPY;  Service: Gastroenterology;;   POLYPECTOMY  09/05/2022   Procedure: POLYPECTOMY;  Surgeon: Aundria, Ladell POUR, MD;  Location: [facility] ENDOSCOPY;  Service: Gastroenterology;;   PROSTATE BIOPSY N/A 02/26/2023   Procedure: PROSTATE BIOPSY;  Surgeon: Twylla Glendia BROCKS, MD;  Location: [facility] ORS;  Service: Urology;  Laterality: N/A;   TRANSRECTAL ULTRASOUND N/A 02/26/2023   Procedure: TRANSRECTAL ULTRASOUND;  Surgeon: Twylla Glendia BROCKS, MD;  Location: [facility] ORS;  Service: Urology;  Laterality: N/A;   TRANSURETHRAL RESECTION OF BLADDER TUMOR  09/19/2023   Procedure: TURBT (TRANSURETHRAL RESECTION OF BLADDER TUMOR);  Surgeon: Twylla Glendia BROCKS, MD;  Location: [facility] ORS;  Service: Urology;;    Social History   Socioeconomic History   Marital status: Single    Spouse name: Not on file   Number of children: Not on file   Years of education: Not on file   Highest education level: Not on file  Occupational History   Occupation: disabled  Tobacco Use   Smoking status: Every Day    Current packs/day: 1.00    Average packs/day: 1 pack/day for 34.0 years (34.0 ttl pk-yrs)    Types: Cigarettes    Passive exposure: Current   Smokeless tobacco: Never  Vaping Use   Vaping status: Never Used  Substance and Sexual Activity    Alcohol use: No   Drug use: Not Currently    Types: Cocaine    Comment: 01/22/2012 last used cocaine ~ 2012   Sexual activity: Not Currently  Other Topics Concern   Not on file  Social History Narrative   Not on file   Social Drivers of Health   Financial Resource Strain: Not on file  Food Insecurity: No Food Insecurity (09/17/2023)   Hunger Vital Sign    Worried About Running Out of Food in the Last Year: Never true    Ran Out of Food in the Last Year: Never true  Transportation Needs: No Transportation Needs (09/17/2023)   PRAPARE - Administrator, Civil Service (Medical): No    Lack of Transportation (Non-Medical): No  Physical Activity: Not on file  Stress: Not on file  Social Connections: Not on file  Intimate Partner Violence: Not  At Risk (09/17/2023)   Humiliation, Afraid, Rape, and Kick questionnaire    Fear of Current or Ex-Partner: No    Emotionally Abused: No    Physically Abused: No    Sexually Abused: No     Family History  Problem Relation Age of Onset   Diabetes Mellitus II Mother      Current Facility-Administered Medications:    acetaminophen  (TYLENOL ) tablet 650 mg, 650 mg, Oral, Q6H PRN, Niu, Xilin, MD   albuterol  (PROVENTIL ) (2.5 MG/3ML) 0.083% nebulizer solution 2.5 mg, 2.5 mg, Inhalation, Q4H PRN, Niu, Xilin, MD   Ampicillin -Sulbactam (UNASYN ) 3 g in sodium chloride  0.9 % 100 mL IVPB, 3 g, Intravenous, Q12H, Lenon Pons L, MD, Last Rate: 200 mL/hr at 09/24/23 2300, 3 g at 09/24/23 2300   atorvastatin  (LIPITOR) tablet 20 mg, 20 mg, Oral, QHS, Niu, Xilin, MD, 20 mg at 09/23/23 2100   Chlorhexidine  Gluconate Cloth 2 % PADS 6 each, 6 each, Topical, Daily, Marsa Edelman, DO, 6 each at 09/23/23 0901   cholecalciferol  (VITAMIN D3) 25 MCG (1000 UNIT) tablet 1,000 Units, 1,000 Units, Oral, QHS, Niu, Xilin, MD, 1,000 Units at 09/23/23 2100   dextromethorphan-guaiFENesin  (MUCINEX  DM) 30-600 MG per 12 hr tablet 1 tablet, 1 tablet, Oral, BID  PRN, Niu, Xilin, MD   dolutegravir -rilpivirine  (JULUCA ) 50-25 MG per tablet 1 tablet, 1 tablet, Oral, Daily, Niu, Xilin, MD, 1 tablet at 09/23/23 9095   feeding supplement (ENSURE PLUS HIGH PROTEIN) liquid 237 mL, 237 mL, Oral, BID BM, Alexander, Natalie, DO, 237 mL at 09/21/23 1400   finasteride  (PROSCAR ) tablet 5 mg, 5 mg, Oral, Daily, Niu, Xilin, MD, 5 mg at 09/23/23 9096   folic acid  (FOLVITE ) tablet 1 mg, 1 mg, Oral, Daily, Niu, Xilin, MD, 1 mg at 09/23/23 9096   haloperidol  lactate (HALDOL ) injection 1 mg, 1 mg, Intravenous, Q6H PRN, Lenon Pons CROME, MD, 1 mg at 09/24/23 1141   insulin  aspart (novoLOG ) injection 0-9 Units, 0-9 Units, Subcutaneous, TID WC, Niu, Xilin, MD, 1 Units at 09/24/23 1936   loperamide  (IMODIUM ) capsule 2 mg, 2 mg, Oral, QID PRN, Niu, Xilin, MD   metoprolol  succinate (TOPROL -XL) 24 hr tablet 100 mg, 100 mg, Oral, Daily, Niu, Xilin, MD, 100 mg at 09/23/23 9096   multivitamin with minerals tablet 1 tablet, 1 tablet, Oral, Daily, Alexander, Natalie, DO, 1 tablet at 09/23/23 1734   nicotine  (NICODERM CQ  - dosed in mg/24 hours) patch 21 mg, 21 mg, Transdermal, Daily, Niu, Xilin, MD, 21 mg at 09/23/23 9095   ondansetron  (ZOFRAN ) injection 4 mg, 4 mg, Intravenous, Q8H PRN, Niu, Xilin, MD   oxyCODONE  (Oxy IR/ROXICODONE ) immediate release tablet 5 mg, 5 mg, Oral, Q6H PRN, Niu, Xilin, MD, 5 mg at 09/24/23 1453   QUEtiapine  (SEROQUEL ) tablet 50 mg, 50 mg, Oral, BID PRN, Lenon Pons CROME, MD, 50 mg at 09/24/23 1453   sodium bicarbonate  tablet 650 mg, 650 mg, Oral, BID, Niu, Xilin, MD, 650 mg at 09/23/23 2100   Continuous Bladder Irrigation, , , Until Discontinued **AND** sodium chloride  irrigation 0.9 % 3,000 mL, 3,000 mL, Irrigation, Continuous, Ernest Ronal BRAVO, MD, 3,000 mL at 09/19/23 9359   tamsulosin  (FLOMAX ) capsule 0.4 mg, 0.4 mg, Oral, QPC supper, Niu, Xilin, MD, 0.4 mg at 09/23/23 1734   Vilazodone  HCl TABS 20 mg, 20 mg, Oral, Daily, Niu, Xilin, MD, 20 mg at 09/23/23  0904   Physical exam:  Vitals:   09/24/23 0444 09/24/23 0718 09/24/23 1603 09/24/23 1953  BP: 133/78 (!) 153/86 ROLLEN)  162/86 137/65  Pulse: 79 78 96 80  Resp: 14 16 16 18   Temp: 98.1 F (36.7 C) 98.5 F (36.9 C) 97.7 F (36.5 C) 98 F (36.7 C)  TempSrc: Oral  Oral   SpO2: 100% 100% 100% 100%  Weight:      Height:       Physical Exam Constitutional:      General: He is not in acute distress.    Appearance: He is ill-appearing. He is not diaphoretic.     Comments: Cachectic  HENT:     Head: Normocephalic and atraumatic.  Eyes:     General: No scleral icterus. Cardiovascular:     Rate and Rhythm: Normal rate and regular rhythm.  Pulmonary:     Effort: Pulmonary effort is normal. No respiratory distress.     Breath sounds: Normal breath sounds.  Abdominal:     General: Bowel sounds are normal. There is no distension.  Genitourinary:    Comments: Foley catheter Musculoskeletal:     Cervical back: Normal range of motion and neck supple.  Skin:    General: Skin is warm and dry.     Findings: No erythema.  Neurological:     Mental Status: He is alert.     Cranial Nerves: No cranial nerve deficit.     Motor: No abnormal muscle tone.     Comments: Minimally interactive, lethargic  Psychiatric:        Mood and Affect: Affect normal.       Labs    Latest Ref Rng & Units 09/24/2023    7:55 AM 09/22/2023    4:28 AM 09/21/2023    5:46 AM  CBC  WBC 4.0 - 10.5 K/uL 10.7  10.7  11.3   Hemoglobin 13.0 - 17.0 g/dL 8.1  8.3  8.2   Hematocrit 39.0 - 52.0 % 26.5  26.9  26.1   Platelets 150 - 400 K/uL 209  188  178       Latest Ref Rng & Units 09/24/2023    7:55 AM 09/22/2023    4:28 AM 09/21/2023    5:46 AM  CMP  Glucose 70 - 99 mg/dL 864  856  842   BUN 8 - 23 mg/dL 41  52  54   Creatinine 0.61 - 1.24 mg/dL 6.83  6.89  6.58   Sodium 135 - 145 mmol/L 139  137  136   Potassium 3.5 - 5.1 mmol/L 4.6  4.5  4.1   Chloride 98 - 111 mmol/L 105  104  105   CO2 22 - 32 mmol/L 24   23  24    Calcium  8.9 - 10.3 mg/dL 8.6  8.7  8.9      RADIOGRAPHIC STUDIES: I have personally reviewed the radiological images as listed and agreed with the findings in the report. DG OR UROLOGY CYSTO IMAGE ([facility] ONLY) Result Date: 09/19/2023 There is no interpretation for this exam.  This order is for images obtained during a surgical procedure.  Please See Surgeries Tab for more information regarding the procedure.   US  RENAL Result Date: 09/18/2023 CLINICAL DATA:  Gross hematuria. EXAM: RENAL / URINARY TRACT ULTRASOUND COMPLETE COMPARISON:  None Available. FINDINGS: Right Kidney: Renal measurements: 9.1 cm x 5.2 cm x 6.0 cm = volume: 148 mL. Diffusely increased echogenicity of the renal parenchyma is seen. No mass is visualized. There is moderate severity right-sided hydronephrosis. Left Kidney: Renal measurements: 9.5 cm x 5.4 cm x 4.1 cm = volume: 111.6 mL.  Diffusely increased echogenicity of the renal parenchyma is seen. 0.9 cm x 0.6 cm x 1.1 cm and 1.8 cm x 1.4 cm x 1.6 cm left renal cysts are noted. There is mild left-sided hydronephrosis. Bladder: A Foley catheter is in place. Large amount of air is seen within the bladder lumen. Other: None. IMPRESSION: 1. Bilateral echogenic kidneys which may represent sequelae associated with medical renal disease. 2. Small left renal cyst. 3. Bilateral hydronephrosis, right greater than left. 4. Large amount of air within the bladder lumen. Electronically Signed   By: Suzen Dials M.D.   On: 09/18/2023 20:55   CT ABDOMEN PELVIS WO CONTRAST Result Date: 09/16/2023 CLINICAL DATA:  Chest and abdominal pain EXAM: CT ABDOMEN AND PELVIS WITHOUT CONTRAST TECHNIQUE: Multidetector CT imaging of the abdomen and pelvis was performed following the standard protocol without IV contrast. RADIATION DOSE REDUCTION: This exam was performed according to the departmental dose-optimization program which includes automated exposure control, adjustment of the mA and/or kV  according to patient size and/or use of iterative reconstruction technique. COMPARISON:  08/16/2023 FINDINGS: Lower chest: Mild scarring is noted in the right lung base. Hepatobiliary: Cholelithiasis is seen without complicating factors. The liver appears within normal limits. Pancreas: Scattered calcifications are noted throughout the pancreas consistent with chronic pancreatitis. The previously seen cystic change adjacent to the head of the pancreas and second portion of the duodenum is again noted and stable measuring approximately 2 cm. The more peripheral pancreas appears within normal limits. Spleen: Normal in size without focal abnormality. Adrenals/Urinary Tract: Adrenal glands are within normal limits. Persistent hydronephrosis and hydroureter is noted bilateral likely related to an over distended bladder. Foley catheter is noted in place there is considerable mixed attenuation density within the bladder lumen consistent with air, urine and likely thrombus. Persistent bladder wall thickening is again noted with evidence of a superior bladder diverticulum. These changes likely contribute to the degree of hydronephrosis. Stomach/Bowel: Scattered fecal material is noted throughout the colon. No obstructive or inflammatory changes of the colon are seen. The appendix is partially visualized and within normal limits. Small bowel and stomach appear within normal limits. Vascular/Lymphatic: Aortic atherosclerosis. No enlarged abdominal or pelvic lymph nodes. Previously seen lymph node adjacent to the rectum is no longer identified. Reproductive: Prostate is unremarkable. Other: No abdominal wall hernia or abnormality. No abdominopelvic ascites. Musculoskeletal: Bilateral L5 pars defects are noted without significant anterolisthesis. No acute bony abnormality is noted. 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 Mixed echogenicity material is noted within the bladder representing  air, urine and likely hyperdense thrombus. Correlate with Foley output. The over distention of the bladder contributes to the bilateral hydronephrosis which is similar to that seen on the prior exam. Diffuse bladder wall thickening is noted suspicious for underlying neoplasm. Correlate with clinical history. Cholelithiasis without complicating factors. Chronic pancreatitis with a 2 cm pancreatic pseudocyst in the region of the head stable from the prior study. Electronically Signed   By: Oneil Devonshire M.D.   On: 09/16/2023 20:25   DG Chest 1 View Result Date: 09/16/2023 CLINICAL DATA:  Chest pain. EXAM: CHEST  1 VIEW COMPARISON:  July 29, 2023 FINDINGS: The heart size and mediastinal contours are within normal limits. Mild, stable linear scarring and/or atelectasis is seen along the periphery of the right lung base. No acute infiltrate, pleural effusion or pneumothorax is identified. The visualized skeletal structures are unremarkable. IMPRESSION: Mild, stable right basilar linear scarring and/or atelectasis. Electronically Signed  By: Suzen Dials M.D.   On: 09/16/2023 14:51   CT Chest Wo Contrast Result Date: 09/05/2023 CLINICAL DATA:  Pancreatic cyst. EXAM: CT CHEST WITHOUT CONTRAST TECHNIQUE: Multidetector CT imaging of the chest was performed following the standard protocol without IV contrast. RADIATION DOSE REDUCTION: This exam was performed according to the departmental dose-optimization program which includes automated exposure control, adjustment of the mA and/or kV according to patient size and/or use of iterative reconstruction technique. COMPARISON:  04/20/2019. FINDINGS: Cardiovascular: Ascending thoracic aorta aneurysm maximal at a diameter 4.1 cm. Ectatic aorta at the arch to 3.5 cm. Atheromatous changes with calcifications of aorta and coronary arteries. 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 Mediastinum/Nodes: No enlarged mediastinal or axillary lymph  nodes. Thyroid gland, trachea, and esophagus demonstrate no significant findings. Lungs/Pleura: Subsegmental atelectasis or scarring in the right middle and lower lobes laterally. Right middle lobe nodule measuring 4 mm is unchanged. No pneumonia or pulmonary edema. No pneumothorax or pleural effusion. Upper Abdomen: Calcifications of partially imaged pancreas consistent with chronic pancreatitis. Right-sided hydronephrosis; right kidney was only partially imaged. Musculoskeletal: No chest wall mass or suspicious bone lesions identified. IMPRESSION: 1. Right middle lobe nodule stable for several years consistent with a benign process. 2. Ascending thoracic aortic aneurysm, 4.1 cm. 3. Chronic pancreatitis. 4. Right-sided hydronephrosis. Recommend dedicated imaging of the kidneys. 5. Aortic atherosclerosis (ICD10-I70.0). Electronically Signed   By: Fonda Field M.D.   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  Not candidate for radical resection. His prognosis is poor due to low performance status, severe malnutrition, multiple comorbidities.  He is not candidate for aggressive chemotherapy treatments. I called both power of attorney as well as patient's sister Jay Flores.  POA and sister want to try cancer treatments.  I had a lengthy discussion with both of them that with patient's current condition, there is a good chance that he may not tolerate treatments or may be readmitted prior or during treatments. Discussed about alternative option of hospice/comfort care. POA and sister desires treatments, They want to see how patient does outpatient and follow-up with me at the cancer center for reevaluation. CODE STATUS DNR/DNI Palliative care service on board  # AKI on CKD, due to urinary tract obstruction creatinine has improved Family elected not to proceed with nephrostomy tubes after weaning benefits and risks.  # Normocytic  anemia, due to blood loss, anemia due to chronic kidney disease.  He has had IV Venofer  treatments with no significant improvement.  Recommend PRBC transfusion to keep hemoglobin above 7.    # Severe malnutrition-nutrition supplements.  Thank you for allowing me to participate in the care of this patient.   Zelphia Cap, MD, PhD Hematology Oncology 09/24/2023

## 2023-09-24 NOTE — Progress Notes (Signed)
 PROGRESS NOTE Jay Flores    DOB: 1961/01/17, 63 y.o.  FMW:[id number]    Code Status: Full Code   DOA: 09/16/2023   LOS: 8  Brief hospital course  Jay Flores is a 63 y.o. male, group home resident,  with medical history significant of hypertension, hyperlipidemia, diabetes mellitus, COPD, CKD-3B, HIV (CD 258 on 03/29/17 and VL 70 on 04/25/23), HIV dementia HCV (s/p of Harvoni), thrombocytopenia, prostate cancer, indwelling Foley catheter placement, iron  deficiency anemia, DVT/PE on Eliquis , who presents to ED  08/11 with lower abdominal pain and hematuria. 08/11: to ED, CT (+)large distended bladder with superior bladder wall diverticulum, progressive or worsening bilateral hydroureteronephrosis. WBC 22.8, worsening renal function, troponin 23 --> 24, lactic acid of 5.4 --> 5.3. Urology consult - 53 French catheter was removed and exchanged for a 22 [place] three-way Foley catheter, irrigated with about a liter and a half of saline and evacuated significant amount of clot, placed on CBI with symptoms improved.   09/24/23 -Abdominal pain is resolved. Received 2nd u pRBC total and hgb stable. Patient tolerated cystoscopy well. Biopsy results pending. Oncology consulted to follow for results. Pending determination of medical decision maker to proceed with recommended nephrostomy tubes or not. Appreciate palliative and urology following.   Assessment & Plan  Principal Problem:   Acute kidney injury superimposed on stage 3b chronic kidney disease (HCC) Active Problems:   Bilateral hydronephrosis   Urinary retention   Bladder wall thickening   DVT (deep venous thrombosis)_left leg   Pulmonary embolism (HCC)   CAD (coronary artery disease)   HTN (hypertension)   Type II diabetes mellitus with renal manifestations (HCC)   HLD (hyperlipidemia)   HIV (human immunodeficiency virus infection) (HCC)   Elevated lactic acid level   Anemia in chronic kidney disease (CKD)   Tobacco use    Protein-calorie malnutrition, severe   AKI (acute kidney injury) (HCC)   Hematuria  AKI superimposed on stage 3b CKD with bilateral hydronephrosis due to acute urinary retention- chronic foley obstructed with blood. Renal US  positive for: Bilateral echogenic kidneys which may represent sequelae associated with medical renal disease. Small left renal cyst, Bilateral hydronephrosis, right greater than left. Baseline cr 2.62. Since admission has trended 7.03>>> 3.10 s/p foley replacement.  - urology following.  - continue foley  - cystoscopy with clot evacuation 8/14. Nephrostomy tubes were recommended but at this time are not pursuing further invasive treatments.   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 Family meeting 8/19 and at this time patient is changed to DNR. He has poor prognosis but is stable. They will explore treatment options with oncology and understand that chemotherapy is not an option due to renal function - palliative, urology, oncology consulted.   UTI- UxCx positive for klebsiella pneumoniae and e faecalis.  - continue IV Abx, Cefazolin  changed to unasyn     Hematuria- CT shows large clot within the bladder causing obstruction, diffuse bladder wall thickening Hold Eliquis , consider restart when hgb stable and no further procedures planned by urology  Continuous bladder irrigation Urology following Continue foley   ABLA d/t hematuria see above Anemia chronic disease- hgb goal 8 or greater.  Treat underlying cause as above  Hold eliquis   Transfused 2nd u pRBCs 8/13, hgb remains stable at 8.8>8.1 today completed venofer  per hematology recs   Prostate cancer status post external beam radiation, completed in June 2025.  Chronic urinary retention managed with a Foley catheter.  first noted to be in urinary retention in August 2022. He has his catheter exchanged every 4 weeks in the urology clinic.   Continue Proscar  and Flomax     DVT left leg and pulmonary embolism-  not acutely exacerbated Temporarily hold Eliquis  SCDs   CAD  HLD Troponin 23 --> 26, possibly due to demand ischemia. Continue Lipitor   HTN Hold Cozaar  due to worsening renal function. BP has maintained WNL   Type II diabetes mellitus with renal manifestations [facility]): Recent A1c 6.3, well-controlled.   Patient is taking Jardiance and metformin SSI   HIV (human immunodeficiency virus infection) (HCC) Continue Juluca     Tobacco use Nicotine  patch  Body mass index is 15.96 kg/m.  VTE ppx: Place and maintain sequential compression device Start: 09/17/23 1256 SCDs Start: 09/16/23 2126  Diet:     Diet   Diet regular Room service appropriate? Yes; Fluid consistency: Thin   Consultants: Urology  Oncology  Palliative   Subjective 09/24/23    Pt reports feeling well. Denies dysuria, pelvic pain. No concerns today.    Objective  Blood pressure 136/83, pulse 73, temperature (!) 97.3 F (36.3 C), resp. rate 18, height 5' 8 (1.727 m), weight 47.6 kg, SpO2 100%.  Intake/Output Summary (Last 24 hours) at 09/24/2023 0740 Last data filed at 09/24/2023 0450 Gross per 24 hour  Intake 360 ml  Output 4400 ml  Net -4040 ml   Filed Weights   09/16/23 1345 09/19/23 1312  Weight: 47.6 kg 47.6 kg     Physical Exam:  General: awake, alert, NAD, frail Respiratory: normal respiratory effort. Gastrointestinal: soft, NT, ND Nervous: A&O x3. no gross focal neurologic deficits, normal speech Extremities: moves all equally, no edema, normal tone Skin: dry, intact, normal temperature, normal color. No rashes, lesions or ulcers on exposed skin Psychiatry: normal mood, congruent affect  Labs   I have personally reviewed the following labs and imaging studies CBC    Component Value Date/Time   WBC 10.7 (H) 09/22/2023 0428   RBC 3.05 (L) 09/22/2023 0428   HGB 8.3 (L) 09/22/2023 0428   HGB 9.3 (L) 05/30/2023  1025   HGB 8.2 (L) 04/25/2023 0709   HCT 26.9 (L) 09/22/2023 0428   HCT 24.7 (L) 04/25/2023 0709   PLT 188 09/22/2023 0428   PLT 174 05/30/2023 1025   PLT 128 (L) 04/25/2023 0709   MCV 88.2 09/22/2023 0428   MCV 96 04/25/2023 0709   MCH 27.2 09/22/2023 0428   MCHC 30.9 09/22/2023 0428   RDW 15.9 (H) 09/22/2023 0428   RDW 13.9 04/25/2023 0709   LYMPHSABS 1.3 08/22/2023 1533   LYMPHSABS 1.7 04/25/2023 0709   MONOABS 0.3 08/22/2023 1533   EOSABS 0.1 08/22/2023 1533   EOSABS 0.1 04/25/2023 0709   BASOSABS 0.0 08/22/2023 1533   BASOSABS 0.0 04/25/2023 0709      Latest Ref Rng & Units 09/22/2023    4:28 AM 09/21/2023    5:46 AM 09/20/2023    6:18 AM  BMP  Glucose 70 - 99 mg/dL 856  842  820   BUN 8 - 23 mg/dL 52  54  53   Creatinine 0.61 - 1.24 mg/dL 6.89  6.58  6.10   Sodium 135 - 145 mmol/L 137  136  140   Potassium 3.5 - 5.1 mmol/L 4.5  4.1  4.8   Chloride 98 - 111 mmol/L 104  105  107   CO2 22 - 32 mmol/L 23  24  23  Calcium  8.9 - 10.3 mg/dL 8.7  8.9  8.9     No results found.   Disposition Plan & Communication  Patient status: Inpatient  Admitted From: Home Planned disposition location: Home Anticipated discharge date: 8/20 pending hgb stability, urology/palliative/oncology workup  Family Communication: sisters with palliative family meeting   Author: Marien LITTIE Piety, DO [facility] 09/24/2023, 7:40 AM   Available by Epic secure chat 7AM-7PM. If 7PM-7AM, please contact night-coverage.  [facility] contact information found on [email or web address].

## 2023-09-24 NOTE — Plan of Care (Signed)
  Problem: Coping: Goal: Ability to adjust to condition or change in health will improve Outcome: Progressing   Problem: Metabolic: Goal: Ability to maintain appropriate glucose levels will improve Outcome: Progressing   Problem: Education: Goal: Knowledge of General Education information will improve Description: Including pain rating scale, medication(s)/side effects and non-pharmacologic comfort measures Outcome: Progressing   Problem: Activity: Goal: Risk for activity intolerance will decrease Outcome: Progressing

## 2023-09-24 NOTE — Progress Notes (Signed)
 Daily Progress Note   Patient Name: Jay Flores       Date: 09/24/2023 DOB: 1960-03-16  Age: 63 y.o. MRN#: [id number] Attending Physician: Lenon Marien CROME, MD Primary Care Physician: [name], MD Admit Date: 09/16/2023  Reason for Consultation/Follow-up: Establishing goals of care  Subjective: Jay Flores is a 63 y.o. male with PMH HIV, prostate cancer s/p IMRT on 6 months of ADT, BPH with urinary retention on finasteride  managed with chronic Foley catheter, and recent history of intermittent gross hematuria now admitted from a group home on 09/16/2023 with gross hematuria with clot retention and AKI with bilateral hydronephrosis. S/p cystoscopy 09/19/23 for clot evacuation. Biopsy taken with suspicion for malignancy on inspection.   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    He is currently being treated for UTI, AKI on CKD, improving.   A review of his e-chart revealed 4 inpatient stays within the last 6 months.   Patient was seen at bedside today without any family members present. He is alert and not experiencing any acute distress. The patient is able to express his needs but is unable to participate in meaningful conversation. He reports no pain or discomfort and is not exhibiting any distressing symptoms. When asked about his understanding of his current medical condition, he was unable to provide significant insight. A family meeting to discuss goals of care with his two sisters (Mable and Nichole) and Lennette Dade (the group home owner where the patient resides) is scheduled for 1 PM today.  Length of Stay: 8  Current Medications: Scheduled Meds:    atorvastatin   20 mg Oral QHS   Chlorhexidine  Gluconate Cloth  6 each Topical Daily   cholecalciferol   1,000 Units Oral QHS   dolutegravir -rilpivirine   1 tablet Oral Daily   feeding supplement  237 mL Oral BID BM   finasteride   5 mg Oral Daily   folic acid   1 mg Oral Daily   insulin  aspart  0-9 Units Subcutaneous TID WC   metoprolol  succinate  100 mg Oral Daily   multivitamin with minerals  1 tablet Oral Daily   nicotine   21 mg Transdermal Daily   sodium bicarbonate   650 mg Oral BID   tamsulosin   0.4 mg Oral QPC supper  Vilazodone  HCl  20 mg Oral Daily    Continuous Infusions:  ampicillin -sulbactam (UNASYN ) IV 3 g (09/24/23 1216)   sodium chloride  irrigation      PRN Meds: acetaminophen , albuterol , dextromethorphan-guaiFENesin , haloperidol  lactate, loperamide , ondansetron  (ZOFRAN ) IV, oxyCODONE , QUEtiapine   Physical Exam Vitals and nursing note reviewed.  Constitutional:      Appearance: He is ill-appearing.  HENT:     Head: Normocephalic and atraumatic.  Cardiovascular:     Rate and Rhythm: Normal rate.  Pulmonary:     Effort: Pulmonary effort is normal.  Genitourinary:    Comments: Foley catheter in situ, clear urine draining well to urobag per gravity.  Musculoskeletal:     Comments: Generalized weakness  Skin:    General: Skin is warm and dry.  Neurological:     General: No focal deficit present.  Psychiatric:     Comments: Baseline dementia             Vital Signs: BP (!) 153/86 (BP Location: Left Arm)   Pulse 78   Temp 98.5 F (36.9 C)   Resp 16   Ht 5' 8 (1.727 m)   Wt 47.6 kg   SpO2 100%   BMI 15.96 kg/m  SpO2: SpO2: 100 % O2 Device: O2 Device: Room Air O2 Flow Rate: O2 Flow Rate (L/min): 6 L/min  Intake/output summary:  Intake/Output Summary (Last 24 hours) at 09/24/2023 1219 Last data filed at 09/24/2023 0900 Gross per 24 hour  Intake 360 ml  Output 4400 ml  Net -4040 ml   LBM: Last BM Date : 09/24/23 Baseline Weight: Weight: 47.6  kg Most recent weight: Weight: 47.6 kg       Palliative Assessment/Data: 50%      Patient Active Problem List   Diagnosis Date Noted   Hematuria 09/20/2023   Acute kidney injury superimposed on stage 3b chronic kidney disease (HCC) 09/16/2023   Bilateral hydronephrosis 09/16/2023   Urinary retention 09/16/2023   CAD (coronary artery disease) 09/16/2023   DVT (deep venous thrombosis)_left leg 09/16/2023   Pulmonary embolism (HCC) 09/16/2023   Iron  deficiency anemia 09/16/2023   Bladder wall thickening 09/16/2023   Bladder mass 08/22/2023   Protein-calorie malnutrition, severe 08/01/2023   NSTEMI (non-ST elevated myocardial infarction) (HCC) 07/29/2023   Diabetes mellitus type 2, noninsulin dependent (HCC) 07/29/2023   CKD (chronic kidney disease) stage 4, GFR 15-29 ml/min (HCC) 07/29/2023   Leukocytosis 07/29/2023   Right pulmonary infiltrate on CXR 07/29/2023   Community acquired pneumonia 07/06/2023   Chest discomfort 07/06/2023   Depression 07/06/2023   Anemia in chronic kidney disease (CKD) 04/24/2023   HLD (hyperlipidemia) 04/24/2023   Syncope 04/24/2023   Type II diabetes mellitus with renal manifestations (HCC) 04/24/2023   Prostate cancer (HCC) 04/24/2023   Elevated lactic acid level 04/24/2023   Pancreatic cyst 03/23/2022   Callus 11/23/2021   Pain due to onychomycosis of toenails of both feet 06/15/2021   Diabetic neuropathy (HCC) 06/15/2021   Bladder dysfunction 05/07/2021   H/O urinary retention 05/07/2021   Tobacco use 04/17/2018   DKA (diabetic ketoacidosis) (HCC) 03/29/2017   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   Leg weakness, bilateral 01/18/2016   Difficulty walking 12/19/2015   Hyperreflexia of lower extremity 12/19/2015   Muscle spasm of both lower legs 12/19/2015   HTN (hypertension) 02/19/2012   Weakness 02/19/2012   Metabolic acidosis 01/27/2012   Hypokalemia 01/23/2012   PNA (pneumonia) 01/22/2012   HIV (human immunodeficiency virus infection)  (HCC) 01/22/2012   Brain lesion 01/22/2012  AKI (acute kidney injury) (HCC) 01/22/2012   Anemia 01/22/2012   Altered mental status 01/22/2012   Hepatitis C infection 10/05/2011    Palliative Care Assessment & Plan   Patient Profile:  Jay Flores is a 63 y.o. male with PMH HIV, prostate cancer s/p IMRT on 6 months of ADT, BPH with urinary retention on finasteride  managed with chronic Foley catheter, and recent history of intermittent gross hematuria now admitted from a group home on 09/16/2023 with gross hematuria with clot retention and AKI with bilateral hydronephrosis. S/p cystoscopy 09/19/23 for clot evacuation. Biopsy taken with suspicion for malignancy on inspection.    Assessment:  Patient was seen at bedside today without any family members present. He is alert and not experiencing any acute distress. The patient is able to express his needs but is unable to participate in meaningful conversation. He reports no pain or discomfort and is not exhibiting any distressing symptoms. When asked about his understanding of his current medical condition, he was unable to provide reasonable/significant insight. A family meeting to discuss goals of care with his two sisters (Mable and Nichole) and Lennette Dade (the group home owner where the patient resides) is scheduled for 1 PM today.  Update: Noted recent RN note that patient has started attempting to pull lines and getting out of bed. He is redirectable but 1:1 Recruitment consultant initiated.   A telephonic family meeting was conducted to discuss goals of care. Present on the call were the patient's two sisters, Mable and Nichole, as well as [name] (group home owner), per the request of the patient's sisters. The discussion included a review of the patient's current condition and overall disease burden. It was explained that recent pathology from biopsy revealed a non-favorable result: high-grade urothelial carcinoma with squamous cell differentiation,  invading the bladder muscle. This represents an aggressive, muscle-invasive bladder malignancy.   Given the patient's overall health status--including immunocompromise and multiple chronic conditions such as CKD--treatment options are limited. However, the plan based on family's desire is to continue exploring potential interventions based on Oncology recommendations. The family expressed a desire to pursue treatable options and consider oncology-directed interventions, but also acknowledged that if these are unsuccessful, the focus may shift to comfort measures.  The initial plan for bilateral nephrostomy tube placement, along with associated risks and benefits, was discussed. The family elected not to proceed with nephrostomy tube placement due to safety concerns related to the patient's cognitive status and recent improvement of creatinine level. The conversation also addressed the differences between comfort measures/hospice care and aggressive treatment, particularly in terms of impact on the patient's quality of life. Additionally, the patient's code status was reviewed. After considering the disease burden, risks, benefits, and likely outcomes, all parties agreed to designate the patient as DNR and DNI.   Recommendations/Plan:  Code status updated from full code to DNR/DNI. Continue to treat the treatable, provide appropriate medical interventions, e.g., antibiotics for UTI. Symptom management as per attending: Oxycodone  5 mg PO every 6 hours PRN for moderate pain Acetaminophen  (Tylenol ) 650 mg PO every 6 hours PRN for mild pain Seroquel  50 mg PO BID PRN for agitation and insomnia Haldol  1 mg IV every 6 hours PRN for agitation Follow up with oncology; family remains open to exploring viable treatment options as recommended by oncology. Arrange outpatient oncology palliative care follow-up for ongoing goals of care discussions. Plan for discharge back to group home; Lawanda (group home owner)  confirmed their ability to provide continued assistance and  that PT services can be accommodated at the group home. Continue to provide holistic palliative support.  Code Status: DNR-Limited.     Code Status Orders  (From admission, onward)           Start     Ordered   09/16/23 2128  Full code  Continuous       Question:  By:  Answer:  Consent: discussion documented in EHR   09/16/23 2128           Code Status History     Date Active Date Inactive Code Status Order ID Comments User Context   07/29/2023 1302 08/03/2023 1908 Full Code [id number]  Sherre Greig SAILOR, DO ED   07/06/2023 1312 07/08/2023 2038 Full Code [id number]  Sherre Greig SAILOR, DO ED   04/24/2023 2246 04/26/2023 1655 Full Code [id number]  Hilma Rankins, MD ED   03/29/2017 1705 04/01/2017 2026 Full Code [id number]  Lonzo Pollen, MD Inpatient   01/22/2012 2118 01/29/2012 1608 Full Code [id number]  Alm Romance, MD Inpatient      Advance Directive Documentation    Flowsheet Row Most Recent Value  Type of Advance Directive Healthcare Power of Attorney  Pre-existing out of facility DNR order (yellow form or pink MOST form) --  MOST Form in Place? 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  Discharge Planning: When deemed stable and safe for discharge, patient will be anticipating to return to group home with PT services, follow-up with outpatient oncology and palliative care.  Care plan was discussed with treatment team, and family members.   Total time spent: 65 minutes   Thank you for allowing the Palliative Medicine Team to assist in the care of this patient.    Kathlyne JULIANNA Tracie Mickey, NP  Please contact Palliative Medicine Team phone at [phone]  for questions and concerns.

## 2023-09-24 NOTE — Progress Notes (Signed)
 Met with patient at bedside. Patient has been pulling at medical devices and attempting to exit bed. Patient verbalizes understanding of need to leave lines in place. Assisted bedside staff to change patient's linen and cleansed from incont episode.  Patient with 1:1 sitter. Does appear to be re-directable at this time.

## 2023-09-24 NOTE — Progress Notes (Signed)
 Occupational Therapy Treatment Patient Details Name: Jay Flores MRN: [id number] DOB: April 10, 1960 Today's Date: 09/24/2023   History of present illness Pt is a 63 y.o. male, group home resident,  with medical history significant of hypertension, hyperlipidemia, diabetes mellitus, COPD, CKD-3B, HIV (CD 258 on 03/29/17 and VL 70 on 04/25/23), HIV dementia HCV (s/p of Harvoni), thrombocytopenia, prostate cancer, indwelling Foley catheter placement, iron  deficiency anemia, DVT/PE on Eliquis , who presents to ED  08/11 with lower abdominal pain and hematuria. S/p cystoscopy with trans urethral resection of bladder tumor on 8/15.   OT comments  Jay Flores was seen for OT treatment on this date. Upon arrival to room pt semi-supine in flat bed, safety sitter present. Pt initially agreeable to OT Tx session, but begins to refuse all activities offered a few minutes into session. OT facilitated ADL management with encouragement, education and assistance as described below. See ADL section for additional details regarding occupational performance. Pt continues to be functionally limited by cognition, generalized weakness, decreased activity tolerance and pain Pt return verbalizes understanding of education provided t/o session. No progress toward goals appreciated this date. Will re-assess next session. Pt continues to benefit from skilled OT services to maximize return to PLOF and minimize risk of future falls, injury, and readmission. Will continue to follow POC as written. Discharge recommendation remains appropriate.        If plan is discharge home, recommend the following:  A lot of help with bathing/dressing/bathroom;A lot of help with walking and/or transfers;Direct supervision/assist for financial management;Assist for transportation;Assistance with cooking/housework;Help with stairs or ramp for entrance;Supervision due to cognitive status;Direct supervision/assist for medications management    Equipment Recommendations  BSC/3in1;Wheelchair (measurements OT)    Recommendations for Other Services      Precautions / Restrictions Precautions Precautions: Fall Recall of Precautions/Restrictions: Impaired Restrictions Weight Bearing Restrictions Per Provider Order: No       Mobility Bed Mobility Overal bed mobility: Needs Assistance             General bed mobility comments: Adamantly declines OOB/EOB activity. Is able to bring legs toward EOB with step by step cues and sits himself upright with bed flat without physical assist.    Transfers                         Balance Overall balance assessment: Needs assistance                                         ADL either performed or assessed with clinical judgement   ADL Overall ADL's : Needs assistance/impaired                                       General ADL Comments: Pt with increased confusion this date. Now has safety sitter in room 2/2 attempting to get OOB. When OT arrives, pt declines OOB activity. Is able to come upright sitting position in flatted bed with legs crossed at ankles. Pt adamantly declines activity despite education on importance of mobility/functional activity during hospital stay. OT attempts to facilitate UB grooming task, pt briefly engages but quickly declines further activity. Extended time taken to assist pt with positive approach cuing and support for ADL management.    Extremity/Trunk Assessment Upper Extremity Assessment Upper Extremity  Assessment: Generalized weakness   Lower Extremity Assessment Lower Extremity Assessment: Generalized weakness        Vision Patient Visual Report: No change from baseline     Perception     Praxis     Communication Communication Communication: No apparent difficulties   Cognition Arousal: Alert Behavior During Therapy: Flat affect Cognition: No family/caregiver present to determine baseline,  History of cognitive impairments, Cognition impaired   Orientation impairments: Place, Time, Situation Awareness: Intellectual awareness intact, Online awareness impaired Memory impairment (select all impairments): Working [employer] fast streamer, Copywriter, advertising, Engineer, structural memory Attention impairment (select first level of impairment): Sustained attention, Selective attention, Alternating attention, Divided attention Executive functioning impairment (select all impairments): Sequencing, Problem solving, Initiation, Organization, Reasoning OT - Cognition Comments: Generally less communicative today, answers no to most questions even when not appropriate. Requires consistent encouragement.                 Following commands: Impaired Following commands impaired: Follows one step commands inconsistently      Cueing   Cueing Techniques: Verbal cues, Tactile cues, Visual cues  Exercises Other Exercises Other Exercises: OT facilitated ADL management with edu and assist as described above.    Shoulder Instructions       General Comments      Pertinent Vitals/ Pain       Pain Assessment Pain Assessment: Faces Faces Pain Scale: Hurts a little bit Pain Location: bilateral knees Pain Descriptors / Indicators: Sore Pain Intervention(s): Limited activity within patient's tolerance, Monitored during session, Repositioned  Home Living                                          Prior Functioning/Environment              Frequency  Min 2X/week        Progress Toward Goals  OT Goals(current goals can now be found in the care plan section)  Progress towards OT goals: Not progressing toward goals - comment;OT to reassess next treatment  Acute Rehab OT Goals Patient Stated Goal: to feel better OT Goal Formulation: With patient Time For Goal Achievement: 10/05/23 Potential to Achieve Goals: Good ADL Goals Pt Will Perform Grooming:  sitting;with set-up;with min assist  Plan      Co-evaluation                 AM-PAC OT 6 Clicks Daily Activity     Outcome Measure   Help from another person eating meals?: None Help from another person taking care of personal grooming?: A Little Help from another person toileting, which includes using toliet, bedpan, or urinal?: A Lot Help from another person bathing (including washing, rinsing, drying)?: A Lot Help from another person to put on and taking off regular upper body clothing?: A Little Help from another person to put on and taking off regular lower body clothing?: A Lot 6 Click Score: 16    End of Session Equipment Utilized During Treatment: Gait belt;Rolling walker (2 wheels)  OT Visit Diagnosis: Other abnormalities of gait and mobility (R26.89);Muscle weakness (generalized) (M62.81)   Activity Tolerance Other (comment);Treatment limited secondary to agitation (limited by cognition)   Patient Left in bed;with call bell/phone within reach;with bed alarm set;with nursing/sitter in room   Nurse Communication          Time: 1351-1405 OT Time Calculation (min): 14 min  Charges: OT General Charges $OT Visit: 1 Visit OT Treatments $Self Care/Home Management : 8-22 mins  Jhonny Pelton, M.S., OTR/L 09/24/23, 3:18 PM

## 2023-09-24 NOTE — Progress Notes (Signed)
 Nutrition Follow-up  DOCUMENTATION CODES:   Severe malnutrition in context of chronic illness, Underweight  INTERVENTION:   -D/c Ensure Plus High Protein po BID, each supplement provides 350 kcal and 20 grams of protein  -Continue MVI with minerals daily -Continue regular diet -Magic cup TID with meals, each supplement provides 290 kcal and 9 grams of protein   NUTRITION DIAGNOSIS:   Severe Malnutrition related to chronic illness (HIV, dementia) as evidenced by moderate fat depletion, severe fat depletion, moderate muscle depletion, severe muscle depletion, percent weight loss.  Ongoing  GOAL:   Patient will meet greater than or equal to 90% of their needs  Progressing   MONITOR:   PO intake, Supplement acceptance, Diet advancement  REASON FOR ASSESSMENT:   Consult Assessment of nutrition requirement/status  ASSESSMENT:   Pt with medical history significant of hypertension, hyperlipidemia, diabetes mellitus, COPD, CKD-3B, HIV (CD 258 on 03/29/17 and VL 70 on 04/25/23), HIV dementia HCV (s/p of Harvoni), thrombocytopenia, prostate cancer, indwelling Foley catheter placement, iron  deficiency anemia, DVT/PE on Eliquis , who presents with lower abdominal pain and hematuria.  8/14- s/p cystoscopy, transurethral resection of bladder tumor  Reviewed I/Os: -4 L x 24 hours and -32.1 L since admission  UOP: 4.4 L x 24 hours  Per urology notes, intraoperative findings include multiple bladder tumors; biopsies taken. 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   Spoke with pt at bedside, who is much more alert and interactive in comparison to previous visit. Pt smiled at RD and stated he has not eaten anything today and is hungry. Pt requesting two cheeseburgers, french fries, and a coke. RD reoriented pt yo place, time, and situation. RD reviewed breakfast menu with pt and assisted in placing breakfast  order.   Pt currently on a regular diet. Noted variable oral intake. Noted meal completions 25-80%. He has been refusing Ensure supplements.   Discussed importance of good meal and supplement intake to promote healing.   Wt has been stable since admission.   Palliative care following for goals of care discussions. Plan for family meeting in a few days.   Medications reviewed and include vitamin D , and folic acid .   Labs reviewed: CBGS: 84-269 (inpatient orders for glycemic control are 0-9 units insulin  aspart TID with meals).    Diet Order:   Diet Order             Diet regular Room service appropriate? Yes; Fluid consistency: Thin  Diet effective now                   EDUCATION NEEDS:   Not appropriate for education at this time  Skin:  Skin Assessment: Skin Integrity Issues: Skin Integrity Issues:: Incisions Incisions: closed surgical incision to penis Other: wound to mid coccyx  Last BM:  09/23/23 (type 6)  Height:   Ht Readings from Last 1 Encounters:  09/19/23 5' 8 (1.727 m)    Weight:   Wt Readings from Last 1 Encounters:  09/19/23 47.6 kg    Ideal Body Weight:  70 kg  BMI:  Body mass index is 15.96 kg/m.  Estimated Nutritional Needs:   Kcal:  1900-2100  Protein:  105-120 grams  Fluid:  1.9-2.1 L    Margery ORN, RD, LDN, CDCES Registered Dietitian III Certified Diabetes Care and Education Specialist If unable to reach this RD, please use RD Inpatient group chat on secure chat between hours of 8am-4 pm daily

## 2023-09-25 ENCOUNTER — Ambulatory Visit: Admitting: Physician Assistant

## 2023-09-25 DIAGNOSIS — Z515 Encounter for palliative care: Secondary | ICD-10-CM | POA: Diagnosis not present

## 2023-09-25 DIAGNOSIS — Z7189 Other specified counseling: Secondary | ICD-10-CM | POA: Diagnosis not present

## 2023-09-25 DIAGNOSIS — N1832 Chronic kidney disease, stage 3b: Secondary | ICD-10-CM | POA: Diagnosis not present

## 2023-09-25 DIAGNOSIS — N179 Acute kidney failure, unspecified: Secondary | ICD-10-CM | POA: Diagnosis not present

## 2023-09-25 LAB — GLUCOSE, CAPILLARY
Glucose-Capillary: 137 mg/dL — ABNORMAL HIGH (ref 70–99)
Glucose-Capillary: 249 mg/dL — ABNORMAL HIGH (ref 70–99)
Glucose-Capillary: 148 mg/dL — ABNORMAL HIGH (ref 70–99)
Glucose-Capillary: 180 mg/dL — ABNORMAL HIGH (ref 70–99)

## 2023-09-25 NOTE — TOC Progression Note (Signed)
 Transition of Care [facility]) - Progression Note    Patient Details  Name: Jay Flores MRN: [id number] Date of Birth: 09/13/1960  Transition of [facility] CM/SW Contact  Dalia GORMAN Fuse, RN Phone Number: 09/25/2023, 10:40 AM  Clinical Narrative:    Patient is s/p 2 units PRBC and H&H is stable. Awaiting biopsy results. Plan to return to [facility] when he is medically ready for discharge.     Expected Discharge Plan: Group Home Barriers to Discharge: Continued Medical Work up               Expected Discharge Plan and Services   Discharge Planning Services: CM Consult   Living arrangements for the past 2 months: Group Home                                       Social Drivers of Health (SDOH) Interventions SDOH Screenings   Food Insecurity: No Food Insecurity (09/17/2023)  Housing: Low Risk  (09/17/2023)  Transportation Needs: No Transportation Needs (09/17/2023)  Utilities: Not At Risk (09/17/2023)  Depression (PHQ2-9): Low Risk  (03/23/2022)  Tobacco Use: High Risk (09/16/2023)    Readmission Risk Interventions    07/31/2023    9:41 AM  Readmission Risk Prevention Plan  Transportation Screening Complete  Medication Review (RN Care Manager) Complete  PCP or Specialist appointment within 3-5 days of discharge Complete  SW Recovery Care/Counseling Consult Complete  Palliative Care Screening Not Applicable  Skilled Nursing Facility Not Applicable

## 2023-09-25 NOTE — Plan of Care (Signed)

## 2023-09-25 NOTE — Plan of Care (Signed)
 Patient is withdrawn and refusing care   Problem: Education: Goal: Ability to describe self-care measures that may prevent or decrease complications (Diabetes Survival Skills Education) will improve Outcome: Not Progressing Goal: Individualized Educational Video(s) Outcome: Not Progressing   Problem: Coping: Goal: Ability to adjust to condition or change in health will improve Outcome: Not Progressing   Problem: Fluid Volume: Goal: Ability to maintain a balanced intake and output will improve Outcome: Not Progressing   Problem: Health Behavior/Discharge Planning: Goal: Ability to identify and utilize available resources and services will improve Outcome: Not Progressing Goal: Ability to manage health-related needs will improve Outcome: Not Progressing   Problem: Metabolic: Goal: Ability to maintain appropriate glucose levels will improve Outcome: Not Progressing   Problem: Nutritional: Goal: Maintenance of adequate nutrition will improve Outcome: Not Progressing Goal: Progress toward achieving an optimal weight will improve Outcome: Not Progressing   Problem: Skin Integrity: Goal: Risk for impaired skin integrity will decrease Outcome: Not Progressing   Problem: Tissue Perfusion: Goal: Adequacy of tissue perfusion will improve Outcome: Not Progressing   Problem: Education: Goal: Knowledge of General Education information will improve Description: Including pain rating scale, medication(s)/side effects and non-pharmacologic comfort measures Outcome: Not Progressing   Problem: Health Behavior/Discharge Planning: Goal: Ability to manage health-related needs will improve Outcome: Not Progressing   Problem: Clinical Measurements: Goal: Ability to maintain clinical measurements within normal limits will improve Outcome: Not Progressing Goal: Will remain free from infection Outcome: Not Progressing Goal: Diagnostic test results will improve Outcome: Not  Progressing Goal: Respiratory complications will improve Outcome: Not Progressing Goal: Cardiovascular complication will be avoided Outcome: Not Progressing   Problem: Activity: Goal: Risk for activity intolerance will decrease Outcome: Not Progressing   Problem: Nutrition: Goal: Adequate nutrition will be maintained Outcome: Not Progressing   Problem: Coping: Goal: Level of anxiety will decrease Outcome: Not Progressing   Problem: Elimination: Goal: Will not experience complications related to bowel motility Outcome: Not Progressing Goal: Will not experience complications related to urinary retention Outcome: Not Progressing   Problem: Pain Managment: Goal: General experience of comfort will improve and/or be controlled Outcome: Not Progressing   Problem: Safety: Goal: Ability to remain free from injury will improve Outcome: Not Progressing   Problem: Skin Integrity: Goal: Risk for impaired skin integrity will decrease Outcome: Not Progressing

## 2023-09-25 NOTE — Progress Notes (Signed)
 Daily Progress Note   Patient Name: Jay Flores       Date: 09/25/2023 DOB: 1960-02-24  Age: 63 y.o. MRN#: [id number] Attending Physician: Trudy Anthony HERO, MD Primary Care Physician: [name], MD Admit Date: 09/16/2023  Reason for Consultation/Follow-up: Establishing goals of care  Subjective: Jay Flores is a 63 y.o. male with PMH HIV, prostate cancer s/p IMRT on 6 months of ADT, BPH with urinary retention on finasteride  managed with chronic Foley catheter, and recent history of intermittent gross hematuria now admitted from a group home on 09/16/2023 with gross hematuria with clot retention and AKI with bilateral hydronephrosis. S/p cystoscopy 09/19/23 for clot evacuation. Biopsy taken with suspicion for malignancy on inspection.   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    He is currently being treated for UTI, AKI on CKD, improving.  A review of his e-chart revealed 4 inpatient stays within the last 6 months.   Patient is being seen at bedside today, he is moved to a room closer to the nurses station for safety. Patient is awake, Ill-appearing but not in any form of acute distress. Seen patient lying in bed,  Less interactive and refused to further engage with conversation. When asked if he needs anything from us , he said no. Ended interaction with patient with assurance that we are here to help him get better.  No apparent acute concerns from the palliative stand point.   Length of Stay: 9  Current Medications: Scheduled Meds:   atorvastatin   20 mg Oral QHS   Chlorhexidine  Gluconate Cloth  6 each Topical Daily   cholecalciferol   1,000 Units Oral QHS    dolutegravir -rilpivirine   1 tablet Oral Daily   feeding supplement  237 mL Oral BID BM   finasteride   5 mg Oral Daily   folic acid   1 mg Oral Daily   insulin  aspart  0-9 Units Subcutaneous TID WC   metoprolol  succinate  100 mg Oral Daily   multivitamin with minerals  1 tablet Oral Daily   nicotine   21 mg Transdermal Daily   sodium bicarbonate   650 mg Oral BID   tamsulosin   0.4 mg Oral QPC supper   Vilazodone  HCl  20 mg Oral Daily    Continuous Infusions:  ampicillin -sulbactam (UNASYN ) IV 3 g (09/24/23 2300)   sodium chloride  irrigation      PRN Meds: acetaminophen , albuterol , dextromethorphan-guaiFENesin , haloperidol  lactate, loperamide , ondansetron  (ZOFRAN ) IV, oxyCODONE , QUEtiapine   Physical Exam Vitals and nursing note reviewed.  Constitutional:      Appearance: He is ill-appearing.  HENT:     Head: Normocephalic and atraumatic.  Cardiovascular:     Rate and Rhythm: Normal rate.  Pulmonary:     Effort: Pulmonary effort is normal.  Genitourinary:    Comments: Foley catheter in situ, clear urine draining well to urobag per gravity.  Musculoskeletal:     Comments: Generalized weakness  Skin:    General: Skin is warm and dry.  Neurological:     General: No focal deficit present.  Psychiatric:     Comments: Baseline dementia             Vital Signs: BP (!) 159/80 (BP Location: Left Arm)   Pulse 81   Temp 98 F (36.7 C)   Resp 18   Ht 5' 8 (1.727 m)   Wt 47.6 kg   SpO2 100%   BMI 15.96 kg/m  SpO2: SpO2: 100 % O2 Device: O2 Device: Room Air O2 Flow Rate: O2 Flow Rate (L/min): 6 L/min  Intake/output summary:  Intake/Output Summary (Last 24 hours) at 09/25/2023 1054 Last data filed at 09/25/2023 0900 Gross per 24 hour  Intake 440 ml  Output 1000 ml  Net -560 ml   LBM: Last BM Date : 09/24/23 Baseline Weight: Weight: 47.6 kg Most recent weight: Weight: 47.6 kg       Palliative Assessment/Data: 50%      Patient Active Problem List   Diagnosis Date  Noted   Hematuria 09/20/2023   Acute kidney injury superimposed on stage 3b chronic kidney disease (HCC) 09/16/2023   Bilateral hydronephrosis 09/16/2023   Urinary retention 09/16/2023   CAD (coronary artery disease) 09/16/2023   DVT (deep venous thrombosis)_left leg 09/16/2023   Pulmonary embolism (HCC) 09/16/2023   Iron  deficiency anemia 09/16/2023   Bladder wall thickening 09/16/2023   Bladder mass 08/22/2023   Protein-calorie malnutrition, severe 08/01/2023   NSTEMI (non-ST elevated myocardial infarction) (HCC) 07/29/2023   Diabetes mellitus type 2, noninsulin dependent (HCC) 07/29/2023   CKD (chronic kidney disease) stage 4, GFR 15-29 ml/min (HCC) 07/29/2023   Leukocytosis 07/29/2023   Right pulmonary infiltrate on CXR 07/29/2023   Community acquired pneumonia 07/06/2023   Chest discomfort 07/06/2023   Depression 07/06/2023   Anemia in chronic kidney disease (CKD) 04/24/2023   HLD (hyperlipidemia) 04/24/2023   Syncope 04/24/2023   Type II diabetes mellitus with renal manifestations (HCC) 04/24/2023   Prostate cancer (HCC) 04/24/2023   Elevated lactic acid level 04/24/2023   Pancreatic cyst 03/23/2022   Callus 11/23/2021   Pain due to onychomycosis of toenails of both feet 06/15/2021   Diabetic neuropathy (HCC) 06/15/2021   Bladder dysfunction 05/07/2021   H/O urinary retention 05/07/2021   Tobacco use 04/17/2018   DKA (diabetic ketoacidosis) (HCC) 03/29/2017   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   Leg weakness, bilateral 01/18/2016   Difficulty walking 12/19/2015   Hyperreflexia of lower extremity 12/19/2015   Muscle spasm of both lower legs 12/19/2015   HTN (hypertension) 02/19/2012   Weakness 02/19/2012   Metabolic acidosis 01/27/2012   Hypokalemia 01/23/2012   PNA (pneumonia) 01/22/2012   HIV (human immunodeficiency virus infection) (HCC) 01/22/2012   Brain lesion 01/22/2012   AKI (acute kidney injury) (HCC) 01/22/2012   Anemia 01/22/2012  Altered mental status  01/22/2012   Hepatitis C infection 10/05/2011    Palliative Care Assessment & Plan   Patient Profile:  Jay Flores is a 63 y.o. male with PMH HIV, prostate cancer s/p IMRT on 6 months of ADT, BPH with urinary retention on finasteride  managed with chronic Foley catheter, and recent history of intermittent gross hematuria now admitted from a group home on 09/16/2023 with gross hematuria with clot retention and AKI with bilateral hydronephrosis. S/p cystoscopy 09/19/23 for clot evacuation. Biopsy taken with suspicion for malignancy on inspection.    Assessment:  The patient was evaluated at the bedside today, he has been relocated to a room closer to the nurses' station for safety. He is awake and appears ill, but is not in any acute distress. He was observed lying in bed, less interactive, and declined to engage further in conversation. When asked if he needed anything, he responded none. The visit concluded with reassurance that our team is here to support his recovery. No acute concerns were identified from a palliative care perspective.  Recommendations/Plan:  Maintain DNR/DNI per family's wish.  Continue to treat the treatable, provide appropriate medical interventions, e.g., antibiotics for UTI. Symptom management as per attending: Oxycodone  5 mg PO every 6 hours PRN for moderate pain Acetaminophen  (Tylenol ) 650 mg PO every 6 hours PRN for mild pain Seroquel  50 mg PO BID PRN for agitation and insomnia Haldol  1 mg IV every 6 hours PRN for agitation Continue follow up with oncology; family remains open to exploring viable treatment options as recommended by oncology. Post-discharge, recommend outpatient oncology palliative care follow-up for ongoing goals of care discussions. Plan for discharge back to group home; Lawanda (group home owner) confirmed their ability to provide continued assistance and that PT services can be accommodated at the group home. Continue to provide holistic  palliative support.  Code Status: DNR-Limited.     Code Status Orders  (From admission, onward)           Start     Ordered   09/16/23 2128  Full code  Continuous       Question:  By:  Answer:  Consent: discussion documented in EHR   09/16/23 2128           Code Status History     Date Active Date Inactive Code Status Order ID Comments User Context   07/29/2023 1302 08/03/2023 1908 Full Code [id number]  Sherre Greig SAILOR, DO ED   07/06/2023 1312 07/08/2023 2038 Full Code [id number]  Sherre Greig SAILOR, DO ED   04/24/2023 2246 04/26/2023 1655 Full Code [id number]  Hilma Rankins, MD ED   03/29/2017 1705 04/01/2017 2026 Full Code [id number]  Lonzo Pollen, MD Inpatient   01/22/2012 2118 01/29/2012 1608 Full Code [id number]  Alm Romance, MD Inpatient      Advance Directive Documentation    Flowsheet Row Most Recent Value  Type of Advance Directive Healthcare Power of Attorney  Pre-existing out of facility DNR order (yellow form or pink MOST form) --  MOST Form in Place? 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  Discharge Planning: When deemed stable and safe for discharge, patient will be anticipating to return to group home with PT services, follow-up with outpatient oncology and palliative care.  Care plan was discussed  with treatment team, and family members.   Total time spent: 30 minutes   Thank you for allowing the Palliative Medicine Team to assist in the care of this patient.    Kathlyne JULIANNA Tracie Mickey, NP  Please contact Palliative Medicine Team phone at [phone] for questions and concerns.

## 2023-09-25 NOTE — Progress Notes (Addendum)
 PROGRESS NOTE    Jay Flores  FMW:[id number] DOB: [birth year] DOA: 09/16/2023 PCP: [name], MD   Assessment & Plan:   Principal Problem:   Acute kidney injury superimposed on stage 3b chronic kidney disease (HCC) Active Problems:   Bilateral hydronephrosis   Urinary retention   Bladder wall thickening   DVT (deep venous thrombosis)_left leg   Pulmonary embolism (HCC)   CAD (coronary artery disease)   HTN (hypertension)   Type II diabetes mellitus with renal manifestations (HCC)   HLD (hyperlipidemia)   HIV (human immunodeficiency virus infection) (HCC)   Elevated lactic acid level   Anemia in chronic kidney disease (CKD)   Tobacco use   Protein-calorie malnutrition, severe   AKI (acute kidney injury) (HCC)   Hematuria  Assessment and Plan: AKI on CKDIIIb: w/ bilateral hydronephrosis due to acute urinary retention & has chronic foley obstructed with blood. S/p cystoscopy with clot evacuation 8/14. Nephrostomy tubes were recommended but at this time are not pursuing further invasive treatments.    Urothelial carcinoma/squamous cell carcinoma of bladder: per pathology report from biopsies taken during cystoscopy. Poor prognosis. Not offering treatment currently due to low performance status, severe malnutrition & multiple comorbidities as per onco. Will f/u outpatient w/ onco for further assessment    UTI: urine cx growing klebsiella pneumoniae and e faecalis. Continue on IV unasyn      Hematuria: CT shows large clot within the bladder causing obstruction, diffuse bladder wall thickening. Holding eliquis . Continue w/ foley.   Acute blood loss anemia: w/ hx of ACD. Likely secondary to hematuria. S/p 2 units of pRBCs transfused so far. Complete venofer  as per onco   Prostate cancer: s/p external beam radiation, completed in June 2025.   Chronic urinary retention: continue w/ foley. Catheter exchanged every 4 weeks in the urology clinic. Continue on home dose of  proscar , flomax      DVT left leg and pulmonary embolism: holding home eliquis  secondary to anemia & hematuria   Hx of CAD: w/ elevated troponins, likely secondary to demand ischemia. Continue on statin   HLD: continue on statin   HTN: holding home dose of losartan  due to AKI on CKD  DM2: HbA1c 6.3, well-controlled. Continue on SSI w/ accuchecks    HIV: continue on home dose of juluca      Tobacco use: nicotine  patch to prevent w/drawls          DVT prophylaxis: SCDs Code Status: DNR Family Communication:  Disposition Plan: possibly d/c to SNF  Level of care: Telemetry Medical  Status is: Inpatient Remains inpatient appropriate because: severity of illness    Consultants:  Uro Onco   Procedures:   Antimicrobials: Unasyn    Subjective: Pt c/o fatigue   Objective: Vitals:   09/24/23 1603 09/24/23 1953 09/25/23 0412 09/25/23 0749  BP: (!) 162/86 137/65 136/77 (!) 159/80  Pulse: 96 80 90 81  Resp: 16 18 16 18   Temp: 97.7 F (36.5 C) 98 F (36.7 C) 98.5 F (36.9 C) 98 F (36.7 C)  TempSrc: Oral  Oral   SpO2: 100% 100% 100% 100%  Weight:      Height:        Intake/Output Summary (Last 24 hours) at 09/25/2023 1139 Last data filed at 09/25/2023 0900 Gross per 24 hour  Intake 440 ml  Output 1000 ml  Net -560 ml   Filed Weights   09/16/23 1345 09/19/23 1312  Weight: 47.6 kg 47.6 kg    Examination:  General exam: Appears  uncomfortable Respiratory system: Clear to auscultation. Respiratory effort normal. Cardiovascular system: S1 & S2+. No rubs, gallops or clicks.  Gastrointestinal system: Abdomen is nondistended, soft and nontender. Hypoactive bowel sounds heard. Central nervous system: Alert and awake Psychiatry: Judgement and insight appears poor. Flat mood and affect    Data Reviewed: I have personally reviewed following labs and imaging studies  CBC: Recent Labs  Lab 09/19/23 0321 09/20/23 0618 09/21/23 0546 09/22/23 0428  09/24/23 0755  WBC 13.8* 13.8* 11.3* 10.7* 10.7*  HGB 9.2* 8.8* 8.2* 8.3* 8.1*  HCT 28.7* 28.2* 26.1* 26.9* 26.5*  MCV 86.4 87.6 86.7 88.2 89.2  PLT 204 200 178 188 209   Basic Metabolic Panel: Recent Labs  Lab 09/19/23 0842 09/20/23 0618 09/21/23 0546 09/22/23 0428 09/24/23 0755  NA 136 140 136 137 139  K 4.8 4.8 4.1 4.5 4.6  CL 105 107 105 104 105  CO2 22 23 24 23 24   GLUCOSE 191* 179* 157* 143* 135*  BUN 56* 53* 54* 52* 41*  CREATININE 4.49* 3.89* 3.41* 3.10* 3.16*  CALCIUM  8.7* 8.9 8.9 8.7* 8.6*   GFR: Estimated Creatinine Clearance: 16.3 mL/min (A) (by C-G formula based on SCr of 3.16 mg/dL (H)). Liver Function Tests: No results for input(s): AST, ALT, ALKPHOS, BILITOT, PROT, ALBUMIN  in the last 168 hours. No results for input(s): LIPASE, AMYLASE in the last 168 hours. No results for input(s): AMMONIA in the last 168 hours. Coagulation Profile: No results for input(s): INR, PROTIME in the last 168 hours. Cardiac Enzymes: No results for input(s): CKTOTAL, CKMB, CKMBINDEX, TROPONINI in the last 168 hours. BNP (last 3 results) No results for input(s): PROBNP in the last 8760 hours. HbA1C: No results for input(s): HGBA1C in the last 72 hours. CBG: Recent Labs  Lab 09/23/23 2122 09/24/23 0720 09/24/23 1141 09/24/23 1928 09/25/23 0751  GLUCAP 153* 147* 477* 150* 137*   Lipid Profile: No results for input(s): CHOL, HDL, LDLCALC, TRIG, CHOLHDL, LDLDIRECT in the last 72 hours. Thyroid Function Tests: No results for input(s): TSH, T4TOTAL, FREET4, T3FREE, THYROIDAB in the last 72 hours. Anemia Panel: No results for input(s): VITAMINB12, FOLATE, FERRITIN, TIBC, IRON , RETICCTPCT in the last 72 hours. Sepsis Labs: No results for input(s): PROCALCITON, LATICACIDVEN in the last 168 hours.  Recent Results (from the past 240 hours)  Blood culture (routine x 2)     Status: None   Collection Time:  09/16/23  4:40 PM   Specimen: BLOOD  Result Value Ref Range Status   Specimen Description BLOOD BLOOD RIGHT ARM  Final   Special Requests BOTTLES DRAWN AEROBIC AND ANAEROBIC BCLV  Final   Culture   Final    NO GROWTH 5 DAYS Performed at [facility], [address], [place], [place] [address]    Report Status 09/21/2023 FINAL  Final  Blood culture (routine x 2)     Status: None   Collection Time: 09/16/23  5:10 PM   Specimen: BLOOD  Result Value Ref Range Status   Specimen Description BLOOD BLOOD LEFT FOREARM  Final   Special Requests BOTTLES DRAWN AEROBIC AND ANAEROBIC BCLV  Final   Culture   Final    NO GROWTH 5 DAYS Performed at [facility], [address], [place], [place] [address]    Report Status 09/21/2023 FINAL  Final  Urine Culture     Status: Abnormal   Collection Time: 09/16/23 10:19 PM   Specimen: Urine, Clean Catch  Result Value Ref Range Status   Specimen  Description   Final    URINE, CLEAN CATCH Performed at [facility], [address] [place], [place] [address]    Special Requests   Final    NONE Performed at [facility], [address] [place], [place] [address]    Culture (A)  Final    >=100,000 COLONIES/mL KLEBSIELLA PNEUMONIAE >=100,000 COLONIES/mL ENTEROCOCCUS FAECALIS    Report Status 09/20/2023 FINAL  Final   Organism ID, Bacteria KLEBSIELLA PNEUMONIAE (A)  Final   Organism ID, Bacteria ENTEROCOCCUS FAECALIS (A)  Final      Susceptibility   Enterococcus faecalis - MIC*    AMPICILLIN  <=2 SENSITIVE Sensitive     NITROFURANTOIN <=16 SENSITIVE Sensitive     VANCOMYCIN 1 SENSITIVE Sensitive     * >=100,000 COLONIES/mL ENTEROCOCCUS FAECALIS   Klebsiella pneumoniae - MIC*    AMPICILLIN  >=32 RESISTANT Resistant     CEFAZOLIN  Value in next row Sensitive      2 SENSITIVEThis is a modified FDA-approved test that has been validated and its performance characteristics determined by the reporting laboratory.  This  laboratory is certified under the Clinical Laboratory Improvement Amendments CLIA as qualified to perform high complexity clinical laboratory testing.    CEFEPIME  Value in next row Sensitive      2 SENSITIVEThis is a modified FDA-approved test that has been validated and its performance characteristics determined by the reporting laboratory.  This laboratory is certified under the Clinical Laboratory Improvement Amendments CLIA as qualified to perform high complexity clinical laboratory testing.    ERTAPENEM Value in next row Sensitive      2 SENSITIVEThis is a modified FDA-approved test that has been validated and its performance characteristics determined by the reporting laboratory.  This laboratory is certified under the Clinical Laboratory Improvement Amendments CLIA as qualified to perform high complexity clinical laboratory testing.    CEFTRIAXONE  Value in next row Sensitive      2 SENSITIVEThis is a modified FDA-approved test that has been validated and its performance characteristics determined by the reporting laboratory.  This laboratory is certified under the Clinical Laboratory Improvement Amendments CLIA as qualified to perform high complexity clinical laboratory testing.    CIPROFLOXACIN  Value in next row Sensitive      2 SENSITIVEThis is a modified FDA-approved test that has been validated and its performance characteristics determined by the reporting laboratory.  This laboratory is certified under the Clinical Laboratory Improvement Amendments CLIA as qualified to perform high complexity clinical laboratory testing.    GENTAMICIN  Value in next row Sensitive      2 SENSITIVEThis is a modified FDA-approved test that has been validated and its performance characteristics determined by the reporting laboratory.  This laboratory is certified under the Clinical Laboratory Improvement Amendments CLIA as qualified to perform high complexity clinical laboratory testing.    NITROFURANTOIN Value in  next row Resistant      2 SENSITIVEThis is a modified FDA-approved test that has been validated and its performance characteristics determined by the reporting laboratory.  This laboratory is certified under the Clinical Laboratory Improvement Amendments CLIA as qualified to perform high complexity clinical laboratory testing.    TRIMETH /SULFA  Value in next row Sensitive      2 SENSITIVEThis is a modified FDA-approved test that has been validated and its performance characteristics determined by the reporting laboratory.  This laboratory is certified under the Clinical Laboratory Improvement Amendments CLIA as qualified to perform high complexity clinical laboratory testing.    AMPICILLIN /SULBACTAM Value in next  row Sensitive      2 SENSITIVEThis is a modified FDA-approved test that has been validated and its performance characteristics determined by the reporting laboratory.  This laboratory is certified under the Clinical Laboratory Improvement Amendments CLIA as qualified to perform high complexity clinical laboratory testing.    PIP/TAZO Value in next row Sensitive ug/mL     <=4 SENSITIVEThis is a modified FDA-approved test that has been validated and its performance characteristics determined by the reporting laboratory.  This laboratory is certified under the Clinical Laboratory Improvement Amendments CLIA as qualified to perform high complexity clinical laboratory testing.    MEROPENEM Value in next row Sensitive      <=4 SENSITIVEThis is a modified FDA-approved test that has been validated and its performance characteristics determined by the reporting laboratory.  This laboratory is certified under the Clinical Laboratory Improvement Amendments CLIA as qualified to perform high complexity clinical laboratory testing.    * >=100,000 COLONIES/mL KLEBSIELLA PNEUMONIAE         Radiology Studies: No results found.      Scheduled Meds:  atorvastatin   20 mg Oral QHS   Chlorhexidine   Gluconate Cloth  6 each Topical Daily   cholecalciferol   1,000 Units Oral QHS   dolutegravir -rilpivirine   1 tablet Oral Daily   feeding supplement  237 mL Oral BID BM   finasteride   5 mg Oral Daily   folic acid   1 mg Oral Daily   insulin  aspart  0-9 Units Subcutaneous TID WC   metoprolol  succinate  100 mg Oral Daily   multivitamin with minerals  1 tablet Oral Daily   nicotine   21 mg Transdermal Daily   sodium bicarbonate   650 mg Oral BID   tamsulosin   0.4 mg Oral QPC supper   Vilazodone  HCl  20 mg Oral Daily   Continuous Infusions:  ampicillin -sulbactam (UNASYN ) IV 3 g (09/24/23 2300)   sodium chloride  irrigation       LOS: 9 days       Anthony CHRISTELLA Pouch, MD [facility] Pager [phone]  If 7PM-7AM, please contact night-coverage [email or web address] 09/25/2023, 11:39 AM

## 2023-09-25 NOTE — Progress Notes (Addendum)
 PT Cancellation Note  Patient Details Name: Jay Flores MRN: [id number] DOB: [birth year]   Cancelled Treatment:    Reason Eval/Treat Not Completed: Patient declined, no reason specified. Pt asleep in bed with covers overhead. Declining participation with PT treatment right now. Agreeable for PT attempt later today. Will follow up as appropriate.  ADDENDUM 1233: Pt supine in bed with nursing at bedside. Initially agreeable for PT treatment session but then declined when PT attempted mobility.   Camie CHARLENA Kluver, PT, DPT 2:52 PM,09/25/23 Physical Therapist - [facility]

## 2023-09-26 DIAGNOSIS — N179 Acute kidney failure, unspecified: Secondary | ICD-10-CM | POA: Diagnosis not present

## 2023-09-26 DIAGNOSIS — N1832 Chronic kidney disease, stage 3b: Secondary | ICD-10-CM | POA: Diagnosis not present

## 2023-09-26 LAB — BASIC METABOLIC PANEL WITH GFR
Anion gap: 8 (ref 5–15)
BUN: 32 mg/dL — ABNORMAL HIGH (ref 8–23)
CO2: 23 mmol/L (ref 22–32)
Calcium: 8.7 mg/dL — ABNORMAL LOW (ref 8.9–10.3)
Chloride: 108 mmol/L (ref 98–111)
Creatinine, Ser: 2.75 mg/dL — ABNORMAL HIGH (ref 0.61–1.24)
GFR, Estimated: 25 mL/min — ABNORMAL LOW (ref >60)
Glucose, Bld: 137 mg/dL — ABNORMAL HIGH (ref 70–99)
Potassium: 4.4 mmol/L (ref 3.5–5.1)
Sodium: 139 mmol/L (ref 135–145)

## 2023-09-26 LAB — GLUCOSE, CAPILLARY
Glucose-Capillary: 133 mg/dL — ABNORMAL HIGH (ref 70–99)
Glucose-Capillary: 173 mg/dL — ABNORMAL HIGH (ref 70–99)
Glucose-Capillary: 341 mg/dL — ABNORMAL HIGH (ref 70–99)
Glucose-Capillary: 103 mg/dL — ABNORMAL HIGH (ref 70–99)
Glucose-Capillary: 405 mg/dL — ABNORMAL HIGH (ref 70–99)

## 2023-09-26 LAB — CBC
HCT: 23.8 % — ABNORMAL LOW (ref 39.0–52.0)
Hemoglobin: 7.4 g/dL — ABNORMAL LOW (ref 13.0–17.0)
MCH: 27.8 pg (ref 26.0–34.0)
MCHC: 31.1 g/dL (ref 30.0–36.0)
MCV: 89.5 fL (ref 80.0–100.0)
Platelets: 210 K/uL (ref 150–400)
RBC: 2.66 MIL/uL — ABNORMAL LOW (ref 4.22–5.81)
RDW: 16.7 % — ABNORMAL HIGH (ref 11.5–15.5)
WBC: 11.5 K/uL — ABNORMAL HIGH (ref 4.0–10.5)
nRBC: 0 % (ref 0.0–0.2)

## 2023-09-26 MED ORDER — INSULIN ASPART 100 UNIT/ML IJ SOLN: 5.0000 [IU] | Freq: Once | INTRAMUSCULAR | Status: DC

## 2023-09-26 MED ORDER — INSULIN ASPART 100 UNIT/ML IJ SOLN
3.0000 [IU] | Freq: Once | INTRAMUSCULAR | Status: AC
  Administered 2023-09-26: 3 [IU] via SUBCUTANEOUS
  Filled 2023-09-26: qty 1

## 2023-09-26 NOTE — Plan of Care (Signed)

## 2023-09-26 NOTE — Plan of Care (Signed)
 Pt refused any foley care today. His leg adhesive came off and refused any securement device. Pt was educated that this will cause painful pulling on the foley.

## 2023-09-26 NOTE — Progress Notes (Addendum)
 PROGRESS NOTE    Jay Flores  FMW:[id number] DOB: 09/06/1960 DOA: 09/16/2023 PCP: [name], MD   Assessment & Plan:   Principal Problem:   Acute kidney injury superimposed on stage 3b chronic kidney disease (HCC) Active Problems:   Bilateral hydronephrosis   Urinary retention   Bladder wall thickening   DVT (deep venous thrombosis)_left leg   Pulmonary embolism (HCC)   CAD (coronary artery disease)   HTN (hypertension)   Type II diabetes mellitus with renal manifestations (HCC)   HLD (hyperlipidemia)   HIV (human immunodeficiency virus infection) (HCC)   Elevated lactic acid level   Anemia in chronic kidney disease (CKD)   Tobacco use   Protein-calorie malnutrition, severe   AKI (acute kidney injury) (HCC)   Hematuria  Assessment and Plan: AKI on CKDIIIb: w/ bilateral hydronephrosis due to acute urinary retention & has chronic foley obstructed with blood. S/p cystoscopy with clot evacuation 8/14. Nephrostomy tubes were recommended but at this time are not pursuing further invasive treatments. Cr is trending down from day prior.   Urothelial carcinoma/squamous cell carcinoma of bladder: per pathology report from biopsies taken during cystoscopy. Poor prognosis. Not offering treatment currently due to low performance status, severe malnutrition & multiple comorbidities as per onco. Will f/u outpatient w/ onco for further assessment    UTI: urine cx growing klebsiella pneumoniae and e faecalis. Continue on IV unasyn      Hematuria: CT shows large clot within the bladder causing obstruction, diffuse bladder wall thickening. Holding eliquis . Continue w/ foley    Acute blood loss anemia: w/ hx of ACD. Likely secondary to hematuria. S/p 2 units of pRBCs transfused so far. Complete venofer  as per onco. H&H are trending down. Will transfuse if Hb < 7.0    Prostate cancer: s/p external beam radiation, completed in June 2025.   Chronic urinary retention: continue w/  foley. Catheter exchanged every 4 weeks in the urology clinic. Continue on home dose of flomax , proscar    DVT left leg and pulmonary embolism: holding home eliquis  secondary to anemia & hematuria   Hx of CAD: w/ elevated troponins, likely secondary to demand ischemia. Continue on statin   HLD: continue on statin   HTN: holding home dose of losartan  secondary to AKI on CKD  DM2: HbA1c 6.3, well-controlled. Continue on SSI w/ accuchecks    HIV: continue on home dose of juluca .    Tobacco use: nicotine  patch to prevent w/drawls          DVT prophylaxis: SCDs Code Status: DNR Family Communication: called pt's sister, [name], and no answer Disposition Plan: possibly d/c to SNF  Level of care: Telemetry Medical  Status is: Inpatient Remains inpatient appropriate because: severity of illness    Consultants:  Uro Onco   Procedures:   Antimicrobials: Unasyn    Subjective: Pt appears depressed Objective: Vitals:   09/25/23 2031 09/25/23 2032 09/26/23 0452 09/26/23 0852  BP: (!) 146/99 (!) 159/86 134/74 137/71  Pulse: 77 74 91 78  Resp:  17 15 18   Temp: 97.6 F (36.4 C)  98.1 F (36.7 C) 98.3 F (36.8 C)  TempSrc: Oral   Oral  SpO2: 100% 100% 100% 100%  Weight:      Height:        Intake/Output Summary (Last 24 hours) at 09/26/2023 0926 Last data filed at 09/26/2023 0125 Gross per 24 hour  Intake 480 ml  Output 3600 ml  Net -3120 ml   Filed Weights   09/16/23 1345  09/19/23 1312  Weight: 47.6 kg 47.6 kg    Examination:  General exam: Appears depressed Respiratory system: decreased breath sounds b/l  Cardiovascular system: S1/S2+. No rubs or clicks  Gastrointestinal system: abd is soft, NT, ND & hypoactive bowel sounds Central nervous system: Alert and awake Psychiatry: judgement and insight appears poor. Flat mood and affect    Data Reviewed: I have personally reviewed following labs and imaging studies  CBC: Recent Labs  Lab 09/20/23 0618  09/21/23 0546 09/22/23 0428 09/24/23 0755 09/26/23 0437  WBC 13.8* 11.3* 10.7* 10.7* 11.5*  HGB 8.8* 8.2* 8.3* 8.1* 7.4*  HCT 28.2* 26.1* 26.9* 26.5* 23.8*  MCV 87.6 86.7 88.2 89.2 89.5  PLT 200 178 188 209 210   Basic Metabolic Panel: Recent Labs  Lab 09/20/23 0618 09/21/23 0546 09/22/23 0428 09/24/23 0755 09/26/23 0437  NA 140 136 137 139 139  K 4.8 4.1 4.5 4.6 4.4  CL 107 105 104 105 108  CO2 23 24 23 24 23   GLUCOSE 179* 157* 143* 135* 137*  BUN 53* 54* 52* 41* 32*  CREATININE 3.89* 3.41* 3.10* 3.16* 2.75*  CALCIUM  8.9 8.9 8.7* 8.6* 8.7*   GFR: Estimated Creatinine Clearance: 18.8 mL/min (A) (by C-G formula based on SCr of 2.75 mg/dL (H)). Liver Function Tests: No results for input(s): AST, ALT, ALKPHOS, BILITOT, PROT, ALBUMIN  in the last 168 hours. No results for input(s): LIPASE, AMYLASE in the last 168 hours. No results for input(s): AMMONIA in the last 168 hours. Coagulation Profile: No results for input(s): INR, PROTIME in the last 168 hours. Cardiac Enzymes: No results for input(s): CKTOTAL, CKMB, CKMBINDEX, TROPONINI in the last 168 hours. BNP (last 3 results) No results for input(s): PROBNP in the last 8760 hours. HbA1C: No results for input(s): HGBA1C in the last 72 hours. CBG: Recent Labs  Lab 09/25/23 1139 09/25/23 1554 09/25/23 2034 09/26/23 0637 09/26/23 0854  GLUCAP 249* 148* 180* 133* 173*   Lipid Profile: No results for input(s): CHOL, HDL, LDLCALC, TRIG, CHOLHDL, LDLDIRECT in the last 72 hours. Thyroid Function Tests: No results for input(s): TSH, T4TOTAL, FREET4, T3FREE, THYROIDAB in the last 72 hours. Anemia Panel: No results for input(s): VITAMINB12, FOLATE, FERRITIN, TIBC, IRON , RETICCTPCT in the last 72 hours. Sepsis Labs: No results for input(s): PROCALCITON, LATICACIDVEN in the last 168 hours.  Recent Results (from the past 240 hours)  Blood culture (routine  x 2)     Status: None   Collection Time: 09/16/23  4:40 PM   Specimen: BLOOD  Result Value Ref Range Status   Specimen Description BLOOD BLOOD RIGHT ARM  Final   Special Requests BOTTLES DRAWN AEROBIC AND ANAEROBIC BCLV  Final   Culture   Final    NO GROWTH 5 DAYS Performed at [facility], [address], [place], [place] [address]    Report Status 09/21/2023 FINAL  Final  Blood culture (routine x 2)     Status: None   Collection Time: 09/16/23  5:10 PM   Specimen: BLOOD  Result Value Ref Range Status   Specimen Description BLOOD BLOOD LEFT FOREARM  Final   Special Requests BOTTLES DRAWN AEROBIC AND ANAEROBIC BCLV  Final   Culture   Final    NO GROWTH 5 DAYS Performed at [facility], [address], [place], [place] [address]    Report Status 09/21/2023 FINAL  Final  Urine Culture     Status: Abnormal   Collection Time: 09/16/23 10:19 PM   Specimen: Urine,  Clean Catch  Result Value Ref Range Status   Specimen Description   Final    URINE, CLEAN CATCH Performed at [facility], [address] [place], [place] [address]    Special Requests   Final    NONE Performed at [facility], [address] [place], [place] [address]    Culture (A)  Final    >=100,000 COLONIES/mL KLEBSIELLA PNEUMONIAE >=100,000 COLONIES/mL ENTEROCOCCUS FAECALIS    Report Status 09/20/2023 FINAL  Final   Organism ID, Bacteria KLEBSIELLA PNEUMONIAE (A)  Final   Organism ID, Bacteria ENTEROCOCCUS FAECALIS (A)  Final      Susceptibility   Enterococcus faecalis - MIC*    AMPICILLIN  <=2 SENSITIVE Sensitive     NITROFURANTOIN <=16 SENSITIVE Sensitive     VANCOMYCIN 1 SENSITIVE Sensitive     * >=100,000 COLONIES/mL ENTEROCOCCUS FAECALIS   Klebsiella pneumoniae - MIC*    AMPICILLIN  >=32 RESISTANT Resistant     CEFAZOLIN  Value in next row Sensitive      2 SENSITIVEThis is a modified FDA-approved test that has been validated and its performance characteristics  determined by the reporting laboratory.  This laboratory is certified under the Clinical Laboratory Improvement Amendments CLIA as qualified to perform high complexity clinical laboratory testing.    CEFEPIME  Value in next row Sensitive      2 SENSITIVEThis is a modified FDA-approved test that has been validated and its performance characteristics determined by the reporting laboratory.  This laboratory is certified under the Clinical Laboratory Improvement Amendments CLIA as qualified to perform high complexity clinical laboratory testing.    ERTAPENEM Value in next row Sensitive      2 SENSITIVEThis is a modified FDA-approved test that has been validated and its performance characteristics determined by the reporting laboratory.  This laboratory is certified under the Clinical Laboratory Improvement Amendments CLIA as qualified to perform high complexity clinical laboratory testing.    CEFTRIAXONE  Value in next row Sensitive      2 SENSITIVEThis is a modified FDA-approved test that has been validated and its performance characteristics determined by the reporting laboratory.  This laboratory is certified under the Clinical Laboratory Improvement Amendments CLIA as qualified to perform high complexity clinical laboratory testing.    CIPROFLOXACIN  Value in next row Sensitive      2 SENSITIVEThis is a modified FDA-approved test that has been validated and its performance characteristics determined by the reporting laboratory.  This laboratory is certified under the Clinical Laboratory Improvement Amendments CLIA as qualified to perform high complexity clinical laboratory testing.    GENTAMICIN  Value in next row Sensitive      2 SENSITIVEThis is a modified FDA-approved test that has been validated and its performance characteristics determined by the reporting laboratory.  This laboratory is certified under the Clinical Laboratory Improvement Amendments CLIA as qualified to perform high complexity clinical  laboratory testing.    NITROFURANTOIN Value in next row Resistant      2 SENSITIVEThis is a modified FDA-approved test that has been validated and its performance characteristics determined by the reporting laboratory.  This laboratory is certified under the Clinical Laboratory Improvement Amendments CLIA as qualified to perform high complexity clinical laboratory testing.    TRIMETH /SULFA  Value in next row Sensitive      2 SENSITIVEThis is a modified FDA-approved test that has been validated and its performance characteristics determined by the reporting laboratory.  This laboratory is certified under the Clinical Laboratory Improvement Amendments CLIA as qualified to perform high  complexity clinical laboratory testing.    AMPICILLIN /SULBACTAM Value in next row Sensitive      2 SENSITIVEThis is a modified FDA-approved test that has been validated and its performance characteristics determined by the reporting laboratory.  This laboratory is certified under the Clinical Laboratory Improvement Amendments CLIA as qualified to perform high complexity clinical laboratory testing.    PIP/TAZO Value in next row Sensitive ug/mL     <=4 SENSITIVEThis is a modified FDA-approved test that has been validated and its performance characteristics determined by the reporting laboratory.  This laboratory is certified under the Clinical Laboratory Improvement Amendments CLIA as qualified to perform high complexity clinical laboratory testing.    MEROPENEM Value in next row Sensitive      <=4 SENSITIVEThis is a modified FDA-approved test that has been validated and its performance characteristics determined by the reporting laboratory.  This laboratory is certified under the Clinical Laboratory Improvement Amendments CLIA as qualified to perform high complexity clinical laboratory testing.    * >=100,000 COLONIES/mL KLEBSIELLA PNEUMONIAE         Radiology Studies: No results found.      Scheduled Meds:   atorvastatin   20 mg Oral QHS   Chlorhexidine  Gluconate Cloth  6 each Topical Daily   cholecalciferol   1,000 Units Oral QHS   dolutegravir -rilpivirine   1 tablet Oral Daily   feeding supplement  237 mL Oral BID BM   finasteride   5 mg Oral Daily   folic acid   1 mg Oral Daily   insulin  aspart  0-9 Units Subcutaneous TID WC   metoprolol  succinate  100 mg Oral Daily   multivitamin with minerals  1 tablet Oral Daily   nicotine   21 mg Transdermal Daily   sodium bicarbonate   650 mg Oral BID   tamsulosin   0.4 mg Oral QPC supper   Vilazodone  HCl  20 mg Oral Daily   Continuous Infusions:  ampicillin -sulbactam (UNASYN ) IV 3 g (09/25/23 2258)   sodium chloride  irrigation       LOS: 10 days       Anthony CHRISTELLA Pouch, MD [facility] Pager [phone]  If 7PM-7AM, please contact night-coverage [email or web address] 09/26/2023, 9:26 AM

## 2023-09-26 NOTE — Progress Notes (Addendum)
 PT Cancellation Note  Patient Details Name: Jay Flores MRN: [id number] DOB: [birth year]   Cancelled Treatment:    Reason Eval/Treat Not Completed: Patient declined, no reason specified. Pt supine in bed upon entry; declining participation with PT due to not feeling well. Requesting soda; cleared by RN. Agreeable for PT to follow up later today.  ADDENDUM 1303: Pt received supine in bed. Declining participation with PT this afternoon despite encouragement regarding benefits for maintenance of IND with mobility and safe DC. He verbalized understanding. Will continue efforts as appropriate.    Camie CHARLENA Kluver, PT, DPT 1:07 PM,09/26/23 Physical Therapist - [facility]

## 2023-09-26 NOTE — Progress Notes (Signed)
 Nutrition Follow-up  DOCUMENTATION CODES:   Severe malnutrition in context of chronic illness, Underweight  INTERVENTION:   -Continue MVI with minerals daily -Continue regular diet -Magic cup TID with meals, each supplement provides 290 kcal and 9 grams of protein   NUTRITION DIAGNOSIS:   Severe Malnutrition related to chronic illness (HIV, dementia) as evidenced by moderate fat depletion, severe fat depletion, moderate muscle depletion, severe muscle depletion, percent weight loss.  Ongoing  GOAL:   Patient will meet greater than or equal to 90% of their needs  Progressing   MONITOR:   PO intake, Supplement acceptance, Diet advancement  REASON FOR ASSESSMENT:   Consult Assessment of nutrition requirement/status  ASSESSMENT:   Pt with medical history significant of hypertension, hyperlipidemia, diabetes mellitus, COPD, CKD-3B, HIV (CD 258 on 03/29/17 and VL 70 on 04/25/23), HIV dementia HCV (s/p of Harvoni), thrombocytopenia, prostate cancer, indwelling Foley catheter placement, iron  deficiency anemia, DVT/PE on Eliquis , who presents with lower abdominal pain and hematuria.  8/14- s/p cystoscopy, transurethral resection of bladder tumor   Reviewed I/O's: -2.9 L x 24 hours and -35.5 L since admission  UOP: 3.6 L x 24 hours   Pt in room at time of visit, with blankets covering his head. He did not awaken to name being called. No family present.   Pt remains on a regular diet. Noted meal completions 20-100%.   Palliative care following for goals of care discussions.   Medications reviewed and include folic acid , sodium bicarbonate , and vitamin D3.   Labs reviewed: CBGS: 133-180 (inpatient orders for glycemic control are 0-9 units insulin  aspart TID with meals).    Diet Order:   Diet Order             Diet regular Room service appropriate? Yes; Fluid consistency: Thin  Diet effective now                   EDUCATION NEEDS:   Not appropriate for education  at this time  Skin:  Skin Assessment: Skin Integrity Issues: Skin Integrity Issues:: Incisions Incisions: closed surgical incision to penis Other: wound to mid coccyx  Last BM:  09/26/23 (type 5)  Height:   Ht Readings from Last 1 Encounters:  09/19/23 5' 8 (1.727 m)    Weight:   Wt Readings from Last 1 Encounters:  09/19/23 47.6 kg    Ideal Body Weight:  70 kg  BMI:  Body mass index is 15.96 kg/m.  Estimated Nutritional Needs:   Kcal:  1900-2100  Protein:  105-120 grams  Fluid:  1.9-2.1 L    Margery ORN, RD, LDN, CDCES Registered Dietitian III Certified Diabetes Care and Education Specialist If unable to reach this RD, please use RD Inpatient group chat on secure chat between hours of 8am-4 pm daily

## 2023-09-27 ENCOUNTER — Ambulatory Visit: Admitting: Physician Assistant

## 2023-09-27 DIAGNOSIS — N179 Acute kidney failure, unspecified: Secondary | ICD-10-CM | POA: Diagnosis not present

## 2023-09-27 DIAGNOSIS — N1832 Chronic kidney disease, stage 3b: Secondary | ICD-10-CM | POA: Diagnosis not present

## 2023-09-27 LAB — BASIC METABOLIC PANEL WITH GFR
Anion gap: 12 (ref 5–15)
BUN: 34 mg/dL — ABNORMAL HIGH (ref 8–23)
CO2: 20 mmol/L — ABNORMAL LOW (ref 22–32)
Calcium: 8.4 mg/dL — ABNORMAL LOW (ref 8.9–10.3)
Chloride: 105 mmol/L (ref 98–111)
Creatinine, Ser: 3.07 mg/dL — ABNORMAL HIGH (ref 0.61–1.24)
GFR, Estimated: 22 mL/min — ABNORMAL LOW (ref >60)
Glucose, Bld: 156 mg/dL — ABNORMAL HIGH (ref 70–99)
Potassium: 4.4 mmol/L (ref 3.5–5.1)
Sodium: 137 mmol/L (ref 135–145)

## 2023-09-27 LAB — CBC
HCT: 23.3 % — ABNORMAL LOW (ref 39.0–52.0)
Hemoglobin: 7.2 g/dL — ABNORMAL LOW (ref 13.0–17.0)
MCH: 27.3 pg (ref 26.0–34.0)
MCHC: 30.9 g/dL (ref 30.0–36.0)
MCV: 88.3 fL (ref 80.0–100.0)
Platelets: 200 K/uL (ref 150–400)
RBC: 2.64 MIL/uL — ABNORMAL LOW (ref 4.22–5.81)
RDW: 16.1 % — ABNORMAL HIGH (ref 11.5–15.5)
WBC: 13.3 K/uL — ABNORMAL HIGH (ref 4.0–10.5)
nRBC: 0 % (ref 0.0–0.2)

## 2023-09-27 LAB — GLUCOSE, CAPILLARY
Glucose-Capillary: 146 mg/dL — ABNORMAL HIGH (ref 70–99)
Glucose-Capillary: 276 mg/dL — ABNORMAL HIGH (ref 70–99)
Glucose-Capillary: 133 mg/dL — ABNORMAL HIGH (ref 70–99)
Glucose-Capillary: 271 mg/dL — ABNORMAL HIGH (ref 70–99)

## 2023-09-27 MED ORDER — SODIUM CHLORIDE 0.9 % IV SOLN
3.0000 g | Freq: Two times a day (BID) | INTRAVENOUS | Status: AC
  Administered 2023-09-28: 3 g via INTRAVENOUS
  Filled 2023-09-27: qty 8

## 2023-09-27 NOTE — Progress Notes (Signed)
 Occupational Therapy Treatment Patient Details Name: Jay Flores MRN: [id number] DOB: Apr 26, 1960 Today's Date: 09/27/2023   History of present illness Pt is a 63 y.o. male, group home resident,  with medical history significant of hypertension, hyperlipidemia, diabetes mellitus, COPD, CKD-3B, HIV (CD 258 on 03/29/17 and VL 70 on 04/25/23), HIV dementia HCV (s/p of Harvoni), thrombocytopenia, prostate cancer, indwelling Foley catheter placement, iron  deficiency anemia, DVT/PE on Eliquis , who presents to ED  08/11 with lower abdominal pain and hematuria. S/p cystoscopy with trans urethral resection of bladder tumor on 8/15.   OT comments  Pt is supine in bed on arrival. Alert, but minimally verbal, mostly states no to all questions even when not appropriate. Did agree to grooming tasks, however when encouraged to sit at EOB to be performed, pt declined and would only agree to bed level performance. Bed was placed in chair position for session and pt demo grooming tasks with setup assist at bed level. He adamantly declined further activities and would not reposition himself towards the [facility]. He did demo rolling to R side in bed with supervision. Edu on importance of continuing daily activity. He had been up in the chair from PT session earlier this date, but was returned to bed with mechanical lift by nursing staff. Pt left in bed with all needs in place and will cont to require skilled acute OT services to maximize his safety and IND to return to PLOF.       If plan is discharge home, recommend the following:  A lot of help with bathing/dressing/bathroom;A lot of help with walking and/or transfers;Direct supervision/assist for financial management;Assist for transportation;Assistance with cooking/housework;Help with stairs or ramp for entrance;Supervision due to cognitive status;Direct supervision/assist for medications management   Equipment Recommendations  BSC/3in1;Wheelchair (measurements OT)     Recommendations for Other Services      Precautions / Restrictions Precautions Precautions: Fall Recall of Precautions/Restrictions: Impaired Restrictions Weight Bearing Restrictions Per Provider Order: No       Mobility Bed Mobility Overal bed mobility: Needs Assistance Bed Mobility: Rolling Rolling: Supervision         General bed mobility comments: Adamantly declines OOB/EOB activity, states no to most questions during session even when not appropriate; demo ability to roll onto his side with supervision    Transfers                         Balance                                           ADL either performed or assessed with clinical judgement   ADL Overall ADL's : Needs assistance/impaired     Grooming: Wash/dry hands;Wash/dry face;Oral care;Set up;Bed level Grooming Details (indicate cue type and reason): bed in chair position                               General ADL Comments: pt refused further activity and was not agreeable to get EOB or OOB this date    Extremity/Trunk Assessment              Vision       Perception     Praxis     Communication Communication Communication: No apparent difficulties   Cognition Arousal: Alert Behavior During Therapy: Flat affect  Cognition: No family/caregiver present to determine baseline, History of cognitive impairments, Cognition impaired   Orientation impairments: Place, Time, Situation Awareness: Intellectual awareness intact, Online awareness impaired Memory impairment (select all impairments): Working [employer] fast streamer, Copywriter, advertising, Engineer, structural memory Attention impairment (select first level of impairment): Sustained attention, Selective attention, Alternating attention, Divided attention Executive functioning impairment (select all impairments): Sequencing, Problem solving, Initiation, Organization, Reasoning OT - Cognition Comments:  Generally less communicative today, answers no to most questions even when not appropriate. Requires consistent encouragement.                 Following commands: Impaired Following commands impaired: Follows one step commands inconsistently      Cueing   Cueing Techniques: Verbal cues, Tactile cues, Visual cues  Exercises      Shoulder Instructions       General Comments      Pertinent Vitals/ Pain       Pain Assessment Pain Assessment: No/denies pain Pain Intervention(s): Monitored during session  Home Living                                          Prior Functioning/Environment              Frequency  Min 2X/week        Progress Toward Goals  OT Goals(current goals can now be found in the care plan section)  Progress towards OT goals: Not progressing toward goals - comment;OT to reassess next treatment (pt continues to decline OOB/EOB activity, limited by cognition)  Acute Rehab OT Goals OT Goal Formulation: Patient unable to participate in goal setting Time For Goal Achievement: 10/05/23 Potential to Achieve Goals: Fair  Plan      Co-evaluation                 AM-PAC OT 6 Clicks Daily Activity     Outcome Measure   Help from another person eating meals?: None Help from another person taking care of personal grooming?: None Help from another person toileting, which includes using toliet, bedpan, or urinal?: A Lot Help from another person bathing (including washing, rinsing, drying)?: A Lot Help from another person to put on and taking off regular upper body clothing?: A Little Help from another person to put on and taking off regular lower body clothing?: A Lot 6 Click Score: 17    End of Session    OT Visit Diagnosis: Other abnormalities of gait and mobility (R26.89);Muscle weakness (generalized) (M62.81)   Activity Tolerance Other (comment) (limited by cognition, pt declining to perform OOB activity)    Patient Left in bed;with call bell/phone within reach;with bed alarm set   Nurse Communication          Time: 8556-8547 OT Time Calculation (min): 9 min  Charges: OT General Charges $OT Visit: 1 Visit OT Treatments $Self Care/Home Management : 8-22 mins  Jay Flores, OTR/L  09/27/23, 3:17 PM   Jay Flores 09/27/2023, 3:14 PM

## 2023-09-27 NOTE — Inpatient Diabetes Management (Signed)
 Inpatient Diabetes Program Recommendations  AACE/ADA: New Consensus Statement on Inpatient Glycemic Control (2015)  Target Ranges:  Prepandial:   less than 140 mg/dL      Peak postprandial:   less than 180 mg/dL (1-2 hours)      Critically ill patients:  140 - 180 mg/dL   Lab Results  Component Value Date   GLUCAP 146 (H) 09/27/2023   HGBA1C 6.3 (H) 02/21/2023    Review of Glycemic Control  Latest Reference Range & Units 09/26/23 08:54 09/26/23 12:07 09/26/23 17:30 09/26/23 21:24 09/27/23 08:01  Glucose-Capillary 70 - 99 mg/dL 826 (H) 658 (H) 896 (H) 405 (H) 146 (H)   Diabetes history: DM 2 Outpatient Diabetes medications: Jardiance 25 mg q AM Metformin 500 mg daily Current orders for Inpatient glycemic control:  Novolog  0-9 unit tid with meals  Inpatient Diabetes Program Recommendations:   May consider adding Semglee  5 units daily and Novolog  2 units tid with meals (hold if patient eats less than 50% or NPO).   Thanks,  Randall Bullocks, RN, BC-ADM Inpatient Diabetes Coordinator Pager [phone]  (8a-5p)

## 2023-09-27 NOTE — Progress Notes (Signed)
 Physical Therapy Treatment Patient Details Name: Jay Flores MRN: [id number] DOB: [birth year] Today's Date: 09/27/2023   History of Present Illness Pt is a 63 y.o. male, group home resident,  with medical history significant of hypertension, hyperlipidemia, diabetes mellitus, COPD, CKD-3B, HIV (CD 258 on 03/29/17 and VL 70 on 04/25/23), HIV dementia HCV (s/p of Harvoni), thrombocytopenia, prostate cancer, indwelling Foley catheter placement, iron  deficiency anemia, DVT/PE on Eliquis , who presents to ED  08/11 with lower abdominal pain and hematuria. S/p cystoscopy with trans urethral resection of bladder tumor on 8/15.    PT Comments  Pt was supine in bed upon arrival. He is alert and pleasant but lack awareness of situation. Oriented x 2. Follows simple commands with increased time.  Agreeable to session with encouragement. Pt did not require assistance to achieve EOB sitting however does require vcs for improved technique.  Pt agreeable to OOB > W/C. Author requested pt demonstrate how he would transfer to w/c at home. Pt was unsafe with his technique. Pt then performed lateral scoot transfer to w/c 1 x and then even stood (knees flexed) and pivot to w/c with extensive assistance. Pt remain high fall risk with all transfers. Once in w/c, pt able to self propel with supervision. Vcing for energy conservation techniques while propelling w/c. Overall pt remains weak and limited. Dc recs remain appropriate to maximize independent and safety with all ADLs.    If plan is discharge home, recommend the following: A lot of help with walking and/or transfers;A little help with bathing/dressing/bathroom;Direct supervision/assist for medications management;Assistance with cooking/housework;Assist for transportation;Help with stairs or ramp for entrance     Equipment Recommendations  Other (comment);Rolling walker (2 wheels) (w/c cushion, possible slideboard if transfer safety does not improve)        Precautions / Restrictions Precautions Precautions: Fall Recall of Precautions/Restrictions: Impaired Restrictions Weight Bearing Restrictions Per Provider Order: No     Mobility  Bed Mobility  Pt was able to supine> sit EOB without physical assistance however vcs for sequencing improvements  Transfers Overall transfer level: Needs assistance Equipment used: None, Rolling walker (2 wheels) Transfers: Bed to chair/wheelchair/BSC Stand pivot transfers: Mod assist  Anterior-Posterior transfers: Max assist  Lateral/Scoot Transfers: Mod assist General transfer comment: Pt reports he usually places w/c in from of him and perform transfer. Autho had pt demonstrate his way however pt extremely unsafe. Author encouraged pt to perform either laterscoot or stand pivot with using RW.    Ambulation/Gait  General Gait Details: pt is non ambulatory. Was able to self propel w/c in Manufacturing engineer Wheelchair mobility: Yes Wheelchair propulsion: Both upper extremities Wheelchair parts: Supervision/cueing Distance: 100 Wheelchair Assistance Details (indicate cue type and reason): Pt was able to self propel w/c ~ 100 ft with vcing for improved reaching back on w/c rim for energy conservation.     Balance Overall balance assessment: Needs assistance Sitting-balance support: Feet supported, Bilateral upper extremity supported, Single extremity supported Sitting balance-Leahy Scale: Fair     Standing balance support: Reliant on assistive device for balance, During functional activity, Bilateral upper extremity supported Standing balance-Leahy Scale: Poor Standing balance comment: pt is a high fall risk with any/all transfers due to BLE weakness         Communication Communication Communication: No apparent difficulties  Cognition Arousal: Alert Behavior During Therapy: Flat affect   PT - Cognitive impairments: History of cognitive impairments      PT  - Cognition Comments: Pt is  alert but lack insight of situation. poor safety awareness observed Following commands: Impaired Following commands impaired: Follows one step commands inconsistently    Cueing Cueing Techniques: Verbal cues, Tactile cues, Visual cues         Pertinent Vitals/Pain Pain Assessment Pain Assessment: No/denies pain     PT Goals (current goals can now be found in the care plan section) Acute Rehab PT Goals Patient Stated Goal: go home Progress towards PT goals: Not progressing toward goals - comment    Frequency    Min 2X/week       Co-evaluation     PT goals addressed during session: Mobility/safety with mobility;Balance;Proper use of DME;Strengthening/ROM        AM-PAC PT 6 Clicks Mobility   Outcome Measure  Help needed turning from your back to your side while in a flat bed without using bedrails?: A Little Help needed moving from lying on your back to sitting on the side of a flat bed without using bedrails?: A Lot Help needed moving to and from a bed to a chair (including a wheelchair)?: A Lot Help needed standing up from a chair using your arms (e.g., wheelchair or bedside chair)?: A Lot Help needed to walk in hospital room?: Total Help needed climbing 3-5 steps with a railing? : Total 6 Click Score: 11    End of Session   Activity Tolerance: Patient tolerated treatment well Patient left: in chair;with call bell/phone within reach Nurse Communication: Mobility status PT Visit Diagnosis: Unsteadiness on feet (R26.81);Muscle weakness (generalized) (M62.81)     Time: 8942-8884 PT Time Calculation (min) (ACUTE ONLY): 18 min  Charges:    $Wheel Chair Management: 8-22 mins PT General Charges $$ ACUTE PT VISIT: 1 Visit                    Rankin Essex PTA 09/27/23, 3:47 PM

## 2023-09-27 NOTE — TOC Progression Note (Signed)
 Transition of Care [facility]) - Progression Note    Patient Details  Name: KENNAN DETTER MRN: [id number] Date of Birth: [birth year]  [facility] CM/SW Contact  Dalia GORMAN Fuse, RN Phone Number: 09/27/2023, 12:08 PM  Clinical Narrative:    The patient has declined therapy for the past couple of days. He is from [facility]. Biopsies showed bladder cancer with a poor prognosis. Not offering treatment currently due to low performance status, severe malnutrition & multiple comorbidities as per onco. Plan to dc back to Group Home when medically appropriate.    Expected Discharge Plan: Group Home Barriers to Discharge: Continued Medical Work up               Expected Discharge Plan and Services   Discharge Planning Services: CM Consult   Living arrangements for the past 2 months: Group Home                                       Social Drivers of Health (SDOH) Interventions SDOH Screenings   Food Insecurity: No Food Insecurity (09/17/2023)  Housing: Low Risk  (09/17/2023)  Transportation Needs: No Transportation Needs (09/17/2023)  Utilities: Not At Risk (09/17/2023)  Depression (PHQ2-9): Low Risk  (03/23/2022)  Tobacco Use: High Risk (09/16/2023)    Readmission Risk Interventions    07/31/2023    9:41 AM  Readmission Risk Prevention Plan  Transportation Screening Complete  Medication Review (RN Care Manager) Complete  PCP or Specialist appointment within 3-5 days of discharge Complete  SW Recovery Care/Counseling Consult Complete  Palliative Care Screening Not Applicable  Skilled Nursing Facility Not Applicable

## 2023-09-27 NOTE — Plan of Care (Signed)

## 2023-09-27 NOTE — Plan of Care (Signed)
 Patient did well today and was more engaging than prior days.  Closer to discharge with possible therapy at group home.

## 2023-09-27 NOTE — Progress Notes (Signed)
 PROGRESS NOTE    Jay Flores  FMW:[id number] DOB: July 29, 1960 DOA: 09/16/2023 PCP: [name], MD   Assessment & Plan:   Principal Problem:   Acute kidney injury superimposed on stage 3b chronic kidney disease (HCC) Active Problems:   Bilateral hydronephrosis   Urinary retention   Bladder wall thickening   DVT (deep venous thrombosis)_left leg   Pulmonary embolism (HCC)   CAD (coronary artery disease)   HTN (hypertension)   Type II diabetes mellitus with renal manifestations (HCC)   HLD (hyperlipidemia)   HIV (human immunodeficiency virus infection) (HCC)   Elevated lactic acid level   Anemia in chronic kidney disease (CKD)   Tobacco use   Protein-calorie malnutrition, severe   AKI (acute kidney injury) (HCC)   Hematuria  Assessment and Plan: AKI on CKDIIIb: w/ bilateral hydronephrosis due to acute urinary retention & has chronic foley obstructed with blood. S/p cystoscopy with clot evacuation 8/14. Nephrostomy tubes were recommended but at this time are not pursuing further invasive treatments. Cr is labile    Urothelial carcinoma/squamous cell carcinoma of bladder: per pathology report from biopsies taken during cystoscopy. Poor prognosis. Not offering treatment currently due to low performance status, severe malnutrition & multiple comorbidities as per onco. Will f/u outpatient w/ onco for further assessment    UTI: urine cx growing klebsiella pneumoniae and e faecalis. Will complete IV unasyn  course today     Hematuria: CT shows large clot within the bladder causing obstruction, diffuse bladder wall thickening. Continue w/ foley. Holding eliquis     Acute blood loss anemia: w/ hx of ACD. Likely secondary to hematuria. S/p 2 units of pRBCs transfused so far. Complete venofer  as per onco. H&H are trending down. Will transfuse if Hb < 7.0    Prostate cancer: s/p external beam radiation, completed in June 2025.   Chronic urinary retention: continue w/ foley.  Catheter exchanged every 4 weeks in the urology clinic. Continue on home dose of finasteride , tamsulosin   DVT left leg and pulmonary embolism: holding home dose of eliquis  secondary to anemia & hematuria   Hx of CAD: w/ elevated troponins, likely secondary to demand ischemia. Continue on statin   HLD: continue on statin   HTN: holding home dose of losartan  secondary to AKI on CKD  DM2: HbA1c 6.3, well-controlled. Continue on SSI w/ accuchecks    HIV: continue on home dose of juluca      Tobacco use: nicotine  patch to prevent w/drawls          DVT prophylaxis: SCDs Code Status: DNR Family Communication:  Disposition Plan: possibly d/c to SNF  Level of care: Telemetry Medical  Status is: Inpatient Remains inpatient appropriate because: severity of illness    Consultants:  Uro Onco   Procedures:   Antimicrobials: Unasyn    Subjective: Pt c/o fatigue   Objective: Vitals:   09/26/23 1734 09/26/23 2115 09/27/23 0507 09/27/23 0759  BP: 135/81 (!) 153/77 (!) 141/89 130/84  Pulse: 71 80 84 80  Resp: 18 18 (!) 24 17  Temp: 98.2 F (36.8 C) 98.6 F (37 C) 98.2 F (36.8 C) 98.3 F (36.8 C)  TempSrc:  Oral    SpO2: 100% 100% 100% 99%  Weight:      Height:        Intake/Output Summary (Last 24 hours) at 09/27/2023 0928 Last data filed at 09/27/2023 0341 Gross per 24 hour  Intake 720 ml  Output 5850 ml  Net -5130 ml   Filed Weights   09/16/23  1345 09/19/23 1312  Weight: 47.6 kg 47.6 kg    Examination:  General exam: appears comfortable  Respiratory system: diminished breath sounds b/l Cardiovascular system: S1 & S2+. No rubs or clicks  Gastrointestinal system: abd is soft, NT, ND & hypoactive bowel sounds Central nervous system: alert & awak Psychiatry: judgement and insight appears poor. Flat mood and affect    Data Reviewed: I have personally reviewed following labs and imaging studies  CBC: Recent Labs  Lab 09/21/23 0546 09/22/23 0428  09/24/23 0755 09/26/23 0437 09/27/23 0610  WBC 11.3* 10.7* 10.7* 11.5* 13.3*  HGB 8.2* 8.3* 8.1* 7.4* 7.2*  HCT 26.1* 26.9* 26.5* 23.8* 23.3*  MCV 86.7 88.2 89.2 89.5 88.3  PLT 178 188 209 210 200   Basic Metabolic Panel: Recent Labs  Lab 09/21/23 0546 09/22/23 0428 09/24/23 0755 09/26/23 0437 09/27/23 0610  NA 136 137 139 139 137  K 4.1 4.5 4.6 4.4 4.4  CL 105 104 105 108 105  CO2 24 23 24 23  20*  GLUCOSE 157* 143* 135* 137* 156*  BUN 54* 52* 41* 32* 34*  CREATININE 3.41* 3.10* 3.16* 2.75* 3.07*  CALCIUM  8.9 8.7* 8.6* 8.7* 8.4*   GFR: Estimated Creatinine Clearance: 16.8 mL/min (A) (by C-G formula based on SCr of 3.07 mg/dL (H)). Liver Function Tests: No results for input(s): AST, ALT, ALKPHOS, BILITOT, PROT, ALBUMIN  in the last 168 hours. No results for input(s): LIPASE, AMYLASE in the last 168 hours. No results for input(s): AMMONIA in the last 168 hours. Coagulation Profile: No results for input(s): INR, PROTIME in the last 168 hours. Cardiac Enzymes: No results for input(s): CKTOTAL, CKMB, CKMBINDEX, TROPONINI in the last 168 hours. BNP (last 3 results) No results for input(s): PROBNP in the last 8760 hours. HbA1C: No results for input(s): HGBA1C in the last 72 hours. CBG: Recent Labs  Lab 09/26/23 0854 09/26/23 1207 09/26/23 1730 09/26/23 2124 09/27/23 0801  GLUCAP 173* 341* 103* 405* 146*   Lipid Profile: No results for input(s): CHOL, HDL, LDLCALC, TRIG, CHOLHDL, LDLDIRECT in the last 72 hours. Thyroid Function Tests: No results for input(s): TSH, T4TOTAL, FREET4, T3FREE, THYROIDAB in the last 72 hours. Anemia Panel: No results for input(s): VITAMINB12, FOLATE, FERRITIN, TIBC, IRON , RETICCTPCT in the last 72 hours. Sepsis Labs: No results for input(s): PROCALCITON, LATICACIDVEN in the last 168 hours.  No results found for this or any previous visit (from the past 240 hours).         Radiology Studies: No results found.      Scheduled Meds:  atorvastatin   20 mg Oral QHS   Chlorhexidine  Gluconate Cloth  6 each Topical Daily   cholecalciferol   1,000 Units Oral QHS   dolutegravir -rilpivirine   1 tablet Oral Daily   finasteride   5 mg Oral Daily   folic acid   1 mg Oral Daily   insulin  aspart  0-9 Units Subcutaneous TID WC   metoprolol  succinate  100 mg Oral Daily   multivitamin with minerals  1 tablet Oral Daily   nicotine   21 mg Transdermal Daily   sodium bicarbonate   650 mg Oral BID   tamsulosin   0.4 mg Oral QPC supper   Vilazodone  HCl  20 mg Oral Daily   Continuous Infusions:  ampicillin -sulbactam (UNASYN ) IV 3 g (09/26/23 1107)   sodium chloride  irrigation Stopped (09/26/23 1030)     LOS: 11 days       Anthony CHRISTELLA Pouch, MD [facility] Pager [phone]  If 7PM-7AM, please contact night-coverage [email or web address]  09/27/2023, 9:28 AM

## 2023-09-28 DIAGNOSIS — N179 Acute kidney failure, unspecified: Secondary | ICD-10-CM | POA: Diagnosis not present

## 2023-09-28 DIAGNOSIS — N1832 Chronic kidney disease, stage 3b: Secondary | ICD-10-CM | POA: Diagnosis not present

## 2023-09-28 LAB — CBC
HCT: 23.4 % — ABNORMAL LOW (ref 39.0–52.0)
Hemoglobin: 7.3 g/dL — ABNORMAL LOW (ref 13.0–17.0)
MCH: 27.4 pg (ref 26.0–34.0)
MCHC: 31.2 g/dL (ref 30.0–36.0)
MCV: 88 fL (ref 80.0–100.0)
Platelets: 204 K/uL (ref 150–400)
RBC: 2.66 MIL/uL — ABNORMAL LOW (ref 4.22–5.81)
RDW: 16.1 % — ABNORMAL HIGH (ref 11.5–15.5)
WBC: 11.1 K/uL — ABNORMAL HIGH (ref 4.0–10.5)
nRBC: 0 % (ref 0.0–0.2)

## 2023-09-28 LAB — BASIC METABOLIC PANEL WITH GFR
Anion gap: 8 (ref 5–15)
BUN: 34 mg/dL — ABNORMAL HIGH (ref 8–23)
CO2: 21 mmol/L — ABNORMAL LOW (ref 22–32)
Calcium: 8.5 mg/dL — ABNORMAL LOW (ref 8.9–10.3)
Chloride: 105 mmol/L (ref 98–111)
Creatinine, Ser: 2.95 mg/dL — ABNORMAL HIGH (ref 0.61–1.24)
GFR, Estimated: 23 mL/min — ABNORMAL LOW (ref >60)
Glucose, Bld: 144 mg/dL — ABNORMAL HIGH (ref 70–99)
Potassium: 4.5 mmol/L (ref 3.5–5.1)
Sodium: 134 mmol/L — ABNORMAL LOW (ref 135–145)

## 2023-09-28 LAB — GLUCOSE, CAPILLARY
Glucose-Capillary: 150 mg/dL — ABNORMAL HIGH (ref 70–99)
Glucose-Capillary: 123 mg/dL — ABNORMAL HIGH (ref 70–99)

## 2023-09-28 MED ORDER — TAMSULOSIN HCL 0.4 MG PO CAPS: 0.4000 mg | ORAL_CAPSULE | Freq: Every day | ORAL | 0 refills | Status: DC

## 2023-09-28 NOTE — Discharge Summary (Signed)
 Physician Discharge Summary  [name]:[id number] DOB: 1960/12/03 DOA: 09/16/2023  PCP: [name], MD  Admit date: 09/16/2023 Discharge date: 09/28/2023  Admitted From: group home Disposition:  group home   Recommendations for Outpatient Follow-up:  Follow up with PCP in 1 week F/u w/ onco, Dr. Babara, in 1 week F/u w/ urology in 1-2 weeks  Home Health: yes Equipment/Devices:  Discharge Condition: stable  CODE STATUS:DNR Diet recommendation: Carb Modified  Brief/Interim Summary: HPI was taken from Dr. Hilma:  Jay Flores is a 63 y.o. male with medical history significant of hypertension, hyperlipidemia, diabetes mellitus, COPD, CKD-3B, HIV (CD 258 on 03/29/17 and VL 70 on 04/25/23), HIV dementia HCV (s/p of Harvoni), thrombocytopenia, prostate cancer, indwelling Foley catheter placement, iron  deficiency anemia, DVT/PE on Eliquis , who presents with lower abdominal pain and hematuria.   Patient is a poor historian, history is limited.  Per report, pt had iron  infusion in cancer center today.  He tolerated iron  transfusion okay, but after that he developed abdominal pain and chest pain.  His chest pain has resolved when I saw patient in ED.  He continues to have lower abdominal pain. He cannot characterize his abdominal pain in detail.  He denies nausea vomiting or diarrhea.  No SOB, cough, fever or chills. He has chronic indwelling Foley catheter in place. He has noted some blood in urine which is new per pt. He reports that his foley is continuing to drain, but CT scan in ED showed distended bladder with superior bladder wall diverticulum.   Data reviewed independently and ED Course: pt was found to have WBC 22.8, worsening renal function, troponin 23 --> 24, lactic acid of 5.4 --> 5.3.  Temperature normal, blood pressure 149/79, heart rate 109--> 90, RR 19, oxygen saturation 100% on room air.  Chest x-ray negative.  Patient is admitted to telemetry bed as inpatient.  Dr.  Cam of urology is consulted.   CT-abdomen/pelvis: Well distended bladder with a superior bladder wall diverticulum. Mixed echogenicity material is noted within the bladder representing air, urine and likely hyperdense thrombus. Correlate with Foley output. The over distention of the bladder contributes to the bilateral hydronephrosis which is similar to that seen on the prior exam. Diffuse bladder wall thickening is noted suspicious for underlying neoplasm. Correlate with clinical history.   Cholelithiasis without complicating factors.   Chronic pancreatitis with a 2 cm pancreatic pseudocyst in the region of the head stable from the prior study.  Discharge Diagnoses:  Principal Problem:   Acute kidney injury superimposed on stage 3b chronic kidney disease (HCC) Active Problems:   Bilateral hydronephrosis   Urinary retention   Bladder wall thickening   DVT (deep venous thrombosis)_left leg   Pulmonary embolism (HCC)   CAD (coronary artery disease)   HTN (hypertension)   Type II diabetes mellitus with renal manifestations (HCC)   HLD (hyperlipidemia)   HIV (human immunodeficiency virus infection) (HCC)   Elevated lactic acid level   Anemia in chronic kidney disease (CKD)   Tobacco use   Protein-calorie malnutrition, severe   AKI (acute kidney injury) (HCC)   Hematuria  AKI on CKDIIIb: w/ bilateral hydronephrosis due to acute urinary retention & has chronic foley obstructed with blood. S/p cystoscopy with clot evacuation 8/14. Nephrostomy tubes were recommended but at this time are not pursuing further invasive treatments. Cr is labile    Urothelial carcinoma/squamous cell carcinoma of bladder: per pathology report from biopsies taken during cystoscopy. Poor prognosis. Not offering treatment  currently due to low performance status, severe malnutrition & multiple comorbidities as per onco. Will f/u outpatient w/ onco for further assessment    UTI: urine cx growing klebsiella  pneumoniae and e faecalis. Completed abx course.     Hematuria: CT shows large clot within the bladder causing obstruction, diffuse bladder wall thickening. S/p cystoscopy with clot evacuation 8/14. Continue w/ foley. Holding eliquis  & can restart once uro, onco or PCP says it is ok to restart. Resolving    Acute blood loss anemia: w/ hx of ACD. Likely secondary to hematuria. S/p 2 units of pRBCs transfused so far. Complete venofer  as per onco. H&H are stable currently. Will transfuse if Hb < 7.0    Prostate cancer: s/p external beam radiation, completed in June 2025.   Chronic urinary retention: continue w/ foley. Catheter exchanged every 4 weeks in the urology clinic. Continue on home dose of finasteride , tamsulosin   DVT left leg and pulmonary embolism: holding home dose of eliquis  secondary to anemia & hematuria   Hx of CAD: w/ elevated troponins, likely secondary to demand ischemia. Continue on statin   HLD: continue on statin   HTN: losartan  was d/c   DM2: HbA1c 6.3, well-controlled. Restart home anti-DM meds at discharge    HIV: continue on home dose of juluca      Tobacco use: nicotine  patch to prevent w/drawls   Discharge Instructions  Discharge Instructions     Diet general   Complete by: As directed    Discharge instructions   Complete by: As directed    F/u w/ PCP in 1 week. F/u w/ onco, Dr. Babara, in 1 week. F/u w/ urology, in 1-2 weeks   Increase activity slowly   Complete by: As directed    No wound care   Complete by: As directed       Allergies as of 09/28/2023   No Known Allergies      Medication List     PAUSE taking these medications    apixaban  5 MG Tabs tablet Wait to take this until your doctor or other care provider tells you to start again. Hold until urology, onco or PCP says it is ok to restart Commonly known as: Eliquis  Take 1 tablet (5 mg total) by mouth 2 (two) times daily.       STOP taking these medications    amoxicillin  500 MG  capsule Commonly known as: AMOXIL    losartan  25 MG tablet Commonly known as: COZAAR    neomycin-bacitracin-polymyxin [phone] Oint   nicotine  21 mg/24hr patch Commonly known as: NICODERM CQ  - dosed in mg/24 hours       TAKE these medications    Accu-Chek Aviva Plus test strip Generic drug: glucose blood 1 each by Other route in the morning, at noon, and at bedtime. Use as instructed   Accu-Chek Softclix Lancets lancets by Other route. Use as instructed   atorvastatin  20 MG tablet Commonly known as: LIPITOR Take 20 mg by mouth at bedtime.   cholecalciferol  25 MCG (1000 UNIT) tablet Commonly known as: VITAMIN D3 Take 1,000 Units by mouth at bedtime.   finasteride  5 MG tablet Commonly known as: PROSCAR  Take 5 mg by mouth daily.   folic acid  1 MG tablet Commonly known as: FOLVITE  Take 1 tablet (1 mg total) by mouth daily.   FreeStyle Libre 14 Day Reader Espiridion Apply topically.   FreeStyle Libre 14 Day Sensor Misc Apply topically.   Jardiance 25 MG Tabs tablet Generic drug: empagliflozin Take 25  mg by mouth in the morning.   Juluca  50-25 MG tablet Generic drug: dolutegravir -rilpivirine  Take 1 tablet by mouth daily.   loperamide  2 MG capsule Commonly known as: IMODIUM  Take 2 mg by mouth 4 (four) times daily as needed for diarrhea or loose stools.   metFORMIN 500 MG tablet Commonly known as: GLUCOPHAGE Take 500 mg by mouth daily with breakfast.   metoprolol  succinate 100 MG 24 hr tablet Commonly known as: TOPROL -XL Take 100 mg by mouth daily.   mirtazapine 7.5 MG tablet Commonly known as: REMERON Take 7.5 mg by mouth at bedtime.   sodium bicarbonate  650 MG tablet Take 650 mg by mouth 2 (two) times daily.   sucralfate 1 g tablet Commonly known as: CARAFATE Take 1 g by mouth 2 (two) times daily.   tamsulosin  0.4 MG Caps capsule Commonly known as: FLOMAX  Take 1 capsule (0.4 mg total) by mouth daily after supper.   Tylenol  8 Hour Arthritis Pain  650 MG CR tablet Generic drug: acetaminophen  Take 650 mg by mouth every 4 (four) hours as needed for pain.   Vilazodone  HCl 20 MG Tabs Take 20 mg by mouth daily.        Follow-up Information     [name], MD Follow up.   Specialty: Infectious Diseases Why: hospital follow up Contact information: [address] [place] [address] [phone]                No Known Allergies  Consultations: Uro Onco Palliative care   Procedures/Studies: DG OR UROLOGY CYSTO IMAGE ([facility] ONLY) Result Date: 09/19/2023 There is no interpretation for this exam.  This order is for images obtained during a surgical procedure.  Please See Surgeries Tab for more information regarding the procedure.   US  RENAL Result Date: 09/18/2023 CLINICAL DATA:  Gross hematuria. EXAM: RENAL / URINARY TRACT ULTRASOUND COMPLETE COMPARISON:  None Available. FINDINGS: Right Kidney: Renal measurements: 9.1 cm x 5.2 cm x 6.0 cm = volume: 148 mL. Diffusely increased echogenicity of the renal parenchyma is seen. No mass is visualized. There is moderate severity right-sided hydronephrosis. Left Kidney: Renal measurements: 9.5 cm x 5.4 cm x 4.1 cm = volume: 111.6 mL. Diffusely increased echogenicity of the renal parenchyma is seen. 0.9 cm x 0.6 cm x 1.1 cm and 1.8 cm x 1.4 cm x 1.6 cm left renal cysts are noted. There is mild left-sided hydronephrosis. Bladder: A Foley catheter is in place. Large amount of air is seen within the bladder lumen. Other: None. IMPRESSION: 1. Bilateral echogenic kidneys which may represent sequelae associated with medical renal disease. 2. Small left renal cyst. 3. Bilateral hydronephrosis, right greater than left. 4. Large amount of air within the bladder lumen. Electronically Signed   By: Suzen Dials M.D.   On: 09/18/2023 20:55   CT ABDOMEN PELVIS WO CONTRAST Result Date: 09/16/2023 CLINICAL DATA:  Chest and abdominal pain EXAM: CT ABDOMEN AND PELVIS WITHOUT  CONTRAST TECHNIQUE: Multidetector CT imaging of the abdomen and pelvis was performed following the standard protocol without IV contrast. RADIATION DOSE REDUCTION: This exam was performed according to the departmental dose-optimization program which includes automated exposure control, adjustment of the mA and/or kV according to patient size and/or use of iterative reconstruction technique. COMPARISON:  08/16/2023 FINDINGS: Lower chest: Mild scarring is noted in the right lung base. Hepatobiliary: Cholelithiasis is seen without complicating factors. The liver appears within normal limits. Pancreas: Scattered calcifications are noted throughout the pancreas consistent with chronic pancreatitis. The  previously seen cystic change adjacent to the head of the pancreas and second portion of the duodenum is again noted and stable measuring approximately 2 cm. The more peripheral pancreas appears within normal limits. Spleen: Normal in size without focal abnormality. Adrenals/Urinary Tract: Adrenal glands are within normal limits. Persistent hydronephrosis and hydroureter is noted bilateral likely related to an over distended bladder. Foley catheter is noted in place there is considerable mixed attenuation density within the bladder lumen consistent with air, urine and likely thrombus. Persistent bladder wall thickening is again noted with evidence of a superior bladder diverticulum. These changes likely contribute to the degree of hydronephrosis. Stomach/Bowel: Scattered fecal material is noted throughout the colon. No obstructive or inflammatory changes of the colon are seen. The appendix is partially visualized and within normal limits. Small bowel and stomach appear within normal limits. Vascular/Lymphatic: Aortic atherosclerosis. No enlarged abdominal or pelvic lymph nodes. Previously seen lymph node adjacent to the rectum is no longer identified. Reproductive: Prostate is unremarkable. Other: No abdominal wall hernia  or abnormality. No abdominopelvic ascites. Musculoskeletal: Bilateral L5 pars defects are noted without significant anterolisthesis. No acute bony abnormality is noted. 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 Mixed echogenicity material is noted within the bladder representing air, urine and likely hyperdense thrombus. Correlate with Foley output. The over distention of the bladder contributes to the bilateral hydronephrosis which is similar to that seen on the prior exam. Diffuse bladder wall thickening is noted suspicious for underlying neoplasm. Correlate with clinical history. Cholelithiasis without complicating factors. Chronic pancreatitis with a 2 cm pancreatic pseudocyst in the region of the head stable from the prior study. Electronically Signed   By: Oneil Devonshire M.D.   On: 09/16/2023 20:25   DG Chest 1 View Result Date: 09/16/2023 CLINICAL DATA:  Chest pain. EXAM: CHEST  1 VIEW COMPARISON:  July 29, 2023 FINDINGS: The heart size and mediastinal contours are within normal limits. Mild, stable linear scarring and/or atelectasis is seen along the periphery of the right lung base. No acute infiltrate, pleural effusion or pneumothorax is identified. The visualized skeletal structures are unremarkable. IMPRESSION: Mild, stable right basilar linear scarring and/or atelectasis. Electronically Signed   By: Suzen Dials M.D.   On: 09/16/2023 14:51   CT Chest Wo Contrast Result Date: 09/05/2023 CLINICAL DATA:  Pancreatic cyst. EXAM: CT CHEST WITHOUT CONTRAST TECHNIQUE: Multidetector CT imaging of the chest was performed following the standard protocol without IV contrast. RADIATION DOSE REDUCTION: This exam was performed according to the departmental dose-optimization program which includes automated exposure control, adjustment of the mA and/or kV according to patient size and/or use of iterative reconstruction technique. COMPARISON:  04/20/2019. FINDINGS:  Cardiovascular: Ascending thoracic aorta aneurysm maximal at a diameter 4.1 cm. Ectatic aorta at the arch to 3.5 cm. Atheromatous changes with calcifications of aorta and coronary arteries. 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 Mediastinum/Nodes: No enlarged mediastinal or axillary lymph nodes. Thyroid gland, trachea, and esophagus demonstrate no significant findings. Lungs/Pleura: Subsegmental atelectasis or scarring in the right middle and lower lobes laterally. Right middle lobe nodule measuring 4 mm is unchanged. No pneumonia or pulmonary edema. No pneumothorax or pleural effusion. Upper Abdomen: Calcifications of partially imaged pancreas consistent with chronic pancreatitis. Right-sided hydronephrosis; right kidney was only partially imaged. Musculoskeletal: No chest wall mass or suspicious bone lesions identified. IMPRESSION: 1. Right middle lobe nodule stable for several years consistent with a benign process. 2. Ascending thoracic aortic  aneurysm, 4.1 cm. 3. Chronic pancreatitis. 4. Right-sided hydronephrosis. Recommend dedicated imaging of the kidneys. 5. Aortic atherosclerosis (ICD10-I70.0). Electronically Signed   By: Fonda Field M.D.   On: 09/05/2023 09:19   (Echo, Carotid, EGD, Colonoscopy, ERCP)    Subjective: Pt c/o fatigue   Discharge Exam: Vitals:   09/28/23 0505 09/28/23 0748  BP: 134/69 (!) 143/87  Pulse: 75 73  Resp: 17 18  Temp: 98.3 F (36.8 C) 97.8 F (36.6 C)  SpO2: 100% 100%   Vitals:   09/27/23 1709 09/27/23 2033 09/28/23 0505 09/28/23 0748  BP: 124/75 137/71 134/69 (!) 143/87  Pulse: 70 76 75 73  Resp: 18 18 17 18   Temp: 98.4 F (36.9 C) 98.9 F (37.2 C) 98.3 F (36.8 C) 97.8 F (36.6 C)  TempSrc:      SpO2: 100% 100% 100% 100%  Weight:      Height:        General: Pt is alert, awake, not in acute distress Cardiovascular:  S1/S2 +, no rubs, no gallops Respiratory: CTA bilaterally, no wheezing, no  rhonchi Abdominal: Soft, NT, ND, bowel sounds + Extremities: no edema, no cyanosis    The results of significant diagnostics from this hospitalization (including imaging, microbiology, ancillary and laboratory) are listed below for reference.     Microbiology: No results found for this or any previous visit (from the past 240 hours).   Labs: BNP (last 3 results) No results for input(s): BNP in the last 8760 hours. Basic Metabolic Panel: Recent Labs  Lab 09/22/23 0428 09/24/23 0755 09/26/23 0437 09/27/23 0610 09/28/23 0559  NA 137 139 139 137 134*  K 4.5 4.6 4.4 4.4 4.5  CL 104 105 108 105 105  CO2 23 24 23  20* 21*  GLUCOSE 143* 135* 137* 156* 144*  BUN 52* 41* 32* 34* 34*  CREATININE 3.10* 3.16* 2.75* 3.07* 2.95*  CALCIUM  8.7* 8.6* 8.7* 8.4* 8.5*   Liver Function Tests: No results for input(s): AST, ALT, ALKPHOS, BILITOT, PROT, ALBUMIN  in the last 168 hours. No results for input(s): LIPASE, AMYLASE in the last 168 hours. No results for input(s): AMMONIA in the last 168 hours. CBC: Recent Labs  Lab 09/22/23 0428 09/24/23 0755 09/26/23 0437 09/27/23 0610 09/28/23 0559  WBC 10.7* 10.7* 11.5* 13.3* 11.1*  HGB 8.3* 8.1* 7.4* 7.2* 7.3*  HCT 26.9* 26.5* 23.8* 23.3* 23.4*  MCV 88.2 89.2 89.5 88.3 88.0  PLT 188 209 210 200 204   Cardiac Enzymes: No results for input(s): CKTOTAL, CKMB, CKMBINDEX, TROPONINI in the last 168 hours. BNP: Invalid input(s): POCBNP CBG: Recent Labs  Lab 09/27/23 0801 09/27/23 1212 09/27/23 1701 09/27/23 2039 09/28/23 0748  GLUCAP 146* 276* 133* 271* 150*   D-Dimer No results for input(s): DDIMER in the last 72 hours. Hgb A1c No results for input(s): HGBA1C in the last 72 hours. Lipid Profile No results for input(s): CHOL, HDL, LDLCALC, TRIG, CHOLHDL, LDLDIRECT in the last 72 hours. Thyroid function studies No results for input(s): TSH, T4TOTAL, T3FREE, THYROIDAB in the last 72  hours.  Invalid input(s): FREET3 Anemia work up No results for input(s): VITAMINB12, FOLATE, FERRITIN, TIBC, IRON , RETICCTPCT in the last 72 hours. Urinalysis    Component Value Date/Time   COLORURINE RED (A) 09/16/2023 2219   APPEARANCEUR CLOUDY (A) 09/16/2023 2219   APPEARANCEUR Cloudy (A) 02/11/2023 1123   LABSPEC  09/16/2023 2219    TEST NOT REPORTED DUE TO COLOR INTERFERENCE OF URINE PIGMENT   PHURINE  09/16/2023 2219  TEST NOT REPORTED DUE TO COLOR INTERFERENCE OF URINE PIGMENT   GLUCOSEU (A) 09/16/2023 2219    TEST NOT REPORTED DUE TO COLOR INTERFERENCE OF URINE PIGMENT   HGBUR (A) 09/16/2023 2219    TEST NOT REPORTED DUE TO COLOR INTERFERENCE OF URINE PIGMENT   BILIRUBINUR (A) 09/16/2023 2219    TEST NOT REPORTED DUE TO COLOR INTERFERENCE OF URINE PIGMENT   BILIRUBINUR Negative 02/11/2023 1123   KETONESUR (A) 09/16/2023 2219    TEST NOT REPORTED DUE TO COLOR INTERFERENCE OF URINE PIGMENT   PROTEINUR (A) 09/16/2023 2219    TEST NOT REPORTED DUE TO COLOR INTERFERENCE OF URINE PIGMENT   UROBILINOGEN 1.0 01/23/2012 0602   NITRITE (A) 09/16/2023 2219    TEST NOT REPORTED DUE TO COLOR INTERFERENCE OF URINE PIGMENT   LEUKOCYTESUR (A) 09/16/2023 2219    TEST NOT REPORTED DUE TO COLOR INTERFERENCE OF URINE PIGMENT   Sepsis Labs Recent Labs  Lab 09/24/23 0755 09/26/23 0437 09/27/23 0610 09/28/23 0559  WBC 10.7* 11.5* 13.3* 11.1*   Microbiology No results found for this or any previous visit (from the past 240 hours).   Time coordinating discharge: 33 minutes  SIGNED:   Anthony CHRISTELLA Pouch, MD  [facility] 09/28/2023, 9:56 AM Pager   If 7PM-7AM, please contact night-coverage [email or web address]

## 2023-09-28 NOTE — Plan of Care (Signed)
  Problem: Coping: Goal: Ability to adjust to condition or change in health will improve Outcome: Progressing   Problem: Fluid Volume: Goal: Ability to maintain a balanced intake and output will improve Outcome: Progressing   Problem: Health Behavior/Discharge Planning: Goal: Ability to identify and utilize available resources and services will improve Outcome: Progressing Goal: Ability to manage health-related needs will improve Outcome: Progressing

## 2023-09-28 NOTE — Progress Notes (Signed)
 Patient is not able to walk the distance required to go the bathroom, or he/she is unable to safely negotiate stairs required to access the bathroom.  A 3in1 BSC will alleviate this problem

## 2023-09-28 NOTE — TOC Progression Note (Addendum)
 Transition of Care [facility]) - Progression Note    Patient Details  Name: Jay Flores MRN: [id number] Date of Birth: 1961/02/02  Transition of [facility] CM/SW Contact  [place], [place] Phone Number: 09/28/2023, 10:15 AM  Clinical Narrative:     CSW contacted Medicaid transportation [facility] [phone]. Transportation was scheduled for discharge. Trip# [id number]   Medical team notified. Group home contacted and notified. CSW spoke with Ms. Lennette Dade, owner of the group home.   2:17pm: Patient discharged via taxi. Group Home owner, Lennette Dade, scheduled transportation independently. Medicaid transportation never arrived.    Expected Discharge Plan: Group Home Barriers to Discharge: Continued Medical Work up               Expected Discharge Plan and Services   Discharge Planning Services: CM Consult   Living arrangements for the past 2 months: Group Home Expected Discharge Date: 09/28/23                                     Social Drivers of Health (SDOH) Interventions SDOH Screenings   Food Insecurity: No Food Insecurity (09/17/2023)  Housing: Low Risk  (09/17/2023)  Transportation Needs: No Transportation Needs (09/17/2023)  Utilities: Not At Risk (09/17/2023)  Depression (PHQ2-9): Low Risk  (03/23/2022)  Tobacco Use: High Risk (09/16/2023)    Readmission Risk Interventions    07/31/2023    9:41 AM  Readmission Risk Prevention Plan  Transportation Screening Complete  Medication Review (RN Care Manager) Complete  PCP or Specialist appointment within 3-5 days of discharge Complete  SW Recovery Care/Counseling Consult Complete  Palliative Care Screening Not Applicable  Skilled Nursing Facility Not Applicable

## 2023-10-01 ENCOUNTER — Telehealth: Payer: Self-pay

## 2023-10-01 DIAGNOSIS — K862 Cyst of pancreas: Secondary | ICD-10-CM

## 2023-10-01 DIAGNOSIS — D631 Anemia in chronic kidney disease: Secondary | ICD-10-CM

## 2023-10-01 DIAGNOSIS — N3289 Other specified disorders of bladder: Secondary | ICD-10-CM

## 2023-10-01 NOTE — Telephone Encounter (Signed)
 Please schedule pt for lab/MD/ [facility] Palliative (same day) the week of 9/8. Please inform assisted Living facilty of appt and ask if POA [place]) can come to appt if possible, per MD.

## 2023-10-08 ENCOUNTER — Other Ambulatory Visit: Payer: Self-pay

## 2023-10-08 ENCOUNTER — Telehealth: Payer: Self-pay

## 2023-10-08 ENCOUNTER — Encounter: Payer: Self-pay | Admitting: Emergency Medicine

## 2023-10-08 ENCOUNTER — Inpatient Hospital Stay
Admission: EM | Admit: 2023-10-08 | Discharge: 2023-10-16 | DRG: 686 | Disposition: A | Discharge status: Hospice | Source: Skilled Nursing Facility | Attending: Obstetrics and Gynecology | Admitting: Obstetrics and Gynecology

## 2023-10-08 ENCOUNTER — Inpatient Hospital Stay: Attending: Oncology

## 2023-10-08 ENCOUNTER — Emergency Department

## 2023-10-08 DIAGNOSIS — B20 Human immunodeficiency virus [HIV] disease: Secondary | ICD-10-CM | POA: Diagnosis present

## 2023-10-08 DIAGNOSIS — Z7901 Long term (current) use of anticoagulants: Secondary | ICD-10-CM

## 2023-10-08 DIAGNOSIS — D649 Anemia, unspecified: Secondary | ICD-10-CM

## 2023-10-08 DIAGNOSIS — E43 Unspecified severe protein-calorie malnutrition: Secondary | ICD-10-CM | POA: Diagnosis present

## 2023-10-08 DIAGNOSIS — Z794 Long term (current) use of insulin: Secondary | ICD-10-CM | POA: Diagnosis not present

## 2023-10-08 DIAGNOSIS — Z936 Other artificial openings of urinary tract status: Secondary | ICD-10-CM | POA: Diagnosis not present

## 2023-10-08 DIAGNOSIS — E1129 Type 2 diabetes mellitus with other diabetic kidney complication: Secondary | ICD-10-CM | POA: Diagnosis present

## 2023-10-08 DIAGNOSIS — N179 Acute kidney failure, unspecified: Principal | ICD-10-CM | POA: Diagnosis present

## 2023-10-08 DIAGNOSIS — Z79899 Other long term (current) drug therapy: Secondary | ICD-10-CM

## 2023-10-08 DIAGNOSIS — R64 Cachexia: Secondary | ICD-10-CM | POA: Diagnosis present

## 2023-10-08 DIAGNOSIS — D631 Anemia in chronic kidney disease: Secondary | ICD-10-CM | POA: Diagnosis present

## 2023-10-08 DIAGNOSIS — Z7189 Other specified counseling: Secondary | ICD-10-CM | POA: Diagnosis not present

## 2023-10-08 DIAGNOSIS — C679 Malignant neoplasm of bladder, unspecified: Principal | ICD-10-CM | POA: Diagnosis present

## 2023-10-08 DIAGNOSIS — N401 Enlarged prostate with lower urinary tract symptoms: Secondary | ICD-10-CM | POA: Diagnosis present

## 2023-10-08 DIAGNOSIS — Z86718 Personal history of other venous thrombosis and embolism: Secondary | ICD-10-CM

## 2023-10-08 DIAGNOSIS — E785 Hyperlipidemia, unspecified: Secondary | ICD-10-CM | POA: Diagnosis present

## 2023-10-08 DIAGNOSIS — E1122 Type 2 diabetes mellitus with diabetic chronic kidney disease: Secondary | ICD-10-CM | POA: Diagnosis present

## 2023-10-08 DIAGNOSIS — B1920 Unspecified viral hepatitis C without hepatic coma: Secondary | ICD-10-CM | POA: Diagnosis present

## 2023-10-08 DIAGNOSIS — E875 Hyperkalemia: Secondary | ICD-10-CM | POA: Diagnosis not present

## 2023-10-08 DIAGNOSIS — E8721 Acute metabolic acidosis: Secondary | ICD-10-CM | POA: Diagnosis present

## 2023-10-08 DIAGNOSIS — N184 Chronic kidney disease, stage 4 (severe): Secondary | ICD-10-CM | POA: Diagnosis present

## 2023-10-08 DIAGNOSIS — F0280 Dementia in other diseases classified elsewhere without behavioral disturbance: Secondary | ICD-10-CM | POA: Diagnosis present

## 2023-10-08 DIAGNOSIS — N1339 Other hydronephrosis: Secondary | ICD-10-CM | POA: Diagnosis not present

## 2023-10-08 DIAGNOSIS — N183 Chronic kidney disease, stage 3 unspecified: Secondary | ICD-10-CM | POA: Diagnosis present

## 2023-10-08 DIAGNOSIS — N1832 Chronic kidney disease, stage 3b: Secondary | ICD-10-CM | POA: Diagnosis present

## 2023-10-08 DIAGNOSIS — N189 Chronic kidney disease, unspecified: Secondary | ICD-10-CM | POA: Diagnosis present

## 2023-10-08 DIAGNOSIS — Z923 Personal history of irradiation: Secondary | ICD-10-CM

## 2023-10-08 DIAGNOSIS — Z21 Asymptomatic human immunodeficiency virus [HIV] infection status: Secondary | ICD-10-CM | POA: Diagnosis not present

## 2023-10-08 DIAGNOSIS — Z789 Other specified health status: Secondary | ICD-10-CM | POA: Diagnosis not present

## 2023-10-08 DIAGNOSIS — I129 Hypertensive chronic kidney disease with stage 1 through stage 4 chronic kidney disease, or unspecified chronic kidney disease: Secondary | ICD-10-CM | POA: Diagnosis present

## 2023-10-08 DIAGNOSIS — Z8546 Personal history of malignant neoplasm of prostate: Secondary | ICD-10-CM

## 2023-10-08 DIAGNOSIS — Z515 Encounter for palliative care: Secondary | ICD-10-CM | POA: Diagnosis not present

## 2023-10-08 DIAGNOSIS — R54 Age-related physical debility: Secondary | ICD-10-CM | POA: Diagnosis present

## 2023-10-08 DIAGNOSIS — E876 Hypokalemia: Secondary | ICD-10-CM | POA: Diagnosis not present

## 2023-10-08 DIAGNOSIS — Z66 Do not resuscitate: Secondary | ICD-10-CM | POA: Diagnosis present

## 2023-10-08 DIAGNOSIS — J449 Chronic obstructive pulmonary disease, unspecified: Secondary | ICD-10-CM | POA: Diagnosis present

## 2023-10-08 DIAGNOSIS — F17210 Nicotine dependence, cigarettes, uncomplicated: Secondary | ICD-10-CM | POA: Diagnosis present

## 2023-10-08 DIAGNOSIS — D630 Anemia in neoplastic disease: Secondary | ICD-10-CM | POA: Diagnosis present

## 2023-10-08 DIAGNOSIS — Z833 Family history of diabetes mellitus: Secondary | ICD-10-CM

## 2023-10-08 DIAGNOSIS — N1330 Unspecified hydronephrosis: Secondary | ICD-10-CM | POA: Diagnosis present

## 2023-10-08 DIAGNOSIS — N3289 Other specified disorders of bladder: Secondary | ICD-10-CM | POA: Diagnosis not present

## 2023-10-08 DIAGNOSIS — R972 Elevated prostate specific antigen [PSA]: Secondary | ICD-10-CM | POA: Diagnosis present

## 2023-10-08 DIAGNOSIS — Z681 Body mass index (BMI) 19 or less, adult: Secondary | ICD-10-CM | POA: Diagnosis not present

## 2023-10-08 DIAGNOSIS — C61 Malignant neoplasm of prostate: Secondary | ICD-10-CM

## 2023-10-08 DIAGNOSIS — M159 Polyosteoarthritis, unspecified: Secondary | ICD-10-CM | POA: Diagnosis present

## 2023-10-08 DIAGNOSIS — I2699 Other pulmonary embolism without acute cor pulmonale: Secondary | ICD-10-CM | POA: Diagnosis present

## 2023-10-08 DIAGNOSIS — N136 Pyonephrosis: Secondary | ICD-10-CM | POA: Diagnosis present

## 2023-10-08 DIAGNOSIS — E1140 Type 2 diabetes mellitus with diabetic neuropathy, unspecified: Secondary | ICD-10-CM | POA: Diagnosis present

## 2023-10-08 DIAGNOSIS — I82409 Acute embolism and thrombosis of unspecified deep veins of unspecified lower extremity: Secondary | ICD-10-CM | POA: Diagnosis present

## 2023-10-08 DIAGNOSIS — Z7984 Long term (current) use of oral hypoglycemic drugs: Secondary | ICD-10-CM

## 2023-10-08 DIAGNOSIS — Z86711 Personal history of pulmonary embolism: Secondary | ICD-10-CM

## 2023-10-08 DIAGNOSIS — I10 Essential (primary) hypertension: Secondary | ICD-10-CM | POA: Diagnosis present

## 2023-10-08 DIAGNOSIS — Z8551 Personal history of malignant neoplasm of bladder: Secondary | ICD-10-CM

## 2023-10-08 LAB — COMPREHENSIVE METABOLIC PANEL WITH GFR
ALT: 7 U/L (ref 0–44)
AST: 15 U/L (ref 15–41)
Albumin: 2.2 g/dL — ABNORMAL LOW (ref 3.5–5.0)
Alkaline Phosphatase: 84 U/L (ref 38–126)
Anion gap: 16 — ABNORMAL HIGH (ref 5–15)
BUN: 97 mg/dL — ABNORMAL HIGH (ref 8–23)
CO2: 19 mmol/L — ABNORMAL LOW (ref 22–32)
Calcium: 8.9 mg/dL (ref 8.9–10.3)
Chloride: 100 mmol/L (ref 98–111)
Creatinine, Ser: 13.76 mg/dL — ABNORMAL HIGH (ref 0.61–1.24)
GFR, Estimated: 4 mL/min — ABNORMAL LOW (ref >60)
Glucose, Bld: 115 mg/dL — ABNORMAL HIGH (ref 70–99)
Potassium: 5.3 mmol/L — ABNORMAL HIGH (ref 3.5–5.1)
Sodium: 135 mmol/L (ref 135–145)
Total Bilirubin: 0.5 mg/dL (ref 0.0–1.2)
Total Protein: 6.9 g/dL (ref 6.5–8.1)

## 2023-10-08 LAB — CBC
HCT: 27.4 % — ABNORMAL LOW (ref 39.0–52.0)
Hemoglobin: 8.1 g/dL — ABNORMAL LOW (ref 13.0–17.0)
MCH: 26.6 pg (ref 26.0–34.0)
MCHC: 29.6 g/dL — ABNORMAL LOW (ref 30.0–36.0)
MCV: 90.1 fL (ref 80.0–100.0)
Platelets: 251 K/uL (ref 150–400)
RBC: 3.04 MIL/uL — ABNORMAL LOW (ref 4.22–5.81)
RDW: 15.9 % — ABNORMAL HIGH (ref 11.5–15.5)
WBC: 11.1 K/uL — ABNORMAL HIGH (ref 4.0–10.5)
nRBC: 0 % (ref 0.0–0.2)

## 2023-10-08 LAB — CBC WITH DIFFERENTIAL (CANCER CENTER ONLY)
Abs Immature Granulocytes: 0.07 K/uL (ref 0.00–0.07)
Basophils Absolute: 0 K/uL (ref 0.0–0.1)
Basophils Relative: 0 %
Eosinophils Absolute: 0.1 K/uL (ref 0.0–0.5)
Eosinophils Relative: 1 %
HCT: 27.1 % — ABNORMAL LOW (ref 39.0–52.0)
Hemoglobin: 8.2 g/dL — ABNORMAL LOW (ref 13.0–17.0)
Immature Granulocytes: 1 %
Lymphocytes Relative: 16 %
Lymphs Abs: 1.7 K/uL (ref 0.7–4.0)
MCH: 27.2 pg (ref 26.0–34.0)
MCHC: 30.3 g/dL (ref 30.0–36.0)
MCV: 89.7 fL (ref 80.0–100.0)
Monocytes Absolute: 0.5 K/uL (ref 0.1–1.0)
Monocytes Relative: 5 %
Neutro Abs: 8.6 K/uL — ABNORMAL HIGH (ref 1.7–7.7)
Neutrophils Relative %: 77 %
Platelet Count: 266 K/uL (ref 150–400)
RBC: 3.02 MIL/uL — ABNORMAL LOW (ref 4.22–5.81)
RDW: 15.8 % — ABNORMAL HIGH (ref 11.5–15.5)
WBC Count: 11.1 K/uL — ABNORMAL HIGH (ref 4.0–10.5)
nRBC: 0 % (ref 0.0–0.2)

## 2023-10-08 LAB — RETIC PANEL
Immature Retic Fract: 10.9 % (ref 2.3–15.9)
RBC.: 2.98 MIL/uL — ABNORMAL LOW (ref 4.22–5.81)
Retic Count, Absolute: 15.5 K/uL — ABNORMAL LOW (ref 19.0–186.0)
Retic Ct Pct: 0.5 % (ref 0.4–3.1)
Reticulocyte Hemoglobin: 29.3 pg (ref >27.9)

## 2023-10-08 LAB — IRON AND TIBC
Iron: 27 ug/dL — ABNORMAL LOW (ref 45–182)
Saturation Ratios: 19 % (ref 17.9–39.5)
TIBC: 144 ug/dL — ABNORMAL LOW (ref 250–450)
UIBC: 117 ug/dL

## 2023-10-08 LAB — URINALYSIS, W/ REFLEX TO CULTURE (INFECTION SUSPECTED)
Bacteria, UA: NONE SEEN
Bilirubin Urine: NEGATIVE
Glucose, UA: NEGATIVE mg/dL
Ketones, ur: NEGATIVE mg/dL
Nitrite: NEGATIVE
Protein, ur: 30 mg/dL — AB
RBC / HPF: >50 RBC/hpf (ref 0–5)
Specific Gravity, Urine: 1.024 (ref 1.005–1.030)
Squamous Epithelial / HPF: 0 /HPF (ref 0–5)
WBC, UA: >50 WBC/hpf (ref 0–5)
pH: 6 (ref 5.0–8.0)

## 2023-10-08 LAB — CMP (CANCER CENTER ONLY)
ALT: 7 U/L (ref 0–44)
AST: 12 U/L — ABNORMAL LOW (ref 15–41)
Albumin: 2.3 g/dL — ABNORMAL LOW (ref 3.5–5.0)
Alkaline Phosphatase: 86 U/L (ref 38–126)
Anion gap: 14 (ref 5–15)
BUN: 99 mg/dL — ABNORMAL HIGH (ref 8–23)
CO2: 19 mmol/L — ABNORMAL LOW (ref 22–32)
Calcium: 9 mg/dL (ref 8.9–10.3)
Chloride: 99 mmol/L (ref 98–111)
Creatinine: 13.9 mg/dL (ref 0.61–1.24)
GFR, Estimated: 4 mL/min — ABNORMAL LOW (ref >60)
Glucose, Bld: 117 mg/dL — ABNORMAL HIGH (ref 70–99)
Potassium: 5.7 mmol/L — ABNORMAL HIGH (ref 3.5–5.1)
Sodium: 132 mmol/L — ABNORMAL LOW (ref 135–145)
Total Bilirubin: 0.4 mg/dL (ref 0.0–1.2)
Total Protein: 7.1 g/dL (ref 6.5–8.1)

## 2023-10-08 LAB — GLUCOSE, CAPILLARY
Glucose-Capillary: 121 mg/dL — ABNORMAL HIGH (ref 70–99)
Glucose-Capillary: 154 mg/dL — ABNORMAL HIGH (ref 70–99)

## 2023-10-08 LAB — FERRITIN: Ferritin: 540 ng/mL — ABNORMAL HIGH (ref 24–336)

## 2023-10-08 LAB — SAMPLE TO BLOOD BANK

## 2023-10-08 LAB — PSA: Prostatic Specific Antigen: 0.72 ng/mL (ref 0.00–4.00)

## 2023-10-08 MED ORDER — ONDANSETRON HCL 4 MG PO TABS
4.0000 mg | ORAL_TABLET | Freq: Four times a day (QID) | ORAL | Status: DC | PRN
  Filled 2023-10-08: qty 1

## 2023-10-08 MED ORDER — INSULIN ASPART 100 UNIT/ML IJ SOLN
0.0000 [IU] | Freq: Three times a day (TID) | INTRAMUSCULAR | Status: DC
  Administered 2023-10-10 – 2023-10-13 (×5): 1 [IU] via SUBCUTANEOUS
  Administered 2023-10-14: 2 [IU] via SUBCUTANEOUS
  Administered 2023-10-15: 1 [IU] via SUBCUTANEOUS
  Filled 2023-10-08 (×9): qty 1

## 2023-10-08 MED ORDER — SUCRALFATE 1 G PO TABS
1.0000 g | ORAL_TABLET | Freq: Two times a day (BID) | ORAL | Status: DC
  Administered 2023-10-08 – 2023-10-16 (×16): 1 g via ORAL
  Filled 2023-10-08 (×16): qty 1

## 2023-10-08 MED ORDER — ACETAMINOPHEN 325 MG PO TABS: 650.0000 mg | ORAL_TABLET | Freq: Four times a day (QID) | ORAL | Status: DC | PRN

## 2023-10-08 MED ORDER — TAMSULOSIN HCL 0.4 MG PO CAPS
0.4000 mg | ORAL_CAPSULE | Freq: Every day | ORAL | Status: DC
  Administered 2023-10-08 – 2023-10-15 (×8): 0.4 mg via ORAL
  Filled 2023-10-08 (×8): qty 1

## 2023-10-08 MED ORDER — MORPHINE SULFATE (PF) 2 MG/ML IV SOLN
2.0000 mg | INTRAVENOUS | Status: DC | PRN
  Administered 2023-10-10 – 2023-10-12 (×8): 2 mg via INTRAVENOUS
  Filled 2023-10-08 (×8): qty 1

## 2023-10-08 MED ORDER — ONDANSETRON HCL 4 MG/2ML IJ SOLN: 4.0000 mg | Freq: Four times a day (QID) | INTRAMUSCULAR | Status: DC | PRN

## 2023-10-08 MED ORDER — DOLUTEGRAVIR-RILPIVIRINE 50-25 MG PO TABS
1.0000 | ORAL_TABLET | Freq: Every day | ORAL | Status: DC
  Administered 2023-10-08 – 2023-10-16 (×9): 1 via ORAL
  Filled 2023-10-08 (×9): qty 1

## 2023-10-08 MED ORDER — MIRTAZAPINE 15 MG PO TABS
7.5000 mg | ORAL_TABLET | Freq: Every day | ORAL | Status: DC
  Administered 2023-10-08 – 2023-10-15 (×8): 7.5 mg via ORAL
  Filled 2023-10-08 (×8): qty 1

## 2023-10-08 MED ORDER — METOPROLOL SUCCINATE ER 50 MG PO TB24
100.0000 mg | ORAL_TABLET | Freq: Every day | ORAL | Status: DC
Start: 2023-10-09 — End: 2023-10-16
  Administered 2023-10-09 – 2023-10-16 (×8): 100 mg via ORAL
  Filled 2023-10-08 (×9): qty 2

## 2023-10-08 MED ORDER — OXYCODONE HCL 5 MG PO TABS
5.0000 mg | ORAL_TABLET | ORAL | Status: DC | PRN
  Administered 2023-10-09 – 2023-10-14 (×3): 5 mg via ORAL
  Filled 2023-10-08 (×4): qty 1

## 2023-10-08 MED ORDER — POLYETHYLENE GLYCOL 3350 17 G PO PACK
17.0000 g | PACK | Freq: Every day | ORAL | Status: DC | PRN
  Administered 2023-10-13 – 2023-10-14 (×2): 17 g via ORAL
  Filled 2023-10-08 (×2): qty 1

## 2023-10-08 MED ORDER — ACETAMINOPHEN 650 MG RE SUPP: 650.0000 mg | Freq: Four times a day (QID) | RECTAL | Status: DC | PRN

## 2023-10-08 MED ORDER — SODIUM CHLORIDE 0.9 % IV SOLN
1.0000 g | INTRAVENOUS | Status: AC
  Administered 2023-10-08: 1 g via INTRAVENOUS
  Filled 2023-10-08: qty 10

## 2023-10-08 MED ORDER — SODIUM BICARBONATE 650 MG PO TABS
650.0000 mg | ORAL_TABLET | Freq: Two times a day (BID) | ORAL | Status: DC
  Administered 2023-10-08 – 2023-10-12 (×8): 650 mg via ORAL
  Filled 2023-10-08 (×8): qty 1

## 2023-10-08 MED ORDER — FINASTERIDE 5 MG PO TABS
5.0000 mg | ORAL_TABLET | Freq: Every day | ORAL | Status: DC
  Administered 2023-10-09 – 2023-10-16 (×8): 5 mg via ORAL
  Filled 2023-10-08 (×8): qty 1

## 2023-10-08 MED ORDER — FOLIC ACID 1 MG PO TABS
1.0000 mg | ORAL_TABLET | Freq: Every day | ORAL | Status: DC
Start: 2023-10-09 — End: 2023-10-16
  Administered 2023-10-09 – 2023-10-16 (×8): 1 mg via ORAL
  Filled 2023-10-08 (×8): qty 1

## 2023-10-08 MED ORDER — HEPARIN SODIUM (PORCINE) 5000 UNIT/ML IJ SOLN
5000.0000 [IU] | Freq: Three times a day (TID) | INTRAMUSCULAR | Status: DC
  Administered 2023-10-08 – 2023-10-16 (×23): 5000 [IU] via SUBCUTANEOUS
  Filled 2023-10-08 (×23): qty 1

## 2023-10-08 NOTE — Consult Note (Signed)
 Urology Consult  I have been asked to see the patient by Dr. Ernest, for evaluation and management of acute renal failure.  Chief Complaint: Abnormal labs  History of Present Illness: Jay Flores is a 63 y.o. 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  CT stone study on arrival is pending radiology report, however on personal review I note stable appearing bilateral hydronephrosis and stable appearing gas within the bladder lumen.  He reports 2 days of diffuse abdominal pain.  Foley catheter in place draining milky, yellow urine.  Notably, patient does not make his own medical decisions.  Decision making authority is shared between his 2 sisters and the owner of his group home, see prior palliative care notes.  Past Medical History:  Diagnosis Date   Acute renal failure (HCC)    Altered mental status 01/20/2012   Anemia    Brain lesion    Cholelithiasis    Chronic indwelling Foley catheter    CKD (chronic kidney disease) stage 3, GFR 30-59 ml/min (HCC)    COPD (chronic obstructive pulmonary disease) (HCC)    Diabetic neuropathy (HCC)    DKA (diabetic ketoacidosis) (HCC)    DM (diabetes mellitus), type 2 (HCC)    Elevated PSA    Gait instability    uses walker   H/O urinary retention    Hepatitis C infection    s/p Harvoni   History of cocaine use    last used in 2012   HIV (human immunodeficiency virus infection) (HCC)    HIV dementia (HCC)    Hypertension    IPMN (intraductal papillary mucinous neoplasm)    Lives in group home    [facility]   Metabolic acidosis     Pancreatic cyst    Pneumonia    Thrombocytopenia (HCC)    Tobacco use     Past Surgical History:  Procedure Laterality Date   COLONOSCOPY WITH PROPOFOL  N/A 09/05/2022   Procedure: COLONOSCOPY WITH PROPOFOL ;  Surgeon: Toledo, Ladell POUR, MD;  Location: [facility] ENDOSCOPY;  Service: Gastroenterology;  Laterality: N/A;   CYSTOSCOPY  09/19/2023   Procedure: CYSTOSCOPY;  Surgeon: Twylla Glendia BROCKS, MD;  Location: [facility] ORS;  Service: Urology;;   EUS N/A 04/19/2022   Procedure: UPPER ENDOSCOPIC ULTRASOUND (EUS) LINEAR;  Surgeon: Elta Fonda SQUIBB, MD;  Location: [facility] ENDOSCOPY;  Service: Gastroenterology;  Laterality: N/A;  Requesting 1p slot   HEMOSTASIS CLIP PLACEMENT  09/05/2022   Procedure: HEMOSTASIS CLIP PLACEMENT;  Surgeon: Aundria, Ladell POUR, MD;  Location[facility] ENDOSCOPY;  Service: Gastroenterology;;   POLYPECTOMY  09/05/2022   Procedure: POLYPECTOMY;  Surgeon: Aundria, Ladell POUR, MD;  Location: [facility] ENDOSCOPY;  Service: Gastroenterology;;   PROSTATE BIOPSY N/A 02/26/2023   Procedure: PROSTATE BIOPSY;  Surgeon: Twylla Glendia BROCKS, MD;  Location: [facility] ORS;  Service: Urology;  Laterality: N/A;   TRANSRECTAL ULTRASOUND N/A 02/26/2023   Procedure: TRANSRECTAL ULTRASOUND;  Surgeon: Twylla Glendia BROCKS, MD;  Location: [facility] ORS;  Service: Urology;  Laterality: N/A;   TRANSURETHRAL RESECTION OF BLADDER TUMOR  09/19/2023   Procedure: TURBT (TRANSURETHRAL RESECTION OF BLADDER TUMOR);  Surgeon: Twylla Glendia BROCKS, MD;  Location: [facility] ORS;  Service: Urology;;    Home Medications:  No outpatient medications have been marked as taking for the 10/08/23 encounter [facility] Encounter).    Allergies: No Known Allergies  Family History  Problem Relation Age of Onset   Diabetes Mellitus II Mother     Social History:  reports that he has been smoking cigarettes. He has a 34 pack-year smoking history. He has been exposed to tobacco smoke. He has never used smokeless tobacco. He reports that he does not currently use  drugs after having used the following drugs: Cocaine. He reports that he does not drink alcohol.  ROS: A complete review of systems was performed.  All systems are negative except for pertinent findings as noted.  Physical Exam:  Vital signs in last 24 hours: Temp:  [97.6 F (36.4 C)] 97.6 F (36.4 C) (09/02 1344) Pulse Rate:  [74-84] 83 (09/02 1530) Resp:  [16-18] 18 (09/02 1530) BP: (122-145)/(80-93) 122/93 (09/02 1530) SpO2:  [100 %] 100 % (09/02 1530) Weight:  [40.1 kg] 40.1 kg (09/02 1342) Constitutional:  Alert, no acute distress, cachectic HEENT: [place] AT, moist mucus membranes Cardiovascular: No clubbing, cyanosis, or edema Respiratory: Normal respiratory effort GI: Abdomen is firm, nondistended, nontender Skin: No rashes, bruises or suspicious lesions Neurologic: Grossly intact, no focal deficits, moving all 4 extremities Psychiatric: Normal mood and affect  Laboratory Data:  Recent Labs    10/08/23 1136 10/08/23 1400  WBC 11.1* 11.1*  HGB 8.2* 8.1*  HCT 27.1* 27.4*   Recent Labs    10/08/23 1136 10/08/23 1400  NA 132* 135  K 5.7* 5.3*  CL 99 100  CO2 19* 19*  GLUCOSE 117* 115*  BUN 99* 97*  CREATININE 13.90* 13.76*  CALCIUM  9.0 8.9   Urinalysis    Component Value Date/Time   COLORURINE RED (A) 09/16/2023 2219   APPEARANCEUR CLOUDY (A) 09/16/2023 2219   APPEARANCEUR Cloudy (A) 02/11/2023 1123   LABSPEC  09/16/2023 2219    TEST NOT REPORTED DUE TO COLOR INTERFERENCE OF URINE PIGMENT   PHURINE  09/16/2023 2219    TEST NOT REPORTED DUE TO COLOR INTERFERENCE OF URINE PIGMENT   GLUCOSEU (A) 09/16/2023 2219    TEST NOT REPORTED DUE TO COLOR INTERFERENCE OF URINE PIGMENT   HGBUR (A) 09/16/2023 2219    TEST NOT REPORTED DUE TO COLOR INTERFERENCE OF URINE PIGMENT   BILIRUBINUR (A) 09/16/2023 2219    TEST NOT REPORTED DUE TO COLOR INTERFERENCE OF URINE PIGMENT   BILIRUBINUR Negative 02/11/2023 1123   KETONESUR (A) 09/16/2023 2219    TEST NOT REPORTED DUE TO  COLOR INTERFERENCE OF URINE PIGMENT   PROTEINUR (A) 09/16/2023 2219    TEST NOT REPORTED DUE TO COLOR INTERFERENCE OF URINE PIGMENT   UROBILINOGEN 1.0 01/23/2012 0602   NITRITE (A) 09/16/2023 2219    TEST NOT REPORTED DUE TO COLOR INTERFERENCE OF URINE PIGMENT   LEUKOCYTESUR (A) 09/16/2023 2219    TEST NOT REPORTED DUE TO COLOR INTERFERENCE OF URINE PIGMENT   Results for orders placed or performed during the hospital encounter of 09/16/23  Blood culture (routine x 2)     Status: None   Collection Time: 09/16/23  4:40 PM   Specimen: BLOOD  Result Value Ref Range Status   Specimen Description BLOOD BLOOD RIGHT ARM  Final   Special Requests BOTTLES DRAWN AEROBIC AND ANAEROBIC BCLV  Final   Culture   Final    NO GROWTH 5 DAYS Performed at [facility], [address]  [place], [place] [address]    Report Status 09/21/2023 FINAL  Final  Blood culture (routine x 2)     Status: None   Collection Time: 09/16/23  5:10 PM   Specimen: BLOOD  Result Value Ref Range Status   Specimen Description BLOOD BLOOD LEFT FOREARM  Final   Special Requests BOTTLES DRAWN AEROBIC AND ANAEROBIC BCLV  Final   Culture   Final    NO GROWTH 5 DAYS Performed at [facility], [address] [place], [place] [address]    Report Status 09/21/2023 FINAL  Final  Urine Culture     Status: Abnormal   Collection Time: 09/16/23 10:19 PM   Specimen: Urine, Clean Catch  Result Value Ref Range Status   Specimen Description   Final    URINE, CLEAN CATCH Performed at [facility], [address], [place], [place] [address]    Special Requests   Final    NONE Performed at [facility], [address] [place], [place] [address]    Culture (A)  Final    >=100,000 COLONIES/mL KLEBSIELLA PNEUMONIAE >=100,000 COLONIES/mL ENTEROCOCCUS FAECALIS    Report Status 09/20/2023 FINAL  Final   Organism ID, Bacteria KLEBSIELLA PNEUMONIAE (A)  Final   Organism ID, Bacteria ENTEROCOCCUS  FAECALIS (A)  Final      Susceptibility   Enterococcus faecalis - MIC*    AMPICILLIN  <=2 SENSITIVE Sensitive     NITROFURANTOIN <=16 SENSITIVE Sensitive     VANCOMYCIN 1 SENSITIVE Sensitive     * >=100,000 COLONIES/mL ENTEROCOCCUS FAECALIS   Klebsiella pneumoniae - MIC*    AMPICILLIN  >=32 RESISTANT Resistant     CEFAZOLIN  Value in next row Sensitive      2 SENSITIVEThis is a modified FDA-approved test that has been validated and its performance characteristics determined by the reporting laboratory.  This laboratory is certified under the Clinical Laboratory Improvement Amendments CLIA as qualified to perform high complexity clinical laboratory testing.    CEFEPIME  Value in next row Sensitive      2 SENSITIVEThis is a modified FDA-approved test that has been validated and its performance characteristics determined by the reporting laboratory.  This laboratory is certified under the Clinical Laboratory Improvement Amendments CLIA as qualified to perform high complexity clinical laboratory testing.    ERTAPENEM Value in next row Sensitive      2 SENSITIVEThis is a modified FDA-approved test that has been validated and its performance characteristics determined by the reporting laboratory.  This laboratory is certified under the Clinical Laboratory Improvement Amendments CLIA as qualified to perform high complexity clinical laboratory testing.    CEFTRIAXONE  Value in next row Sensitive      2 SENSITIVEThis is a modified FDA-approved test that has been validated and its performance characteristics determined by the reporting laboratory.  This laboratory is certified under the Clinical Laboratory Improvement Amendments CLIA as qualified to perform high complexity clinical laboratory testing.    CIPROFLOXACIN  Value in next row Sensitive      2 SENSITIVEThis is a modified FDA-approved test that has been validated and its performance characteristics determined by the reporting laboratory.  This laboratory  is certified under the Clinical Laboratory Improvement Amendments CLIA as qualified to perform high complexity clinical laboratory testing.    GENTAMICIN  Value in next row Sensitive      2 SENSITIVEThis is a modified FDA-approved test that has been validated and its performance characteristics determined by the reporting laboratory.  This laboratory is certified under the Clinical Laboratory  Improvement Amendments CLIA as qualified to perform high complexity clinical laboratory testing.    NITROFURANTOIN Value in next row Resistant      2 SENSITIVEThis is a modified FDA-approved test that has been validated and its performance characteristics determined by the reporting laboratory.  This laboratory is certified under the Clinical Laboratory Improvement Amendments CLIA as qualified to perform high complexity clinical laboratory testing.    TRIMETH /SULFA  Value in next row Sensitive      2 SENSITIVEThis is a modified FDA-approved test that has been validated and its performance characteristics determined by the reporting laboratory.  This laboratory is certified under the Clinical Laboratory Improvement Amendments CLIA as qualified to perform high complexity clinical laboratory testing.    AMPICILLIN /SULBACTAM Value in next row Sensitive      2 SENSITIVEThis is a modified FDA-approved test that has been validated and its performance characteristics determined by the reporting laboratory.  This laboratory is certified under the Clinical Laboratory Improvement Amendments CLIA as qualified to perform high complexity clinical laboratory testing.    PIP/TAZO Value in next row Sensitive ug/mL     <=4 SENSITIVEThis is a modified FDA-approved test that has been validated and its performance characteristics determined by the reporting laboratory.  This laboratory is certified under the Clinical Laboratory Improvement Amendments CLIA as qualified to perform high complexity clinical laboratory testing.    MEROPENEM  Value in next row Sensitive      <=4 SENSITIVEThis is a modified FDA-approved test that has been validated and its performance characteristics determined by the reporting laboratory.  This laboratory is certified under the Clinical Laboratory Improvement Amendments CLIA as qualified to perform high complexity clinical laboratory testing.    * >=100,000 COLONIES/mL KLEBSIELLA PNEUMONIAE   Radiologic Imaging: No results found.  Assessment & Plan:  63 y.o. 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  He remains not a surgical candidate for radical cystectomy due to his comorbidities and overall frailty.  I had a very frank conversation with the patient at the bedside today.  Unfortunately, I do not have much to offer him at this time.  His options are limited to bilateral nephrostomy tube placement, however we previously deferred these due to concerns that they may cause him significant discomfort, especially since I am not sure how ambulatory he is at this point.  Additionally, with his hydronephrosis appearing rather stable compared to prior imaging, I am unsure how much nephrostomy tubes would benefit him clinically.  I would strongly recommend a repeat goals of care conversation with his family with consideration of hospice.  Agree with urinalysis, which has been ordered.  Okay to treat for cystitis per primary team.  Unclear if the milky appearance of his urine is due to purulence or cellular debris in light of his squamous cell carcinoma.  Thank you for involving me in this patient's care, I will continue to follow along.  Vittorio Mohs, PA-C 10/08/2023 4:55 PM

## 2023-10-08 NOTE — ED Provider Notes (Signed)
 Procedures     ----------------------------------------- 4:54 PM on 10/08/2023 -----------------------------------------   Case d/w hospitalist   Viviann Pastor, MD 10/08/23 1655

## 2023-10-08 NOTE — Telephone Encounter (Signed)
 Critical lab received from [facility] lab. Creat 13.9. Dr. Babara notiifed and recommends ER.   Called [facility] and spoke to pt's nurse Brandi. Informed her that pt need to go to ER, per MD recommendation. She verbalized understanding.

## 2023-10-08 NOTE — ED Triage Notes (Addendum)
 Pt comes via EMS from [facility] with c/o needing labs done at [facility] due to critical lab of 13.9 creatinine.  Pt was brought to ED

## 2023-10-08 NOTE — ED Notes (Signed)
 Pt assisted with a linen change due to incontinence. Soiled linen removed, and pt changed into a clean gown. When turning pt a stage 2 pressure injury was noted to the sacrum. Dressing applied to the injury.

## 2023-10-08 NOTE — ED Triage Notes (Signed)
 Patient to ED via [facility] from group home [facility] for abnormal lab. PT creatinine 13.9. PT c/o abd pain/ back pain. States ongoing x2-3 days. Foley in place upon arrival.

## 2023-10-08 NOTE — ED Provider Notes (Signed)
 [facility] Provider Note    Event Date/Time   First MD Initiated Contact with Patient 10/08/23 1340     (approximate)   History   Abnormal Lab   HPI  Jay Flores is a 63 y.o. male  hyperlipidemia, diabetes mellitus, COPD, CKD-3B, HIV (CD 258 on 03/29/17 and VL 70 on 04/25/23), HIV dementia HCV (s/p of Harvoni), thrombocytopenia, prostate cancer, indwelling Foley catheter placement, iron  deficiency anemia, DVT/PE on Eliquis  who comes in with concerns for abnormal labs.  Patient denies any symptoms other than a little bit of suprapubic discomfort.  He has had a Foley in place.  I reviewed the discharge note from 09/28/2023 where patient was admitted for concerns for retention.  He reported that his Foley was draining but the CT scan the ED showed distended bladder with super.  Bladder wall diverticulum.  Patient did had to undergo a cystoscopy with clot evacuation on 8/14.  Patient report having some lower discomfort in his abdomen.  They got some blood work and his creatinine was significantly elevated so he was told to come to the ER for evaluation.  He reports no blood in the urine and his Foley catheter has been draining.     Physical Exam   Triage Vital Signs: ED Triage Vitals  Encounter Vitals Group     BP 10/08/23 1344 (!) 141/80     Girls Systolic BP Percentile --      Girls Diastolic BP Percentile --      Boys Systolic BP Percentile --      Boys Diastolic BP Percentile --      Pulse Rate 10/08/23 1344 84     Resp 10/08/23 1344 18     Temp 10/08/23 1344 97.6 F (36.4 C)     Temp Source 10/08/23 1344 Oral     SpO2 10/08/23 1344 100 %     Weight 10/08/23 1342 88 lb 4.8 oz (40.1 kg)     Height 10/08/23 1342 5' 8 (1.727 m)     Head Circumference --      Peak Flow --      Pain Score 10/08/23 1342 10     Pain Loc --      Pain Education --      Exclude from Growth Chart --     Most recent vital signs: Vitals:   10/08/23 1344  BP: (!)  141/80  Pulse: 84  Resp: 18  Temp: 97.6 F (36.4 C)  SpO2: 100%     General: Awake, no distress.  CV:  Good peripheral perfusion.  Resp:  Normal effort.  Abd:  No distention.  Slight tenderness suprapubically Other:     ED Results / Procedures / Treatments   Labs (all labs ordered are listed, but only abnormal results are displayed) Labs Reviewed  CBC - Abnormal; Notable for the following components:      Result Value   WBC 11.1 (*)    RBC 3.04 (*)    Hemoglobin 8.1 (*)    HCT 27.4 (*)    MCHC 29.6 (*)    RDW 15.9 (*)    All other components within normal limits  COMPREHENSIVE METABOLIC PANEL WITH GFR - Abnormal; Notable for the following components:   Potassium 5.3 (*)    CO2 19 (*)    Glucose, Bld 115 (*)    BUN 97 (*)    Creatinine, Ser 13.76 (*)    Albumin  2.2 (*)    GFR, Estimated  4 (*)    Anion gap 16 (*)    All other components within normal limits     EKG  My interpretation of EKG:  Normal sinus rhythm 78 without any ST elevation or T wave versions, normal intervals  RADIOLOGY Pending  PROCEDURES:  Critical Care performed: No  Procedures   MEDICATIONS ORDERED IN ED: Medications - No data to display   IMPRESSION / MDM / ASSESSMENT AND PLAN / ED COURSE  I reviewed the triage vital signs and the nursing notes.   Patient's presentation is most consistent with acute presentation with potential threat to life or bodily function.   Patient comes in with concerns for bladder discomfort in the setting of renal failure.  Patient's bladder scan was normal no evidence of obstruction however I did do a bedside ultrasound and there does seem to be some product inside the bladder.  Discussed case with Dr. Francisca where patient is known to have a very aggressive bladder cancer that was resected and I suspect that it is probably that this has spread and caused obstruction of his kidneys.  He said suspects that this is related to the hydronephrosis.  He does  not want us  to replace the Foley yet but does agree with proceeding with CT imaging and they will consult on to discussed with patient.  Initially when he was in the hospital he had refused nephrostomy tubes and so that is why he feels like this is probably happening today.  His CMP does show a baseline stable hemoglobin.  His creatinine is elevated at 13  I reviewed the hospital discharge summary from 09/28/2023  The patient is on the cardiac monitor to evaluate for evidence of arrhythmia and/or significant heart rate changes.      FINAL CLINICAL IMPRESSION(S) / ED DIAGNOSES   Final diagnoses:  AKI (acute kidney injury) (HCC)  Acute renal failure, unspecified acute renal failure type (HCC)  Malignant neoplasm of urinary bladder, unspecified site [facility])     Rx / DC Orders   ED Discharge Orders     None        Note:  This document was prepared using Dragon voice recognition software and may include unintentional dictation errors.   Ernest Ronal BRAVO, MD 10/08/23 1536

## 2023-10-08 NOTE — H&P (Signed)
 History and Physical    Jay Flores FMW:[id number] DOB: 03-24-1960 DOA: 10/08/2023  DOS: the patient was seen and examined on 10/08/2023  PCP: [name], MD   Patient coming from: [facility] care facility group home  I have personally briefly reviewed patient's old medical records in [facility]  Chief Complaint: Abnormal lab  HPI: ARMOND Flores is a pleasant 63 y.o. male with medical history significant for diabetes, bladder cancer under the care of oncologist, hepatitis C, COPD, HIV, HTN, DVT/PE on Eliquis  who was brought in by ambulance from [facility] care facility group home for abnormal creatinine of 13.9, baseline creatinine of 3.  Patient also complained of abdominal pain, back pain which is 7/10 in intensity, constant, worsening with movement for the last 3 days.  Patient is not a good historian.  Patient is stated that his pain is around suprapubic area.  He was recently discharged from the hospital on 09/28/2023 where he was admitted for urinary retention.  Patient had a CT scan which showed distended bladder with bladder wall diverticulum.  Patient did undergo cystoscopy with clot evacuation on 09/19/2023.  Today patient's symptoms are very nonspecific except pain.  Patient had a blood work done which showed creatinine significantly elevated than before so he was sent to ED for evaluation for admission for AKI.  ED Course: Upon arrival to the ED, patient is found to have normal vital signs.  Started on IV fluid.  They discussed the case with Dr. Francisca the urologist and advised that patient has aggressive bladder cancer that was resected and suspected for obstruction of the kidneys.  This could be related to hydronephrosis.  Urologist does not want us  to replace the Foley but agree with proceeding with a CT imaging and they will consult to discuss with the patient.  In previous admission patient refused nephrostomy tubes and so that is why he feels like this is  probably happening today.  Hospitalist service was consulted for evaluation for admission.  Review of Systems:  ROS  All other systems negative except as noted in the HPI.  Past Medical History:  Diagnosis Date   Acute renal failure (HCC)    Altered mental status 01/20/2012   Anemia    Brain lesion    Cholelithiasis    Chronic indwelling Foley catheter    CKD (chronic kidney disease) stage 3, GFR 30-59 ml/min (HCC)    COPD (chronic obstructive pulmonary disease) (HCC)    Diabetic neuropathy (HCC)    DKA (diabetic ketoacidosis) (HCC)    DM (diabetes mellitus), type 2 (HCC)    Elevated PSA    Gait instability    uses walker   H/O urinary retention    Hepatitis C infection    s/p Harvoni   History of cocaine use    last used in 2012   HIV (human immunodeficiency virus infection) (HCC)    HIV dementia (HCC)    Hypertension    IPMN (intraductal papillary mucinous neoplasm)    Lives in group home    [facility]   Metabolic acidosis    Pancreatic cyst    Pneumonia    Thrombocytopenia (HCC)    Tobacco use     Past Surgical History:  Procedure Laterality Date   COLONOSCOPY WITH PROPOFOL  N/A 09/05/2022   Procedure: COLONOSCOPY WITH PROPOFOL ;  Surgeon: Toledo, Ladell POUR, MD;  Location: [facility] ENDOSCOPY;  Service: Gastroenterology;  Laterality: N/A;   CYSTOSCOPY  09/19/2023   Procedure: CYSTOSCOPY;  Surgeon: Twylla Glendia BROCKS, MD;  Location: [facility] ORS;  Service: Urology;;   EUS N/A 04/19/2022   Procedure: UPPER ENDOSCOPIC ULTRASOUND (EUS) LINEAR;  Surgeon: Elta Fonda SQUIBB, MD;  Location: [facility] ENDOSCOPY;  Service: Gastroenterology;  Laterality: N/A;  Requesting 1p slot   HEMOSTASIS CLIP PLACEMENT  09/05/2022   Procedure: HEMOSTASIS CLIP PLACEMENT;  Surgeon: Aundria, Ladell POUR, MD;  Location: [facility] ENDOSCOPY;  Service: Gastroenterology;;   POLYPECTOMY  09/05/2022   Procedure: POLYPECTOMY;  Surgeon: Aundria, Ladell POUR, MD;  Location: [facility] ENDOSCOPY;  Service:  Gastroenterology;;   PROSTATE BIOPSY N/A 02/26/2023   Procedure: PROSTATE BIOPSY;  Surgeon: Twylla Glendia BROCKS, MD;  Location: [facility] ORS;  Service: Urology;  Laterality: N/A;   TRANSRECTAL ULTRASOUND N/A 02/26/2023   Procedure: TRANSRECTAL ULTRASOUND;  Surgeon: Twylla Glendia BROCKS, MD;  Location: [facility] ORS;  Service: Urology;  Laterality: N/A;   TRANSURETHRAL RESECTION OF BLADDER TUMOR  09/19/2023   Procedure: TURBT (TRANSURETHRAL RESECTION OF BLADDER TUMOR);  Surgeon: Twylla Glendia BROCKS, MD;  Location: [facility] ORS;  Service: Urology;;     reports that he has been smoking cigarettes. He has a 34 pack-year smoking history. He has been exposed to tobacco smoke. He has never used smokeless tobacco. He reports that he does not currently use drugs after having used the following drugs: Cocaine. He reports that he does not drink alcohol.  No Known Allergies  Family History  Problem Relation Age of Onset   Diabetes Mellitus II Mother     Prior to Admission medications   Medication Sig Start Date End Date Taking? Authorizing Provider  Accu-Chek Softclix Lancets lancets by Other route. Use as instructed    [provider]  acetaminophen  (TYLENOL  8 HOUR ARTHRITIS PAIN) 650 MG CR tablet Take 650 mg by mouth every 4 (four) hours as needed for pain.    [provider]  apixaban  (ELIQUIS ) 5 MG TABS tablet Take 1 tablet (5 mg total) by mouth 2 (two) times daily. 08/22/23   Babara Call, MD  atorvastatin  (LIPITOR) 20 MG tablet Take 20 mg by mouth at bedtime.    [provider]  cholecalciferol  (VITAMIN D3) 25 MCG (1000 UNIT) tablet Take 1,000 Units by mouth at bedtime.    [provider]  Continuous Blood Gluc Receiver (FREESTYLE LIBRE 14 DAY READER) DEVI Apply topically. 10/31/21   [provider]  Continuous Blood Gluc Sensor (FREESTYLE LIBRE 14 DAY SENSOR) MISC Apply topically. 01/17/22   [provider]  Dolutegravir -Rilpivirine  (JULUCA ) 50-25 MG TABS Take 1 tablet by  mouth daily.    [provider]  finasteride  (PROSCAR ) 5 MG tablet Take 5 mg by mouth daily.    [provider]  folic acid  (FOLVITE ) 1 MG tablet Take 1 tablet (1 mg total) by mouth daily. 04/27/23   Darci Pore, MD  glucose blood (ACCU-CHEK AVIVA PLUS) test strip 1 each by Other route in the morning, at noon, and at bedtime. Use as instructed    [provider]  JARDIANCE 25 MG TABS tablet Take 25 mg by mouth in the morning. 01/14/23   [provider]  loperamide  (IMODIUM ) 2 MG capsule Take 2 mg by mouth 4 (four) times daily as needed for diarrhea or loose stools. 06/06/23   [provider]  metFORMIN (GLUCOPHAGE) 500 MG tablet Take 500 mg by mouth daily with breakfast. 07/03/23   [provider]  metoprolol  succinate (TOPROL -XL) 100 MG 24 hr tablet Take 100 mg by mouth daily. Patient not taking: Reported on  09/18/2023 09/03/20   [provider]  mirtazapine  (REMERON ) 7.5 MG tablet Take 7.5 mg by mouth at bedtime. Patient not taking: Reported on 09/18/2023 09/18/23   [provider]  sodium bicarbonate  650 MG tablet Take 650 mg by mouth 2 (two) times daily.    [provider]  sucralfate  (CARAFATE ) 1 g tablet Take 1 g by mouth 2 (two) times daily. 07/02/23   [provider]  tamsulosin  (FLOMAX ) 0.4 MG CAPS capsule Take 1 capsule (0.4 mg total) by mouth daily after supper. 09/28/23 10/28/23  Trudy Anthony HERO, MD  Vilazodone  HCl 20 MG TABS Take 20 mg by mouth daily.    [provider]    Physical Exam: Vitals:   10/08/23 1500 10/08/23 1530 10/08/23 1700 10/08/23 1725  BP: (!) 145/80 (!) 122/93 124/68 (!) 148/79  Pulse: 74 83  73  Resp: 16 18 (!) 21 (!) 21  Temp:    97.8 F (36.6 C)  TempSrc:    Oral  SpO2: 100% 100%  100%  Weight:      Height:        Physical Exam   Constitutional: Alert, awake, emaciated HEENT: Neck supple Respiratory: Clear to auscultation B/L, no wheezing, no  rales.  Cardiovascular: Regular rate and rhythm, no murmurs / rubs / gallops. No extremity edema. 2+ pedal pulses. No carotid bruits.  Abdomen: Soft, no tenderness, Bowel sounds positive.  Foley catheter in place Musculoskeletal: no clubbing / cyanosis. Good ROM, no contractures. Normal muscle tone.  Skin: no rashes, lesions, ulcers. Neurologic: CN 2-12 grossly intact. Sensation intact, No focal deficit identified Psychiatric: Alert and oriented x 3. Normal mood.    Labs on Admission: I have personally reviewed following labs and imaging studies  CBC: Recent Labs  Lab 10/08/23 1136 10/08/23 1400  WBC 11.1* 11.1*  NEUTROABS 8.6*  --   HGB 8.2* 8.1*  HCT 27.1* 27.4*  MCV 89.7 90.1  PLT 266 251   Basic Metabolic Panel: Recent Labs  Lab 10/08/23 1136 10/08/23 1400  NA 132* 135  K 5.7* 5.3*  CL 99 100  CO2 19* 19*  GLUCOSE 117* 115*  BUN 99* 97*  CREATININE 13.90* 13.76*  CALCIUM  9.0 8.9   GFR: Estimated Creatinine Clearance: 3.2 mL/min (A) (by C-G formula based on SCr of 13.76 mg/dL (H)). Liver Function Tests: Recent Labs  Lab 10/08/23 1136 10/08/23 1400  AST 12* 15  ALT 7 7  ALKPHOS 86 84  BILITOT 0.4 0.5  PROT 7.1 6.9  ALBUMIN  2.3* 2.2*   No results for input(s): LIPASE, AMYLASE in the last 168 hours. No results for input(s): AMMONIA in the last 168 hours. Coagulation Profile: No results for input(s): INR, PROTIME in the last 168 hours. Cardiac Enzymes: No results for input(s): CKTOTAL, CKMB, CKMBINDEX, TROPONINI, TROPONINIHS in the last 168 hours. BNP (last 3 results) No results for input(s): BNP in the last 8760 hours. HbA1C: No results for input(s): HGBA1C in the last 72 hours. CBG: No results for input(s): GLUCAP in the last 168 hours. Lipid Profile: No results for input(s): CHOL, HDL, LDLCALC, TRIG, CHOLHDL, LDLDIRECT in the last 72 hours. Thyroid Function Tests: No results for input(s): TSH, T4TOTAL,  FREET4, T3FREE, THYROIDAB in the last 72 hours. Anemia Panel: Recent Labs    10/08/23 1136  FERRITIN 540*  TIBC 144*  IRON  27*  RETICCTPCT 0.5   Urine analysis:    Component Value Date/Time   COLORURINE RED (A) 09/16/2023 2219   APPEARANCEUR CLOUDY (A)  09/16/2023 2219   APPEARANCEUR Cloudy (A) 02/11/2023 1123   LABSPEC  09/16/2023 2219    TEST NOT REPORTED DUE TO COLOR INTERFERENCE OF URINE PIGMENT   PHURINE  09/16/2023 2219    TEST NOT REPORTED DUE TO COLOR INTERFERENCE OF URINE PIGMENT   GLUCOSEU (A) 09/16/2023 2219    TEST NOT REPORTED DUE TO COLOR INTERFERENCE OF URINE PIGMENT   HGBUR (A) 09/16/2023 2219    TEST NOT REPORTED DUE TO COLOR INTERFERENCE OF URINE PIGMENT   BILIRUBINUR (A) 09/16/2023 2219    TEST NOT REPORTED DUE TO COLOR INTERFERENCE OF URINE PIGMENT   BILIRUBINUR Negative 02/11/2023 1123   KETONESUR (A) 09/16/2023 2219    TEST NOT REPORTED DUE TO COLOR INTERFERENCE OF URINE PIGMENT   PROTEINUR (A) 09/16/2023 2219    TEST NOT REPORTED DUE TO COLOR INTERFERENCE OF URINE PIGMENT   UROBILINOGEN 1.0 01/23/2012 0602   NITRITE (A) 09/16/2023 2219    TEST NOT REPORTED DUE TO COLOR INTERFERENCE OF URINE PIGMENT   LEUKOCYTESUR (A) 09/16/2023 2219    TEST NOT REPORTED DUE TO COLOR INTERFERENCE OF URINE PIGMENT    Radiological Exams on Admission: I have personally reviewed images No results found.  EKG: My personal interpretation of EKG shows: Sinus rhythm    Assessment/Plan Principal Problem:   AKI (acute kidney injury) (HCC) Active Problems:   Acute kidney injury superimposed on stage 3b chronic kidney disease (HCC)   Bilateral hydronephrosis   Bladder mass   DVT (deep venous thrombosis)_left leg   Pulmonary embolism (HCC)   HTN (hypertension)   Type II diabetes mellitus with renal manifestations (HCC)   HLD (hyperlipidemia)   HIV (human immunodeficiency virus infection) (HCC)   Anemia in chronic kidney disease (CKD)   Protein-calorie  malnutrition, severe   Hepatitis C infection    Assessment and Plan:  63 year old male W/PMH of bladder cancer, bilateral hydronephrosis, HTN, DM, HLD, HIV, hep C, pulmonary embolism on Eliquis  who was brought in from group home for AKI.  1.  AKI in the setting of bladder cancer s/p bilateral hydronephrosis, patient refused PCN in the past - He will be admitted to hospital as inpatient - He will be given IV fluid hydration - Pain medications - Urology evaluation - Neurology advised for CT renogram which he ED has ordered - Continue to monitor kidney function - Follow urology and oncology recommendation  2.  History of DVT/PE - Eliquis  was on hold/pause I suspect it may be related to anemia but patient does not know - I will keep holding Eliquis  as patient may go for surgical procedure tomorrow by urology If there is no plan from urology for surgical procedure, may resume Eliquis  - Continue to monitor hemoglobin hematocrit  3.  HTN/HLD - Resume home medication  4.  Type 2 diabetes - Continue insulin  sliding scale  5.  HIV/hep C - Resume his home medication    DVT prophylaxis: Eliquis  on hold due to likely going for procedure Code Status: Full Code Family Communication: None available Disposition Plan: Group home Consults called: Urology by ED provider Admission status: Inpatient, Telemetry bed   Nena Rebel, MD [facility] 10/08/2023, 5:27 PM

## 2023-10-09 ENCOUNTER — Ambulatory Visit: Admitting: Physician Assistant

## 2023-10-09 DIAGNOSIS — Z7189 Other specified counseling: Secondary | ICD-10-CM

## 2023-10-09 DIAGNOSIS — Z515 Encounter for palliative care: Secondary | ICD-10-CM

## 2023-10-09 DIAGNOSIS — E43 Unspecified severe protein-calorie malnutrition: Secondary | ICD-10-CM

## 2023-10-09 DIAGNOSIS — N1330 Unspecified hydronephrosis: Secondary | ICD-10-CM

## 2023-10-09 DIAGNOSIS — B1920 Unspecified viral hepatitis C without hepatic coma: Secondary | ICD-10-CM

## 2023-10-09 DIAGNOSIS — N179 Acute kidney failure, unspecified: Secondary | ICD-10-CM | POA: Diagnosis not present

## 2023-10-09 DIAGNOSIS — C679 Malignant neoplasm of bladder, unspecified: Secondary | ICD-10-CM | POA: Diagnosis not present

## 2023-10-09 DIAGNOSIS — N1832 Chronic kidney disease, stage 3b: Secondary | ICD-10-CM

## 2023-10-09 DIAGNOSIS — Z21 Asymptomatic human immunodeficiency virus [HIV] infection status: Secondary | ICD-10-CM

## 2023-10-09 DIAGNOSIS — N3289 Other specified disorders of bladder: Secondary | ICD-10-CM

## 2023-10-09 LAB — CBC
HCT: 22.7 % — ABNORMAL LOW (ref 39.0–52.0)
Hemoglobin: 7.2 g/dL — ABNORMAL LOW (ref 13.0–17.0)
MCH: 27.4 pg (ref 26.0–34.0)
MCHC: 31.7 g/dL (ref 30.0–36.0)
MCV: 86.3 fL (ref 80.0–100.0)
Platelets: 265 K/uL (ref 150–400)
RBC: 2.63 MIL/uL — ABNORMAL LOW (ref 4.22–5.81)
RDW: 15.7 % — ABNORMAL HIGH (ref 11.5–15.5)
WBC: 10.1 K/uL (ref 4.0–10.5)
nRBC: 0 % (ref 0.0–0.2)

## 2023-10-09 LAB — COMPREHENSIVE METABOLIC PANEL WITH GFR
ALT: 7 U/L (ref 0–44)
AST: 16 U/L (ref 15–41)
Albumin: 1.9 g/dL — ABNORMAL LOW (ref 3.5–5.0)
Alkaline Phosphatase: 72 U/L (ref 38–126)
Anion gap: 20 — ABNORMAL HIGH (ref 5–15)
BUN: 98 mg/dL — ABNORMAL HIGH (ref 8–23)
CO2: 18 mmol/L — ABNORMAL LOW (ref 22–32)
Calcium: 8.6 mg/dL — ABNORMAL LOW (ref 8.9–10.3)
Chloride: 98 mmol/L (ref 98–111)
Creatinine, Ser: 13.87 mg/dL — ABNORMAL HIGH (ref 0.61–1.24)
GFR, Estimated: 4 mL/min — ABNORMAL LOW (ref >60)
Glucose, Bld: 125 mg/dL — ABNORMAL HIGH (ref 70–99)
Potassium: 6.3 mmol/L (ref 3.5–5.1)
Sodium: 136 mmol/L (ref 135–145)
Total Bilirubin: 0.4 mg/dL (ref 0.0–1.2)
Total Protein: 6.1 g/dL — ABNORMAL LOW (ref 6.5–8.1)

## 2023-10-09 LAB — GLUCOSE, CAPILLARY
Glucose-Capillary: 102 mg/dL — ABNORMAL HIGH (ref 70–99)
Glucose-Capillary: 110 mg/dL — ABNORMAL HIGH (ref 70–99)
Glucose-Capillary: 119 mg/dL — ABNORMAL HIGH (ref 70–99)
Glucose-Capillary: 97 mg/dL (ref 70–99)

## 2023-10-09 LAB — PROTIME-INR
INR: 1.4 — ABNORMAL HIGH (ref 0.8–1.2)
Prothrombin Time: 18.2 s — ABNORMAL HIGH (ref 11.4–15.2)

## 2023-10-09 LAB — POTASSIUM: Potassium: 6.2 mmol/L — ABNORMAL HIGH (ref 3.5–5.1)

## 2023-10-09 MED ORDER — DEXTROSE 50 % IV SOLN
1.0000 | Freq: Once | INTRAVENOUS | Status: AC
  Administered 2023-10-09: 50 mL via INTRAVENOUS
  Filled 2023-10-09: qty 50

## 2023-10-09 MED ORDER — CHLORHEXIDINE GLUCONATE CLOTH 2 % EX PADS
6.0000 | MEDICATED_PAD | Freq: Every day | CUTANEOUS | Status: DC
  Administered 2023-10-09 – 2023-10-16 (×7): 6 via TOPICAL

## 2023-10-09 MED ORDER — SODIUM BICARBONATE 8.4 % IV SOLN
50.0000 meq | Freq: Once | INTRAVENOUS | Status: AC
  Administered 2023-10-09: 50 meq via INTRAVENOUS
  Filled 2023-10-09: qty 50

## 2023-10-09 MED ORDER — SODIUM ZIRCONIUM CYCLOSILICATE 10 G PO PACK
10.0000 g | PACK | Freq: Once | ORAL | Status: AC
  Administered 2023-10-09: 10 g via ORAL
  Filled 2023-10-09: qty 1

## 2023-10-09 MED ORDER — ATORVASTATIN CALCIUM 20 MG PO TABS
20.0000 mg | ORAL_TABLET | Freq: Every day | ORAL | Status: DC
  Administered 2023-10-09 – 2023-10-15 (×7): 20 mg via ORAL
  Filled 2023-10-09 (×7): qty 1

## 2023-10-09 MED ORDER — CALCIUM GLUCONATE-NACL 1-0.675 GM/50ML-% IV SOLN
1.0000 g | Freq: Once | INTRAVENOUS | Status: AC
  Administered 2023-10-09: 1000 mg via INTRAVENOUS
  Filled 2023-10-09: qty 50

## 2023-10-09 MED ORDER — INSULIN ASPART 100 UNIT/ML IV SOLN
10.0000 [IU] | Freq: Once | INTRAVENOUS | Status: AC
  Administered 2023-10-09: 10 [IU] via INTRAVENOUS
  Filled 2023-10-09: qty 0.1

## 2023-10-09 NOTE — Progress Notes (Signed)
 PROGRESS NOTE    Jay Flores  FMW:[id number] DOB: 20-Jul-1960 DOA: 10/08/2023 PCP: [name], MD  Assessment & Plan:   Principal Problem:   AKI (acute kidney injury) (HCC) Active Problems:   Acute kidney injury superimposed on stage 3b chronic kidney disease (HCC)   Bilateral hydronephrosis   Bladder mass   DVT (deep venous thrombosis)_left leg   Pulmonary embolism (HCC)   HTN (hypertension)   Type II diabetes mellitus with renal manifestations (HCC)   HLD (hyperlipidemia)   HIV (human immunodeficiency virus infection) (HCC)   Anemia in chronic kidney disease (CKD)   Protein-calorie malnutrition, severe   Hepatitis C infection  Assessment and Plan: AKI: Cr is 13. in the setting of bladder cancer s/p bilateral hydronephrosis. Continue w/ foley. Previously refused percutaneous nephrostomy tubes. Uro following and recs appec. Nephro consulted. Uro following and recs apprec  Hypokalemia: likely secondary to above. Lokelma  x 1. S/p calcium  gluconate x 1. Repeat potassium level ordered  Bladder cancer: not a candidate for treatment. F/u outpatient w/ onco    Hx of DVT/PE: holding eliquis . Continue on heparin  sq  HTN: continue on home dose of metoprolol    HLD: continue on home dose of statin  DM2: likely poorly controlled. Continue on SSI w/ accuchecks    HIV: continue on home dose of juluca   HCV: continue w/ supportive care       DVT prophylaxis: heparin   Code Status:  full  Family Communication:  Disposition Plan: unclear  Level of care: Telemetry Medical  Status is: Inpatient Remains inpatient appropriate because: severity of illness    Consultants:  Uro Nephro Palliative care  Procedures:   Antimicrobials:   Subjective: Pt is oriented to self only   Objective: Vitals:   10/08/23 1934 10/09/23 0028 10/09/23 0307 10/09/23 0742  BP: (!) 148/92 (!) 148/83 (!) 144/81 (!) 149/85  Pulse: 84 78 87 80  Resp: 16 20  16   Temp:  98.2 F (36.8  C) 98.3 F (36.8 C) 97.7 F (36.5 C)  TempSrc:      SpO2: 100% 100% 100% 100%  Weight:      Height:        Intake/Output Summary (Last 24 hours) at 10/09/2023 0959 Last data filed at 10/08/2023 2129 Gross per 24 hour  Intake 240 ml  Output 400 ml  Net -160 ml   Filed Weights   10/08/23 1342  Weight: 40.1 kg    Examination:  General exam: Appears confused Respiratory system: decreased breath sounds b/l  Cardiovascular system: S1 & S2 +. No  rubs, gallops or clicks.  Gastrointestinal system: Abdomen is nondistended, soft and nontender. Normal bowel sounds heard. Central nervous system: Alert and oriented to self only  Psychiatry: Judgement and insight appears poor. Flat mood and affect    Data Reviewed: I have personally reviewed following labs and imaging studies  CBC: Recent Labs  Lab 10/08/23 1136 10/08/23 1400 10/09/23 0432  WBC 11.1* 11.1* 10.1  NEUTROABS 8.6*  --   --   HGB 8.2* 8.1* 7.2*  HCT 27.1* 27.4* 22.7*  MCV 89.7 90.1 86.3  PLT 266 251 265   Basic Metabolic Panel: Recent Labs  Lab 10/08/23 1136 10/08/23 1400 10/09/23 0432  NA 132* 135 136  K 5.7* 5.3* 6.3*  CL 99 100 98  CO2 19* 19* 18*  GLUCOSE 117* 115* 125*  BUN 99* 97* 98*  CREATININE 13.90* 13.76* 13.87*  CALCIUM  9.0 8.9 8.6*   GFR: Estimated Creatinine Clearance: 3.1  mL/min (A) (by C-G formula based on SCr of 13.87 mg/dL (H)). Liver Function Tests: Recent Labs  Lab 10/08/23 1136 10/08/23 1400 10/09/23 0432  AST 12* 15 16  ALT 7 7 7   ALKPHOS 86 84 72  BILITOT 0.4 0.5 0.4  PROT 7.1 6.9 6.1*  ALBUMIN  2.3* 2.2* 1.9*   No results for input(s): LIPASE, AMYLASE in the last 168 hours. No results for input(s): AMMONIA in the last 168 hours. Coagulation Profile: Recent Labs  Lab 10/09/23 0432  INR 1.4*   Cardiac Enzymes: No results for input(s): CKTOTAL, CKMB, CKMBINDEX, TROPONINI in the last 168 hours. BNP (last 3 results) No results for input(s): PROBNP in  the last 8760 hours. HbA1C: No results for input(s): HGBA1C in the last 72 hours. CBG: Recent Labs  Lab 10/08/23 1823 10/08/23 1933 10/09/23 0730  GLUCAP 121* 154* 102*   Lipid Profile: No results for input(s): CHOL, HDL, LDLCALC, TRIG, CHOLHDL, LDLDIRECT in the last 72 hours. Thyroid Function Tests: No results for input(s): TSH, T4TOTAL, FREET4, T3FREE, THYROIDAB in the last 72 hours. Anemia Panel: Recent Labs    10/08/23 1136  FERRITIN 540*  TIBC 144*  IRON  27*  RETICCTPCT 0.5   Sepsis Labs: No results for input(s): PROCALCITON, LATICACIDVEN in the last 168 hours.  No results found for this or any previous visit (from the past 240 hours).       Radiology Studies: CT Renal Stone Study Result Date: 10/08/2023 CLINICAL DATA:  Abdominal and flank pain EXAM: CT ABDOMEN AND PELVIS WITHOUT CONTRAST TECHNIQUE: Multidetector CT imaging of the abdomen and pelvis was performed following the standard protocol without IV contrast. RADIATION DOSE REDUCTION: This exam was performed according to the departmental dose-optimization program which includes automated exposure control, adjustment of the mA and/or kV according to patient size and/or use of iterative reconstruction technique. COMPARISON:  CT abdomen and pelvis 09/16/2023. FINDINGS: Lower chest: There is scarring or atelectasis in the right lung base. Hepatobiliary: Gallstones are present. There is no biliary ductal dilatation. The liver is within normal limits. Pancreas: There are dense calcifications throughout the pancreas compatible with chronic pancreatitis similar to the prior study. Cystic change in the region of the pancreatic head/duodenal measures 2.5 cm and has slightly increased in size. No acute inflammatory change. Spleen: Normal in size without focal abnormality. Adrenals/Urinary Tract: The bilateral adrenal glands are within normal limits. There is moderate bilateral hydroureteronephrosis to the  level of the bladder without obstructing calculus, similar to the prior study. The bladder is distended with superior bladder diverticulum containing air-fluid levels similar to the prior study. The bladder is filled with heterogeneous hyperdense material and air. The bladder wall appears irregular and thickened, particularly inferiorly and anteriorly. There also a few punctate calcifications in the bladder. Foley catheter is present. Stomach/Bowel: Stomach is within normal limits. No evidence of bowel wall thickening, distention, or inflammatory changes. The appendix is not visualized. Scattered colonic diverticula are present. Vascular/Lymphatic: Aortic atherosclerosis. No enlarged abdominal or pelvic lymph nodes. Reproductive: Prostate gland is mildly enlarged. Other: There is no ascites or focal abdominal wall hernia. Musculoskeletal: No acute or significant osseous findings. IMPRESSION: 1. Moderate bilateral hydroureteronephrosis to the level of the bladder without obstructing calculus. The bladder is distended with heterogeneous hyperdense material and air. The bladder wall appears irregular and thickened, particularly inferiorly and anteriorly. Findings are concerning for bladder neoplasm. 2. Cholelithiasis. 3. Chronic pancreatitis. 4. Cystic change in the region of the pancreatic head/duodenum has slightly increased in size. This can  be further evaluated with MRI. 5. Colonic diverticulosis. 6. Aortic atherosclerosis. Aortic Atherosclerosis (ICD10-I70.0). Electronically Signed   By: Greig Pique M.D.   On: 10/08/2023 17:30        Scheduled Meds:  Chlorhexidine  Gluconate Cloth  6 each Topical Daily   dolutegravir -rilpivirine   1 tablet Oral Daily   finasteride   5 mg Oral Daily   folic acid   1 mg Oral Daily   heparin   5,000 Units Subcutaneous Q8H   insulin  aspart  0-6 Units Subcutaneous TID WC   metoprolol  succinate  100 mg Oral Daily   mirtazapine   7.5 mg Oral QHS   sodium bicarbonate   650 mg  Oral BID   sucralfate   1 g Oral BID   tamsulosin   0.4 mg Oral QPC supper   Continuous Infusions:   LOS: 1 day      Anthony CHRISTELLA Pouch, MD [facility] Pager [phone]  If 7PM-7AM, please contact night-coverage [email or web address]  10/09/2023, 9:59 AM

## 2023-10-09 NOTE — Plan of Care (Signed)

## 2023-10-09 NOTE — Consult Note (Signed)
 Palliative Care Consult Note                                  Date: 10/09/2023   Patient Name: Jay Flores  DOB: 23-Aug-1960  MRN: [id number]  Age / Sex: 63 y.o., male  PCP: [name], MD Referring Physician: Trudy Anthony HERO, MD  Reason for Consultation: Establishing goals of care  Past Medical History:  Diagnosis Date   Acute renal failure [facility])    Altered mental status 01/20/2012   Anemia    Brain lesion    Cholelithiasis    Chronic indwelling Foley catheter    CKD (chronic kidney disease) stage 3, GFR 30-59 ml/min (HCC)    COPD (chronic obstructive pulmonary disease) (HCC)    Diabetic neuropathy (HCC)    DKA (diabetic ketoacidosis) (HCC)    DM (diabetes mellitus), type 2 (HCC)    Elevated PSA    Gait instability    uses walker   H/O urinary retention    Hepatitis C infection    s/p Harvoni   History of cocaine use    last used in 2012   HIV (human immunodeficiency virus infection) (HCC)    HIV dementia (HCC)    Hypertension    IPMN (intraductal papillary mucinous neoplasm)    Lives in group home    [facility]   Metabolic acidosis    Pancreatic cyst    Pneumonia    Thrombocytopenia (HCC)    Tobacco use     Subjective:   This NP Camellia Kays reviewed medical records, received report from team, assessed the patient and then meet at the patient's bedside to discuss diagnosis, prognosis, GOC, EOL wishes disposition and options.  Before meeting with the patient/family, I spent time reviewing the chart notes including admission H&P, ED provider notes, nursing notes yesterday, nursing note from today, internal medicine note from today. I also reviewed vital signs, nursing flowsheets, medication administrations record, labs, and imaging. Labs reviewed include CMP which shows significant hyperkalemia in the setting of severe AKI, persistent elevated creatinine stable but extremely  elevated at 13.87 today.  CBC shows resolution of previous leukocytosis at 10.1 today, hemoglobin low but stable in the setting of known anemia with hemoglobin 7.2 which appears to be within his baseline of 7-8.  I met with the patient at bedside, no family was present.  I attempted to call the patient's Sister [name] and left a voicemail for callback.  I also attempted to call sister Nichole, although I was then unable to reach due to the line being busy multiple times.   We meet to discuss diagnosis prognosis, GOC, EOL wishes, disposition and options. Concept of Palliative Care was introduced as specialized medical care for people and their families living with serious illness.  If focuses on providing relief from the symptoms and stress of a serious illness.  The goal is to improve quality of life for both the patient and the family. Values and goals of care important to patient and family were attempted to be elicited.  Created space and opportunity for patient  and family to explore thoughts and feelings regarding current medical situation   Natural trajectory and current clinical status were discussed. Questions and concerns addressed. Patient  encouraged to call with questions or concerns.    Patient/Family Understanding of Illness: Deferred until conversation with family  Patient Values: Deferred  Baseline Status:  At baseline appears to live at a group home, other information deferred until conversation with family.  Today's Discussion: In addition to discussions described above I discussed with the medical team and extensive chart review.  It appears the patient has an aggressive bladder cancer with limited to no treatment options.  He was just admitted 2 weeks ago and at that time family decided to make him DNR/DNI.  At this admission he was deemed a full code, no documentation of family discussion.  Will make attempts to discuss CODE STATUS with family as soon as they can be reached.  At  last admission also recommended consideration of comfort care/hospice care due to his likely inability to tolerate treatments.  However, at that time family opted to pursue treatment and evaluation in the outpatient setting by oncology.  At this time we will continue attempts to reach family for ongoing GOC conversations given readmissions for severe AKI and limited cancer treatment options.  Goals: To be determined after discussion with family.  Review of Systems  Constitutional:        Complains of abdominal pain and back pain today  Respiratory:  Negative for shortness of breath.   Gastrointestinal:  Positive for abdominal pain. Negative for nausea and vomiting.    Objective:   Primary Diagnoses: Present on Admission:  AKI (acute kidney injury) (HCC)  Bladder mass  Anemia in chronic kidney disease (CKD)  Hepatitis C infection  Acute kidney injury superimposed on stage 3b chronic kidney disease (HCC)  Bilateral hydronephrosis  DVT (deep venous thrombosis)_left leg  Pulmonary embolism (HCC)  HTN (hypertension)  Type II diabetes mellitus with renal manifestations (HCC)  HLD (hyperlipidemia)  HIV (human immunodeficiency virus infection) (HCC)  Protein-calorie malnutrition, severe   Vital Signs:  BP 129/66 (BP Location: Left Arm)   Pulse 67   Temp 97.9 F (36.6 C)   Resp 15   Ht 5' 8 (1.727 m)   Wt 40.1 kg   SpO2 100%   BMI 13.43 kg/m   Physical Exam Vitals and nursing note reviewed.  Constitutional:      General: He is sleeping. He is not in acute distress. HENT:     Head: Normocephalic and atraumatic.  Cardiovascular:     Rate and Rhythm: Normal rate.  Pulmonary:     Effort: Pulmonary effort is normal. No respiratory distress.  Abdominal:     General: Abdomen is flat. Bowel sounds are normal. There is no distension.     Palpations: Abdomen is soft.     Tenderness: There is abdominal tenderness.  Skin:    General: Skin is warm and dry.  Neurological:      Mental Status: He is easily aroused. He is disoriented.     Comments: Oriented to person and place, understands basics of his situation but no details or pros/cons of offered treatments     Palliative Assessment/Data: 60-70%   Assessment & Plan:   HPI/Patient Profile: 63 y.o. male  with past medical history of diabetes, bladder cancer under the care of oncologist, hepatitis C, COPD, HIV, HTN, DVT/PE on Eliquis  who was brought in by ambulance from [facility] care facility group home for abnormal creatinine of 13.9, baseline creatinine of 3. Patient also complained of abdominal pain, back pain.  He was admitted on 10/08/2023 with AKI in the setting of bladder cancer status post bilateral hydronephrosis and recently refused PCN, history of DVT/PE, and others.  SUMMARY OF RECOMMENDATIONS   Full code until discussion with family Continue current  scope of treatment until discussion with family Palliative medicine will continue attempts to reach family Palliative medicine will continue to follow  Symptom Management:  Per primary team Palliative medicine is available to assist as needed  Code Status: Full Code  Prognosis:  Unable to determine  Discharge Planning:  To Be Determined   Discussed with: Patient, medical team, nursing team    Thank you for allowing us  to participate in the care of Jay Flores PMT will continue to support holistically.  Billing based on MDM:  Moderate  Detailed review of medical records (labs, imaging, vital signs), medically appropriate exam, discussed with treatment team, counseling and education to patient, family, & staff, documenting clinical information, medication management, coordination of care  Signed by: Camellia Kays, NP Palliative Medicine Team  Team Phone # [phone] (Nights/Weekends)  10/09/2023, 3:08 PM

## 2023-10-09 NOTE — TOC Initial Note (Signed)
 Transition of Care [facility]) - Initial/Assessment Note    Patient Details  Name: Jay Flores MRN: [id number] Date of Birth: 1960-07-05  Transition of Care [facility] CM/SW Contact:    [name], LCSW Phone Number: 10/09/2023, 3:45 PM  Clinical Narrative: LCSW A. Rummer met with pt in room 103. Pt reports pcp is LOIS Hamilton NP, group home provides transport to Jay Flores medical appts. Jay Flores reports he will return to group home upon medical discharge. No further TOC needs.                   Expected Discharge Plan: Group Home Barriers to Discharge: Continued Medical Work up   Patient Goals and CMS Choice            Expected Discharge Plan and Services   Discharge Planning Services: CM Consult   Living arrangements for the past 2 months: Group Home                    Group Home                  Prior Living Arrangements/Services Living arrangements for the past 2 months: Group Home Lives with:: Facility Resident   Do you feel safe going back to the place where you live?: Yes               Activities of Daily Living   ADL Screening (condition at time of admission) Independently performs ADLs?: No Does the patient have a NEW difficulty with bathing/dressing/toileting/self-feeding that is expected to last >3 days?: No Does the patient have a NEW difficulty with getting in/out of bed, walking, or climbing stairs that is expected to last >3 days?: No Does the patient have a NEW difficulty with communication that is expected to last >3 days?: No Is the patient deaf or have difficulty hearing?: No Does the patient have difficulty seeing, even when wearing glasses/contacts?: No Does the patient have difficulty concentrating, remembering, or making decisions?: No  Permission Sought/Granted                  Emotional Assessment Appearance:: Appears stated age Attitude/Demeanor/Rapport: Engaged Affect (typically observed): Appropriate Orientation:  : Oriented to Self, Oriented to Place, Oriented to  Time, Oriented to Situation   Psych Involvement: No (comment)  Admission diagnosis:  AKI (acute kidney injury) (HCC) [N17.9] Malignant neoplasm of urinary bladder, unspecified site [facility]) [C67.9] Acute renal failure, unspecified acute renal failure type (HCC) [N17.9] Patient Active Problem List   Diagnosis Date Noted   Hematuria 09/20/2023   Acute kidney injury superimposed on stage 3b chronic kidney disease (HCC) 09/16/2023   Bilateral hydronephrosis 09/16/2023   Urinary retention 09/16/2023   CAD (coronary artery disease) 09/16/2023   DVT (deep venous thrombosis)_left leg 09/16/2023   Pulmonary embolism (HCC) 09/16/2023   Iron  deficiency anemia 09/16/2023   Bladder wall thickening 09/16/2023   Bladder mass 08/22/2023   Protein-calorie malnutrition, severe 08/01/2023   NSTEMI (non-ST elevated myocardial infarction) (HCC) 07/29/2023   Diabetes mellitus type 2, noninsulin dependent (HCC) 07/29/2023   CKD (chronic kidney disease) stage 4, GFR 15-29 ml/min (HCC) 07/29/2023   Leukocytosis 07/29/2023   Right pulmonary infiltrate on CXR 07/29/2023   Community acquired pneumonia 07/06/2023   Chest discomfort 07/06/2023   Depression 07/06/2023   Anemia in chronic kidney disease (CKD) 04/24/2023   HLD (hyperlipidemia) 04/24/2023   Syncope 04/24/2023   Type II diabetes mellitus with renal manifestations (HCC) 04/24/2023   Prostate  cancer (HCC) 04/24/2023   Elevated lactic acid level 04/24/2023   Pancreatic cyst 03/23/2022   Callus 11/23/2021   Pain due to onychomycosis of toenails of both feet 06/15/2021   Diabetic neuropathy (HCC) 06/15/2021   Bladder dysfunction 05/07/2021   H/O urinary retention 05/07/2021   Tobacco use 04/17/2018   DKA (diabetic ketoacidosis) (HCC) 03/29/2017   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   Leg weakness, bilateral 01/18/2016   Difficulty walking 12/19/2015   Hyperreflexia of lower extremity 12/19/2015    Muscle spasm of both lower legs 12/19/2015   HTN (hypertension) 02/19/2012   Weakness 02/19/2012   Metabolic acidosis 01/27/2012   Hypokalemia 01/23/2012   PNA (pneumonia) 01/22/2012   HIV (human immunodeficiency virus infection) (HCC) 01/22/2012   Brain lesion 01/22/2012   AKI (acute kidney injury) (HCC) 01/22/2012   Anemia 01/22/2012   Altered mental status 01/22/2012   Hepatitis C infection 10/05/2011   PCP:  [name], MD Pharmacy:   [facility] - [place], [place] - [address] [place] [address] Phone: [phone] Fax: [phone]     Social Drivers of Health (SDOH) Social History: SDOH Screenings   Food Insecurity: No Food Insecurity (10/08/2023)  Housing: Low Risk  (10/08/2023)  Transportation Needs: No Transportation Needs (10/08/2023)  Utilities: Not At Risk (10/08/2023)  Depression (PHQ2-9): Low Risk  (03/23/2022)  Tobacco Use: High Risk (10/08/2023)   SDOH Interventions:     Readmission Risk Interventions    07/31/2023    9:41 AM  Readmission Risk Prevention Plan  Transportation Screening Complete  Medication Review (RN Care Manager) Complete  PCP or Specialist appointment within 3-5 days of discharge Complete  SW Recovery Care/Counseling Consult Complete  Palliative Care Screening Not Applicable  Skilled Nursing Facility Not Applicable

## 2023-10-09 NOTE — Consult Note (Signed)
 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            History of Present Illness: Patient is a 63 y.o. 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  The patient's baseline creatinine is in the upper twos.  He was recently admitted to the hospital on 09/28/2023 for urinary retention.  At that time CT scan showed distended bladder with bladder wall diverticulum.  He underwent cystoscopy with clot evacuation on 09/19/2023.  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  He now comes back with a creatinine of 13.9.  Foley catheter in place.  Imaging shows bilateral hydronephrosis.  Urology has seen the patient.  It appears that his options are limited to bilateral nephrostomy tube placement.  He did not appear to be a good surgical candidate for radical cystectomy due to his comorbidities and overall frailty per urology.   Medications: Outpatient medications: Medications Prior to Admission  Medication Sig Dispense Refill Last Dose/Taking   acetaminophen  (TYLENOL  8 HOUR ARTHRITIS PAIN) 650 MG CR tablet Take 650 mg by mouth every 4 (four) hours as needed for pain.   Taking As Needed   folic acid  (FOLVITE ) 1 MG tablet Take 1 tablet (1 mg total) by mouth daily. 90 tablet 1 Unknown   loperamide  (IMODIUM ) 2 MG capsule Take 2 mg by mouth 4 (four) times daily as needed for diarrhea or  loose stools.   Taking As Needed   tamsulosin  (FLOMAX ) 0.4 MG CAPS capsule Take 1 capsule (0.4 mg total) by mouth daily after supper. 30 capsule 0 Unknown   Accu-Chek Softclix Lancets lancets by Other route. Use as instructed      [Paused] apixaban  (ELIQUIS ) 5 MG TABS tablet Take 1 tablet (5 mg total) by mouth 2 (two) times daily. 60 tablet 3    atorvastatin  (LIPITOR) 20 MG tablet Take 20 mg by mouth at bedtime.   Unknown   cholecalciferol  (VITAMIN D3) 25 MCG (1000 UNIT) tablet Take 1,000 Units by mouth at bedtime.   Unknown   Continuous Blood Gluc Receiver (FREESTYLE LIBRE 14 DAY READER) DEVI Apply topically.      Continuous Blood Gluc Sensor (FREESTYLE LIBRE 14 DAY SENSOR) MISC Apply topically.      Dolutegravir -Rilpivirine  (JULUCA ) 50-25 MG TABS Take 1 tablet by mouth daily.   Unknown   finasteride  (PROSCAR ) 5 MG tablet Take 5 mg by mouth daily.   Unknown   glucose blood (ACCU-CHEK AVIVA PLUS) test strip 1 each by Other route in the morning, at noon, and at bedtime. Use as instructed      JARDIANCE 25 MG TABS tablet Take 25 mg by  mouth in the morning.   Unknown   metFORMIN (GLUCOPHAGE) 500 MG tablet Take 500 mg by mouth daily with breakfast.   Unknown   metoprolol  succinate (TOPROL -XL) 100 MG 24 hr tablet Take 100 mg by mouth daily. (Patient not taking: Reported on 09/18/2023)   Unknown   mirtazapine  (REMERON ) 7.5 MG tablet Take 7.5 mg by mouth at bedtime. (Patient not taking: Reported on 09/18/2023)   Unknown   sodium bicarbonate  650 MG tablet Take 650 mg by mouth 2 (two) times daily. (Patient not taking: Reported on 10/08/2023)   Unknown   sucralfate  (CARAFATE ) 1 g tablet Take 1 g by mouth 2 (two) times daily.   Unknown   Vilazodone  HCl 20 MG TABS Take 20 mg by mouth daily.   Unknown    Current medications: Current Facility-Administered Medications  Medication Dose Route Frequency Provider Last Rate Last Admin   acetaminophen  (TYLENOL ) tablet 650 mg  650 mg Oral Q6H PRN Paudel, Keshab, MD        Or   acetaminophen  (TYLENOL ) suppository 650 mg  650 mg Rectal Q6H PRN Paudel, Nena, MD       atorvastatin  (LIPITOR) tablet 20 mg  20 mg Oral QHS Trudy Anthony HERO, MD       Chlorhexidine  Gluconate Cloth 2 % PADS 6 each  6 each Topical Daily Trudy Anthony HERO, MD   6 each at 10/09/23 9148   dolutegravir -rilpivirine  (JULUCA ) 50-25 MG per tablet 1 tablet  1 tablet Oral Daily Paudel, Keshab, MD   1 tablet at 10/09/23 0849   finasteride  (PROSCAR ) tablet 5 mg  5 mg Oral Daily Paudel, Keshab, MD   5 mg at 10/09/23 0849   folic acid  (FOLVITE ) tablet 1 mg  1 mg Oral Daily Paudel, Keshab, MD   1 mg at 10/09/23 0849   heparin  injection 5,000 Units  5,000 Units Subcutaneous Q8H Paudel, Nena, MD   5,000 Units at 10/09/23 1401   insulin  aspart (novoLOG ) injection 0-6 Units  0-6 Units Subcutaneous TID WC Paudel, Nena, MD       metoprolol  succinate (TOPROL -XL) 24 hr tablet 100 mg  100 mg Oral Daily Paudel, Keshab, MD   100 mg at 10/09/23 0848   mirtazapine  (REMERON ) tablet 7.5 mg  7.5 mg Oral QHS Paudel, Nena, MD   7.5 mg at 10/08/23 2130   morphine  (PF) 2 MG/ML injection 2 mg  2 mg Intravenous Q2H PRN Paudel, Keshab, MD       ondansetron  (ZOFRAN ) tablet 4 mg  4 mg Oral Q6H PRN Paudel, Keshab, MD       Or   ondansetron  (ZOFRAN ) injection 4 mg  4 mg Intravenous Q6H PRN Paudel, Nena, MD       oxyCODONE  (Oxy IR/ROXICODONE ) immediate release tablet 5 mg  5 mg Oral Q4H PRN Paudel, Keshab, MD   5 mg at 10/09/23 0733   polyethylene glycol (MIRALAX  / GLYCOLAX ) packet 17 g  17 g Oral Daily PRN Paudel, Keshab, MD       sodium bicarbonate  tablet 650 mg  650 mg Oral BID Paudel, Keshab, MD   650 mg at 10/09/23 0848   sucralfate  (CARAFATE ) tablet 1 g  1 g Oral BID Paudel, Keshab, MD   1 g at 10/09/23 0848   tamsulosin  (FLOMAX ) capsule 0.4 mg  0.4 mg Oral QPC supper Paudel, Keshab, MD   0.4 mg at 10/08/23 1820      Allergies: No Known Allergies    Past Medical History: Past Medical History:  Diagnosis Date   Acute renal failure (HCC)    Altered mental status 01/20/2012   Anemia    Brain lesion    Cholelithiasis    Chronic indwelling Foley catheter    CKD (chronic kidney disease) stage 3, GFR 30-59 ml/min (HCC)    COPD (chronic obstructive pulmonary disease) (HCC)    Diabetic neuropathy (HCC)    DKA (diabetic ketoacidosis) (HCC)    DM (diabetes mellitus), type 2 (HCC)    Elevated PSA    Gait instability    uses walker   H/O urinary retention    Hepatitis C infection    s/p Harvoni   History of cocaine use    last used in 2012   HIV (human immunodeficiency virus infection) (HCC)    HIV dementia (HCC)    Hypertension    IPMN (intraductal papillary mucinous neoplasm)    Lives in group home    [facility]   Metabolic acidosis    Pancreatic cyst    Pneumonia    Thrombocytopenia (HCC)    Tobacco use      Past Surgical History: Past Surgical History:  Procedure Laterality Date   COLONOSCOPY WITH PROPOFOL  N/A 09/05/2022   Procedure: COLONOSCOPY WITH PROPOFOL ;  Surgeon: Toledo, Ladell POUR, MD;  Location: [facility] ENDOSCOPY;  Service: Gastroenterology;  Laterality: N/A;   CYSTOSCOPY  09/19/2023   Procedure: CYSTOSCOPY;  Surgeon: Twylla Glendia BROCKS, MD;  Location: [facility] ORS;  Service: Urology;;   EUS N/A 04/19/2022   Procedure: UPPER ENDOSCOPIC ULTRASOUND (EUS) LINEAR;  Surgeon: Elta Fonda SQUIBB, MD;  Location: [facility] ENDOSCOPY;  Service: Gastroenterology;  Laterality: N/A;  Requesting 1p slot   HEMOSTASIS CLIP PLACEMENT  09/05/2022   Procedure: HEMOSTASIS CLIP PLACEMENT;  Surgeon: Aundria, Ladell POUR, MD;  Location: [facility] ENDOSCOPY;  Service: Gastroenterology;;   POLYPECTOMY  09/05/2022   Procedure: POLYPECTOMY;  Surgeon: Aundria, Ladell POUR, MD;  Location: [facility] ENDOSCOPY;  Service: Gastroenterology;;   PROSTATE BIOPSY N/A 02/26/2023   Procedure: PROSTATE BIOPSY;  Surgeon: Twylla Glendia BROCKS, MD;  Location: [facility] ORS;  Service: Urology;  Laterality: N/A;    TRANSRECTAL ULTRASOUND N/A 02/26/2023   Procedure: TRANSRECTAL ULTRASOUND;  Surgeon: Twylla Glendia BROCKS, MD;  Location: [facility] ORS;  Service: Urology;  Laterality: N/A;   TRANSURETHRAL RESECTION OF BLADDER TUMOR  09/19/2023   Procedure: TURBT (TRANSURETHRAL RESECTION OF BLADDER TUMOR);  Surgeon: Twylla Glendia BROCKS, MD;  Location: [facility] ORS;  Service: Urology;;     Family History: Family History  Problem Relation Age of Onset   Diabetes Mellitus II Mother      Social History: Social History   Socioeconomic History   Marital status: Single    Spouse name: Not on file   Number of children: Not on file   Years of education: Not on file   Highest education level: Not on file  Occupational History   Occupation: disabled  Tobacco Use   Smoking status: Every Day    Current packs/day: 1.00    Average packs/day: 1 pack/day for 34.0 years (34.0 ttl pk-yrs)    Types: Cigarettes    Passive exposure: Current   Smokeless tobacco: Never  Vaping Use   Vaping status: Never Used  Substance and Sexual Activity   Alcohol use: No   Drug use: Not Currently    Types: Cocaine    Comment: 01/22/2012 last used cocaine ~ 2012   Sexual activity: Not Currently  Other Topics Concern   Not on file  Social History Narrative  Not on file   Social Drivers of Health   Financial Resource Strain: Not on file  Food Insecurity: No Food Insecurity (10/08/2023)   Hunger Vital Sign    Worried About Running Out of Food in the Last Year: Never true    Ran Out of Food in the Last Year: Never true  Transportation Needs: No Transportation Needs (10/08/2023)   PRAPARE - Administrator, Civil Service (Medical): No    Lack of Transportation (Non-Medical): No  Physical Activity: Not on file  Stress: Not on file  Social Connections: Not on file  Intimate Partner Violence: Not At Risk (10/08/2023)   Humiliation, Afraid, Rape, and Kick questionnaire    Fear of Current or Ex-Partner: No    Emotionally Abused:  No    Physically Abused: No    Sexually Abused: No     Review of Systems: As per HPI  Vital Signs: Blood pressure 129/66, pulse 67, temperature 97.9 F (36.6 C), resp. rate 15, height 5' 8 (1.727 m), weight 40.1 kg, SpO2 100%.  Weight trends: Filed Weights   10/08/23 1342  Weight: 40.1 kg     Physical Exam: General: No acute distress  Head: Normocephalic, atraumatic. Moist oral mucosal membranes  Eyes: Anicteric  Neck: Supple  Lungs:  Clear to auscultation, normal effort  Heart: S1S2 no rubs  Abdomen:  Soft, nontender, bowel sounds present  Extremities: No peripheral edema.  Neurologic: Awake, alert, following commands  Skin: No acute rash  Access: No hemodialysis access    Lab results: Basic Metabolic Panel: Recent Labs  Lab 10/08/23 1136 10/08/23 1400 10/09/23 0432  NA 132* 135 136  K 5.7* 5.3* 6.3*  CL 99 100 98  CO2 19* 19* 18*  GLUCOSE 117* 115* 125*  BUN 99* 97* 98*  CREATININE 13.90* 13.76* 13.87*  CALCIUM  9.0 8.9 8.6*    Liver Function Tests: Recent Labs  Lab 10/08/23 1136 10/08/23 1400 10/09/23 0432  AST 12* 15 16  ALT 7 7 7   ALKPHOS 86 84 72  BILITOT 0.4 0.5 0.4  PROT 7.1 6.9 6.1*  ALBUMIN  2.3* 2.2* 1.9*   No results for input(s): LIPASE, AMYLASE in the last 168 hours. No results for input(s): AMMONIA in the last 168 hours.  CBC: Recent Labs  Lab 10/08/23 1136 10/08/23 1400 10/09/23 0432  WBC 11.1* 11.1* 10.1  NEUTROABS 8.6*  --   --   HGB 8.2* 8.1* 7.2*  HCT 27.1* 27.4* 22.7*  MCV 89.7 90.1 86.3  PLT 266 251 265    Cardiac Enzymes: No results for input(s): CKTOTAL, CKMB, CKMBINDEX, TROPONINI in the last 168 hours.  BNP: Invalid input(s): POCBNP  CBG: Recent Labs  Lab 10/08/23 1823 10/08/23 1933 10/09/23 0730 10/09/23 1134  GLUCAP 121* 154* 102* 110*    Microbiology: Results for orders placed or performed during the hospital encounter of 09/16/23  Blood culture (routine x 2)     Status: None    Collection Time: 09/16/23  4:40 PM   Specimen: BLOOD  Result Value Ref Range Status   Specimen Description BLOOD BLOOD RIGHT ARM  Final   Special Requests BOTTLES DRAWN AEROBIC AND ANAEROBIC BCLV  Final   Culture   Final    NO GROWTH 5 DAYS Performed at [facility], [address] [place], [place] [address]    Report Status 09/21/2023 FINAL  Final  Blood culture (routine x 2)     Status: None   Collection Time: 09/16/23  5:10 PM  Specimen: BLOOD  Result Value Ref Range Status   Specimen Description BLOOD BLOOD LEFT FOREARM  Final   Special Requests BOTTLES DRAWN AEROBIC AND ANAEROBIC BCLV  Final   Culture   Final    NO GROWTH 5 DAYS Performed at [facility], [address] [place], [place] [address]    Report Status 09/21/2023 FINAL  Final  Urine Culture     Status: Abnormal   Collection Time: 09/16/23 10:19 PM   Specimen: Urine, Clean Catch  Result Value Ref Range Status   Specimen Description   Final    URINE, CLEAN CATCH Performed at [facility], [address], [place], [place] [address]    Special Requests   Final    NONE Performed at [facility], [address] [place], [place] [address]    Culture (A)  Final    >=100,000 COLONIES/mL KLEBSIELLA PNEUMONIAE >=100,000 COLONIES/mL ENTEROCOCCUS FAECALIS    Report Status 09/20/2023 FINAL  Final   Organism ID, Bacteria KLEBSIELLA PNEUMONIAE (A)  Final   Organism ID, Bacteria ENTEROCOCCUS FAECALIS (A)  Final      Susceptibility   Enterococcus faecalis - MIC*    AMPICILLIN  <=2 SENSITIVE Sensitive     NITROFURANTOIN <=16 SENSITIVE Sensitive     VANCOMYCIN 1 SENSITIVE Sensitive     * >=100,000 COLONIES/mL ENTEROCOCCUS FAECALIS   Klebsiella pneumoniae - MIC*    AMPICILLIN  >=32 RESISTANT Resistant     CEFAZOLIN  Value in next row Sensitive      2 SENSITIVEThis is a modified FDA-approved test that has been validated and its performance characteristics determined by the  reporting laboratory.  This laboratory is certified under the Clinical Laboratory Improvement Amendments CLIA as qualified to perform high complexity clinical laboratory testing.    CEFEPIME  Value in next row Sensitive      2 SENSITIVEThis is a modified FDA-approved test that has been validated and its performance characteristics determined by the reporting laboratory.  This laboratory is certified under the Clinical Laboratory Improvement Amendments CLIA as qualified to perform high complexity clinical laboratory testing.    ERTAPENEM Value in next row Sensitive      2 SENSITIVEThis is a modified FDA-approved test that has been validated and its performance characteristics determined by the reporting laboratory.  This laboratory is certified under the Clinical Laboratory Improvement Amendments CLIA as qualified to perform high complexity clinical laboratory testing.    CEFTRIAXONE  Value in next row Sensitive      2 SENSITIVEThis is a modified FDA-approved test that has been validated and its performance characteristics determined by the reporting laboratory.  This laboratory is certified under the Clinical Laboratory Improvement Amendments CLIA as qualified to perform high complexity clinical laboratory testing.    CIPROFLOXACIN  Value in next row Sensitive      2 SENSITIVEThis is a modified FDA-approved test that has been validated and its performance characteristics determined by the reporting laboratory.  This laboratory is certified under the Clinical Laboratory Improvement Amendments CLIA as qualified to perform high complexity clinical laboratory testing.    GENTAMICIN  Value in next row Sensitive      2 SENSITIVEThis is a modified FDA-approved test that has been validated and its performance characteristics determined by the reporting laboratory.  This laboratory is certified under the Clinical Laboratory Improvement Amendments CLIA as qualified to perform high complexity clinical laboratory testing.     NITROFURANTOIN Value in next row Resistant      2 SENSITIVEThis is a modified FDA-approved test that  has been validated and its performance characteristics determined by the reporting laboratory.  This laboratory is certified under the Clinical Laboratory Improvement Amendments CLIA as qualified to perform high complexity clinical laboratory testing.    TRIMETH /SULFA  Value in next row Sensitive      2 SENSITIVEThis is a modified FDA-approved test that has been validated and its performance characteristics determined by the reporting laboratory.  This laboratory is certified under the Clinical Laboratory Improvement Amendments CLIA as qualified to perform high complexity clinical laboratory testing.    AMPICILLIN /SULBACTAM Value in next row Sensitive      2 SENSITIVEThis is a modified FDA-approved test that has been validated and its performance characteristics determined by the reporting laboratory.  This laboratory is certified under the Clinical Laboratory Improvement Amendments CLIA as qualified to perform high complexity clinical laboratory testing.    PIP/TAZO Value in next row Sensitive ug/mL     <=4 SENSITIVEThis is a modified FDA-approved test that has been validated and its performance characteristics determined by the reporting laboratory.  This laboratory is certified under the Clinical Laboratory Improvement Amendments CLIA as qualified to perform high complexity clinical laboratory testing.    MEROPENEM Value in next row Sensitive      <=4 SENSITIVEThis is a modified FDA-approved test that has been validated and its performance characteristics determined by the reporting laboratory.  This laboratory is certified under the Clinical Laboratory Improvement Amendments CLIA as qualified to perform high complexity clinical laboratory testing.    * >=100,000 COLONIES/mL KLEBSIELLA PNEUMONIAE    Coagulation Studies: Recent Labs    10/09/23 0432  LABPROT 18.2*  INR 1.4*     Urinalysis: Recent Labs    10/08/23 1648  COLORURINE YELLOW*  LABSPEC 1.024  PHURINE 6.0  GLUCOSEU NEGATIVE  HGBUR SMALL*  BILIRUBINUR NEGATIVE  KETONESUR NEGATIVE  PROTEINUR 30*  NITRITE NEGATIVE  LEUKOCYTESUR SMALL*      Imaging: CT Renal Stone Study Result Date: 10/08/2023 CLINICAL DATA:  Abdominal and flank pain EXAM: CT ABDOMEN AND PELVIS WITHOUT CONTRAST TECHNIQUE: Multidetector CT imaging of the abdomen and pelvis was performed following the standard protocol without IV contrast. RADIATION DOSE REDUCTION: This exam was performed according to the departmental dose-optimization program which includes automated exposure control, adjustment of the mA and/or kV according to patient size and/or use of iterative reconstruction technique. COMPARISON:  CT abdomen and pelvis 09/16/2023. FINDINGS: Lower chest: There is scarring or atelectasis in the right lung base. Hepatobiliary: Gallstones are present. There is no biliary ductal dilatation. The liver is within normal limits. Pancreas: There are dense calcifications throughout the pancreas compatible with chronic pancreatitis similar to the prior study. Cystic change in the region of the pancreatic head/duodenal measures 2.5 cm and has slightly increased in size. No acute inflammatory change. Spleen: Normal in size without focal abnormality. Adrenals/Urinary Tract: The bilateral adrenal glands are within normal limits. There is moderate bilateral hydroureteronephrosis to the level of the bladder without obstructing calculus, similar to the prior study. The bladder is distended with superior bladder diverticulum containing air-fluid levels similar to the prior study. The bladder is filled with heterogeneous hyperdense material and air. The bladder wall appears irregular and thickened, particularly inferiorly and anteriorly. There also a few punctate calcifications in the bladder. Foley catheter is present. Stomach/Bowel: Stomach is within normal  limits. No evidence of bowel wall thickening, distention, or inflammatory changes. The appendix is not visualized. Scattered colonic diverticula are present. Vascular/Lymphatic: Aortic atherosclerosis. No enlarged abdominal or pelvic lymph nodes. Reproductive: Prostate  gland is mildly enlarged. Other: There is no ascites or focal abdominal wall hernia. Musculoskeletal: No acute or significant osseous findings. IMPRESSION: 1. Moderate bilateral hydroureteronephrosis to the level of the bladder without obstructing calculus. The bladder is distended with heterogeneous hyperdense material and air. The bladder wall appears irregular and thickened, particularly inferiorly and anteriorly. Findings are concerning for bladder neoplasm. 2. Cholelithiasis. 3. Chronic pancreatitis. 4. Cystic change in the region of the pancreatic head/duodenum has slightly increased in size. This can be further evaluated with MRI. 5. Colonic diverticulosis. 6. Aortic atherosclerosis. Aortic Atherosclerosis (ICD10-I70.0). Electronically Signed   By: Greig Pique M.D.   On: 10/08/2023 17:30     Assessment & Plan: Pt is a 63 y.o. 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   1.  Acute kidney injury on chronic kidney disease stage IV baseline eGFR 23. 2.  Hyperkalemia serum potassium 6.3. 3.  Acute metabolic acidosis with serum bicarbonate of 18. 4.  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  Plan: We were consulted for evaluation management of acute kidney injury in the setting of known chronic kidney disease stage IV.  In late August his baseline creatinine ranged between 2.5-2.7 with an eGFR of 23.  It appears that he has muscle-invasive squamous cell carcinoma of the  bladder and has been seen by urology.  He was not felt to be a surgical candidate here at our institution.  They did mention that bilateral nephrostomies could be considered.  Suspect that obstruction is playing a role in his acute kidney injury now.  His creatinine remains elevated at 13.8.  Would recommend further goals of care discussion with the patient.  If he is not a candidate for surgical therapy then it would not make sense to pursue renal placement therapy in the form of hemodialysis.  Patient's hyperkalemia has been treated with Lokelma  today.  Continue to monitor serum potassium closely.  Further plan based upon goals of care discussion.  If aggressive care desired patient may need to be transferred to tertiary care center.

## 2023-10-10 DIAGNOSIS — N184 Chronic kidney disease, stage 4 (severe): Secondary | ICD-10-CM

## 2023-10-10 DIAGNOSIS — Z789 Other specified health status: Secondary | ICD-10-CM

## 2023-10-10 DIAGNOSIS — N179 Acute kidney failure, unspecified: Secondary | ICD-10-CM | POA: Diagnosis not present

## 2023-10-10 DIAGNOSIS — Z66 Do not resuscitate: Secondary | ICD-10-CM | POA: Diagnosis not present

## 2023-10-10 DIAGNOSIS — Z515 Encounter for palliative care: Secondary | ICD-10-CM | POA: Diagnosis not present

## 2023-10-10 DIAGNOSIS — D631 Anemia in chronic kidney disease: Secondary | ICD-10-CM

## 2023-10-10 DIAGNOSIS — Z7189 Other specified counseling: Secondary | ICD-10-CM | POA: Diagnosis not present

## 2023-10-10 LAB — GLUCOSE, CAPILLARY
Glucose-Capillary: 123 mg/dL — ABNORMAL HIGH (ref 70–99)
Glucose-Capillary: 151 mg/dL — ABNORMAL HIGH (ref 70–99)
Glucose-Capillary: 109 mg/dL — ABNORMAL HIGH (ref 70–99)
Glucose-Capillary: 156 mg/dL — ABNORMAL HIGH (ref 70–99)

## 2023-10-10 LAB — CBC
HCT: 24 % — ABNORMAL LOW (ref 39.0–52.0)
Hemoglobin: 7.5 g/dL — ABNORMAL LOW (ref 13.0–17.0)
MCH: 27.1 pg (ref 26.0–34.0)
MCHC: 31.3 g/dL (ref 30.0–36.0)
MCV: 86.6 fL (ref 80.0–100.0)
Platelets: 255 K/uL (ref 150–400)
RBC: 2.77 MIL/uL — ABNORMAL LOW (ref 4.22–5.81)
RDW: 15.5 % (ref 11.5–15.5)
WBC: 10.9 K/uL — ABNORMAL HIGH (ref 4.0–10.5)
nRBC: 0 % (ref 0.0–0.2)

## 2023-10-10 LAB — COMPREHENSIVE METABOLIC PANEL WITH GFR
ALT: 8 U/L (ref 0–44)
AST: 13 U/L — ABNORMAL LOW (ref 15–41)
Albumin: 1.9 g/dL — ABNORMAL LOW (ref 3.5–5.0)
Alkaline Phosphatase: 70 U/L (ref 38–126)
Anion gap: 17 — ABNORMAL HIGH (ref 5–15)
BUN: 101 mg/dL — ABNORMAL HIGH (ref 8–23)
CO2: 19 mmol/L — ABNORMAL LOW (ref 22–32)
Calcium: 8.5 mg/dL — ABNORMAL LOW (ref 8.9–10.3)
Chloride: 99 mmol/L (ref 98–111)
Creatinine, Ser: 14.23 mg/dL — ABNORMAL HIGH (ref 0.61–1.24)
GFR, Estimated: 4 mL/min — ABNORMAL LOW (ref >60)
Glucose, Bld: 124 mg/dL — ABNORMAL HIGH (ref 70–99)
Potassium: 6 mmol/L — ABNORMAL HIGH (ref 3.5–5.1)
Sodium: 135 mmol/L (ref 135–145)
Total Bilirubin: 0.5 mg/dL (ref 0.0–1.2)
Total Protein: 6 g/dL — ABNORMAL LOW (ref 6.5–8.1)

## 2023-10-10 LAB — HEMOGLOBIN A1C
Hgb A1c MFr Bld: 6.1 % — ABNORMAL HIGH (ref 4.8–5.6)
Mean Plasma Glucose: 128 mg/dL

## 2023-10-10 LAB — POTASSIUM: Potassium: 5.9 mmol/L — ABNORMAL HIGH (ref 3.5–5.1)

## 2023-10-10 MED ORDER — PATIROMER SORBITEX CALCIUM 8.4 G PO PACK
25.2000 g | PACK | Freq: Every day | ORAL | Status: DC
  Administered 2023-10-10 – 2023-10-11 (×2): 25.2 g via ORAL
  Filled 2023-10-10 (×3): qty 3

## 2023-10-10 MED ORDER — LOPERAMIDE HCL 2 MG PO CAPS
2.0000 mg | ORAL_CAPSULE | ORAL | Status: DC | PRN
  Administered 2023-10-10: 2 mg via ORAL
  Filled 2023-10-10: qty 1

## 2023-10-10 MED ORDER — CALCIUM GLUCONATE-NACL 1-0.675 GM/50ML-% IV SOLN
1.0000 g | Freq: Once | INTRAVENOUS | Status: AC
  Administered 2023-10-10: 1000 mg via INTRAVENOUS
  Filled 2023-10-10: qty 50

## 2023-10-10 MED ORDER — CALCIUM GLUCONATE-NACL 1-0.675 GM/50ML-% IV SOLN: 1.0000 g | Freq: Once | INTRAVENOUS | Status: DC

## 2023-10-10 MED ORDER — SODIUM ZIRCONIUM CYCLOSILICATE 10 G PO PACK
10.0000 g | PACK | Freq: Every day | ORAL | Status: DC
  Administered 2023-10-10 – 2023-10-11 (×2): 10 g via ORAL
  Filled 2023-10-10 (×2): qty 1

## 2023-10-10 NOTE — Progress Notes (Signed)
 [facility]  ROUNDING NOTE   Subjective:   Patient seen laying in bed Ill appearing Room air  Creatinine 14.23 Potassium 6.0  Objective:  Vital signs in last 24 hours:  Temp:  [97.9 F (36.6 C)-99.1 F (37.3 C)] 98.5 F (36.9 C) (09/04 0752) Pulse Rate:  [62-77] 72 (09/04 0752) Resp:  [15-20] 16 (09/04 0752) BP: (124-155)/(66-82) 124/69 (09/04 0752) SpO2:  [100 %] 100 % (09/04 0752)  Weight change:  Filed Weights   10/08/23 1342  Weight: 40.1 kg    Intake/Output: I/O last 3 completed shifts: In: 480 [P.O.:480] Out: -    Intake/Output this shift:  No intake/output data recorded.  Physical Exam: General: NAD, ill appearing  Head: Normocephalic, atraumatic. Moist oral mucosal membranes  Eyes: Anicteric  Lungs:  Clear to auscultation, room air  Heart: Regular rate and rhythm  Abdomen:  Soft, nontender  Extremities:  No peripheral edema.  Neurologic: Awake, alert, conversant  Skin: Warm,dry, no rash  Access: None    Basic Metabolic Panel: Recent Labs  Lab 10/08/23 1136 10/08/23 1400 10/09/23 0432 10/09/23 1713 10/10/23 0838  NA 132* 135 136  --  135  K 5.7* 5.3* 6.3* 6.2* 6.0*  CL 99 100 98  --  99  CO2 19* 19* 18*  --  19*  GLUCOSE 117* 115* 125*  --  124*  BUN 99* 97* 98*  --  101*  CREATININE 13.90* 13.76* 13.87*  --  14.23*  CALCIUM  9.0 8.9 8.6*  --  8.5*    Liver Function Tests: Recent Labs  Lab 10/08/23 1136 10/08/23 1400 10/09/23 0432 10/10/23 0838  AST 12* 15 16 13*  ALT 7 7 7 8   ALKPHOS 86 84 72 70  BILITOT 0.4 0.5 0.4 0.5  PROT 7.1 6.9 6.1* 6.0*  ALBUMIN  2.3* 2.2* 1.9* 1.9*   No results for input(s): LIPASE, AMYLASE in the last 168 hours. No results for input(s): AMMONIA in the last 168 hours.  CBC: Recent Labs  Lab 10/08/23 1136 10/08/23 1400 10/09/23 0432 10/10/23 0838  WBC 11.1* 11.1* 10.1 10.9*  NEUTROABS 8.6*  --   --   --   HGB 8.2* 8.1* 7.2* 7.5*  HCT 27.1* 27.4* 22.7* 24.0*  MCV 89.7 90.1  86.3 86.6  PLT 266 251 265 255    Cardiac Enzymes: No results for input(s): CKTOTAL, CKMB, CKMBINDEX, TROPONINI in the last 168 hours.  BNP: Invalid input(s): POCBNP  CBG: Recent Labs  Lab 10/09/23 0730 10/09/23 1134 10/09/23 1542 10/09/23 1958 10/10/23 0751  GLUCAP 102* 110* 119* 97 123*    Microbiology: Results for orders placed or performed during the hospital encounter of 10/08/23  Urine Culture     Status: None (Preliminary result)   Collection Time: 10/08/23  4:48 PM   Specimen: Urine, Random  Result Value Ref Range Status   Specimen Description   Final    URINE, RANDOM Performed at [facility], [address], [place], [place] [address]    Special Requests   Final    NONE Reflexed from [id number] Performed at [facility], [address], [place], [place] [address]    Culture   Final    CULTURE REINCUBATED FOR BETTER GROWTH Performed at [facility], [address], [place], [place] [address]    Report Status PENDING  Incomplete    Coagulation Studies: Recent Labs    10/09/23 0432  LABPROT 18.2*  INR 1.4*    Urinalysis: Recent Labs  10/08/23 1648  COLORURINE YELLOW*  LABSPEC 1.024  PHURINE 6.0  GLUCOSEU NEGATIVE  HGBUR SMALL*  BILIRUBINUR NEGATIVE  KETONESUR NEGATIVE  PROTEINUR 30*  NITRITE NEGATIVE  LEUKOCYTESUR SMALL*      Imaging: CT Renal Stone Study Result Date: 10/08/2023 CLINICAL DATA:  Abdominal and flank pain EXAM: CT ABDOMEN AND PELVIS WITHOUT CONTRAST TECHNIQUE: Multidetector CT imaging of the abdomen and pelvis was performed following the standard protocol without IV contrast. RADIATION DOSE REDUCTION: This exam was performed according to the departmental dose-optimization program which includes automated exposure control, adjustment of the mA and/or kV according to patient size and/or use of iterative reconstruction technique. COMPARISON:  CT abdomen and pelvis 09/16/2023. FINDINGS: Lower chest:  There is scarring or atelectasis in the right lung base. Hepatobiliary: Gallstones are present. There is no biliary ductal dilatation. The liver is within normal limits. Pancreas: There are dense calcifications throughout the pancreas compatible with chronic pancreatitis similar to the prior study. Cystic change in the region of the pancreatic head/duodenal measures 2.5 cm and has slightly increased in size. No acute inflammatory change. Spleen: Normal in size without focal abnormality. Adrenals/Urinary Tract: The bilateral adrenal glands are within normal limits. There is moderate bilateral hydroureteronephrosis to the level of the bladder without obstructing calculus, similar to the prior study. The bladder is distended with superior bladder diverticulum containing air-fluid levels similar to the prior study. The bladder is filled with heterogeneous hyperdense material and air. The bladder wall appears irregular and thickened, particularly inferiorly and anteriorly. There also a few punctate calcifications in the bladder. Foley catheter is present. Stomach/Bowel: Stomach is within normal limits. No evidence of bowel wall thickening, distention, or inflammatory changes. The appendix is not visualized. Scattered colonic diverticula are present. Vascular/Lymphatic: Aortic atherosclerosis. No enlarged abdominal or pelvic lymph nodes. Reproductive: Prostate gland is mildly enlarged. Other: There is no ascites or focal abdominal wall hernia. Musculoskeletal: No acute or significant osseous findings. IMPRESSION: 1. Moderate bilateral hydroureteronephrosis to the level of the bladder without obstructing calculus. The bladder is distended with heterogeneous hyperdense material and air. The bladder wall appears irregular and thickened, particularly inferiorly and anteriorly. Findings are concerning for bladder neoplasm. 2. Cholelithiasis. 3. Chronic pancreatitis. 4. Cystic change in the region of the pancreatic  head/duodenum has slightly increased in size. This can be further evaluated with MRI. 5. Colonic diverticulosis. 6. Aortic atherosclerosis. Aortic Atherosclerosis (ICD10-I70.0). Electronically Signed   By: Greig Pique M.D.   On: 10/08/2023 17:30     Medications:     atorvastatin   20 mg Oral QHS   Chlorhexidine  Gluconate Cloth  6 each Topical Daily   dolutegravir -rilpivirine   1 tablet Oral Daily   finasteride   5 mg Oral Daily   folic acid   1 mg Oral Daily   heparin   5,000 Units Subcutaneous Q8H   insulin  aspart  0-6 Units Subcutaneous TID WC   metoprolol  succinate  100 mg Oral Daily   mirtazapine   7.5 mg Oral QHS   patiromer   25.2 g Oral Daily   sodium bicarbonate   650 mg Oral BID   sodium zirconium cyclosilicate   10 g Oral Daily   sucralfate   1 g Oral BID   tamsulosin   0.4 mg Oral QPC supper   acetaminophen  **OR** acetaminophen , loperamide , morphine  injection, ondansetron  **OR** ondansetron  (ZOFRAN ) IV, oxyCODONE , polyethylene glycol  Assessment/ Plan:  Mr. Jay Flores is a 63 y.o.  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  Acute kidney injury on chronic kidney disease stage IV baseline eGFR 23. 2.  Hyperkalemia serum potassium 6.3. 3.  Acute metabolic acidosis with serum bicarbonate of 18. 4.  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   Lab Results  Component Value Date   CREATININE 14.23 (H) 10/10/2023   CREATININE 13.87 (H) 10/09/2023   CREATININE 13.76 (H) 10/08/2023    Intake/Output Summary (Last 24 hours) at 10/10/2023 1017 Last data filed at 10/09/2023  2150 Gross per 24 hour  Intake 240 ml  Output --  Net 240 ml   Plan: We were consulted for evaluation management of acute kidney injury in the setting of known chronic kidney disease stage IV.  In late August his baseline creatinine ranged between 2.5-2.7 with an eGFR of 23.  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  He was not felt to be a surgical candidate here at our institution.  They did mention that bilateral nephrostomies could be considered.  Suspect that obstruction is playing a role in his acute kidney injury now.    Update: Creatinine remains elevated today, 14.23. Urine output noted in foley bag.  Potassium remains elevated, 6.0. Will order Veltassa  25.2g and lokelma  10g daily until potassium below 4.8.  Sodium bicarb 19 today. Continue oral supplementation.  We continue to recommend goals of care discussion with the patient.  If he is not a candidate for surgical therapy then it would not make sense to pursue renal placement therapy in the form of hemodialysis.   Further plan based upon goals of care discussion.  If aggressive care desired patient may need to be transferred to tertiary care center.    LOS: 2 Luretta Everly 9/4/202510:17 AM

## 2023-10-10 NOTE — Progress Notes (Signed)
 Daily Progress Note   Date: 10/10/2023   Patient Name: Jay Flores  DOB: 03-16-1960  MRN: [id number]  Age / Sex: 63 y.o., male  Attending Physician: Trudy Anthony HERO, MD Primary Care Physician: [name], MD Admit Date: 10/08/2023 Length of Stay: 2 days  Reason for Follow-up: Establishing goals of care  Past Medical History:  Diagnosis Date  . Acute renal failure (HCC)   . Altered mental status 01/20/2012  . Anemia   . Brain lesion   . Cholelithiasis   . Chronic indwelling Foley catheter   . CKD (chronic kidney disease) stage 3, GFR 30-59 ml/min (HCC)   . COPD (chronic obstructive pulmonary disease) (HCC)   . Diabetic neuropathy (HCC)   . DKA (diabetic ketoacidosis) (HCC)   . DM (diabetes mellitus), type 2 (HCC)   . Elevated PSA   . Gait instability    uses walker  . H/O urinary retention   . Hepatitis C infection    s/p Harvoni  . History of cocaine use    last used in 2012  . HIV (human immunodeficiency virus infection) (HCC)   . HIV dementia (HCC)   . Hypertension   . IPMN (intraductal papillary mucinous neoplasm)   . Lives in group home    [facility]  . Metabolic acidosis   . Pancreatic cyst   . Pneumonia   . Thrombocytopenia (HCC)   . Tobacco use     Subjective:   Subjective: Chart Reviewed. Updates received. Patient Assessed. Created space and opportunity for patient  and family to explore thoughts and feelings regarding current medical situation.  Today's Discussion: Today before meeting with the patient/family, I reviewed the chart notes including nephrology note from yesterday, TOC note from yesterday, nursing note from today, internal medicine note from today. I also reviewed vital signs, nursing flowsheets, medication administrations record, labs, and imaging. Labs reviewed include mild bump in white blood cell count from 10.1 yesterday to abnormal at 10.9 today in the setting of severe AKI with hydronephrosis.   Creatinine remained stable at 7.5 compared to 7.2 yesterday.  CMP shows improvement in hyperkalemia in the setting of severe AKI from 6.3 yesterday to 6.0 today after treatment.  Creatinine unfortunately is climbing and is 14.23 today.  Today saw the patient at bedside, he was sleeping but easily awoken to voice and light touch.  No family is present.  He denies pain, nausea, vomiting.  He has no other complaints.  I informed him I would call his sister again today and he agreed.  He knows his name and where he is at but is unaware of specific details around the situation.  Because of this I am not sure he has capacity to make healthcare decisions.  I called and left his sister [name] a voicemail for call back.  I also tried to call his sister Nichole although the line rang persistently with no option for voicemail.  Shortly after that sister [name] called back.  We spent time discussing the patient's current clinical situation.  We reviewed his previous admission and palliative visits and she agreed that at last visit they have elected DNR/DNI.  After discussing CODE STATUS and his acute situation she agrees that he would want DNR/DNI still.  She was unaware of the specifics around his admission.  We discussed ongoing bladder cancer, malnutrition, poor treatment options for cancer.  We discussed now obstruction due to bladder cancer with bilateral hydronephrosis and severe kidney failure.  Urology has offered possible bilateral nephrostomy tubes with IR, although it is unclear if this will make significant improvement.  After discussing the patient's sister is open to bilateral nephrostomy tubes, if that can be completed/she is a candidate.  I shared that this would need to be discussed further with the medical team and they may call for questions or concerns.  She understands.  Finally we talked about the big picture, the fact that he does not have viable options for cancer treatment and his severe  illness/injury.  I shared that if the patient continues to decline that it would necessitate a discussion about hospice and comfort care.  The patient's sister is open to these conversations if he fails to improve.  I shared that palliative medicine will continue to follow.  I shared that we, along with the medical team, would likely continue attempts to keep in contact and she expressed appreciation for this.  Afterward I debriefed with Dr. Trudy with the hospitalist team.  She will contact IR for evaluation.  She states that when she asked the patient if he would want nephrostomy tubes or dialysis he said no, although do not feel he really has capacity.  I provided emotional and general support through therapeutic listening, empathy, sharing of stories, and other techniques. I answered all questions and addressed all concerns to the best of my ability.  Review of Systems  Constitutional:        Denies pain in general  Respiratory:  Negative for shortness of breath.   Gastrointestinal:  Negative for nausea and vomiting.    Objective:   Primary Diagnoses: Present on Admission: . AKI (acute kidney injury) (HCC) . Bladder mass . Anemia in chronic kidney disease (CKD) . Hepatitis C infection . Acute kidney injury superimposed on stage 3b chronic kidney disease (HCC) . Bilateral hydronephrosis . DVT (deep venous thrombosis)_left leg . Pulmonary embolism (HCC) . HTN (hypertension) . Type II diabetes mellitus with renal manifestations (HCC) . HLD (hyperlipidemia) . HIV (human immunodeficiency virus infection) (HCC) . Protein-calorie malnutrition, severe   Vital Signs:  BP 124/69 (BP Location: Right Arm)   Pulse 72   Temp 98.5 F (36.9 C)   Resp 16   Ht 5' 8 (1.727 m)   Wt 40.1 kg   SpO2 100%   BMI 13.43 kg/m   Physical Exam Vitals and nursing note reviewed.  Constitutional:      General: He is sleeping. He is not in acute distress. HENT:     Head: Normocephalic and  atraumatic.  Cardiovascular:     Rate and Rhythm: Normal rate.  Pulmonary:     Effort: Pulmonary effort is normal. No respiratory distress.  Abdominal:     General: Abdomen is flat. Bowel sounds are normal. There is no distension.     Palpations: Abdomen is soft.     Tenderness: There is no abdominal tenderness.  Skin:    General: Skin is warm and dry.  Neurological:     General: No focal deficit present.     Mental Status: He is easily aroused.  Psychiatric:        Mood and Affect: Mood normal.        Behavior: Behavior normal.     Palliative Assessment/Data: 40-50%   Advanced Care Planning:   Existing Vynca/ACP Documentation: Goals of care document signed 09/24/2023  Primary Decision Maker: NEXT OF KIN  Pertinent diagnosis: Severe AKI, bladder cancer, COPD, HIV, hepatitis C  The patient and/or family consented to  a voluntary Advance Care Planning Conversation over the phone. Individuals present for the conversation: Sister [name] Ewing, Camellia Kays, NP  Summary of the conversation: Discussed acute situation and review of chronic illness.  Discussed CODE STATUS, potential options including bilateral nephrostomy tubes, discussed the bigger picture including possibility patient will continue to decline and will need further discussions/decisions.  Outcome of the conversations and/or documents completed: Transition to DNR/DNI, open to bilateral nephrostomy tubes if offered/available, agreeable to further goals of care conversations as clinical picture evolves.  I spent 20 minutes providing separately identifiable ACP services with the patient and/or surrogate decision maker in a voluntary, in-person conversation discussing the patient's wishes and goals as detailed in the above note.  Assessment & Plan:   HPI/Patient Profile:  63 y.o. male  with past medical history of diabetes, bladder cancer under the care of oncologist, hepatitis C, COPD, HIV, HTN, DVT/PE on Eliquis  who was  brought in by ambulance from [facility] care facility group home for abnormal creatinine of 13.9, baseline creatinine of 3. Patient also complained of abdominal pain, back pain.  He was admitted on 10/08/2023 with AKI in the setting of bladder cancer status post bilateral hydronephrosis and recently refused PCN, history of DVT/PE, and others.   SUMMARY OF RECOMMENDATIONS   Changed to DNR/DNI Family open to bilateral nephrostomy tubes if offered/available Continue goals of care conversations pending evolution of the clinical picture Palliative medicine will continue to follow  Symptom Management:  Per primary team Palliative medicine is available to assist as needed  Code Status: DNR - Limited (DNR/DNI)  Prognosis: < 6 months  Discharge Planning: To Be Determined  Discussed with: Patient, family, medical team, nursing team  Thank you for allowing us  to participate in the care of ARBY DAHIR PMT will continue to support holistically.  Billing based on MDM: High  Problems Addressed: One acute or chronic illness or injury that poses a threat to life or bodily function  Amount and/or Complexity of Data: Category 1:Review of prior external note(s) from each unique source, Review of the result(s) of each unique test, and Assessment requiring an independent historian(s) and Category 3:Discussion of management or test interpretation with external physician/other qualified health care professional/appropriate source (not separately reported)  Risks: Decision not to resuscitate or to de-escalate care because of poor prognosis  Detailed review of medical records (labs, imaging, vital signs), medically appropriate exam, discussed with treatment team, counseling and education to patient, family, & staff, documenting clinical information, medication management, coordination of care  Camellia Kays, NP Palliative Medicine Team  Team Phone # [phone] (Nights/Weekends)  10/04/2020, 8:17 AM

## 2023-10-10 NOTE — Plan of Care (Signed)

## 2023-10-10 NOTE — Progress Notes (Addendum)
  IR BRIEF PROGRESS NOTE:  IR was consulted for bilateral nephrostomy tube placement, as requested by patient's sister. Patient is only alert x1, and unable to provide consent for himself. He is diagnosed with bladder cancer with bilateral malignant ureteral obstruction with moderate bilateral hydroureteronephrosis, with notable AKI. Creatinine today is 14.23.   I spoke with patient's sister by phone, provided detailed explanation for the procedure, along with risks of placement. I also informed her of the following: - Patient would necessitate regular bilateral nephrostomy tube exchanges every 6-8 weeks, for as long as the drains are maintained in place. This is typically done with local anesthetic only, in the hospital setting; - Routine drain care would include dressing changes every 48-72 hours, or whenever soiled, as well as daily flushing of both drains with NS flushes; - If consent is provided for procedure at any time, consent can be rescinded at anytime as well, up until the tubes are in place; - Patient would need to be NPO for 8 hours prior to placement; - IR does not place nephrostomy tubes over the weekend unless emergent; - Patient may be eligible for home health or palliative/hospice care to assist with management of dressing changes/ routine drain care, or other related services. Patient's sister may speak with Care Team to have this process initiated while patient is inpatient; - Delaying care may have notably detrimental impacts on patient's overall health, as he is acutely uremic, and worsening.  IR will reach back out to patient's sister, as requested, on Monday 9/8 for further discussion of her wishes for her brother and possible consent. In an effort to facilitate possible placement on Monday 9/8, should consent be given, patient will be maintained NPO at midnight on 9/8, until a decision is made either way. Patient is on SQ Heparin  Q 8 hr, which will need to be discontinued 2-4  hours prior to IR proceeding with placement.  Patient's sister is amenable to this plan. Care team was advised.   Electronically Signed: Carlin DELENA Griffon, PA-C 10/10/2023, 3:22 PM

## 2023-10-10 NOTE — Plan of Care (Signed)
  Problem: Education: Goal: Ability to describe self-care measures that may prevent or decrease complications (Diabetes Survival Skills Education) will improve Outcome: Progressing   Problem: Coping: Goal: Ability to adjust to condition or change in health will improve Outcome: Progressing   Problem: Fluid Volume: Goal: Ability to maintain a balanced intake and output will improve Outcome: Progressing   Problem: Health Behavior/Discharge Planning: Goal: Ability to identify and utilize available resources and services will improve Outcome: Progressing   Problem: Nutritional: Goal: Maintenance of adequate nutrition will improve Outcome: Progressing   Problem: Skin Integrity: Goal: Risk for impaired skin integrity will decrease Outcome: Progressing   Problem: Tissue Perfusion: Goal: Adequacy of tissue perfusion will improve Outcome: Progressing   Problem: Education: Goal: Knowledge of General Education information will improve Description: Including pain rating scale, medication(s)/side effects and non-pharmacologic comfort measures Outcome: Progressing   Problem: Clinical Measurements: Goal: Ability to maintain clinical measurements within normal limits will improve Outcome: Progressing   Problem: Activity: Goal: Risk for activity intolerance will decrease Outcome: Progressing   Problem: Nutrition: Goal: Adequate nutrition will be maintained Outcome: Progressing   Problem: Coping: Goal: Level of anxiety will decrease Outcome: Progressing   Problem: Elimination: Goal: Will not experience complications related to bowel motility Outcome: Progressing   Problem: Safety: Goal: Ability to remain free from injury will improve Outcome: Progressing   Problem: Skin Integrity: Goal: Risk for impaired skin integrity will decrease Outcome: Progressing

## 2023-10-10 NOTE — Progress Notes (Signed)
 PROGRESS NOTE    Jay Flores  FMW:[id number] DOB: 25-Jul-1960 DOA: 10/08/2023 PCP: [name], MD  Assessment & Plan:   Principal Problem:   AKI (acute kidney injury) (HCC) Active Problems:   Acute kidney injury superimposed on stage 3b chronic kidney disease (HCC)   Bilateral hydronephrosis   Bladder mass   DVT (deep venous thrombosis)_left leg   Pulmonary embolism (HCC)   HTN (hypertension)   Type II diabetes mellitus with renal manifestations (HCC)   HLD (hyperlipidemia)   HIV (human immunodeficiency virus infection) (HCC)   Anemia in chronic kidney disease (CKD)   Protein-calorie malnutrition, severe   Hepatitis C infection  Assessment and Plan: AKI: Cr is trending up daily, in the setting of bladder cancer s/p bilateral hydronephrosis. Continue w/ foley. Previously refused percutaneous nephrostomy tubes but pt's sister, [name], wanted to find out if PCN could be placed so message was sent to IR. Uro following and recs appec.Nephro following and recs apprec   Hypokalemia: likely secondary to above.  Started on veltassa  daily as per nephro. Ca gluconate x1 ordered again   Bladder cancer: not a candidate for treatment. F/u outpatient w/ onco    Hx of DVT/PE: holding eliquis . Continue on heparin  sq  HTN:  continue on home dose of metoprolol     HLD: continue on home dose of statin   DM2: likely poorly controlled. Continue on SSI w/ accuchecks    HIV: continue on home dose of juluca    HCV: continue w/ supportive care      DVT prophylaxis: heparin   Code Status:  full  Family Communication: discussed pt's care w/ pt's care w/ pt's sister, [name], and answered her questions  Disposition Plan: unclear  Level of care: Telemetry Medical  Status is: Inpatient Remains inpatient appropriate because: severity of illness    Consultants:  Uro Nephro Palliative care  Procedures:   Antimicrobials:   Subjective: Pt is still oriented to self only    Objective: Vitals:   10/09/23 1540 10/09/23 1956 10/09/23 2328 10/10/23 0415  BP: 136/73 138/73 133/68 (!) 155/82  Pulse: 65 62 68 77  Resp: 16 20 16 20   Temp: 98 F (36.7 C) 98.4 F (36.9 C) 97.9 F (36.6 C) 99.1 F (37.3 C)  TempSrc:      SpO2: 100% 100% 100% 100%  Weight:      Height:        Intake/Output Summary (Last 24 hours) at 10/10/2023 0752 Last data filed at 10/09/2023 2150 Gross per 24 hour  Intake 240 ml  Output --  Net 240 ml   Filed Weights   10/08/23 1342  Weight: 40.1 kg    Examination:  General exam:appears calm but confused  Respiratory system: diminished breath sounds b/l  Cardiovascular system: S1/S2+. No rubs or clicks Gastrointestinal system: Abd is soft, NT, ND & hypoactive bowel sounds Central nervous system: awake & oriented to self only  Psychiatry: Judgement and insight appears poor. Flat mood and affect    Data Reviewed: I have personally reviewed following labs and imaging studies  CBC: Recent Labs  Lab 10/08/23 1136 10/08/23 1400 10/09/23 0432  WBC 11.1* 11.1* 10.1  NEUTROABS 8.6*  --   --   HGB 8.2* 8.1* 7.2*  HCT 27.1* 27.4* 22.7*  MCV 89.7 90.1 86.3  PLT 266 251 265   Basic Metabolic Panel: Recent Labs  Lab 10/08/23 1136 10/08/23 1400 10/09/23 0432 10/09/23 1713  NA 132* 135 136  --   K 5.7* 5.3*  6.3* 6.2*  CL 99 100 98  --   CO2 19* 19* 18*  --   GLUCOSE 117* 115* 125*  --   BUN 99* 97* 98*  --   CREATININE 13.90* 13.76* 13.87*  --   CALCIUM  9.0 8.9 8.6*  --    GFR: Estimated Creatinine Clearance: 3.1 mL/min (A) (by C-G formula based on SCr of 13.87 mg/dL (H)). Liver Function Tests: Recent Labs  Lab 10/08/23 1136 10/08/23 1400 10/09/23 0432  AST 12* 15 16  ALT 7 7 7   ALKPHOS 86 84 72  BILITOT 0.4 0.5 0.4  PROT 7.1 6.9 6.1*  ALBUMIN  2.3* 2.2* 1.9*   No results for input(s): LIPASE, AMYLASE in the last 168 hours. No results for input(s): AMMONIA in the last 168 hours. Coagulation  Profile: Recent Labs  Lab 10/09/23 0432  INR 1.4*   Cardiac Enzymes: No results for input(s): CKTOTAL, CKMB, CKMBINDEX, TROPONINI in the last 168 hours. BNP (last 3 results) No results for input(s): PROBNP in the last 8760 hours. HbA1C: Recent Labs    10/08/23 1400  HGBA1C 6.1*   CBG: Recent Labs  Lab 10/09/23 0730 10/09/23 1134 10/09/23 1542 10/09/23 1958 10/10/23 0751  GLUCAP 102* 110* 119* 97 123*   Lipid Profile: No results for input(s): CHOL, HDL, LDLCALC, TRIG, CHOLHDL, LDLDIRECT in the last 72 hours. Thyroid Function Tests: No results for input(s): TSH, T4TOTAL, FREET4, T3FREE, THYROIDAB in the last 72 hours. Anemia Panel: Recent Labs    10/08/23 1136  FERRITIN 540*  TIBC 144*  IRON  27*  RETICCTPCT 0.5   Sepsis Labs: No results for input(s): PROCALCITON, LATICACIDVEN in the last 168 hours.  No results found for this or any previous visit (from the past 240 hours).       Radiology Studies: CT Renal Stone Study Result Date: 10/08/2023 CLINICAL DATA:  Abdominal and flank pain EXAM: CT ABDOMEN AND PELVIS WITHOUT CONTRAST TECHNIQUE: Multidetector CT imaging of the abdomen and pelvis was performed following the standard protocol without IV contrast. RADIATION DOSE REDUCTION: This exam was performed according to the departmental dose-optimization program which includes automated exposure control, adjustment of the mA and/or kV according to patient size and/or use of iterative reconstruction technique. COMPARISON:  CT abdomen and pelvis 09/16/2023. FINDINGS: Lower chest: There is scarring or atelectasis in the right lung base. Hepatobiliary: Gallstones are present. There is no biliary ductal dilatation. The liver is within normal limits. Pancreas: There are dense calcifications throughout the pancreas compatible with chronic pancreatitis similar to the prior study. Cystic change in the region of the pancreatic head/duodenal measures  2.5 cm and has slightly increased in size. No acute inflammatory change. Spleen: Normal in size without focal abnormality. Adrenals/Urinary Tract: The bilateral adrenal glands are within normal limits. There is moderate bilateral hydroureteronephrosis to the level of the bladder without obstructing calculus, similar to the prior study. The bladder is distended with superior bladder diverticulum containing air-fluid levels similar to the prior study. The bladder is filled with heterogeneous hyperdense material and air. The bladder wall appears irregular and thickened, particularly inferiorly and anteriorly. There also a few punctate calcifications in the bladder. Foley catheter is present. Stomach/Bowel: Stomach is within normal limits. No evidence of bowel wall thickening, distention, or inflammatory changes. The appendix is not visualized. Scattered colonic diverticula are present. Vascular/Lymphatic: Aortic atherosclerosis. No enlarged abdominal or pelvic lymph nodes. Reproductive: Prostate gland is mildly enlarged. Other: There is no ascites or focal abdominal wall hernia. Musculoskeletal: No acute or significant  osseous findings. IMPRESSION: 1. Moderate bilateral hydroureteronephrosis to the level of the bladder without obstructing calculus. The bladder is distended with heterogeneous hyperdense material and air. The bladder wall appears irregular and thickened, particularly inferiorly and anteriorly. Findings are concerning for bladder neoplasm. 2. Cholelithiasis. 3. Chronic pancreatitis. 4. Cystic change in the region of the pancreatic head/duodenum has slightly increased in size. This can be further evaluated with MRI. 5. Colonic diverticulosis. 6. Aortic atherosclerosis. Aortic Atherosclerosis (ICD10-I70.0). Electronically Signed   By: Greig Pique M.D.   On: 10/08/2023 17:30        Scheduled Meds:  atorvastatin   20 mg Oral QHS   Chlorhexidine  Gluconate Cloth  6 each Topical Daily    dolutegravir -rilpivirine   1 tablet Oral Daily   finasteride   5 mg Oral Daily   folic acid   1 mg Oral Daily   heparin   5,000 Units Subcutaneous Q8H   insulin  aspart  0-6 Units Subcutaneous TID WC   metoprolol  succinate  100 mg Oral Daily   mirtazapine   7.5 mg Oral QHS   patiromer   25.2 g Oral Daily   sodium bicarbonate   650 mg Oral BID   sucralfate   1 g Oral BID   tamsulosin   0.4 mg Oral QPC supper   Continuous Infusions:   LOS: 2 days      Anthony CHRISTELLA Pouch, MD [facility] Pager [phone]  If 7PM-7AM, please contact night-coverage [email or web address]  10/10/2023, 7:52 AM

## 2023-10-10 NOTE — Plan of Care (Signed)
 After speaking with the patient's sister [name] today about decisions on how to proceed with care, I received a call from Lennette Dade, owner of the group home that the patient resides.  She states that she is the legal HCPOA and offered to email a copy of the documentation as none was found in the Epic system.  Per review of previous notes, the following was found from palliative note dated 09/20/2023:  Called and spoke with sister-[name] Ewing, she has been getting updates from the nursing staff regarding Mr. Labrie's condition. Inquired about HCPOA-[name] shares, Lennette is the owner of the group home Mr. Spicher resides in. [name] was unaware of completion of HCPOA and mentions Mr. Seier likely not having knowledge of the document he was providing consent for. It is also documented from medical providers around the time document was completed that Mr. Seifer lacked ability to sign his own consent due to disorientation, known history of HIV dementia for several years.   Concern related to validity of HCPOA as well as possible financial gain/exploitation passed along to [facility] and recommended APS involvement.  It appears the [facility] documentation previously noted and Vynca/ACP tab had been invalidated.  Will need to redetermine validity of HCPOA for decision making in the current admission.  At last admission it appears that decisions were made with both sisters along with purported HCPOA together as a group which made decisions valid even if HCPOA is invalid as both sisters were involved and in agreement as well.  Palliative medicine will continue to follow along.  Thank you for allowing us  to participate in the care of Aida DELENA Cosme Camellia Marvis, NP Palliative Medicine Team

## 2023-10-11 DIAGNOSIS — Z515 Encounter for palliative care: Secondary | ICD-10-CM | POA: Diagnosis not present

## 2023-10-11 DIAGNOSIS — N179 Acute kidney failure, unspecified: Secondary | ICD-10-CM | POA: Diagnosis not present

## 2023-10-11 DIAGNOSIS — N1330 Unspecified hydronephrosis: Secondary | ICD-10-CM | POA: Diagnosis not present

## 2023-10-11 DIAGNOSIS — C679 Malignant neoplasm of bladder, unspecified: Secondary | ICD-10-CM | POA: Diagnosis not present

## 2023-10-11 LAB — URINE CULTURE: Culture: >=100000 — AB

## 2023-10-11 LAB — CBC
HCT: 23.8 % — ABNORMAL LOW (ref 39.0–52.0)
Hemoglobin: 7.5 g/dL — ABNORMAL LOW (ref 13.0–17.0)
MCH: 27.2 pg (ref 26.0–34.0)
MCHC: 31.5 g/dL (ref 30.0–36.0)
MCV: 86.2 fL (ref 80.0–100.0)
Platelets: 236 K/uL (ref 150–400)
RBC: 2.76 MIL/uL — ABNORMAL LOW (ref 4.22–5.81)
RDW: 15.6 % — ABNORMAL HIGH (ref 11.5–15.5)
WBC: 10.3 K/uL (ref 4.0–10.5)
nRBC: 0 % (ref 0.0–0.2)

## 2023-10-11 LAB — BASIC METABOLIC PANEL WITH GFR
Anion gap: 15 (ref 5–15)
BUN: 105 mg/dL — ABNORMAL HIGH (ref 8–23)
CO2: 19 mmol/L — ABNORMAL LOW (ref 22–32)
Calcium: 8.5 mg/dL — ABNORMAL LOW (ref 8.9–10.3)
Chloride: 98 mmol/L (ref 98–111)
Creatinine, Ser: 15.31 mg/dL — ABNORMAL HIGH (ref 0.61–1.24)
GFR, Estimated: 3 mL/min — ABNORMAL LOW (ref >60)
Glucose, Bld: 115 mg/dL — ABNORMAL HIGH (ref 70–99)
Potassium: 5.6 mmol/L — ABNORMAL HIGH (ref 3.5–5.1)
Sodium: 132 mmol/L — ABNORMAL LOW (ref 135–145)

## 2023-10-11 LAB — GLUCOSE, CAPILLARY
Glucose-Capillary: 102 mg/dL — ABNORMAL HIGH (ref 70–99)
Glucose-Capillary: 137 mg/dL — ABNORMAL HIGH (ref 70–99)
Glucose-Capillary: 163 mg/dL — ABNORMAL HIGH (ref 70–99)
Glucose-Capillary: 228 mg/dL — ABNORMAL HIGH (ref 70–99)

## 2023-10-11 MED ORDER — SODIUM ZIRCONIUM CYCLOSILICATE 10 G PO PACK
10.0000 g | PACK | Freq: Two times a day (BID) | ORAL | Status: DC
  Administered 2023-10-11 – 2023-10-12 (×2): 10 g via ORAL
  Filled 2023-10-11 (×2): qty 1

## 2023-10-11 NOTE — Progress Notes (Addendum)
 PROGRESS NOTE    Jay Flores  FMW:[id number] DOB: [birth year] DOA: 10/08/2023 PCP: [name], MD  Assessment & Plan:   Principal Problem:   AKI (acute kidney injury) (HCC) Active Problems:   Acute kidney injury superimposed on stage 3b chronic kidney disease (HCC)   Bilateral hydronephrosis   Bladder mass   DVT (deep venous thrombosis)_left leg   Pulmonary embolism (HCC)   HTN (hypertension)   Type II diabetes mellitus with renal manifestations (HCC)   HLD (hyperlipidemia)   HIV (human immunodeficiency virus infection) (HCC)   Anemia in chronic kidney disease (CKD)   Protein-calorie malnutrition, severe   Hepatitis C infection  Assessment and Plan: AKI on CKDIV: as per nephro. Cr continues trend up daily, in the setting of bladder cancer s/p bilateral hydronephrosis. Continue w/ foley. Pt previously refused percutaneous nephrostomy tubes but pt's sister, [name], wanted to find out if PCN could be placed but after pt's sister talked to IR, she wanted to hold off and think more about the procedure. Highly unlikely that PCN & HD will change poor prognosis. High risk for further decline. Prognosis is poor. Nephro following and recs apprec   Hyperkalemia: likely secondary to above. Continue on veltassa  daily as per nephro   Metabolic acidosis: secondary to   Bladder cancer: not a candidate for treatment. F/u outpatient w/ onco    Hx of DVT/PE: holding eliquis . Continue on heparin    HTN:  continue on metoprolol     HLD: continue on home dose of statin   DM2: likely poorly controlled. Continue on SSI w/ accuchecks    HIV: continue on home dose of juluca    HCV: continue w/ supportive care      DVT prophylaxis: heparin   Code Status:  full  Family Communication: called pt's sister, [name], no answer & unable to leave a voicemail  Disposition Plan: unclear  Level of care: Telemetry Medical  Status is: Inpatient Remains inpatient appropriate because: severity of  illness. High risk for further decline. Poor prognosis     Consultants:  Uro Nephro Palliative care  Procedures:   Antimicrobials:   Subjective: Pt c/o abd pain   Objective: Vitals:   10/10/23 1627 10/10/23 2013 10/11/23 0449 10/11/23 0748  BP: (!) 151/89 139/73 135/77 134/79  Pulse: 71 72 77 77  Resp: 16 16 16 18   Temp: (!) 97.3 F (36.3 C) 98.6 F (37 C) (!) 97.5 F (36.4 C) 98.1 F (36.7 C)  TempSrc:  Oral    SpO2: 100% 100% 100% 100%  Weight:      Height:        Intake/Output Summary (Last 24 hours) at 10/11/2023 0838 Last data filed at 10/11/2023 0500 Gross per 24 hour  Intake 170 ml  Output 100 ml  Net 70 ml   Filed Weights   10/08/23 1342  Weight: 40.1 kg    Examination:  General exam: appears uncomfortable  Respiratory system: decreased breath sounds b/l  Cardiovascular system: S1 & S2+. No rubs or clicks  Gastrointestinal system: abd is soft, NT, ND & hypoactive bowel sounds Central nervous system:awake & oriented to self only  Psychiatry: judgement and insight appears poor. Flat mood and affect    Data Reviewed: I have personally reviewed following labs and imaging studies  CBC: Recent Labs  Lab 10/08/23 1136 10/08/23 1400 10/09/23 0432 10/10/23 0838 10/11/23 0331  WBC 11.1* 11.1* 10.1 10.9* 10.3  NEUTROABS 8.6*  --   --   --   --  HGB 8.2* 8.1* 7.2* 7.5* 7.5*  HCT 27.1* 27.4* 22.7* 24.0* 23.8*  MCV 89.7 90.1 86.3 86.6 86.2  PLT 266 251 265 255 236   Basic Metabolic Panel: Recent Labs  Lab 10/08/23 1136 10/08/23 1400 10/09/23 0432 10/09/23 1713 10/10/23 0838 10/10/23 2201 10/11/23 0331  NA 132* 135 136  --  135  --  132*  K 5.7* 5.3* 6.3* 6.2* 6.0* 5.9* 5.6*  CL 99 100 98  --  99  --  98  CO2 19* 19* 18*  --  19*  --  19*  GLUCOSE 117* 115* 125*  --  124*  --  115*  BUN 99* 97* 98*  --  101*  --  105*  CREATININE 13.90* 13.76* 13.87*  --  14.23*  --  15.31*  CALCIUM  9.0 8.9 8.6*  --  8.5*  --  8.5*   GFR: Estimated  Creatinine Clearance: 2.8 mL/min (A) (by C-G formula based on SCr of 15.31 mg/dL (H)). Liver Function Tests: Recent Labs  Lab 10/08/23 1136 10/08/23 1400 10/09/23 0432 10/10/23 0838  AST 12* 15 16 13*  ALT 7 7 7 8   ALKPHOS 86 84 72 70  BILITOT 0.4 0.5 0.4 0.5  PROT 7.1 6.9 6.1* 6.0*  ALBUMIN  2.3* 2.2* 1.9* 1.9*   No results for input(s): LIPASE, AMYLASE in the last 168 hours. No results for input(s): AMMONIA in the last 168 hours. Coagulation Profile: Recent Labs  Lab 10/09/23 0432  INR 1.4*   Cardiac Enzymes: No results for input(s): CKTOTAL, CKMB, CKMBINDEX, TROPONINI in the last 168 hours. BNP (last 3 results) No results for input(s): PROBNP in the last 8760 hours. HbA1C: Recent Labs    10/08/23 1400  HGBA1C 6.1*   CBG: Recent Labs  Lab 10/10/23 0751 10/10/23 1155 10/10/23 1726 10/10/23 2039 10/11/23 0746  GLUCAP 123* 151* 109* 156* 102*   Lipid Profile: No results for input(s): CHOL, HDL, LDLCALC, TRIG, CHOLHDL, LDLDIRECT in the last 72 hours. Thyroid Function Tests: No results for input(s): TSH, T4TOTAL, FREET4, T3FREE, THYROIDAB in the last 72 hours. Anemia Panel: Recent Labs    10/08/23 1136  FERRITIN 540*  TIBC 144*  IRON  27*  RETICCTPCT 0.5   Sepsis Labs: No results for input(s): PROCALCITON, LATICACIDVEN in the last 168 hours.  Recent Results (from the past 240 hours)  Urine Culture     Status: Abnormal   Collection Time: 10/08/23  4:48 PM   Specimen: Urine, Random  Result Value Ref Range Status   Specimen Description   Final    URINE, RANDOM Performed at [facility], [address], [place], [place] [address]    Special Requests   Final    NONE Reflexed from [id number] Performed at [facility], [address] [place], [place] [address]    Culture >=100,000 COLONIES/mL ENTEROCOCCUS FAECALIS (A)  Final   Report Status 10/11/2023 FINAL  Final   Organism ID, Bacteria  ENTEROCOCCUS FAECALIS (A)  Final      Susceptibility   Enterococcus faecalis - MIC*    AMPICILLIN  <=2 SENSITIVE Sensitive     NITROFURANTOIN <=16 SENSITIVE Sensitive     VANCOMYCIN 1 SENSITIVE Sensitive     * >=100,000 COLONIES/mL ENTEROCOCCUS FAECALIS         Radiology Studies: No results found.       Scheduled Meds:  atorvastatin   20 mg Oral QHS   Chlorhexidine  Gluconate Cloth  6 each Topical Daily   dolutegravir -rilpivirine   1 tablet Oral Daily  finasteride   5 mg Oral Daily   folic acid   1 mg Oral Daily   heparin   5,000 Units Subcutaneous Q8H   insulin  aspart  0-6 Units Subcutaneous TID WC   metoprolol  succinate  100 mg Oral Daily   mirtazapine   7.5 mg Oral QHS   patiromer   25.2 g Oral Daily   sodium bicarbonate   650 mg Oral BID   sodium zirconium cyclosilicate   10 g Oral Daily   sucralfate   1 g Oral BID   tamsulosin   0.4 mg Oral QPC supper   Continuous Infusions:   LOS: 3 days      Anthony CHRISTELLA Pouch, MD [facility] Pager [phone]  If 7PM-7AM, please contact night-coverage [email or web address]  10/11/2023, 8:38 AM

## 2023-10-11 NOTE — Progress Notes (Signed)
   10/11/23 1400  Spiritual Encounters  Type of Visit Initial  Care provided to: Patient  Conversation partners present during encounter Nurse  Reason for visit Routine spiritual support  OnCall Visit No   Chaplain visited patient per staff referral.  Patient introduced herself and Dispensing optician and asked patient if he'd liked spiritual care.  Patient was resting but was still welcoming to Chaplain.  Patient said he had no needs at this time.   Rev. Rana M. Nicholaus, M.Div. Chaplain Resident [facility]

## 2023-10-11 NOTE — Progress Notes (Addendum)
 [facility]  ROUNDING NOTE   Subjective:   Patient sitting up in bed Awake and alert Eating breakfast Denies pain Room air  Creatinine 15.31 Potassium 5.6  Objective:  Vital signs in last 24 hours:  Temp:  [97.3 F (36.3 C)-98.6 F (37 C)] 98.1 F (36.7 C) (09/05 0748) Pulse Rate:  [71-77] 77 (09/05 0748) Resp:  [16-18] 18 (09/05 0748) BP: (134-151)/(69-89) 134/79 (09/05 0748) SpO2:  [100 %] 100 % (09/05 0748)  Weight change:  Filed Weights   10/08/23 1342  Weight: 40.1 kg    Intake/Output: I/O last 3 completed shifts: In: 410 [P.O.:410] Out: 100 [Urine:100]   Intake/Output this shift:  No intake/output data recorded.  Physical Exam: General: NAD, ill appearing  Head: Normocephalic, atraumatic. Moist oral mucosal membranes  Eyes: Anicteric  Lungs:  Clear to auscultation, room air  Heart: Regular rate and rhythm  Abdomen:  Soft, nontender  Extremities:  No peripheral edema.  Neurologic: Awake, alert, conversant  Skin: Warm,dry, no rash  Access: None    Basic Metabolic Panel: Recent Labs  Lab 10/08/23 1136 10/08/23 1400 10/09/23 0432 10/09/23 1713 10/10/23 0838 10/10/23 2201 10/11/23 0331  NA 132* 135 136  --  135  --  132*  K 5.7* 5.3* 6.3* 6.2* 6.0* 5.9* 5.6*  CL 99 100 98  --  99  --  98  CO2 19* 19* 18*  --  19*  --  19*  GLUCOSE 117* 115* 125*  --  124*  --  115*  BUN 99* 97* 98*  --  101*  --  105*  CREATININE 13.90* 13.76* 13.87*  --  14.23*  --  15.31*  CALCIUM  9.0 8.9 8.6*  --  8.5*  --  8.5*    Liver Function Tests: Recent Labs  Lab 10/08/23 1136 10/08/23 1400 10/09/23 0432 10/10/23 0838  AST 12* 15 16 13*  ALT 7 7 7 8   ALKPHOS 86 84 72 70  BILITOT 0.4 0.5 0.4 0.5  PROT 7.1 6.9 6.1* 6.0*  ALBUMIN  2.3* 2.2* 1.9* 1.9*   No results for input(s): LIPASE, AMYLASE in the last 168 hours. No results for input(s): AMMONIA in the last 168 hours.  CBC: Recent Labs  Lab 10/08/23 1136 10/08/23 1400  10/09/23 0432 10/10/23 0838 10/11/23 0331  WBC 11.1* 11.1* 10.1 10.9* 10.3  NEUTROABS 8.6*  --   --   --   --   HGB 8.2* 8.1* 7.2* 7.5* 7.5*  HCT 27.1* 27.4* 22.7* 24.0* 23.8*  MCV 89.7 90.1 86.3 86.6 86.2  PLT 266 251 265 255 236    Cardiac Enzymes: No results for input(s): CKTOTAL, CKMB, CKMBINDEX, TROPONINI in the last 168 hours.  BNP: Invalid input(s): POCBNP  CBG: Recent Labs  Lab 10/10/23 0751 10/10/23 1155 10/10/23 1726 10/10/23 2039 10/11/23 0746  GLUCAP 123* 151* 109* 156* 102*    Microbiology: Results for orders placed or performed during the hospital encounter of 10/08/23  Urine Culture     Status: Abnormal   Collection Time: 10/08/23  4:48 PM   Specimen: Urine, Random  Result Value Ref Range Status   Specimen Description   Final    URINE, RANDOM Performed at [facility], [address], [place], [place] [address]    Special Requests   Final    NONE Reflexed from [id number] Performed at [facility], [address] [place], [place] [address]    Culture >=100,000 COLONIES/mL ENTEROCOCCUS FAECALIS (A)  Final   Report Status  10/11/2023 FINAL  Final   Organism ID, Bacteria ENTEROCOCCUS FAECALIS (A)  Final      Susceptibility   Enterococcus faecalis - MIC*    AMPICILLIN  <=2 SENSITIVE Sensitive     NITROFURANTOIN <=16 SENSITIVE Sensitive     VANCOMYCIN 1 SENSITIVE Sensitive     * >=100,000 COLONIES/mL ENTEROCOCCUS FAECALIS    Coagulation Studies: Recent Labs    10/09/23 0432  LABPROT 18.2*  INR 1.4*    Urinalysis: Recent Labs    10/08/23 1648  COLORURINE YELLOW*  LABSPEC 1.024  PHURINE 6.0  GLUCOSEU NEGATIVE  HGBUR SMALL*  BILIRUBINUR NEGATIVE  KETONESUR NEGATIVE  PROTEINUR 30*  NITRITE NEGATIVE  LEUKOCYTESUR SMALL*      Imaging: No results found.    Medications:     atorvastatin   20 mg Oral QHS   Chlorhexidine  Gluconate Cloth  6 each Topical Daily   dolutegravir -rilpivirine   1 tablet Oral Daily    finasteride   5 mg Oral Daily   folic acid   1 mg Oral Daily   heparin   5,000 Units Subcutaneous Q8H   insulin  aspart  0-6 Units Subcutaneous TID WC   metoprolol  succinate  100 mg Oral Daily   mirtazapine   7.5 mg Oral QHS   patiromer   25.2 g Oral Daily   sodium bicarbonate   650 mg Oral BID   sodium zirconium cyclosilicate   10 g Oral Daily   sucralfate   1 g Oral BID   tamsulosin   0.4 mg Oral QPC supper   acetaminophen  **OR** acetaminophen , loperamide , morphine  injection, ondansetron  **OR** ondansetron  (ZOFRAN ) IV, oxyCODONE , polyethylene glycol  Assessment/ Plan:  Mr. Jay Flores is a 63 y.o.  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  Acute kidney injury on chronic kidney disease stage IV baseline eGFR 23. 2.  Hyperkalemia serum potassium 6.3. 3.  Acute metabolic acidosis with serum bicarbonate of 18. 4.  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   Lab Results  Component Value Date   CREATININE 15.31 (H) 10/11/2023   CREATININE 14.23 (H) 10/10/2023   CREATININE 13.87 (H) 10/09/2023    Intake/Output Summary (Last 24 hours) at 10/11/2023 0932 Last data filed at 10/11/2023 0500 Gross per 24 hour  Intake 170 ml  Output 100 ml  Net 70 ml   Plan: We were consulted for evaluation management of acute kidney injury in the setting of known chronic kidney disease stage IV.  In late August his baseline creatinine ranged between 2.5-2.7 with an eGFR of 23.  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  He was not  felt to be a surgical candidate here at our institution.  They did mention that bilateral nephrostomies could be considered.  Suspect that obstruction is playing a role in his acute kidney injury now.    Update: Creatinine continues to increase, 15.31. IR plans to place bilateral nephrostomy tubes on 9/8 Potassium 5.6. Continue daily Veltassa  25.2g. Will increase Lokelma  to twice daily.  Will order low potassium diet as patient has banana and potatoes on breakfast tray. Sodium bicarb remains 19. Continue oral supplementation.  If he  is not a candidate for surgical therapy then it would not make sense to pursue renal placement therapy in the form of hemodialysis.   Prognosis remains poor and we would recommend further goals of care discussion that favor comfort measures.     LOS: 3 Ricahrd Schwager 9/5/20259:32 AM

## 2023-10-11 NOTE — Plan of Care (Signed)

## 2023-10-11 NOTE — Progress Notes (Signed)
 Daily Progress Note   Patient Name: Jay Flores       Date: 10/11/2023 DOB: 11/04/1960  Age: 63 y.o. MRN#: [id number] Attending Physician: Trudy Anthony HERO, MD Primary Care Physician: [name], MD Admit Date: 10/08/2023  Reason for Consultation/Follow-up: Establishing goals of care  HPI/Brief Hospital Review: 63 y.o. male with past medical history of HTN, HLD, T2DM, COPD, CKD stage 3B, HIV with demenita, HCV, thrombocytopenia, recent history of prostate cancer-completed radiation treatment (07/2023), chronic indewelling Foley catheter, DVT/PE on Eliquis , iron  deficiency anemia and recent diagnosis of urothelial carcinoma/squamous cell carcinoma of bladder during recent admission 8/11-8/23 admitted from Group Home on 10/08/2023 with critical lab value of creatinine 13.9 from labs ordered by oncology.  Admitted and being treated for significant AKI superimposed on CKD, bilateral hydronephrosis secondary to muscle-invasive squamous cell carcinoma  Creatinine 15.31 today, continues to rise each day since admission--nephrology on board, not recommended to initiate RRT as treatment/interventions for underlying bladder cancer not being offered at this time  Urology on board--considering placement of bilateral nephrostomy tubes, unsure of benefit from nephrostomy tube at this point  Palliative Medicine consulted for assisting with goals of care conversations.  Subjective: Extensive chart review has been completed prior to meeting patient including labs, vital signs, imaging, progress notes, orders, and available advanced directive documents from current and previous encounters.    Visited with [name] at his bedside, he is resting in bed but responds to my presence in room. He is able  to state his name and location, unaware of events that led to hospitalization. No family or visitors at bedside during time of visit.  Called and spoke with Lennette Ray-owner of group home, she is aware of [name] current condition and discussions regarding nephrostomy tube placement. Lennette shares she has been in contact with [name]'s sisters and they are all in agreement to proceed with tube placement. We discussed risks as well as possibility of nephrostomy tubes not having much benefit at this point. We discussed overall poor long term prognosis. Lennette shares her understanding and feels [name]'s sisters have the same understanding. They are hopeful for nephrostomy tube placement and to continue with outpatient follow up with oncology.  Answered and addressed all questions and concerns. PMT to continue to follow for ongoing needs and support.  Objective:  Physical Exam Constitutional:      General: He is not in acute distress.    Appearance: He is cachectic. He is ill-appearing.  Pulmonary:     Effort: Pulmonary effort is normal. No respiratory distress.  Skin:    General: Skin is warm and dry.  Neurological:     Motor: Weakness present.     Comments: Oriented to person and place             Vital Signs: BP 125/69 (BP Location: Left Arm)   Pulse 80   Temp 98.6 F (37 C)   Resp 16   Ht 5' 8 (1.727 m)   Wt 40.1 kg   SpO2 100%   BMI 13.43 kg/m  SpO2: SpO2: 100 % O2 Device: O2 Device: Room Air O2 Flow Rate:     Palliative Care Assessment & Plan   Assessment/Recommendation/Plan  Continue with plan of care PMT to continue to follow for ongoing needs and support  Thank you for allowing the Palliative Medicine Team to assist in the care of this patient.  Total time:  35 minutes  Time spent includes: Detailed review of medical records (labs, imaging, vital signs), medically appropriate exam (mental status, respiratory, cardiac, skin), discussed with  treatment team, counseling and educating patient, family and staff, documenting clinical information, medication management and coordination of care.  Waddell Lesches, DNP, AGNP-C Palliative Medicine   Please contact Palliative Medicine Team phone at [phone] for questions and concerns.

## 2023-10-11 NOTE — TOC Progression Note (Signed)
 Transition of Care [facility]) - Progression Note    Patient Details  Name: Jay Flores MRN: [id number] Date of Birth: 1960/10/02  Transition of Care [facility]) CM/SW Contact  [name], LCSW Phone Number: 10/11/2023, 10:34 AM  Clinical Narrative:   [name] Rummer spk with [name] nurse [place]. [name] confirmed [name] will return to group home when ready for discharge. [name] reports if discharge occurs during the weekend the facility is unable to provide transportation due to staffing.     Expected Discharge Plan: Group Home Barriers to Discharge: Continued Medical Work up               Expected Discharge Plan and Services   Discharge Planning Services: CM Consult   Living arrangements for the past 2 months: Group Home                                       Social Drivers of Health (SDOH) Interventions SDOH Screenings   Food Insecurity: No Food Insecurity (10/08/2023)  Housing: Low Risk  (10/08/2023)  Transportation Needs: No Transportation Needs (10/08/2023)  Utilities: Not At Risk (10/08/2023)  Depression (PHQ2-9): Low Risk  (03/23/2022)  Tobacco Use: High Risk (10/08/2023)    Readmission Risk Interventions    07/31/2023    9:41 AM  Readmission Risk Prevention Plan  Transportation Screening Complete  Medication Review (RN Care Manager) Complete  PCP or Specialist appointment within 3-5 days of discharge Complete  SW Recovery Care/Counseling Consult Complete  Palliative Care Screening Not Applicable  Skilled Nursing Facility Not Applicable

## 2023-10-11 NOTE — Plan of Care (Signed)
  Problem: Education: Goal: Ability to describe self-care measures that may prevent or decrease complications (Diabetes Survival Skills Education) will improve Outcome: Progressing   Problem: Coping: Goal: Ability to adjust to condition or change in health will improve Outcome: Progressing   Problem: Fluid Volume: Goal: Ability to maintain a balanced intake and output will improve Outcome: Progressing   Problem: Health Behavior/Discharge Planning: Goal: Ability to identify and utilize available resources and services will improve Outcome: Progressing   Problem: Metabolic: Goal: Ability to maintain appropriate glucose levels will improve Outcome: Progressing   Problem: Nutritional: Goal: Maintenance of adequate nutrition will improve Outcome: Progressing   Problem: Skin Integrity: Goal: Risk for impaired skin integrity will decrease Outcome: Progressing   Problem: Tissue Perfusion: Goal: Adequacy of tissue perfusion will improve Outcome: Progressing   

## 2023-10-12 DIAGNOSIS — N179 Acute kidney failure, unspecified: Secondary | ICD-10-CM | POA: Diagnosis not present

## 2023-10-12 DIAGNOSIS — C679 Malignant neoplasm of bladder, unspecified: Secondary | ICD-10-CM | POA: Diagnosis not present

## 2023-10-12 DIAGNOSIS — Z515 Encounter for palliative care: Secondary | ICD-10-CM | POA: Diagnosis not present

## 2023-10-12 DIAGNOSIS — N1330 Unspecified hydronephrosis: Secondary | ICD-10-CM | POA: Diagnosis not present

## 2023-10-12 LAB — BASIC METABOLIC PANEL WITH GFR
Anion gap: 16 — ABNORMAL HIGH (ref 5–15)
BUN: 109 mg/dL — ABNORMAL HIGH (ref 8–23)
CO2: 18 mmol/L — ABNORMAL LOW (ref 22–32)
Calcium: 8.5 mg/dL — ABNORMAL LOW (ref 8.9–10.3)
Chloride: 98 mmol/L (ref 98–111)
Creatinine, Ser: 16.44 mg/dL — ABNORMAL HIGH (ref 0.61–1.24)
GFR, Estimated: 3 mL/min — ABNORMAL LOW (ref >60)
Glucose, Bld: 133 mg/dL — ABNORMAL HIGH (ref 70–99)
Potassium: 4.7 mmol/L (ref 3.5–5.1)
Sodium: 132 mmol/L — ABNORMAL LOW (ref 135–145)

## 2023-10-12 LAB — GLUCOSE, CAPILLARY
Glucose-Capillary: 140 mg/dL — ABNORMAL HIGH (ref 70–99)
Glucose-Capillary: 149 mg/dL — ABNORMAL HIGH (ref 70–99)
Glucose-Capillary: 152 mg/dL — ABNORMAL HIGH (ref 70–99)
Glucose-Capillary: 163 mg/dL — ABNORMAL HIGH (ref 70–99)

## 2023-10-12 LAB — CBC
HCT: 24 % — ABNORMAL LOW (ref 39.0–52.0)
Hemoglobin: 7.6 g/dL — ABNORMAL LOW (ref 13.0–17.0)
MCH: 27.5 pg (ref 26.0–34.0)
MCHC: 31.7 g/dL (ref 30.0–36.0)
MCV: 87 fL (ref 80.0–100.0)
Platelets: 248 K/uL (ref 150–400)
RBC: 2.76 MIL/uL — ABNORMAL LOW (ref 4.22–5.81)
RDW: 15.5 % (ref 11.5–15.5)
WBC: 10.7 K/uL — ABNORMAL HIGH (ref 4.0–10.5)
nRBC: 0 % (ref 0.0–0.2)

## 2023-10-12 MED ORDER — ENSURE PLUS HIGH PROTEIN PO LIQD
237.0000 mL | Freq: Two times a day (BID) | ORAL | Status: DC
  Administered 2023-10-12 – 2023-10-15 (×5): 237 mL via ORAL

## 2023-10-12 MED ORDER — SODIUM BICARBONATE 650 MG PO TABS
1300.0000 mg | ORAL_TABLET | Freq: Two times a day (BID) | ORAL | Status: DC
  Administered 2023-10-12 – 2023-10-16 (×8): 1300 mg via ORAL
  Filled 2023-10-12 (×8): qty 2

## 2023-10-12 NOTE — Progress Notes (Signed)
 [facility]  ROUNDING NOTE   Subjective:   Patient resting quietly Easily aroused Complains of lower abd pain  Creatinine 16.44 Potassium 4.7  Objective:  Vital signs in last 24 hours:  Temp:  [97.7 F (36.5 C)-98.6 F (37 C)] 97.7 F (36.5 C) (09/06 0818) Pulse Rate:  [80-83] 82 (09/06 0818) Resp:  [16-18] 18 (09/06 0818) BP: (125-142)/(69-79) 132/69 (09/06 0818) SpO2:  [100 %] 100 % (09/06 0818)  Weight change:  Filed Weights   10/08/23 1342  Weight: 40.1 kg    Intake/Output: I/O last 3 completed shifts: In: 410 [P.O.:410] Out: 100 [Urine:100]   Intake/Output this shift:  No intake/output data recorded.  Physical Exam: General: NAD, ill appearing  Head: Normocephalic, atraumatic. Moist oral mucosal membranes  Eyes: Anicteric  Lungs:  Clear to auscultation, room air  Heart: Regular rate and rhythm  Abdomen:  Soft, nontender  Extremities:  No peripheral edema.  Neurologic: Awake, alert, conversant  Skin: Warm,dry, no rash  Access: None    Basic Metabolic Panel: Recent Labs  Lab 10/08/23 1400 10/09/23 0432 10/09/23 1713 10/10/23 0838 10/10/23 2201 10/11/23 0331 10/12/23 0319  NA 135 136  --  135  --  132* 132*  K 5.3* 6.3* 6.2* 6.0* 5.9* 5.6* 4.7  CL 100 98  --  99  --  98 98  CO2 19* 18*  --  19*  --  19* 18*  GLUCOSE 115* 125*  --  124*  --  115* 133*  BUN 97* 98*  --  101*  --  105* 109*  CREATININE 13.76* 13.87*  --  14.23*  --  15.31* 16.44*  CALCIUM  8.9 8.6*  --  8.5*  --  8.5* 8.5*    Liver Function Tests: Recent Labs  Lab 10/08/23 1136 10/08/23 1400 10/09/23 0432 10/10/23 0838  AST 12* 15 16 13*  ALT 7 7 7 8   ALKPHOS 86 84 72 70  BILITOT 0.4 0.5 0.4 0.5  PROT 7.1 6.9 6.1* 6.0*  ALBUMIN  2.3* 2.2* 1.9* 1.9*   No results for input(s): LIPASE, AMYLASE in the last 168 hours. No results for input(s): AMMONIA in the last 168 hours.  CBC: Recent Labs  Lab 10/08/23 1136 10/08/23 1400 10/09/23 0432  10/10/23 0838 10/11/23 0331 10/12/23 0319  WBC 11.1* 11.1* 10.1 10.9* 10.3 10.7*  NEUTROABS 8.6*  --   --   --   --   --   HGB 8.2* 8.1* 7.2* 7.5* 7.5* 7.6*  HCT 27.1* 27.4* 22.7* 24.0* 23.8* 24.0*  MCV 89.7 90.1 86.3 86.6 86.2 87.0  PLT 266 251 265 255 236 248    Cardiac Enzymes: No results for input(s): CKTOTAL, CKMB, CKMBINDEX, TROPONINI in the last 168 hours.  BNP: Invalid input(s): POCBNP  CBG: Recent Labs  Lab 10/11/23 0746 10/11/23 1229 10/11/23 1536 10/11/23 2056 10/12/23 0853  GLUCAP 102* 137* 163* 228* 140*    Microbiology: Results for orders placed or performed during the hospital encounter of 10/08/23  Urine Culture     Status: Abnormal   Collection Time: 10/08/23  4:48 PM   Specimen: Urine, Random  Result Value Ref Range Status   Specimen Description   Final    URINE, RANDOM Performed at [facility], [address], [place], [place] [address]    Special Requests   Final    NONE Reflexed from [id number] Performed at [facility], [address] [place], [place] [address]    Culture >=100,000 COLONIES/mL ENTEROCOCCUS FAECALIS (A)  Final   Report Status 10/11/2023 FINAL  Final   Organism ID, Bacteria ENTEROCOCCUS FAECALIS (A)  Final      Susceptibility   Enterococcus faecalis - MIC*    AMPICILLIN  <=2 SENSITIVE Sensitive     NITROFURANTOIN <=16 SENSITIVE Sensitive     VANCOMYCIN 1 SENSITIVE Sensitive     * >=100,000 COLONIES/mL ENTEROCOCCUS FAECALIS    Coagulation Studies: No results for input(s): LABPROT, INR in the last 72 hours.   Urinalysis: No results for input(s): COLORURINE, LABSPEC, PHURINE, GLUCOSEU, HGBUR, BILIRUBINUR, KETONESUR, PROTEINUR, UROBILINOGEN, NITRITE, LEUKOCYTESUR in the last 72 hours.  Invalid input(s): APPERANCEUR     Imaging: No results found.    Medications:     atorvastatin   20 mg Oral QHS   Chlorhexidine  Gluconate Cloth  6 each Topical Daily    dolutegravir -rilpivirine   1 tablet Oral Daily   feeding supplement  237 mL Oral BID BM   finasteride   5 mg Oral Daily   folic acid   1 mg Oral Daily   heparin   5,000 Units Subcutaneous Q8H   insulin  aspart  0-6 Units Subcutaneous TID WC   metoprolol  succinate  100 mg Oral Daily   mirtazapine   7.5 mg Oral QHS   sodium bicarbonate   650 mg Oral BID   sucralfate   1 g Oral BID   tamsulosin   0.4 mg Oral QPC supper   acetaminophen  **OR** acetaminophen , loperamide , morphine  injection, ondansetron  **OR** ondansetron  (ZOFRAN ) IV, oxyCODONE , polyethylene glycol  Assessment/ Plan:  Jay Flores is a 63 y.o.  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  Acute kidney injury on chronic kidney disease stage IV baseline eGFR 23. 2.  Hyperkalemia serum potassium 6.3. 3.  Acute metabolic acidosis with serum bicarbonate of 18. 4.  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   Lab Results  Component Value Date   CREATININE 16.44 (H) 10/12/2023   CREATININE 15.31 (H) 10/11/2023   CREATININE 14.23 (H) 10/10/2023    Intake/Output Summary (Last 24 hours) at 10/12/2023 0935 Last data filed at 10/11/2023 1300 Gross per 24 hour  Intake 240 ml  Output --  Net 240 ml   Plan: We were consulted for evaluation management of acute kidney injury in the setting of known chronic kidney disease stage IV.  In late August his baseline creatinine ranged between 2.5-2.7 with an eGFR of 23.  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  He was not  felt to be a surgical candidate here at our institution.  They did mention that bilateral nephrostomies could be considered.  Suspect that obstruction is playing a role in his acute kidney injury now.    Update: Creatinine 16.44 IR plans to place bilateral nephrostomy tubes on 9/8 Potassium 4.7, will stop lokelma  and veltassa  and monitor Continue low potassium diet  Sodium bicarb 18. Will increase sodium bicarbonate  to 1300mg  twice daily If he is not a candidate for surgical therapy then it would not make sense to pursue renal placement therapy in the form of hemodialysis.   Prognosis remains poor and we would recommend further goals  of care discussion that favor comfort measures.     LOS: 4 Torunn Chancellor 9/6/20259:35 AM

## 2023-10-12 NOTE — Plan of Care (Signed)

## 2023-10-12 NOTE — Progress Notes (Signed)
 Patient had a BM  this morning , patient was cleaned  and  CHG  care was provided to the foley .

## 2023-10-12 NOTE — Progress Notes (Signed)
 PROGRESS NOTE    Jay Flores  FMW:[id number] DOB: Jul 11, 1960 DOA: 10/08/2023 PCP: [name], MD  Assessment & Plan:   Principal Problem:   AKI (acute kidney injury) (HCC) Active Problems:   Acute kidney injury superimposed on stage 3b chronic kidney disease (HCC)   Bilateral hydronephrosis   Bladder mass   DVT (deep venous thrombosis)_left leg   Pulmonary embolism (HCC)   HTN (hypertension)   Type II diabetes mellitus with renal manifestations (HCC)   HLD (hyperlipidemia)   HIV (human immunodeficiency virus infection) (HCC)   Anemia in chronic kidney disease (CKD)   Protein-calorie malnutrition, severe   Hepatitis C infection  Assessment and Plan: AKI on CKDIV: as per nephro. Cr continues to trend up daily, in the setting of bladder cancer w/ bilateral hydronephrosis. Continue w/ foley.  Pt previously refused percutaneous nephrostomy tubes but pt's sister, Jay Flores, wanted to find out if PCN could be placed but after pt's sister talked to IR, she wanted to hold off and think more about the procedure. Highly unlikely that PCN & HD will change very poor prognosis. High risk for further decline. Very poor prognosis. This was discussed w/ pt's sister, Jay Flores, who verbalized her understanding. Nephro following and recs apprec   Hyperkalemia: likely secondary to above. WNL today   Metabolic acidosis: secondary to AKI on CKDIV. Continue on sodium bicarb as per neprho   Bladder cancer: not a candidate for treatment. F/u outpatient w/ onco    Hx of DVT/PE: holding eliquis . Continue on heparin    HTN:  continue on metoprolol     HLD: continue on home dose of statin   DM2: likely poorly controlled. Continue on SSI w/ accuchecks    HIV: continue on home dose of juluca    HCV: continue w/ supportive care      DVT prophylaxis: heparin   Code Status:  full  Family Communication: discussed pt's poor prognosis w/ pt's sister, Jay Flores, and encourage her to come see the pt today  and she verbalized her understanting  Disposition Plan: unclear  Level of care: Telemetry Medical  Status is: Inpatient Remains inpatient appropriate because: severity of illness. High risk for further decline. Very poor prognosis     Consultants:  Uro Nephro Palliative care  Procedures:   Antimicrobials:   Subjective: Pt is oriented to self only   Objective: Vitals:   10/11/23 1534 10/11/23 1924 10/12/23 0347 10/12/23 0818  BP: 125/69 137/78 (!) 142/79 132/69  Pulse: 80 80 83 82  Resp: 16 18 18 18   Temp: 98.6 F (37 C) 98.5 F (36.9 C) 97.9 F (36.6 C) 97.7 F (36.5 C)  TempSrc:      SpO2: 100% 100% 100% 100%  Weight:      Height:        Intake/Output Summary (Last 24 hours) at 10/12/2023 0837 Last data filed at 10/11/2023 1300 Gross per 24 hour  Intake 360 ml  Output --  Net 360 ml   Filed Weights   10/08/23 1342  Weight: 40.1 kg    Examination:  General exam: appears lethargic  Respiratory system: diminished breath sounds b/l  Cardiovascular system: S1/S2+. No rubs or clicks  Gastrointestinal system: abd is soft, NT, ND & hypoactive bowel sounds  Central nervous system: lethargic Psychiatry: judgement and insight appears poor. Flat mood and affect    Data Reviewed: I have personally reviewed following labs and imaging studies  CBC: Recent Labs  Lab 10/08/23 1136 10/08/23 1400 10/09/23 0432 10/10/23 9161 10/11/23 0331  10/12/23 0319  WBC 11.1* 11.1* 10.1 10.9* 10.3 10.7*  NEUTROABS 8.6*  --   --   --   --   --   HGB 8.2* 8.1* 7.2* 7.5* 7.5* 7.6*  HCT 27.1* 27.4* 22.7* 24.0* 23.8* 24.0*  MCV 89.7 90.1 86.3 86.6 86.2 87.0  PLT 266 251 265 255 236 248   Basic Metabolic Panel: Recent Labs  Lab 10/08/23 1400 10/09/23 0432 10/09/23 1713 10/10/23 0838 10/10/23 2201 10/11/23 0331 10/12/23 0319  NA 135 136  --  135  --  132* 132*  K 5.3* 6.3* 6.2* 6.0* 5.9* 5.6* 4.7  CL 100 98  --  99  --  98 98  CO2 19* 18*  --  19*  --  19* 18*   GLUCOSE 115* 125*  --  124*  --  115* 133*  BUN 97* 98*  --  101*  --  105* 109*  CREATININE 13.76* 13.87*  --  14.23*  --  15.31* 16.44*  CALCIUM  8.9 8.6*  --  8.5*  --  8.5* 8.5*   GFR: Estimated Creatinine Clearance: 2.6 mL/min (A) (by C-G formula based on SCr of 16.44 mg/dL (H)). Liver Function Tests: Recent Labs  Lab 10/08/23 1136 10/08/23 1400 10/09/23 0432 10/10/23 0838  AST 12* 15 16 13*  ALT 7 7 7 8   ALKPHOS 86 84 72 70  BILITOT 0.4 0.5 0.4 0.5  PROT 7.1 6.9 6.1* 6.0*  ALBUMIN  2.3* 2.2* 1.9* 1.9*   No results for input(s): LIPASE, AMYLASE in the last 168 hours. No results for input(s): AMMONIA in the last 168 hours. Coagulation Profile: Recent Labs  Lab 10/09/23 0432  INR 1.4*   Cardiac Enzymes: No results for input(s): CKTOTAL, CKMB, CKMBINDEX, TROPONINI in the last 168 hours. BNP (last 3 results) No results for input(s): PROBNP in the last 8760 hours. HbA1C: No results for input(s): HGBA1C in the last 72 hours.  CBG: Recent Labs  Lab 10/10/23 2039 10/11/23 0746 10/11/23 1229 10/11/23 1536 10/11/23 2056  GLUCAP 156* 102* 137* 163* 228*   Lipid Profile: No results for input(s): CHOL, HDL, LDLCALC, TRIG, CHOLHDL, LDLDIRECT in the last 72 hours. Thyroid Function Tests: No results for input(s): TSH, T4TOTAL, FREET4, T3FREE, THYROIDAB in the last 72 hours. Anemia Panel: No results for input(s): VITAMINB12, FOLATE, FERRITIN, TIBC, IRON , RETICCTPCT in the last 72 hours.  Sepsis Labs: No results for input(s): PROCALCITON, LATICACIDVEN in the last 168 hours.  Recent Results (from the past 240 hours)  Urine Culture     Status: Abnormal   Collection Time: 10/08/23  4:48 PM   Specimen: Urine, Random  Result Value Ref Range Status   Specimen Description   Final    URINE, RANDOM Performed at [facility], [address] [place], [place] [address]    Special Requests   Final    NONE  Reflexed from [phone] Performed at [facility], [address] [place], [place] [address]    Culture >=100,000 COLONIES/mL ENTEROCOCCUS FAECALIS (A)  Final   Report Status 10/11/2023 FINAL  Final   Organism ID, Bacteria ENTEROCOCCUS FAECALIS (A)  Final      Susceptibility   Enterococcus faecalis - MIC*    AMPICILLIN  <=2 SENSITIVE Sensitive     NITROFURANTOIN <=16 SENSITIVE Sensitive     VANCOMYCIN 1 SENSITIVE Sensitive     * >=100,000 COLONIES/mL ENTEROCOCCUS FAECALIS         Radiology Studies: No results found.  Scheduled Meds:  atorvastatin   20 mg Oral QHS   Chlorhexidine  Gluconate Cloth  6 each Topical Daily   dolutegravir -rilpivirine   1 tablet Oral Daily   feeding supplement  237 mL Oral BID BM   finasteride   5 mg Oral Daily   folic acid   1 mg Oral Daily   heparin   5,000 Units Subcutaneous Q8H   insulin  aspart  0-6 Units Subcutaneous TID WC   metoprolol  succinate  100 mg Oral Daily   mirtazapine   7.5 mg Oral QHS   patiromer   25.2 g Oral Daily   sodium bicarbonate   650 mg Oral BID   sodium zirconium cyclosilicate   10 g Oral BID   sucralfate   1 g Oral BID   tamsulosin   0.4 mg Oral QPC supper   Continuous Infusions:   LOS: 4 days      Anthony CHRISTELLA Pouch, MD [facility] Pager [phone]  If 7PM-7AM, please contact night-coverage [email or web address]  10/12/2023, 8:37 AM

## 2023-10-12 NOTE — Plan of Care (Signed)
  Problem: Education: Goal: Ability to describe self-care measures that may prevent or decrease complications (Diabetes Survival Skills Education) will improve Outcome: Progressing   Problem: Coping: Goal: Ability to adjust to condition or change in health will improve Outcome: Progressing   Problem: Fluid Volume: Goal: Ability to maintain a balanced intake and output will improve Outcome: Progressing   Problem: Health Behavior/Discharge Planning: Goal: Ability to identify and utilize available resources and services will improve Outcome: Progressing   Problem: Metabolic: Goal: Ability to maintain appropriate glucose levels will improve Outcome: Progressing   Problem: Nutritional: Goal: Maintenance of adequate nutrition will improve Outcome: Progressing   Problem: Education: Goal: Knowledge of General Education information will improve Description: Including pain rating scale, medication(s)/side effects and non-pharmacologic comfort measures Outcome: Progressing   Problem: Health Behavior/Discharge Planning: Goal: Ability to manage health-related needs will improve Outcome: Progressing   Problem: Activity: Goal: Risk for activity intolerance will decrease Outcome: Progressing   Problem: Nutrition: Goal: Adequate nutrition will be maintained Outcome: Progressing   Problem: Coping: Goal: Level of anxiety will decrease Outcome: Progressing   Problem: Pain Managment: Goal: General experience of comfort will improve and/or be controlled Outcome: Progressing   Problem: Safety: Goal: Ability to remain free from injury will improve Outcome: Progressing

## 2023-10-12 NOTE — Progress Notes (Signed)
 Daily Progress Note   Patient Name: Jay Flores       Date: 10/12/2023 DOB: April 06, 1960  Age: 63 y.o. MRN#: [id number] Attending Physician: Trudy Anthony HERO, MD Primary Care Physician: [name], MD Admit Date: 10/08/2023  Reason for Consultation/Follow-up: Establishing goals of care  HPI/Brief Hospital Review: 63 y.o. male with past medical history of HTN, HLD, T2DM, COPD, CKD stage 3B, HIV with demenita, HCV, thrombocytopenia, recent history of prostate cancer-completed radiation treatment (07/2023), chronic indewelling Foley catheter, DVT/PE on Eliquis , iron  deficiency anemia and recent diagnosis of urothelial carcinoma/squamous cell carcinoma of bladder during recent admission 8/11-8/23 admitted from Group Home on 10/08/2023 with critical lab value of creatinine 13.9 from labs ordered by oncology.   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  Subjective: Extensive chart review has been completed prior to meeting patient including labs, vital signs, imaging, progress notes, orders, and available advanced directive documents from current and previous encounters.    Visited with Jay Flores at his bedside. He is resting in bed on his side, does not engage in  conversation, he is awake and alert--c/o generalized pain. Reports he was able to tolerate most of his breakfast.  Urine output noted in catheter bag. Creatinine continues to rise--16.44, potassium normal today. Bilateral nephrostomy tube placement scheduled for Monday with IR. Overall prognosis remains poor, nephrostomy tube placement likely to not improve overall prognosis.  Spoke with nursing staff about pain management.  Called and left HIPAA complaint VM with sister-[name], attempted to call number listed for sister-Faye, number provided was for a care home and staff that answered shared Nichole is not working today.  PMT will continue to follow and attempt to connect with sisters to discuss GOC--recommend comfort care/hospice care.   Objective:  Physical Exam Constitutional:      General: He is not in acute distress.    Appearance: He is ill-appearing.  Pulmonary:     Effort: Pulmonary effort is normal. No respiratory distress.  Skin:    General: Skin is warm and dry.  Neurological:     Mental Status: He is alert.     Motor: Weakness present.  Psychiatric:        Mood and Affect: Affect is flat.             Vital Signs: BP 132/69 (BP Location: Left Arm)   Pulse 82   Temp 97.7 F (36.5 C)   Resp 18   Ht 5' 8 (1.727 m)   Wt 40.1 kg   SpO2 100%   BMI 13.43 kg/m  SpO2: SpO2: 100 % O2 Device: O2 Device: Room Air O2 Flow Rate:     Palliative Care Assessment & Plan   Assessment/Recommendation/Plan  Overall poor prognosis Awaiting call back from sister to further discuss GOC Recommend comfort care/hospice  Care plan was discussed with primary team and nursing staff.  Thank you for allowing the Palliative Medicine Team to assist in the care of this patient.  Total time:  25 minutes  Time spent includes: Detailed review of medical records (labs, imaging, vital signs), medically appropriate exam (mental status, respiratory, cardiac, skin), discussed with treatment  team, counseling and educating patient, family and staff, documenting clinical information, medication management and coordination of care.  Waddell Lesches, DNP, AGNP-C Palliative Medicine   Please contact Palliative Medicine Team phone at [phone] for questions and concerns.

## 2023-10-13 DIAGNOSIS — N179 Acute kidney failure, unspecified: Secondary | ICD-10-CM | POA: Diagnosis not present

## 2023-10-13 DIAGNOSIS — Z515 Encounter for palliative care: Secondary | ICD-10-CM | POA: Diagnosis not present

## 2023-10-13 DIAGNOSIS — C679 Malignant neoplasm of bladder, unspecified: Secondary | ICD-10-CM | POA: Diagnosis not present

## 2023-10-13 LAB — CBC
HCT: 24.7 % — ABNORMAL LOW (ref 39.0–52.0)
Hemoglobin: 7.7 g/dL — ABNORMAL LOW (ref 13.0–17.0)
MCH: 27 pg (ref 26.0–34.0)
MCHC: 31.2 g/dL (ref 30.0–36.0)
MCV: 86.7 fL (ref 80.0–100.0)
Platelets: 264 K/uL (ref 150–400)
RBC: 2.85 MIL/uL — ABNORMAL LOW (ref 4.22–5.81)
RDW: 15.6 % — ABNORMAL HIGH (ref 11.5–15.5)
WBC: 14.2 K/uL — ABNORMAL HIGH (ref 4.0–10.5)
nRBC: 0 % (ref 0.0–0.2)

## 2023-10-13 LAB — BASIC METABOLIC PANEL WITH GFR
Anion gap: 18 — ABNORMAL HIGH (ref 5–15)
BUN: 120 mg/dL — ABNORMAL HIGH (ref 8–23)
CO2: 20 mmol/L — ABNORMAL LOW (ref 22–32)
Calcium: 8.6 mg/dL — ABNORMAL LOW (ref 8.9–10.3)
Chloride: 98 mmol/L (ref 98–111)
Creatinine, Ser: 17.96 mg/dL — ABNORMAL HIGH (ref 0.61–1.24)
GFR, Estimated: 3 mL/min — ABNORMAL LOW (ref >60)
Glucose, Bld: 132 mg/dL — ABNORMAL HIGH (ref 70–99)
Potassium: 4.7 mmol/L (ref 3.5–5.1)
Sodium: 136 mmol/L (ref 135–145)

## 2023-10-13 LAB — GLUCOSE, CAPILLARY
Glucose-Capillary: 158 mg/dL — ABNORMAL HIGH (ref 70–99)
Glucose-Capillary: 117 mg/dL — ABNORMAL HIGH (ref 70–99)
Glucose-Capillary: 195 mg/dL — ABNORMAL HIGH (ref 70–99)
Glucose-Capillary: 123 mg/dL — ABNORMAL HIGH (ref 70–99)

## 2023-10-13 MED ORDER — NICOTINE 7 MG/24HR TD PT24
7.0000 mg | MEDICATED_PATCH | Freq: Every day | TRANSDERMAL | Status: DC
  Administered 2023-10-13 – 2023-10-16 (×4): 7 mg via TRANSDERMAL
  Filled 2023-10-13 (×5): qty 1

## 2023-10-13 NOTE — Plan of Care (Signed)
  Problem: Health Behavior/Discharge Planning: Goal: Ability to identify and utilize available resources and services will improve Outcome: Progressing   Problem: Nutritional: Goal: Maintenance of adequate nutrition will improve Outcome: Progressing   Problem: Safety: Goal: Ability to remain free from injury will improve Outcome: Progressing   Problem: Skin Integrity: Goal: Risk for impaired skin integrity will decrease Outcome: Progressing

## 2023-10-13 NOTE — TOC Progression Note (Signed)
 Transition of Care [facility]) - Progression Note    Patient Details  Name: Jay Flores MRN: [id number] Date of Birth: 1960-12-12  [facility] CM/SW Contact  Marinda Cooks, RN Phone Number: 10/13/2023, 10:38 AM  Clinical Narrative:    Per chart review pt remains not medically cleared to dc today. TOC will cont to follow dc planning / care coordination needs and update as applicable.    Expected Discharge Plan: Group Home Barriers to Discharge: Continued Medical Work up    Expected Discharge Plan and Services   Discharge Planning Services: CM Consult   Living arrangements for the past 2 months: Group Home                       Social Drivers of Health (SDOH) Interventions SDOH Screenings   Food Insecurity: No Food Insecurity (10/08/2023)  Housing: Low Risk  (10/08/2023)  Transportation Needs: No Transportation Needs (10/08/2023)  Utilities: Not At Risk (10/08/2023)  Depression (PHQ2-9): Low Risk  (03/23/2022)  Tobacco Use: High Risk (10/08/2023)    Readmission Risk Interventions    07/31/2023    9:41 AM  Readmission Risk Prevention Plan  Transportation Screening Complete  Medication Review (RN Care Manager) Complete  PCP or Specialist appointment within 3-5 days of discharge Complete  SW Recovery Care/Counseling Consult Complete  Palliative Care Screening Not Applicable  Skilled Nursing Facility Not Applicable

## 2023-10-13 NOTE — Progress Notes (Signed)
 Daily Progress Note   Patient Name: Jay Flores       Date: 10/13/2023 DOB: 1960-02-17  Age: 63 y.o. MRN#: [id number] Attending Physician: Trudy Anthony HERO, MD Primary Care Physician: [name], MD Admit Date: 10/08/2023  Reason for Consultation/Follow-up: Establishing goals of care  HPI/Brief Hospital Review: 63 y.o. male with past medical history of HTN, HLD, T2DM, COPD, CKD stage 3B, HIV with demenita, HCV, thrombocytopenia, recent history of prostate cancer-completed radiation treatment (07/2023), chronic indewelling Foley catheter, DVT/PE on Eliquis , iron  deficiency anemia and recent diagnosis of urothelial carcinoma/squamous cell carcinoma of bladder during recent admission 8/11-8/23 admitted from Group Home on 10/08/2023 with critical lab value of creatinine 13.9 from labs ordered by oncology.   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  Subjective: Extensive chart review has been completed prior to meeting patient including labs, vital signs, imaging, progress notes, orders, and available advanced directive documents from current and previous encounters.    Visited with Jay Flores at his bedside. He is resting in bed, acknowledges my presence in room, flat  affect, does not engage much in conversation. No family or visitors at bedside during time of visit.  Called and spoke with sister-[name], she shares her understanding of conversation had with primary team yesterday, aware Jay Flores is approaching end of life. [name] shares she is planning to visit with Jay Flores again tomorrow. Plan set for PMT provider to meet with her at bedside for further GOC discussions, will also include [name] via speaker phone. [name] expresses interest in having Jay Flores transferred to a hospice facility in [place].   Answered and addressed all questions and concerns. PMT to continue to follow for ongoing needs and support.  Objective:  Physical Exam Constitutional:      General: He is not in acute distress.    Appearance: He is cachectic. He is ill-appearing.  Pulmonary:  Effort: Pulmonary effort is normal. No respiratory distress.  Skin:    General: Skin is warm and dry.  Neurological:     Mental Status: He is lethargic.  Psychiatric:        Mood and Affect: Affect is flat.             Vital Signs: BP (!) 145/65 (BP Location: Right Wrist)   Pulse 88   Temp 98.4 F (36.9 C) (Oral)   Resp 16   Ht 5' 8 (1.727 m)   Wt 40.1 kg   SpO2 100%   BMI 13.43 kg/m  SpO2: SpO2: 100 % O2 Device: O2 Device: Room Air O2 Flow Rate:     Palliative Care Assessment & Plan   Assessment/Recommendation/Plan  Recommend comfort care/hospice care Overall poor prognosis PMT to meet with sister for GOC discussion at bedside 9/8  Care plan was discussed with primary team.  Thank you for allowing the Palliative Medicine Team to assist in the care of this patient.  Total time:  25 minutes  Time spent includes: Detailed review of medical records (labs, imaging, vital signs), medically appropriate exam (mental status, respiratory, cardiac, skin), discussed with treatment team, counseling and educating patient, family and staff, documenting clinical  information, medication management and coordination of care.  Waddell Lesches, DNP, AGNP-C Palliative Medicine   Please contact Palliative Medicine Team phone at [phone] for questions and concerns.

## 2023-10-13 NOTE — Progress Notes (Signed)
 [facility]  ROUNDING NOTE   Subjective:   Patient seen resting quietly in bed  Creatinine 17.96   Objective:  Vital signs in last 24 hours:  Temp:  [97.9 F (36.6 C)-98.6 F (37 C)] 98.4 F (36.9 C) (09/07 0759) Pulse Rate:  [81-89] 88 (09/07 0759) Resp:  [16-18] 16 (09/07 0759) BP: (130-145)/(65-90) 145/65 (09/07 0759) SpO2:  [100 %] 100 % (09/07 0759)  Weight change:  Filed Weights   10/08/23 1342  Weight: 40.1 kg    Intake/Output: I/O last 3 completed shifts: In: 480 [P.O.:480] Out: -    Intake/Output this shift:  No intake/output data recorded.  Physical Exam: General: NAD, ill appearing  Head: Normocephalic, atraumatic. Moist oral mucosal membranes  Eyes: Anicteric  Lungs:  Clear to auscultation, room air  Heart: Regular rate and rhythm  Abdomen:  Soft, nontender  Extremities:  No peripheral edema.  Neurologic: Resting   Skin: Warm,dry, no rash  Access: None    Basic Metabolic Panel: Recent Labs  Lab 10/09/23 0432 10/09/23 1713 10/10/23 0838 10/10/23 2201 10/11/23 0331 10/12/23 0319 10/13/23 0249  NA 136  --  135  --  132* 132* 136  K 6.3*   < > 6.0* 5.9* 5.6* 4.7 4.7  CL 98  --  99  --  98 98 98  CO2 18*  --  19*  --  19* 18* 20*  GLUCOSE 125*  --  124*  --  115* 133* 132*  BUN 98*  --  101*  --  105* 109* 120*  CREATININE 13.87*  --  14.23*  --  15.31* 16.44* 17.96*  CALCIUM  8.6*  --  8.5*  --  8.5* 8.5* 8.6*   < > = values in this interval not displayed.    Liver Function Tests: Recent Labs  Lab 10/08/23 1136 10/08/23 1400 10/09/23 0432 10/10/23 0838  AST 12* 15 16 13*  ALT 7 7 7 8   ALKPHOS 86 84 72 70  BILITOT 0.4 0.5 0.4 0.5  PROT 7.1 6.9 6.1* 6.0*  ALBUMIN  2.3* 2.2* 1.9* 1.9*   No results for input(s): LIPASE, AMYLASE in the last 168 hours. No results for input(s): AMMONIA in the last 168 hours.  CBC: Recent Labs  Lab 10/08/23 1136 10/08/23 1400 10/09/23 0432 10/10/23 0838 10/11/23 0331  10/12/23 0319 10/13/23 0249  WBC 11.1*   < > 10.1 10.9* 10.3 10.7* 14.2*  NEUTROABS 8.6*  --   --   --   --   --   --   HGB 8.2*   < > 7.2* 7.5* 7.5* 7.6* 7.7*  HCT 27.1*   < > 22.7* 24.0* 23.8* 24.0* 24.7*  MCV 89.7   < > 86.3 86.6 86.2 87.0 86.7  PLT 266   < > 265 255 236 248 264   < > = values in this interval not displayed.    Cardiac Enzymes: No results for input(s): CKTOTAL, CKMB, CKMBINDEX, TROPONINI in the last 168 hours.  BNP: Invalid input(s): POCBNP  CBG: Recent Labs  Lab 10/12/23 0853 10/12/23 1214 10/12/23 1612 10/12/23 1946 10/13/23 0801  GLUCAP 140* 149* 152* 163* 158*    Microbiology: Results for orders placed or performed during the hospital encounter of 10/08/23  Urine Culture     Status: Abnormal   Collection Time: 10/08/23  4:48 PM   Specimen: Urine, Random  Result Value Ref Range Status   Specimen Description   Final    URINE, RANDOM Performed at [facility], [address] [place], [place] [address]    Special Requests   Final    NONE Reflexed from [id number] Performed at [facility], [address] [place], [place] [address]    Culture >=100,000 COLONIES/mL ENTEROCOCCUS FAECALIS (A)  Final   Report Status 10/11/2023 FINAL  Final   Organism ID, Bacteria ENTEROCOCCUS FAECALIS (A)  Final      Susceptibility   Enterococcus faecalis - MIC*    AMPICILLIN  <=2 SENSITIVE Sensitive     NITROFURANTOIN <=16 SENSITIVE Sensitive     VANCOMYCIN 1 SENSITIVE Sensitive     * >=100,000 COLONIES/mL ENTEROCOCCUS FAECALIS    Coagulation Studies: No results for input(s): LABPROT, INR in the last 72 hours.   Urinalysis: No results for input(s): COLORURINE, LABSPEC, PHURINE, GLUCOSEU, HGBUR, BILIRUBINUR, KETONESUR, PROTEINUR, UROBILINOGEN, NITRITE, LEUKOCYTESUR in the last 72 hours.  Invalid input(s): APPERANCEUR     Imaging: No results found.    Medications:     atorvastatin   20 mg Oral  QHS   Chlorhexidine  Gluconate Cloth  6 each Topical Daily   dolutegravir -rilpivirine   1 tablet Oral Daily   feeding supplement  237 mL Oral BID BM   finasteride   5 mg Oral Daily   folic acid   1 mg Oral Daily   heparin   5,000 Units Subcutaneous Q8H   insulin  aspart  0-6 Units Subcutaneous TID WC   metoprolol  succinate  100 mg Oral Daily   mirtazapine   7.5 mg Oral QHS   nicotine   7 mg Transdermal Daily   sodium bicarbonate   1,300 mg Oral BID   sucralfate   1 g Oral BID   tamsulosin   0.4 mg Oral QPC supper   acetaminophen  **OR** acetaminophen , loperamide , morphine  injection, ondansetron  **OR** ondansetron  (ZOFRAN ) IV, oxyCODONE , polyethylene glycol  Assessment/ Plan:  Jay Flores is a 63 y.o.  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  Acute kidney injury on chronic kidney disease stage IV baseline eGFR 23. 2.  Hyperkalemia serum potassium 6.3. 3.  Acute metabolic acidosis with serum bicarbonate of 18. 4.  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   Lab Results  Component Value Date   CREATININE 17.96 (H) 10/13/2023   CREATININE 16.44 (H) 10/12/2023   CREATININE 15.31 (H) 10/11/2023    Intake/Output Summary (Last 24 hours) at 10/13/2023 1000 Last data filed at 10/12/2023 1323 Gross per 24 hour  Intake 480 ml  Output --  Net 480 ml   Plan: We were consulted for evaluation management of acute kidney injury in the setting of known chronic kidney disease stage IV.  In late August his baseline creatinine ranged between 2.5-2.7 with an eGFR of 23.  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  He was not felt to be a surgical candidate here at our institution.  They did mention that bilateral nephrostomies could be considered.  Suspect that obstruction is playing a role in his acute kidney injury now.    Update: Creatinine continues to rise, 17.96, BUN 120 IR plans to place bilateral nephrostomy tubes on 9/8 Potassium remains stable  Continue low potassium diet  Sodium bicarb 20. Cotninue sodium bicarbonate  to 1300mg  twice daily If he is not a candidate for surgical therapy then it would not make sense to pursue renal placement therapy in the form of hemodialysis.   Prognosis remains poor and we would recommend further goals of care discussion that favor comfort measures.   As stated above, patient would not be a candidate for dialysis due to concurrent life threatening illnesses. We will sign off at this time.   LOS: 5 Lailoni Baquera 9/7/202510:00 AM

## 2023-10-13 NOTE — Progress Notes (Signed)
 PROGRESS NOTE    Jay Flores  FMW:[id number] DOB: 03/21/1960 DOA: 10/08/2023 PCP: [name], MD  Assessment & Plan:   Principal Problem:   AKI (acute kidney injury) (HCC) Active Problems:   Acute kidney injury superimposed on stage 3b chronic kidney disease (HCC)   Bilateral hydronephrosis   Bladder mass   DVT (deep venous thrombosis)_left leg   Pulmonary embolism (HCC)   HTN (hypertension)   Type II diabetes mellitus with renal manifestations (HCC)   HLD (hyperlipidemia)   HIV (human immunodeficiency virus infection) (HCC)   Anemia in chronic kidney disease (CKD)   Protein-calorie malnutrition, severe   Hepatitis C infection  Assessment and Plan: AKI on CKDIV: as per nephro. Cr continues to trend up daily, in the setting of bladder cancer w/ bilateral hydronephrosis. Continue w/ foley.  Pt previously refused percutaneous nephrostomy tubes but pt's sister, [name], wanted to find out if PCN could be placed but after pt's sister talked to IR, she wanted to hold off and think more about the procedure. Highly unlikely that PCN will change very poor prognosis. Not HD candidate as per nephro. High risk for further decline. Very poor prognosis. This was discussed w/ pt's sister, [name], who verbalized her understanding. Nephro following and recs apprec   Hyperkalemia: likely secondary to above. WNL today   Metabolic acidosis: secondary to AKI on CKDIV. Continue on sodium bicarb   Bladder cancer: not a candidate for treatment. F/u outpatient w/ onco    Hx of DVT/PE: holding eliquis . Continue on heparin    HTN: continue on home dose of metoprolol    HLD: continue on home dose of statin   DM2: likely poorly controlled. Continue on SSI w/ accuchecks    HIV: continue on home dose of juluca    HCV: continue w/ supportive care      DVT prophylaxis: heparin   Code Status:  full  Family Communication:  Disposition Plan: unclear  Level of care: Telemetry Medical  Status  is: Inpatient Remains inpatient appropriate because: severity of illness. High risk for further decline. Very poor prognosis     Consultants:  Uro Nephro Palliative care  Procedures:   Antimicrobials:   Subjective: Pt is oriented to self only   Objective: Vitals:   10/12/23 1755 10/12/23 1943 10/13/23 0454 10/13/23 0759  BP: 130/67 136/78 (!) 141/90 (!) 145/65  Pulse: 81 85 89 88  Resp: 16 16 18 16   Temp: 98.6 F (37 C) 98.6 F (37 C) 97.9 F (36.6 C) 98.4 F (36.9 C)  TempSrc:  Oral Oral Oral  SpO2: 100% 100% 100% 100%  Weight:      Height:        Intake/Output Summary (Last 24 hours) at 10/13/2023 0827 Last data filed at 10/12/2023 1323 Gross per 24 hour  Intake 480 ml  Output --  Net 480 ml   Filed Weights   10/08/23 1342  Weight: 40.1 kg    Examination:  General exam: appears lethargic   Respiratory system: decreased breath sounds b/l Cardiovascular system: S1 & S2+. No rubs or clicks  Gastrointestinal system: abd is soft, NT, ND, hypoactive bowel sounds  Central nervous system: lethargic  Psychiatry: judgement and insight appears poor. Flat mood and affect     Data Reviewed: I have personally reviewed following labs and imaging studies  CBC: Recent Labs  Lab 10/08/23 1136 10/08/23 1400 10/09/23 0432 10/10/23 0838 10/11/23 0331 10/12/23 0319 10/13/23 0249  WBC 11.1*   < > 10.1 10.9* 10.3 10.7* 14.2*  NEUTROABS 8.6*  --   --   --   --   --   --   HGB 8.2*   < > 7.2* 7.5* 7.5* 7.6* 7.7*  HCT 27.1*   < > 22.7* 24.0* 23.8* 24.0* 24.7*  MCV 89.7   < > 86.3 86.6 86.2 87.0 86.7  PLT 266   < > 265 255 236 248 264   < > = values in this interval not displayed.   Basic Metabolic Panel: Recent Labs  Lab 10/09/23 0432 10/09/23 1713 10/10/23 0838 10/10/23 2201 10/11/23 0331 10/12/23 0319 10/13/23 0249  NA 136  --  135  --  132* 132* 136  K 6.3*   < > 6.0* 5.9* 5.6* 4.7 4.7  CL 98  --  99  --  98 98 98  CO2 18*  --  19*  --  19* 18* 20*   GLUCOSE 125*  --  124*  --  115* 133* 132*  BUN 98*  --  101*  --  105* 109* 120*  CREATININE 13.87*  --  14.23*  --  15.31* 16.44* 17.96*  CALCIUM  8.6*  --  8.5*  --  8.5* 8.5* 8.6*   < > = values in this interval not displayed.   GFR: Estimated Creatinine Clearance: 2.4 mL/min (A) (by C-G formula based on SCr of 17.96 mg/dL (H)). Liver Function Tests: Recent Labs  Lab 10/08/23 1136 10/08/23 1400 10/09/23 0432 10/10/23 0838  AST 12* 15 16 13*  ALT 7 7 7 8   ALKPHOS 86 84 72 70  BILITOT 0.4 0.5 0.4 0.5  PROT 7.1 6.9 6.1* 6.0*  ALBUMIN  2.3* 2.2* 1.9* 1.9*   No results for input(s): LIPASE, AMYLASE in the last 168 hours. No results for input(s): AMMONIA in the last 168 hours. Coagulation Profile: Recent Labs  Lab 10/09/23 0432  INR 1.4*   Cardiac Enzymes: No results for input(s): CKTOTAL, CKMB, CKMBINDEX, TROPONINI in the last 168 hours. BNP (last 3 results) No results for input(s): PROBNP in the last 8760 hours. HbA1C: No results for input(s): HGBA1C in the last 72 hours.  CBG: Recent Labs  Lab 10/12/23 0853 10/12/23 1214 10/12/23 1612 10/12/23 1946 10/13/23 0801  GLUCAP 140* 149* 152* 163* 158*   Lipid Profile: No results for input(s): CHOL, HDL, LDLCALC, TRIG, CHOLHDL, LDLDIRECT in the last 72 hours. Thyroid Function Tests: No results for input(s): TSH, T4TOTAL, FREET4, T3FREE, THYROIDAB in the last 72 hours. Anemia Panel: No results for input(s): VITAMINB12, FOLATE, FERRITIN, TIBC, IRON , RETICCTPCT in the last 72 hours.  Sepsis Labs: No results for input(s): PROCALCITON, LATICACIDVEN in the last 168 hours.  Recent Results (from the past 240 hours)  Urine Culture     Status: Abnormal   Collection Time: 10/08/23  4:48 PM   Specimen: Urine, Random  Result Value Ref Range Status   Specimen Description   Final    URINE, RANDOM Performed at [facility], [address], [place], [place]  [address]    Special Requests   Final    NONE Reflexed from [id number] Performed at [facility], [address] [place], [place] [address]    Culture >=100,000 COLONIES/mL ENTEROCOCCUS FAECALIS (A)  Final   Report Status 10/11/2023 FINAL  Final   Organism ID, Bacteria ENTEROCOCCUS FAECALIS (A)  Final      Susceptibility   Enterococcus faecalis - MIC*    AMPICILLIN  <=2 SENSITIVE Sensitive     NITROFURANTOIN <=16 SENSITIVE Sensitive  VANCOMYCIN 1 SENSITIVE Sensitive     * >=100,000 COLONIES/mL ENTEROCOCCUS FAECALIS         Radiology Studies: No results found.       Scheduled Meds:  atorvastatin   20 mg Oral QHS   Chlorhexidine  Gluconate Cloth  6 each Topical Daily   dolutegravir -rilpivirine   1 tablet Oral Daily   feeding supplement  237 mL Oral BID BM   finasteride   5 mg Oral Daily   folic acid   1 mg Oral Daily   heparin   5,000 Units Subcutaneous Q8H   insulin  aspart  0-6 Units Subcutaneous TID WC   metoprolol  succinate  100 mg Oral Daily   mirtazapine   7.5 mg Oral QHS   sodium bicarbonate   1,300 mg Oral BID   sucralfate   1 g Oral BID   tamsulosin   0.4 mg Oral QPC supper   Continuous Infusions:   LOS: 5 days      Anthony CHRISTELLA Pouch, MD [facility] Pager [phone]  If 7PM-7AM, please contact night-coverage [email or web address]  10/13/2023, 8:27 AM

## 2023-10-14 DIAGNOSIS — N1831 Chronic kidney disease, stage 3a: Secondary | ICD-10-CM

## 2023-10-14 DIAGNOSIS — N179 Acute kidney failure, unspecified: Secondary | ICD-10-CM | POA: Diagnosis not present

## 2023-10-14 DIAGNOSIS — E1122 Type 2 diabetes mellitus with diabetic chronic kidney disease: Secondary | ICD-10-CM

## 2023-10-14 DIAGNOSIS — Z7189 Other specified counseling: Secondary | ICD-10-CM | POA: Diagnosis not present

## 2023-10-14 DIAGNOSIS — Z789 Other specified health status: Secondary | ICD-10-CM | POA: Diagnosis not present

## 2023-10-14 DIAGNOSIS — Z515 Encounter for palliative care: Secondary | ICD-10-CM | POA: Diagnosis not present

## 2023-10-14 LAB — CBC
HCT: 23.1 % — ABNORMAL LOW (ref 39.0–52.0)
Hemoglobin: 7.3 g/dL — ABNORMAL LOW (ref 13.0–17.0)
MCH: 27.1 pg (ref 26.0–34.0)
MCHC: 31.6 g/dL (ref 30.0–36.0)
MCV: 85.9 fL (ref 80.0–100.0)
Platelets: 247 K/uL (ref 150–400)
RBC: 2.69 MIL/uL — ABNORMAL LOW (ref 4.22–5.81)
RDW: 15.9 % — ABNORMAL HIGH (ref 11.5–15.5)
WBC: 15 K/uL — ABNORMAL HIGH (ref 4.0–10.5)
nRBC: 0 % (ref 0.0–0.2)

## 2023-10-14 LAB — BASIC METABOLIC PANEL WITH GFR
Anion gap: 18 — ABNORMAL HIGH (ref 5–15)
BUN: 124 mg/dL — ABNORMAL HIGH (ref 8–23)
CO2: 19 mmol/L — ABNORMAL LOW (ref 22–32)
Calcium: 8.6 mg/dL — ABNORMAL LOW (ref 8.9–10.3)
Chloride: 95 mmol/L — ABNORMAL LOW (ref 98–111)
Creatinine, Ser: 17.66 mg/dL — ABNORMAL HIGH (ref 0.61–1.24)
GFR, Estimated: 3 mL/min — ABNORMAL LOW (ref >60)
Glucose, Bld: 98 mg/dL (ref 70–99)
Potassium: 4.9 mmol/L (ref 3.5–5.1)
Sodium: 132 mmol/L — ABNORMAL LOW (ref 135–145)

## 2023-10-14 LAB — GLUCOSE, CAPILLARY
Glucose-Capillary: 127 mg/dL — ABNORMAL HIGH (ref 70–99)
Glucose-Capillary: 148 mg/dL — ABNORMAL HIGH (ref 70–99)
Glucose-Capillary: 213 mg/dL — ABNORMAL HIGH (ref 70–99)

## 2023-10-14 NOTE — Progress Notes (Signed)
 PROGRESS NOTE    Jay Flores  FMW:[id number] DOB: 12-Oct-1960 DOA: 10/08/2023 PCP: [name], MD  Assessment & Plan:   Principal Problem:   AKI (acute kidney injury) (HCC) Active Problems:   Acute kidney injury superimposed on stage 3b chronic kidney disease (HCC)   Bilateral hydronephrosis   Bladder mass   DVT (deep venous thrombosis)_left leg   Pulmonary embolism (HCC)   HTN (hypertension)   Type II diabetes mellitus with renal manifestations (HCC)   HLD (hyperlipidemia)   HIV (human immunodeficiency virus infection) (HCC)   Anemia in chronic kidney disease (CKD)   Protein-calorie malnutrition, severe   Hepatitis C infection  Assessment and Plan: AKI on CKDIV: as per nephro. Cr is trending up again today, in the setting of bladder cancer w/ bilateral hydronephrosis. Continue w/ foley. Highly unlikely that PCN will change very poor prognosis. Not HD candidate as per nephro. High risk for further decline. Poor prognosis. This was discussed w/ pt's sister, [name], who verbalized her understanding. Pt's sister is considering hospice. Palliative care is following and recs apprec   Hyperkalemia: likely secondary to above. WNL today   Metabolic acidosis: secondary to AKI on CKDIV. Continue on sodium bicarb   Bladder cancer: not a candidate for treatment. F/u outpatient w/ onco    Hx of DVT/PE: holding eliquis . Continue on heparin    HTN: continue on home dose of metoprolol     HLD: continue on home dose of statin  DM2: likely poorly controlled.  Continue on SSI w/ accuchecks    HIV: continue on home dose of juluca    HCV: continue w/ supportive care       DVT prophylaxis: heparin   Code Status:  full  Family Communication:  Disposition Plan: unclear  Level of care: Telemetry Medical  Status is: Inpatient Remains inpatient appropriate because: severity of illness. High risk for further decline. Very poor prognosis     Consultants:   Uro Nephro Palliative care  Procedures:   Antimicrobials:   Subjective: Pt is oriented to self.   Objective: Vitals:   10/13/23 1629 10/13/23 2149 10/14/23 0353 10/14/23 0814  BP: 131/75 (!) 145/85 (!) 145/85 (!) 141/76  Pulse: 75 88 88 81  Resp: 16 16 18 16   Temp: 98.3 F (36.8 C) 97.7 F (36.5 C) 98.2 F (36.8 C) 97.7 F (36.5 C)  TempSrc: Oral Oral Oral   SpO2: 100% 100% 100% 100%  Weight:      Height:        Intake/Output Summary (Last 24 hours) at 10/14/2023 9167 Last data filed at 10/13/2023 1900 Gross per 24 hour  Intake 720 ml  Output --  Net 720 ml   Filed Weights   10/08/23 1342  Weight: 40.1 kg    Examination:  General exam: appears confused  Respiratory system: diminished breath sounds b/l  Cardiovascular system: S1/S2+ Gastrointestinal system: abd is soft, NT, ND & hypoactive bowel sounds Central nervous system: lethargic  Psychiatry: judgement and insight appears poor. Flat mood and affect    Data Reviewed: I have personally reviewed following labs and imaging studies  CBC: Recent Labs  Lab 10/08/23 1136 10/08/23 1400 10/10/23 0838 10/11/23 0331 10/12/23 0319 10/13/23 0249 10/14/23 0259  WBC 11.1*   < > 10.9* 10.3 10.7* 14.2* 15.0*  NEUTROABS 8.6*  --   --   --   --   --   --   HGB 8.2*   < > 7.5* 7.5* 7.6* 7.7* 7.3*  HCT 27.1*   < >  24.0* 23.8* 24.0* 24.7* 23.1*  MCV 89.7   < > 86.6 86.2 87.0 86.7 85.9  PLT 266   < > 255 236 248 264 247   < > = values in this interval not displayed.   Basic Metabolic Panel: Recent Labs  Lab 10/10/23 0838 10/10/23 2201 10/11/23 0331 10/12/23 0319 10/13/23 0249 10/14/23 0259  NA 135  --  132* 132* 136 132*  K 6.0* 5.9* 5.6* 4.7 4.7 4.9  CL 99  --  98 98 98 95*  CO2 19*  --  19* 18* 20* 19*  GLUCOSE 124*  --  115* 133* 132* 98  BUN 101*  --  105* 109* 120* 124*  CREATININE 14.23*  --  15.31* 16.44* 17.96* 17.66*  CALCIUM  8.5*  --  8.5* 8.5* 8.6* 8.6*   GFR: Estimated Creatinine  Clearance: 2.5 mL/min (A) (by C-G formula based on SCr of 17.66 mg/dL (H)). Liver Function Tests: Recent Labs  Lab 10/08/23 1136 10/08/23 1400 10/09/23 0432 10/10/23 0838  AST 12* 15 16 13*  ALT 7 7 7 8   ALKPHOS 86 84 72 70  BILITOT 0.4 0.5 0.4 0.5  PROT 7.1 6.9 6.1* 6.0*  ALBUMIN  2.3* 2.2* 1.9* 1.9*   No results for input(s): LIPASE, AMYLASE in the last 168 hours. No results for input(s): AMMONIA in the last 168 hours. Coagulation Profile: Recent Labs  Lab 10/09/23 0432  INR 1.4*   Cardiac Enzymes: No results for input(s): CKTOTAL, CKMB, CKMBINDEX, TROPONINI in the last 168 hours. BNP (last 3 results) No results for input(s): PROBNP in the last 8760 hours. HbA1C: No results for input(s): HGBA1C in the last 72 hours.  CBG: Recent Labs  Lab 10/13/23 0801 10/13/23 1154 10/13/23 1636 10/13/23 2151 10/14/23 0815  GLUCAP 158* 117* 195* 123* 127*   Lipid Profile: No results for input(s): CHOL, HDL, LDLCALC, TRIG, CHOLHDL, LDLDIRECT in the last 72 hours. Thyroid Function Tests: No results for input(s): TSH, T4TOTAL, FREET4, T3FREE, THYROIDAB in the last 72 hours. Anemia Panel: No results for input(s): VITAMINB12, FOLATE, FERRITIN, TIBC, IRON , RETICCTPCT in the last 72 hours.  Sepsis Labs: No results for input(s): PROCALCITON, LATICACIDVEN in the last 168 hours.  Recent Results (from the past 240 hours)  Urine Culture     Status: Abnormal   Collection Time: 10/08/23  4:48 PM   Specimen: Urine, Random  Result Value Ref Range Status   Specimen Description   Final    URINE, RANDOM Performed at [facility], [address] [place], [place] [address]    Special Requests   Final    NONE Reflexed from [id number] Performed at [facility], [address] [place], [place] [address]    Culture >=100,000 COLONIES/mL ENTEROCOCCUS FAECALIS (A)  Final   Report Status 10/11/2023 FINAL  Final   Organism  ID, Bacteria ENTEROCOCCUS FAECALIS (A)  Final      Susceptibility   Enterococcus faecalis - MIC*    AMPICILLIN  <=2 SENSITIVE Sensitive     NITROFURANTOIN <=16 SENSITIVE Sensitive     VANCOMYCIN 1 SENSITIVE Sensitive     * >=100,000 COLONIES/mL ENTEROCOCCUS FAECALIS         Radiology Studies: No results found.       Scheduled Meds:  atorvastatin   20 mg Oral QHS   Chlorhexidine  Gluconate Cloth  6 each Topical Daily   dolutegravir -rilpivirine   1 tablet Oral Daily   feeding supplement  237 mL Oral BID BM   finasteride   5 mg Oral  Daily   folic acid   1 mg Oral Daily   heparin   5,000 Units Subcutaneous Q8H   insulin  aspart  0-6 Units Subcutaneous TID WC   metoprolol  succinate  100 mg Oral Daily   mirtazapine   7.5 mg Oral QHS   nicotine   7 mg Transdermal Daily   sodium bicarbonate   1,300 mg Oral BID   sucralfate   1 g Oral BID   tamsulosin   0.4 mg Oral QPC supper   Continuous Infusions:   LOS: 6 days      Anthony CHRISTELLA Pouch, MD [facility] Pager [phone]  If 7PM-7AM, please contact night-coverage [email or web address]  10/14/2023, 8:32 AM

## 2023-10-14 NOTE — Progress Notes (Addendum)
 Palliative Care Progress Note, Assessment & Plan   Patient Name: Jay Flores       Date: 10/14/2023 DOB: 01/18/1961  Age: 63 y.o. MRN#: [id number] Attending Physician: Trudy Anthony HERO, MD Primary Care Physician: [name], MD Admit Date: 10/08/2023  Subjective: Reports sleeping well last night. Complains of L side pain. He has not eaten breakfast this morning. Denies CP/SOB. Reports sister has not visited today.   HPI: Per previous HPI: 63 y.o. male with past medical history of HTN, HLD, T2DM, COPD, CKD stage 3B, HIV with demenita, HCV, thrombocytopenia, recent history of prostate cancer-completed radiation treatment (07/2023), chronic indewelling Foley catheter, DVT/PE on Eliquis , iron  deficiency anemia and recent diagnosis of urothelial carcinoma/squamous cell carcinoma of bladder during recent admission 8/11-8/23 admitted from Group Home on 10/08/2023 with critical lab value of creatinine 13.9 from labs ordered by oncology.   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  Summary of counseling/coordination of care: Extensive chart review completed prior to meeting patient including labs, vital signs, imaging, progress notes, orders, and available advanced directive documents from  current and previous encounters.     Latest Ref Rng & Units 10/14/2023    2:59 AM 10/13/2023    2:49 AM 10/12/2023    3:19 AM  CBC  WBC 4.0 - 10.5 K/uL 15.0  14.2  10.7   Hemoglobin 13.0 - 17.0 g/dL 7.3  7.7  7.6   Hematocrit 39.0 - 52.0 % 23.1  24.7  24.0   Platelets 150 - 400 K/uL 247  264  248       Latest Ref Rng & Units 10/14/2023    2:59 AM 10/13/2023    2:49 AM 10/12/2023    3:19 AM  CMP  Glucose 70 - 99 mg/dL 98  867  866   BUN 8 - 23 mg/dL 875  879  890   Creatinine 0.61 - 1.24 mg/dL 82.33  82.03  83.55   Sodium 135 - 145 mmol/L 132  136  132   Potassium 3.5 - 5.1 mmol/L 4.9  4.7  4.7   Chloride 98 - 111 mmol/L 95  98  98   CO2 22 - 32 mmol/L 19  20  18    Calcium  8.9 - 10.3 mg/dL 8.6  8.6  8.5     After reviewing the patient's chart and assessing the patient at bedside, I spoke with patient in regards to symptom management and goals of care.   Ill-appearing male sitting upright in recliner watching TV. He is alert and oriented to self and DOB. He is unable to state location, time or president. He is able to participate in conversation. Respirations are even and unlabored. He is in no distress.   Discussed with Dr. Trudy plan to meet with  sister today about possible plan to transfer to a hospice facility in [place]. Discussed holding off on IR PCN placement until goals of care are finalized. NPO order for procedure d/c for now.   Discussed with IR PA plan to hold off on bilateral PCN placement until plan of care has been decided.   1420: Met with patient, [name] (sister) and niece at bedside for GOC discussion. [name] shares that she does not want to put her brother through procedures that will not change his outcome. She wants her brother to be transferred to Hospice home in [place] to be closer to her and family. Family not interested in comfort care at this time and wants to continue medical treatment until transfer.   Notified care team of sister's request.  TOC and [facility]  liaison engaged in facilitating transfer.   Therapeutic silence and active listening provided for patient and family to share their thoughts and emotions regarding current medical situation.  Emotional support provided.  Physical Exam Vitals reviewed.  Constitutional:      General: He is not in acute distress.    Appearance: He is ill-appearing.  HENT:     Head: Normocephalic and atraumatic.     Mouth/Throat:     Mouth: Mucous membranes are dry.  Pulmonary:     Effort: Pulmonary effort is normal. No respiratory distress.  Musculoskeletal:     Right lower leg: No edema.     Left lower leg: No edema.  Skin:    General: Skin is warm and dry.  Neurological:     Mental Status: He is alert. He is disoriented.  Psychiatric:        Mood and Affect: Mood normal.        Behavior: Behavior normal.   Recommendations/Plan: Continue DNR/DNI status as previously documented    Continue current supportive interventions Plan to transfer to hospice home in [place], [place] [facility] engaged in facilitating transfer  Total Time 50 minutes   Discussed plan of care with Dr. Trudy, IR PA, TOC, [place], [facility] liaison and primary RN.   Time spent includes: Detailed review of medical records (labs, imaging, vital signs), medically appropriate exam (mental status, respiratory, cardiac, skin), discussed with treatment team, counseling and educating patient, family and staff, documenting clinical information, medication management and coordination of care.     Devere Sacks, AMANDA [facility] Palliative Medicine Team  10/14/2023 12:48 PM  Office [phone]  Pager [phone]

## 2023-10-14 NOTE — Plan of Care (Signed)

## 2023-10-14 NOTE — TOC Progression Note (Addendum)
 Transition of Care [facility]) - Progression Note    Patient Details  Name: Jay Flores MRN: [id number] Date of Birth: 01-18-1961  Transition of Care [facility]) CM/SW Contact  [place] Cor[place], [place] Phone Number: 10/14/2023, 3:16 PM  Clinical Narrative:     CSW spoke with [facility], regarding hospice home. CSW will need to fax H&P, progress notes and demographics.   3:27pm-Patient information faxed to [facility] for review.     Expected Discharge Plan: Group Home Barriers to Discharge: Continued Medical Work up               Expected Discharge Plan and Services   Discharge Planning Services: CM Consult   Living arrangements for the past 2 months: Group Home                                       Social Drivers of Health (SDOH) Interventions SDOH Screenings   Food Insecurity: No Food Insecurity (10/08/2023)  Housing: Low Risk  (10/08/2023)  Transportation Needs: No Transportation Needs (10/08/2023)  Utilities: Not At Risk (10/08/2023)  Depression (PHQ2-9): Low Risk  (03/23/2022)  Tobacco Use: High Risk (10/08/2023)    Readmission Risk Interventions    07/31/2023    9:41 AM  Readmission Risk Prevention Plan  Transportation Screening Complete  Medication Review (RN Care Manager) Complete  PCP or Specialist appointment within 3-5 days of discharge Complete  SW Recovery Care/Counseling Consult Complete  Palliative Care Screening Not Applicable  Skilled Nursing Facility Not Applicable

## 2023-10-15 DIAGNOSIS — N179 Acute kidney failure, unspecified: Secondary | ICD-10-CM | POA: Diagnosis not present

## 2023-10-15 LAB — CBC
HCT: 24.4 % — ABNORMAL LOW (ref 39.0–52.0)
Hemoglobin: 7.7 g/dL — ABNORMAL LOW (ref 13.0–17.0)
MCH: 27.6 pg (ref 26.0–34.0)
MCHC: 31.6 g/dL (ref 30.0–36.0)
MCV: 87.5 fL (ref 80.0–100.0)
Platelets: 236 K/uL (ref 150–400)
RBC: 2.79 MIL/uL — ABNORMAL LOW (ref 4.22–5.81)
RDW: 15.7 % — ABNORMAL HIGH (ref 11.5–15.5)
WBC: 12.8 K/uL — ABNORMAL HIGH (ref 4.0–10.5)
nRBC: 0 % (ref 0.0–0.2)

## 2023-10-15 LAB — BASIC METABOLIC PANEL WITH GFR
Anion gap: 22 — ABNORMAL HIGH (ref 5–15)
BUN: 144 mg/dL — ABNORMAL HIGH (ref 8–23)
CO2: 21 mmol/L — ABNORMAL LOW (ref 22–32)
Calcium: 8.5 mg/dL — ABNORMAL LOW (ref 8.9–10.3)
Chloride: 93 mmol/L — ABNORMAL LOW (ref 98–111)
Creatinine, Ser: 18.75 mg/dL — ABNORMAL HIGH (ref 0.61–1.24)
GFR, Estimated: 3 mL/min — ABNORMAL LOW (ref >60)
Glucose, Bld: 144 mg/dL — ABNORMAL HIGH (ref 70–99)
Potassium: 4.9 mmol/L (ref 3.5–5.1)
Sodium: 136 mmol/L (ref 135–145)

## 2023-10-15 LAB — GLUCOSE, CAPILLARY
Glucose-Capillary: 116 mg/dL — ABNORMAL HIGH (ref 70–99)
Glucose-Capillary: 125 mg/dL — ABNORMAL HIGH (ref 70–99)
Glucose-Capillary: 127 mg/dL — ABNORMAL HIGH (ref 70–99)
Glucose-Capillary: 128 mg/dL — ABNORMAL HIGH (ref 70–99)
Glucose-Capillary: 155 mg/dL — ABNORMAL HIGH (ref 70–99)

## 2023-10-15 NOTE — TOC Progression Note (Addendum)
 Transition of Care [facility]) - Progression Note    Patient Details  Name: Jay Flores MRN: [id number] Date of Birth: [birth year]  [facility]) CM/SW Contact  [place], [place] Phone Number: 10/15/2023, 9:19 AM  Clinical Narrative:     CSW received a call from [place], [facility]. CSW was advised that a nurse, Luke, will come to [facility] to evaluate patient. Right now there are no beds available however, that is subject to change as it is a hospice home.   Group Home owner, Lennette Dade, is the HCPOA. Quantay advised that she has been unable to get in touch with [name], patients sister. CSW advised that although Ms. Ray is the HCPOA, she will still include sisters in decision making.   Medical team notified.   Expected Discharge Plan: Group Home Barriers to Discharge: Continued Medical Work up               Expected Discharge Plan and Services   Discharge Planning Services: CM Consult   Living arrangements for the past 2 months: Group Home                                       Social Drivers of Health (SDOH) Interventions SDOH Screenings   Food Insecurity: No Food Insecurity (10/08/2023)  Housing: Low Risk  (10/08/2023)  Transportation Needs: No Transportation Needs (10/08/2023)  Utilities: Not At Risk (10/08/2023)  Depression (PHQ2-9): Low Risk  (03/23/2022)  Tobacco Use: High Risk (10/08/2023)    Readmission Risk Interventions    07/31/2023    9:41 AM  Readmission Risk Prevention Plan  Transportation Screening Complete  Medication Review (RN Care Manager) Complete  PCP or Specialist appointment within 3-5 days of discharge Complete  SW Recovery Care/Counseling Consult Complete  Palliative Care Screening Not Applicable  Skilled Nursing Facility Not Applicable

## 2023-10-15 NOTE — Plan of Care (Signed)
  Problem: Education: Goal: Ability to describe self-care measures that may prevent or decrease complications (Diabetes Survival Skills Education) will improve Outcome: Progressing Goal: Individualized Educational Video(s) Outcome: Progressing   Problem: Coping: Goal: Ability to adjust to condition or change in health will improve Outcome: Progressing   Problem: Fluid Volume: Goal: Ability to maintain a balanced intake and output will improve Outcome: Progressing   Problem: Health Behavior/Discharge Planning: Goal: Ability to identify and utilize available resources and services will improve Outcome: Progressing   Problem: Metabolic: Goal: Ability to maintain appropriate glucose levels will improve Outcome: Progressing   Problem: Nutritional: Goal: Maintenance of adequate nutrition will improve Outcome: Progressing Goal: Progress toward achieving an optimal weight will improve Outcome: Progressing   Problem: Skin Integrity: Goal: Risk for impaired skin integrity will decrease Outcome: Progressing   Problem: Tissue Perfusion: Goal: Adequacy of tissue perfusion will improve Outcome: Progressing   Problem: Education: Goal: Knowledge of General Education information will improve Description: Including pain rating scale, medication(s)/side effects and non-pharmacologic comfort measures Outcome: Progressing   Problem: Health Behavior/Discharge Planning: Goal: Ability to manage health-related needs will improve Outcome: Progressing   Problem: Clinical Measurements: Goal: Ability to maintain clinical measurements within normal limits will improve Outcome: Progressing

## 2023-10-15 NOTE — Progress Notes (Signed)
 Patient refusing to ambulate to chair or allow staff to complete a CHG bath. Patient stated if we took him outside to smoke first that we could. Then he stated  go bug someone else  MD aware.

## 2023-10-15 NOTE — Progress Notes (Signed)
 PROGRESS NOTE   HPI was taken from Dr. Roann: Jay Flores is a pleasant 63 y.o. male with medical history significant for diabetes, bladder cancer under the care of oncologist, hepatitis C, COPD, HIV, HTN, DVT/PE on Eliquis  who was brought in by ambulance from [facility] care facility group home for abnormal creatinine of 13.9, baseline creatinine of 3.  Patient also complained of abdominal pain, back pain which is 7/10 in intensity, constant, worsening with movement for the last 3 days.  Patient is not a good historian.  Patient is stated that his pain is around suprapubic area.  He was recently discharged from the hospital on 09/28/2023 where he was admitted for urinary retention.  Patient had a CT scan which showed distended bladder with bladder wall diverticulum.  Patient did undergo cystoscopy with clot evacuation on 09/19/2023.  Today patient's symptoms are very nonspecific except pain.  Patient had a blood work done which showed creatinine significantly elevated than before so he was sent to ED for evaluation for admission for AKI.   ED Course: Upon arrival to the ED, patient is found to have normal vital signs.  Started on IV fluid.  They discussed the case with Dr. Francisca the urologist and advised that patient has aggressive bladder cancer that was resected and suspected for obstruction of the kidneys.  This could be related to hydronephrosis.  Urologist does not want us  to replace the Foley but agree with proceeding with a CT imaging and they will consult to discuss with the patient.  In previous admission patient refused nephrostomy tubes and so that is why he feels like this is probably happening today.  Hospitalist service was consulted for evaluation for admission.   ALI MOHL  FMW:[id number] DOB: Mar 21, 1960 DOA: 10/08/2023 PCP: [name], MD  Assessment & Plan:   Principal Problem:   AKI (acute kidney injury) [facility]) Active Problems:   Acute kidney injury  superimposed on stage 3b chronic kidney disease (HCC)   Bilateral hydronephrosis   Bladder mass   DVT (deep venous thrombosis)_left leg   Pulmonary embolism (HCC)   HTN (hypertension)   Type II diabetes mellitus with renal manifestations (HCC)   HLD (hyperlipidemia)   HIV (human immunodeficiency virus infection) (HCC)   Anemia in chronic kidney disease (CKD)   Protein-calorie malnutrition, severe   Hepatitis C infection  Assessment and Plan: AKI on CKDIV: as per nephro. Cr is trending up daily, in the setting of bladder cancer w/ bilateral hydronephrosis. Continue w/ foley. Highly unlikely that PCN will change very poor prognosis. Not HD candidate as per nephro. High risk for further decline. Poor prognosis. This was discussed w/ pt's sister, [name], who verbalized her understanding. Palliative care is following and recs apprec. CM is working on getting pt into hospice in [place]. See CM's notes  Hyperkalemia: likely secondary to above. WNL tdoay   Metabolic acidosis: secondary to AKI on CKDIV. Continue on sodium bicarb  Bladder cancer: not a candidate for treatment. F/u outpatient w/ onco    Hx of DVT/PE: holding eliquis . Continue on heparin    HTN: continue on home dose of metoprolol    HLD: continue on home dose of statin   DM2: likely poorly controlled. Continue on SSI w/ accuchecks    HIV: continue on home dose of juluca    HCV: continue w/ supportive care       DVT prophylaxis: heparin   Code Status:  full  Family Communication:  Disposition Plan: unclear  Level of care: Telemetry Medical  Status is: Inpatient Remains inpatient appropriate because: severity of illness. High risk for further decline. Very poor prognosis. CM is trying to get pt placed in hospice in [place], see CM's notes.     Consultants:  Uro Nephro Palliative care  Procedures:   Antimicrobials:   Subjective: Pt is still oriented to self only   Objective: Vitals:   10/14/23 1648  10/14/23 1936 10/15/23 0427 10/15/23 0806  BP: 121/73 118/71 (!) 129/59 121/66  Pulse: 78 81 85 79  Resp: 16 16 18 18   Temp:  99.5 F (37.5 C) 98.1 F (36.7 C) 97.9 F (36.6 C)  TempSrc:  Oral  Oral  SpO2: 100% 100% 100% 100%  Weight:      Height:        Intake/Output Summary (Last 24 hours) at 10/15/2023 0837 Last data filed at 10/14/2023 1900 Gross per 24 hour  Intake 200 ml  Output 200 ml  Net 0 ml   Filed Weights   10/08/23 1342  Weight: 40.1 kg    Examination:  General exam: appears calm but confused Respiratory system: decreased breath sounds b/l  Cardiovascular system: S1 & S2+. No rubs or clicks Gastrointestinal system: abd is soft, NT, ND & hypoactive bowel sounds Central nervous system: lethargic  Psychiatry: judgement and insight appears poor. Flat mood and affect    Data Reviewed: I have personally reviewed following labs and imaging studies  CBC: Recent Labs  Lab 10/08/23 1136 10/08/23 1400 10/11/23 0331 10/12/23 0319 10/13/23 0249 10/14/23 0259 10/15/23 0331  WBC 11.1*   < > 10.3 10.7* 14.2* 15.0* 12.8*  NEUTROABS 8.6*  --   --   --   --   --   --   HGB 8.2*   < > 7.5* 7.6* 7.7* 7.3* 7.7*  HCT 27.1*   < > 23.8* 24.0* 24.7* 23.1* 24.4*  MCV 89.7   < > 86.2 87.0 86.7 85.9 87.5  PLT 266   < > 236 248 264 247 236   < > = values in this interval not displayed.   Basic Metabolic Panel: Recent Labs  Lab 10/11/23 0331 10/12/23 0319 10/13/23 0249 10/14/23 0259 10/15/23 0331  NA 132* 132* 136 132* 136  K 5.6* 4.7 4.7 4.9 4.9  CL 98 98 98 95* 93*  CO2 19* 18* 20* 19* 21*  GLUCOSE 115* 133* 132* 98 144*  BUN 105* 109* 120* 124* 144*  CREATININE 15.31* 16.44* 17.96* 17.66* 18.75*  CALCIUM  8.5* 8.5* 8.6* 8.6* 8.5*   GFR: Estimated Creatinine Clearance: 2.3 mL/min (A) (by C-G formula based on SCr of 18.75 mg/dL (H)). Liver Function Tests: Recent Labs  Lab 10/08/23 1136 10/08/23 1400 10/09/23 0432 10/10/23 0838  AST 12* 15 16 13*  ALT 7 7  7 8   ALKPHOS 86 84 72 70  BILITOT 0.4 0.5 0.4 0.5  PROT 7.1 6.9 6.1* 6.0*  ALBUMIN  2.3* 2.2* 1.9* 1.9*   No results for input(s): LIPASE, AMYLASE in the last 168 hours. No results for input(s): AMMONIA in the last 168 hours. Coagulation Profile: Recent Labs  Lab 10/09/23 0432  INR 1.4*   Cardiac Enzymes: No results for input(s): CKTOTAL, CKMB, CKMBINDEX, TROPONINI in the last 168 hours. BNP (last 3 results) No results for input(s): PROBNP in the last 8760 hours. HbA1C: No results for input(s): HGBA1C in the last 72 hours.  CBG: Recent Labs  Lab 10/14/23 0815 10/14/23 1147 10/14/23 1644 10/15/23 0010 10/15/23 0806  GLUCAP 127* 148* 213* 128* 125*  Lipid Profile: No results for input(s): CHOL, HDL, LDLCALC, TRIG, CHOLHDL, LDLDIRECT in the last 72 hours. Thyroid Function Tests: No results for input(s): TSH, T4TOTAL, FREET4, T3FREE, THYROIDAB in the last 72 hours. Anemia Panel: No results for input(s): VITAMINB12, FOLATE, FERRITIN, TIBC, IRON , RETICCTPCT in the last 72 hours.  Sepsis Labs: No results for input(s): PROCALCITON, LATICACIDVEN in the last 168 hours.  Recent Results (from the past 240 hours)  Urine Culture     Status: Abnormal   Collection Time: 10/08/23  4:48 PM   Specimen: Urine, Random  Result Value Ref Range Status   Specimen Description   Final    URINE, RANDOM Performed at [facility], [address] [place], [place] [address]    Special Requests   Final    NONE Reflexed from [id number] Performed at [facility], [address] [place], [place] [address]    Culture >=100,000 COLONIES/mL ENTEROCOCCUS FAECALIS (A)  Final   Report Status 10/11/2023 FINAL  Final   Organism ID, Bacteria ENTEROCOCCUS FAECALIS (A)  Final      Susceptibility   Enterococcus faecalis - MIC*    AMPICILLIN  <=2 SENSITIVE Sensitive     NITROFURANTOIN <=16 SENSITIVE Sensitive     VANCOMYCIN 1  SENSITIVE Sensitive     * >=100,000 COLONIES/mL ENTEROCOCCUS FAECALIS         Radiology Studies: No results found.       Scheduled Meds:  atorvastatin   20 mg Oral QHS   Chlorhexidine  Gluconate Cloth  6 each Topical Daily   dolutegravir -rilpivirine   1 tablet Oral Daily   feeding supplement  237 mL Oral BID BM   finasteride   5 mg Oral Daily   folic acid   1 mg Oral Daily   heparin   5,000 Units Subcutaneous Q8H   insulin  aspart  0-6 Units Subcutaneous TID WC   metoprolol  succinate  100 mg Oral Daily   mirtazapine   7.5 mg Oral QHS   nicotine   7 mg Transdermal Daily   sodium bicarbonate   1,300 mg Oral BID   sucralfate   1 g Oral BID   tamsulosin   0.4 mg Oral QPC supper   Continuous Infusions:   LOS: 7 days      Anthony CHRISTELLA Pouch, MD [facility] Pager [phone]  If 7PM-7AM, please contact night-coverage [email or web address]  10/15/2023, 8:37 AM

## 2023-10-15 NOTE — Plan of Care (Signed)
 The patient is alert and oriented x 2, forgetful. Foley catheter is patent draining amber urine. Denies any c/o pain or discomfort.

## 2023-10-16 ENCOUNTER — Inpatient Hospital Stay: Admitting: Hospice and Palliative Medicine

## 2023-10-16 ENCOUNTER — Ambulatory Visit: Admitting: Radiation Oncology

## 2023-10-16 ENCOUNTER — Inpatient Hospital Stay

## 2023-10-16 ENCOUNTER — Inpatient Hospital Stay: Admitting: Oncology

## 2023-10-16 ENCOUNTER — Other Ambulatory Visit

## 2023-10-16 DIAGNOSIS — N179 Acute kidney failure, unspecified: Secondary | ICD-10-CM | POA: Diagnosis not present

## 2023-10-16 LAB — BASIC METABOLIC PANEL WITH GFR
Anion gap: 22 — ABNORMAL HIGH (ref 5–15)
BUN: 149 mg/dL — ABNORMAL HIGH (ref 8–23)
CO2: 21 mmol/L — ABNORMAL LOW (ref 22–32)
Calcium: 8.7 mg/dL — ABNORMAL LOW (ref 8.9–10.3)
Chloride: 92 mmol/L — ABNORMAL LOW (ref 98–111)
Creatinine, Ser: 19.53 mg/dL — ABNORMAL HIGH (ref 0.61–1.24)
GFR, Estimated: 2 mL/min — ABNORMAL LOW (ref >60)
Glucose, Bld: 156 mg/dL — ABNORMAL HIGH (ref 70–99)
Potassium: 4.8 mmol/L (ref 3.5–5.1)
Sodium: 135 mmol/L (ref 135–145)

## 2023-10-16 LAB — GLUCOSE, CAPILLARY
Glucose-Capillary: 148 mg/dL — ABNORMAL HIGH (ref 70–99)
Glucose-Capillary: 162 mg/dL — ABNORMAL HIGH (ref 70–99)

## 2023-10-16 MED ORDER — OXYCODONE HCL 5 MG PO TABS: 5.0000 mg | ORAL_TABLET | ORAL | 0 refills | Status: DC | PRN

## 2023-10-16 NOTE — Plan of Care (Signed)
 Brief Interventional Radiology Note:  IR with request to place bilateral nephrostomy tubes for this patient. Family has decided to forgo any procedures at this time and is electing to transfer patient to Hospice home in [place].   IR will delete the order.  Should goals of care change or family wish to pursue this in the future, please place a new order and reach out to our team with any questions or concerns.   Electronically Signed: Heatherly Stenner M Herminio Kniskern, PA-C 10/16/2023, 8:54 AM

## 2023-10-16 NOTE — TOC Transition Note (Signed)
 Transition of Care [facility]) - Discharge Note   Patient Details  Name: Jay Flores MRN: [id number] Date of Birth: 04/26/1960  Transition of Care [facility] CM/SW Contact:  [place] L Deondrick Searls, LCSW Phone Number: 10/16/2023, 12:36 PM   Clinical Narrative:     Patient offered bed at [facility]. They will schedule transportation. Call to report number provided to RN.   No further TOC needs. TOC signing off.     Barriers to Discharge: Continued Medical Work up   Patient Goals and CMS Choice            Discharge Placement                       Discharge Plan and Services Additional resources added to the After Visit Summary for     Discharge Planning Services: CM Consult                                 Social Drivers of Health (SDOH) Interventions SDOH Screenings   Food Insecurity: No Food Insecurity (10/08/2023)  Housing: Low Risk  (10/08/2023)  Transportation Needs: No Transportation Needs (10/08/2023)  Utilities: Not At Risk (10/08/2023)  Depression (PHQ2-9): Low Risk  (03/23/2022)  Tobacco Use: High Risk (10/08/2023)     Readmission Risk Interventions    07/31/2023    9:41 AM  Readmission Risk Prevention Plan  Transportation Screening Complete  Medication Review (RN Care Manager) Complete  PCP or Specialist appointment within 3-5 days of discharge Complete  SW Recovery Care/Counseling Consult Complete  Palliative Care Screening Not Applicable  Skilled Nursing Facility Not Applicable

## 2023-10-16 NOTE — Plan of Care (Signed)
 The patient is alert and forgetful. Foley Catheter is patient and draining amber color urine. The patient has slept well tonight. No c/o pain or discomfort.

## 2023-10-16 NOTE — Progress Notes (Signed)
 Called report to [phone] and gave report to AMY,RN assuming care. Pts foley was left in place and IV was removed. Pts belongings sent with patient.

## 2023-10-16 NOTE — Discharge Summary (Signed)
 Jay Flores FMW:[id number] DOB: 1960-07-05 DOA: 10/08/2023  PCP: [name], MD  Admit date: 10/08/2023 Discharge date: 10/16/2023  Time spent: 35 minutes    Discharge Diagnoses:  Principal Problem:   AKI (acute kidney injury) (HCC) Active Problems:   Acute kidney injury superimposed on stage 3b chronic kidney disease (HCC)   Bilateral hydronephrosis   Bladder mass   DVT (deep venous thrombosis)_left leg   Pulmonary embolism (HCC)   HTN (hypertension)   Type II diabetes mellitus with renal manifestations (HCC)   HLD (hyperlipidemia)   HIV (human immunodeficiency virus infection) (HCC)   Anemia in chronic kidney disease (CKD)   Protein-calorie malnutrition, severe   Hepatitis C infection   Discharge Condition: stable  Diet recommendation: ad lib  Filed Weights   10/08/23 1342  Weight: 40.1 kg    History of present illness:  From admission h and p Jay Flores is a pleasant 63 y.o. male with medical history significant for diabetes, bladder cancer under the care of oncologist, hepatitis C, COPD, HIV, HTN, DVT/PE on Eliquis  who was brought in by ambulance from [facility] care facility group home for abnormal creatinine of 13.9, baseline creatinine of 3.  Patient also complained of abdominal pain, back pain which is 7/10 in intensity, constant, worsening with movement for the last 3 days.  Patient is not a good historian.  Patient is stated that his pain is around suprapubic area.  He was recently discharged from the hospital on 09/28/2023 where he was admitted for urinary retention.  Patient had a CT scan which showed distended bladder with bladder wall diverticulum.  Patient did undergo cystoscopy with clot evacuation on 09/19/2023.  Today patient's symptoms are very nonspecific except pain.  Patient had a blood work done which showed creatinine significantly elevated than before so he was sent to ED for evaluation for admission for AKI.    Hospital Course:   AKI  on CKDIV: 2/2 bladder cancer. Cr is trending up daily, in the setting of bladder cancer w/ bilateral hydronephrosis. Continue w/ foley. Highly unlikely that PCN will change very poor prognosis per urology, placement deferred. Not HD candidate as per nephro. High risk for further decline. Poor prognosis. This was discussed w/ pt's sister, Jay Flores, who verbalized her understanding. Palliative care is following and recs apprec. Ultimate decision to transition to comfort care at hospice home   Other problems included  Hyperkalemia: likely secondary to above.     Metabolic acidosis: secondary to AKI on CKDIV.     Bladder cancer: not a candidate for treatment. Cause of above obstructive AKI   Hx of DVT/PE:     HTN:     HLD:     DM2:     HIV:     HCV:    Procedures: none   Consultations: Urology, nephrology, palliative  Discharge Exam: Vitals:   10/16/23 0521 10/16/23 0743  BP: 131/80 111/62  Pulse: 91 81  Resp: 16 16  Temp: 98.4 F (36.9 C) 98.2 F (36.8 C)  SpO2: 100% 99%    General: cachectic Cardiovascular: rrr Respiratory: ctab Foley draining blood-tinged urine  Discharge Instructions   Discharge Instructions     Diet general   Complete by: As directed    Increase activity slowly   Complete by: As directed    No wound care   Complete by: As directed       Allergies as of 10/16/2023   No Known Allergies      Medication List  STOP taking these medications    Accu-Chek Aviva Plus test strip Generic drug: glucose blood   Accu-Chek Softclix Lancets lancets   apixaban  5 MG Tabs tablet Commonly known as: Eliquis    atorvastatin  20 MG tablet Commonly known as: LIPITOR   cholecalciferol  25 MCG (1000 UNIT) tablet Commonly known as: VITAMIN D3   finasteride  5 MG tablet Commonly known as: PROSCAR    folic acid  1 MG tablet Commonly known as: FOLVITE    FreeStyle Libre 14 Day Reader [employer] 14 Day Sensor Misc   Jardiance 25 MG  Tabs tablet Generic drug: empagliflozin   Juluca  50-25 MG tablet Generic drug: dolutegravir -rilpivirine    metFORMIN 500 MG tablet Commonly known as: GLUCOPHAGE   metoprolol  succinate 100 MG 24 hr tablet Commonly known as: TOPROL -XL   mirtazapine  7.5 MG tablet Commonly known as: REMERON    sodium bicarbonate  650 MG tablet   tamsulosin  0.4 MG Caps capsule Commonly known as: FLOMAX        TAKE these medications    loperamide  2 MG capsule Commonly known as: IMODIUM  Take 2 mg by mouth 4 (four) times daily as needed for diarrhea or loose stools.   oxyCODONE  5 MG immediate release tablet Commonly known as: Oxy IR/ROXICODONE  Take 1 tablet (5 mg total) by mouth every 4 (four) hours as needed for moderate pain (pain score 4-6).   sucralfate  1 g tablet Commonly known as: CARAFATE  Take 1 g by mouth 2 (two) times daily.   Tylenol  8 Hour Arthritis Pain 650 MG CR tablet Generic drug: acetaminophen  Take 650 mg by mouth every 4 (four) hours as needed for pain.   Vilazodone  HCl 20 MG Tabs Take 20 mg by mouth daily.       No Known Allergies  Follow-up Information     [name], MD Follow up.   Specialty: Infectious Diseases Why: hospital follow up Contact information: [address] [place] [address] [phone]                  The results of significant diagnostics from this hospitalization (including imaging, microbiology, ancillary and laboratory) are listed below for reference.    Significant Diagnostic Studies: CT Renal Stone Study Result Date: 10/08/2023 CLINICAL DATA:  Abdominal and flank pain EXAM: CT ABDOMEN AND PELVIS WITHOUT CONTRAST TECHNIQUE: Multidetector CT imaging of the abdomen and pelvis was performed following the standard protocol without IV contrast. RADIATION DOSE REDUCTION: This exam was performed according to the departmental dose-optimization program which includes automated exposure control, adjustment of the mA and/or  kV according to patient size and/or use of iterative reconstruction technique. COMPARISON:  CT abdomen and pelvis 09/16/2023. FINDINGS: Lower chest: There is scarring or atelectasis in the right lung base. Hepatobiliary: Gallstones are present. There is no biliary ductal dilatation. The liver is within normal limits. Pancreas: There are dense calcifications throughout the pancreas compatible with chronic pancreatitis similar to the prior study. Cystic change in the region of the pancreatic head/duodenal measures 2.5 cm and has slightly increased in size. No acute inflammatory change. Spleen: Normal in size without focal abnormality. Adrenals/Urinary Tract: The bilateral adrenal glands are within normal limits. There is moderate bilateral hydroureteronephrosis to the level of the bladder without obstructing calculus, similar to the prior study. The bladder is distended with superior bladder diverticulum containing air-fluid levels similar to the prior study. The bladder is filled with heterogeneous hyperdense material and air. The bladder wall appears irregular and thickened, particularly inferiorly and anteriorly. There also a  few punctate calcifications in the bladder. Foley catheter is present. Stomach/Bowel: Stomach is within normal limits. No evidence of bowel wall thickening, distention, or inflammatory changes. The appendix is not visualized. Scattered colonic diverticula are present. Vascular/Lymphatic: Aortic atherosclerosis. No enlarged abdominal or pelvic lymph nodes. Reproductive: Prostate gland is mildly enlarged. Other: There is no ascites or focal abdominal wall hernia. Musculoskeletal: No acute or significant osseous findings. IMPRESSION: 1. Moderate bilateral hydroureteronephrosis to the level of the bladder without obstructing calculus. The bladder is distended with heterogeneous hyperdense material and air. The bladder wall appears irregular and thickened, particularly inferiorly and anteriorly.  Findings are concerning for bladder neoplasm. 2. Cholelithiasis. 3. Chronic pancreatitis. 4. Cystic change in the region of the pancreatic head/duodenum has slightly increased in size. This can be further evaluated with MRI. 5. Colonic diverticulosis. 6. Aortic atherosclerosis. Aortic Atherosclerosis (ICD10-I70.0). Electronically Signed   By: Greig Pique M.D.   On: 10/08/2023 17:30   DG OR UROLOGY CYSTO IMAGE ([facility] ONLY) Result Date: 09/19/2023 There is no interpretation for this exam.  This order is for images obtained during a surgical procedure.  Please See Surgeries Tab for more information regarding the procedure.   US  RENAL Result Date: 09/18/2023 CLINICAL DATA:  Gross hematuria. EXAM: RENAL / URINARY TRACT ULTRASOUND COMPLETE COMPARISON:  None Available. FINDINGS: Right Kidney: Renal measurements: 9.1 cm x 5.2 cm x 6.0 cm = volume: 148 mL. Diffusely increased echogenicity of the renal parenchyma is seen. No mass is visualized. There is moderate severity right-sided hydronephrosis. Left Kidney: Renal measurements: 9.5 cm x 5.4 cm x 4.1 cm = volume: 111.6 mL. Diffusely increased echogenicity of the renal parenchyma is seen. 0.9 cm x 0.6 cm x 1.1 cm and 1.8 cm x 1.4 cm x 1.6 cm left renal cysts are noted. There is mild left-sided hydronephrosis. Bladder: A Foley catheter is in place. Large amount of air is seen within the bladder lumen. Other: None. IMPRESSION: 1. Bilateral echogenic kidneys which may represent sequelae associated with medical renal disease. 2. Small left renal cyst. 3. Bilateral hydronephrosis, right greater than left. 4. Large amount of air within the bladder lumen. Electronically Signed   By: Suzen Dials M.D.   On: 09/18/2023 20:55   CT ABDOMEN PELVIS WO CONTRAST Result Date: 09/16/2023 CLINICAL DATA:  Chest and abdominal pain EXAM: CT ABDOMEN AND PELVIS WITHOUT CONTRAST TECHNIQUE: Multidetector CT imaging of the abdomen and pelvis was performed following the standard  protocol without IV contrast. RADIATION DOSE REDUCTION: This exam was performed according to the departmental dose-optimization program which includes automated exposure control, adjustment of the mA and/or kV according to patient size and/or use of iterative reconstruction technique. COMPARISON:  08/16/2023 FINDINGS: Lower chest: Mild scarring is noted in the right lung base. Hepatobiliary: Cholelithiasis is seen without complicating factors. The liver appears within normal limits. Pancreas: Scattered calcifications are noted throughout the pancreas consistent with chronic pancreatitis. The previously seen cystic change adjacent to the head of the pancreas and second portion of the duodenum is again noted and stable measuring approximately 2 cm. The more peripheral pancreas appears within normal limits. Spleen: Normal in size without focal abnormality. Adrenals/Urinary Tract: Adrenal glands are within normal limits. Persistent hydronephrosis and hydroureter is noted bilateral likely related to an over distended bladder. Foley catheter is noted in place there is considerable mixed attenuation density within the bladder lumen consistent with air, urine and likely thrombus. Persistent bladder wall thickening is again noted with evidence of a superior bladder diverticulum. These  changes likely contribute to the degree of hydronephrosis. Stomach/Bowel: Scattered fecal material is noted throughout the colon. No obstructive or inflammatory changes of the colon are seen. The appendix is partially visualized and within normal limits. Small bowel and stomach appear within normal limits. Vascular/Lymphatic: Aortic atherosclerosis. No enlarged abdominal or pelvic lymph nodes. Previously seen lymph node adjacent to the rectum is no longer identified. Reproductive: Prostate is unremarkable. Other: No abdominal wall hernia or abnormality. No abdominopelvic ascites. Musculoskeletal: Bilateral L5 pars defects are noted without  significant anterolisthesis. No acute bony abnormality is noted. 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 Mixed echogenicity material is noted within the bladder representing air, urine and likely hyperdense thrombus. Correlate with Foley output. The over distention of the bladder contributes to the bilateral hydronephrosis which is similar to that seen on the prior exam. Diffuse bladder wall thickening is noted suspicious for underlying neoplasm. Correlate with clinical history. Cholelithiasis without complicating factors. Chronic pancreatitis with a 2 cm pancreatic pseudocyst in the region of the head stable from the prior study. Electronically Signed   By: Oneil Devonshire M.D.   On: 09/16/2023 20:25   DG Chest 1 View Result Date: 09/16/2023 CLINICAL DATA:  Chest pain. EXAM: CHEST  1 VIEW COMPARISON:  July 29, 2023 FINDINGS: The heart size and mediastinal contours are within normal limits. Mild, stable linear scarring and/or atelectasis is seen along the periphery of the right lung base. No acute infiltrate, pleural effusion or pneumothorax is identified. The visualized skeletal structures are unremarkable. IMPRESSION: Mild, stable right basilar linear scarring and/or atelectasis. Electronically Signed   By: Suzen Dials M.D.   On: 09/16/2023 14:51    Microbiology: Recent Results (from the past 240 hours)  Urine Culture     Status: Abnormal   Collection Time: 10/08/23  4:48 PM   Specimen: Urine, Random  Result Value Ref Range Status   Specimen Description   Final    URINE, RANDOM Performed at [facility], [address] [place], [place] [address]    Special Requests   Final    NONE Reflexed from [id number] Performed at [facility], [address] [place], [place] [address]    Culture >=100,000 COLONIES/mL ENTEROCOCCUS FAECALIS (A)  Final   Report Status 10/11/2023 FINAL  Final   Organism ID, Bacteria ENTEROCOCCUS FAECALIS (A)  Final       Susceptibility   Enterococcus faecalis - MIC*    AMPICILLIN  <=2 SENSITIVE Sensitive     NITROFURANTOIN <=16 SENSITIVE Sensitive     VANCOMYCIN 1 SENSITIVE Sensitive     * >=100,000 COLONIES/mL ENTEROCOCCUS FAECALIS     Labs: Basic Metabolic Panel: Recent Labs  Lab 10/12/23 0319 10/13/23 0249 10/14/23 0259 10/15/23 0331 10/16/23 0826  NA 132* 136 132* 136 135  K 4.7 4.7 4.9 4.9 4.8  CL 98 98 95* 93* 92*  CO2 18* 20* 19* 21* 21*  GLUCOSE 133* 132* 98 144* 156*  BUN 109* 120* 124* 144* 149*  CREATININE 16.44* 17.96* 17.66* 18.75* 19.53*  CALCIUM  8.5* 8.6* 8.6* 8.5* 8.7*   Liver Function Tests: Recent Labs  Lab 10/10/23 0838  AST 13*  ALT 8  ALKPHOS 70  BILITOT 0.5  PROT 6.0*  ALBUMIN  1.9*   No results for input(s): LIPASE, AMYLASE in the last 168 hours. No results for input(s): AMMONIA in the last 168 hours. CBC: Recent Labs  Lab 10/11/23 0331 10/12/23 0319 10/13/23 0249 10/14/23 0259 10/15/23 0331  WBC 10.3 10.7*  14.2* 15.0* 12.8*  HGB 7.5* 7.6* 7.7* 7.3* 7.7*  HCT 23.8* 24.0* 24.7* 23.1* 24.4*  MCV 86.2 87.0 86.7 85.9 87.5  PLT 236 248 264 247 236   Cardiac Enzymes: No results for input(s): CKTOTAL, CKMB, CKMBINDEX, TROPONINI in the last 168 hours. BNP: BNP (last 3 results) No results for input(s): BNP in the last 8760 hours.  ProBNP (last 3 results) No results for input(s): PROBNP in the last 8760 hours.  CBG: Recent Labs  Lab 10/15/23 1221 10/15/23 1635 10/15/23 2034 10/16/23 0833 10/16/23 1142  GLUCAP 155* 116* 127* 148* 162*       Signed:  Devaughn KATHEE Ban MD.  [facility] 10/16/2023, 1:35 PM

## 2023-11-06 DEATH — deceased

## 2023-11-11 ENCOUNTER — Other Ambulatory Visit

## 2023-11-18 ENCOUNTER — Ambulatory Visit: Admitting: Oncology

## 2023-11-18 ENCOUNTER — Ambulatory Visit
